# Patient Record
Sex: Female | Born: 1965 | Race: White | Hispanic: No | Marital: Single | Smoking: Never smoker
Health system: Southern US, Community
[De-identification: ages and names within clinical notes are randomized; demographics above are authoritative.]

## PROBLEM LIST (undated history)

## (undated) DIAGNOSIS — G4733 Obstructive sleep apnea (adult) (pediatric): Secondary | ICD-10-CM

## (undated) DIAGNOSIS — G8929 Other chronic pain: Secondary | ICD-10-CM

## (undated) DIAGNOSIS — E119 Type 2 diabetes mellitus without complications: Secondary | ICD-10-CM

## (undated) DIAGNOSIS — I10 Essential (primary) hypertension: Secondary | ICD-10-CM

## (undated) DIAGNOSIS — F319 Bipolar disorder, unspecified: Secondary | ICD-10-CM

## (undated) DIAGNOSIS — F32A Depression, unspecified: Secondary | ICD-10-CM

## (undated) DIAGNOSIS — N200 Calculus of kidney: Secondary | ICD-10-CM

## (undated) DIAGNOSIS — M793 Panniculitis, unspecified: Secondary | ICD-10-CM

## (undated) DIAGNOSIS — E662 Morbid (severe) obesity with alveolar hypoventilation: Secondary | ICD-10-CM

## (undated) DIAGNOSIS — F329 Major depressive disorder, single episode, unspecified: Secondary | ICD-10-CM

## (undated) HISTORY — PX: RIGHT OOPHORECTOMY: SHX2359

## (undated) HISTORY — PX: VENTRAL HERNIA REPAIR: SHX424

## (undated) HISTORY — PX: TRACHEOSTOMY: SUR1362

---

## 2014-01-22 ENCOUNTER — Emergency Department (HOSPITAL_COMMUNITY): Payer: Medicare Other

## 2014-01-22 ENCOUNTER — Other Ambulatory Visit: Payer: Self-pay

## 2014-01-22 ENCOUNTER — Emergency Department (HOSPITAL_COMMUNITY)
Admission: EM | Admit: 2014-01-22 | Discharge: 2014-01-23 | Disposition: A | Discharge status: Rehabilitation Facility | Payer: Medicare Other | Attending: Emergency Medicine | Admitting: Emergency Medicine

## 2014-01-22 ENCOUNTER — Encounter (HOSPITAL_COMMUNITY): Payer: Self-pay | Admitting: Emergency Medicine

## 2014-01-22 DIAGNOSIS — I10 Essential (primary) hypertension: Secondary | ICD-10-CM | POA: Insufficient documentation

## 2014-01-22 DIAGNOSIS — J9600 Acute respiratory failure, unspecified whether with hypoxia or hypercapnia: Secondary | ICD-10-CM | POA: Diagnosis not present

## 2014-01-22 DIAGNOSIS — G8929 Other chronic pain: Secondary | ICD-10-CM | POA: Insufficient documentation

## 2014-01-22 DIAGNOSIS — R059 Cough, unspecified: Secondary | ICD-10-CM | POA: Insufficient documentation

## 2014-01-22 DIAGNOSIS — J9611 Chronic respiratory failure with hypoxia: Secondary | ICD-10-CM

## 2014-01-22 DIAGNOSIS — F319 Bipolar disorder, unspecified: Secondary | ICD-10-CM | POA: Insufficient documentation

## 2014-01-22 DIAGNOSIS — Z79899 Other long term (current) drug therapy: Secondary | ICD-10-CM | POA: Insufficient documentation

## 2014-01-22 DIAGNOSIS — E119 Type 2 diabetes mellitus without complications: Secondary | ICD-10-CM | POA: Insufficient documentation

## 2014-01-22 DIAGNOSIS — Z794 Long term (current) use of insulin: Secondary | ICD-10-CM | POA: Insufficient documentation

## 2014-01-22 DIAGNOSIS — J9602 Acute respiratory failure with hypercapnia: Secondary | ICD-10-CM

## 2014-01-22 DIAGNOSIS — F3289 Other specified depressive episodes: Secondary | ICD-10-CM | POA: Insufficient documentation

## 2014-01-22 DIAGNOSIS — Z885 Allergy status to narcotic agent status: Secondary | ICD-10-CM | POA: Insufficient documentation

## 2014-01-22 DIAGNOSIS — F329 Major depressive disorder, single episode, unspecified: Secondary | ICD-10-CM | POA: Insufficient documentation

## 2014-01-22 DIAGNOSIS — E662 Morbid (severe) obesity with alveolar hypoventilation: Secondary | ICD-10-CM | POA: Insufficient documentation

## 2014-01-22 DIAGNOSIS — Z792 Long term (current) use of antibiotics: Secondary | ICD-10-CM | POA: Insufficient documentation

## 2014-01-22 DIAGNOSIS — Z87442 Personal history of urinary calculi: Secondary | ICD-10-CM | POA: Insufficient documentation

## 2014-01-22 DIAGNOSIS — J9612 Chronic respiratory failure with hypercapnia: Secondary | ICD-10-CM

## 2014-01-22 DIAGNOSIS — J9601 Acute respiratory failure with hypoxia: Secondary | ICD-10-CM

## 2014-01-22 DIAGNOSIS — R4182 Altered mental status, unspecified: Secondary | ICD-10-CM | POA: Insufficient documentation

## 2014-01-22 DIAGNOSIS — R0902 Hypoxemia: Secondary | ICD-10-CM | POA: Insufficient documentation

## 2014-01-22 DIAGNOSIS — G9340 Encephalopathy, unspecified: Secondary | ICD-10-CM | POA: Diagnosis not present

## 2014-01-22 DIAGNOSIS — Z881 Allergy status to other antibiotic agents status: Secondary | ICD-10-CM | POA: Insufficient documentation

## 2014-01-22 DIAGNOSIS — R000 Tachycardia, unspecified: Secondary | ICD-10-CM | POA: Insufficient documentation

## 2014-01-22 DIAGNOSIS — Z430 Encounter for attention to tracheostomy: Secondary | ICD-10-CM | POA: Insufficient documentation

## 2014-01-22 DIAGNOSIS — R062 Wheezing: Secondary | ICD-10-CM | POA: Insufficient documentation

## 2014-01-22 DIAGNOSIS — R05 Cough: Secondary | ICD-10-CM | POA: Insufficient documentation

## 2014-01-22 DIAGNOSIS — G4733 Obstructive sleep apnea (adult) (pediatric): Secondary | ICD-10-CM | POA: Insufficient documentation

## 2014-01-22 DIAGNOSIS — R0602 Shortness of breath: Secondary | ICD-10-CM | POA: Insufficient documentation

## 2014-01-22 DIAGNOSIS — Z7901 Long term (current) use of anticoagulants: Secondary | ICD-10-CM | POA: Insufficient documentation

## 2014-01-22 DIAGNOSIS — M793 Panniculitis, unspecified: Secondary | ICD-10-CM | POA: Insufficient documentation

## 2014-01-22 DIAGNOSIS — R0681 Apnea, not elsewhere classified: Secondary | ICD-10-CM | POA: Insufficient documentation

## 2014-01-22 HISTORY — DX: Other chronic pain: G89.29

## 2014-01-22 HISTORY — DX: Morbid (severe) obesity with alveolar hypoventilation: E66.2

## 2014-01-22 HISTORY — DX: Morbid (severe) obesity due to excess calories: E66.01

## 2014-01-22 HISTORY — DX: Bipolar disorder, unspecified: F31.9

## 2014-01-22 HISTORY — DX: Major depressive disorder, single episode, unspecified: F32.9

## 2014-01-22 HISTORY — DX: Essential (primary) hypertension: I10

## 2014-01-22 HISTORY — DX: Obstructive sleep apnea (adult) (pediatric): G47.33

## 2014-01-22 HISTORY — DX: Depression, unspecified: F32.A

## 2014-01-22 HISTORY — DX: Type 2 diabetes mellitus without complications: E11.9

## 2014-01-22 HISTORY — DX: Panniculitis, unspecified: M79.3

## 2014-01-22 HISTORY — DX: Calculus of kidney: N20.0

## 2014-01-22 LAB — COMPREHENSIVE METABOLIC PANEL
ALK PHOS: 52 U/L (ref 39–117)
ALT: 17 U/L (ref 0–35)
AST: 17 U/L (ref 0–37)
Albumin: 2.5 g/dL — ABNORMAL LOW (ref 3.5–5.2)
BUN: 8 mg/dL (ref 6–23)
CO2: 29 meq/L (ref 19–32)
Calcium: 8.7 mg/dL (ref 8.4–10.5)
Chloride: 96 mEq/L (ref 96–112)
Creatinine, Ser: 0.94 mg/dL (ref 0.50–1.10)
GFR, EST AFRICAN AMERICAN: 81 mL/min — AB (ref >90)
GFR, EST NON AFRICAN AMERICAN: 70 mL/min — AB (ref >90)
GLUCOSE: 223 mg/dL — AB (ref 70–99)
POTASSIUM: 4.2 meq/L (ref 3.7–5.3)
SODIUM: 138 meq/L (ref 137–147)
Total Bilirubin: 0.4 mg/dL (ref 0.3–1.2)
Total Protein: 6.6 g/dL (ref 6.0–8.3)

## 2014-01-22 LAB — CBC WITH DIFFERENTIAL/PLATELET
Basophils Absolute: 0 10*3/uL (ref 0.0–0.1)
Basophils Relative: 0 % (ref 0–1)
Eosinophils Absolute: 0.1 10*3/uL (ref 0.0–0.7)
Eosinophils Relative: 2 % (ref 0–5)
HCT: 37.6 % (ref 36.0–46.0)
Hemoglobin: 11.6 g/dL — ABNORMAL LOW (ref 12.0–15.0)
LYMPHS ABS: 1.6 10*3/uL (ref 0.7–4.0)
Lymphocytes Relative: 19 % (ref 12–46)
MCH: 30 pg (ref 26.0–34.0)
MCHC: 30.9 g/dL (ref 30.0–36.0)
MCV: 97.2 fL (ref 78.0–100.0)
Monocytes Absolute: 1 10*3/uL (ref 0.1–1.0)
Monocytes Relative: 12 % (ref 3–12)
NEUTROS PCT: 67 % (ref 43–77)
Neutro Abs: 5.8 10*3/uL (ref 1.7–7.7)
PLATELETS: 188 10*3/uL (ref 150–400)
RBC: 3.87 MIL/uL (ref 3.87–5.11)
RDW: 18 % — ABNORMAL HIGH (ref 11.5–15.5)
WBC: 8.5 10*3/uL (ref 4.0–10.5)

## 2014-01-22 LAB — I-STAT ARTERIAL BLOOD GAS, ED
ACID-BASE EXCESS: 6 mmol/L — AB (ref 0.0–2.0)
ACID-BASE EXCESS: 10 mmol/L — AB (ref 0.0–2.0)
BICARBONATE: 36.1 meq/L — AB (ref 20.0–24.0)
Bicarbonate: 38 mEq/L — ABNORMAL HIGH (ref 20.0–24.0)
O2 SAT: 100 %
O2 Saturation: 92 %
PO2 ART: 237 mmHg — AB (ref 80.0–100.0)
Patient temperature: 99
TCO2: 39 mmol/L (ref 0–100)
TCO2: 40 mmol/L (ref 0–100)
pCO2 arterial: 85.4 mmHg (ref 35.0–45.0)
pCO2 arterial: 73.8 mmHg (ref 35.0–45.0)
pH, Arterial: 7.236 — ABNORMAL LOW (ref 7.350–7.450)
pH, Arterial: 7.321 — ABNORMAL LOW (ref 7.350–7.450)
pO2, Arterial: 80 mmHg (ref 80.0–100.0)

## 2014-01-22 LAB — CBG MONITORING, ED: Glucose-Capillary: 179 mg/dL — ABNORMAL HIGH (ref 70–99)

## 2014-01-22 NOTE — ED Notes (Signed)
Pt now opening her eyes and is able to shake her head yes and no to questions asked.

## 2014-01-22 NOTE — ED Provider Notes (Signed)
CSN: [id number]     Arrival date & time 01/22/14  1913 History   First MD Initiated Contact with Patient 01/22/14 1923     Chief Complaint  Patient presents with  . Respiratory Distress     (Consider location/radiation/quality/duration/timing/severity/associated sxs/prior Treatment) Patient is a 48 y.o. female presenting with altered mental status. The history is provided by the EMS personnel.  Altered Mental Status Presenting symptoms: lethargy, partial responsiveness and unresponsiveness   Severity:  Severe Most recent episode:  Today Episode history:  Single Duration:  5 minutes Timing:  Constant Progression:  Worsening Chronicity:  New Context comment:  Trach collar, chronic respiratory failure Associated symptoms: difficulty breathing   Associated symptoms: no fever and no vomiting     No past medical history on file. No past surgical history on file. No family history on file. History  Substance Use Topics  . Smoking status: Not on file  . Smokeless tobacco: Not on file  . Alcohol Use: Not on file   OB History   No data available     Review of Systems  Unable to perform ROS: Severe respiratory distress  Constitutional: Negative for fever.  Respiratory: Positive for apnea, cough and shortness of breath.   Gastrointestinal: Negative for vomiting.      Allergies  Review of patient's allergies indicates not on file.  Home Medications   Prior to Admission medications   Not on File   BP 137/83  Pulse 113  Temp(Src) 99 F (37.2 C) (Axillary)  Resp 20  Ht 5\' 2"  (1.575 m)  Wt 550 lb (249.478 kg)  BMI 100.57 kg/m2  SpO2 94% Physical Exam  Constitutional: She appears toxic. She appears distressed.  Obese  HENT:  Head: Normocephalic and atraumatic.  Eyes: Conjunctivae are normal.  Cardiovascular: Regular rhythm.  Tachycardia present.   Pulmonary/Chest: Apnea and bradypnea noted. She is in respiratory distress. She has decreased breath sounds (difficult  to examine). She has wheezes. She has rhonchi.  Abdominal:    Chronic right lower abdominal wound  Neurological: She is unresponsive. GCS eye subscore is 1. GCS verbal subscore is 1. GCS motor subscore is 3.  Skin: There is erythema (overlying chronic wounds).    ED Course  Procedures (including critical care time) Labs Review Labs Reviewed  CBC WITH DIFFERENTIAL - Abnormal; Notable for the following:    Hemoglobin 11.6 (*)    RDW 18.0 (*)    All other components within normal limits  COMPREHENSIVE METABOLIC PANEL - Abnormal; Notable for the following:    Glucose, Bld 223 (*)    Albumin 2.5 (*)    GFR calc non Af Amer 70 (*)    GFR calc Af Amer 81 (*)    All other components within normal limits  CBG MONITORING, ED - Abnormal; Notable for the following:    Glucose-Capillary 179 (*)    All other components within normal limits  I-STAT ARTERIAL BLOOD GAS, ED - Abnormal; Notable for the following:    pH, Arterial 7.236 (*)    pCO2 arterial 85.4 (*)    Bicarbonate 36.1 (*)    Acid-Base Excess 6.0 (*)    All other components within normal limits  I-STAT ARTERIAL BLOOD GAS, ED - Abnormal; Notable for the following:    pH, Arterial 7.321 (*)    pCO2 arterial 73.8 (*)    pO2, Arterial 237.0 (*)    Bicarbonate 38.0 (*)    Acid-Base Excess 10.0 (*)    All other components within  normal limits  BLOOD GAS, ARTERIAL    Imaging Review Dg Chest Portable 1 View  01/22/2014   CLINICAL DATA:  Shortness of breath and hypoxia. Tracheostomy. Morbidly obese.  EXAM: PORTABLE CHEST - 1 VIEW  COMPARISON:  Chest radiograph 01/19/2014  FINDINGS: Image quality limited by portable technique and patient body habitus. Numerous cardiac leads project over chest. Tracheostomy appears stable, with the distal tip beyond the thoracic inlet and well above the carina. Left upper extremity opaque projects over the proximal superior vena cava, near its junction with the left brachycephalic vein.  Cardiomegaly  appears stable. There is diffuse pulmonary vascular congestion. There is interstitial prominence bilaterally, which may be accentuated by radiographic technique. No focal airspace disease or definite pleural effusion. No visible pneumothorax.  IMPRESSION: Image quality limited by portable technique and patient body habitus.  Cardiomegaly can't pulmonary vascular congestion. Cannot exclude mild interstitial pulmonary edema.  Left upper extremity PICC terminates in the proximal superior vena cava.   Electronically Signed   By: Curlene Dolphin M.D.   On: 01/22/2014 20:05     EKG Interpretation None      MDM   Final diagnoses:  Acute respiratory failure with hypoxia and hypercarbia  Altered mental status    48 y.o. female presents en route to long term care facility and while passing through the emergency department went into acute respiratory failure. According to EMS report the Pt was talking and gesturing en route, asked for a napkin and coughed, requested suctioning, then became altered and unresponsive. On initial assessment at bedside Pt effectively apneic, gray, unresponsive, still had palpable pulse. Bagged with undetectable pulse oximetry that first registered at 71% and slowly [place] to 90s, Pt's color improved, persistently altered. CXR without evidence of pneumothorax and possible edema, limited by habitus. Blood gas shows CO2 of 85, Pt placed on rate to increase ventilation. Per transfer documentation Pt was transitioned to trach collar 16 hours ago in preparation for transfer. Possible Pt went into decompensated hypercarbic respiratory failure, had prolonged obstruction of trach, or had other unrelated contributing event. Head CT ordered due to persistent alteration in mental status and for baseline imaging with possibility of anoxic brain injury in setting of unknown duration of apnea. Unable to obtain as Pt will not fit in scanner. Critical care has evaluated Pt in the ED and while ventilating  has had progressively improving mental status. Suspect delayed resolution of transient hypoxic episode. Will discharge to long term care facility where same critical care team will be following along.     Leo Grosser, MD 01/23/14 [id number]  Leo Grosser, MD 01/23/14 [phone]

## 2014-01-22 NOTE — Consult Note (Signed)
PULMONARY / CRITICAL CARE MEDICINE   Name: Kim Jenkins MRN: [id number] DOB: 09/22/1965    ADMISSION DATE:  01/22/2014 CONSULTATION DATE:  12/23/13  REFERRING MD :  EDP PRIMARY SERVICE: [facility]?  CHIEF COMPLAINT:  Hypoxia  BRIEF PATIENT DESCRIPTION: 48 y.o. F with extreme morbid obesity, is trach dependent, was being brought as a transfer to [facility].  Upon arrival to [facility] facility, while passing through the ED halls, pt had mucus plug causing temporary decrease in SpO2 (71%) with brief episode of apnea.  PCCM consulted for recs.  SIGNIFICANT EVENTS / STUDIES:  LINES / TUBES: Trach (? Date) >>> Foley (? Date) >>> Left arm PICC (? Date) >>>  CULTURES: None  ANTIBIOTICS: None  HISTORY OF PRESENT ILLNESS:  Pt is trach dependent, on full vent support currently and unable to communicate, therefore this HPI is obtained from limited ED notes in EPIC system. Kim Jenkins is a 48 y.o. F with extreme morbid obesity and PMH as indicated below.  She was being transferred to [facility] (from [place]?) on 5/7.  Per ED notes, while pt was in ambulance, she asked for a napkin and coughed up some mucus, then indicated to EMS that she needed to be suctioned.  While passing through the [facility] ED halls en route to [facility], she became apneic and unresponsive.  SpO2 at the time was initially 71%, pt was bagged by ED resident and sats then increased into 90's and pt became responsive again.  No loss of pulses throughout episode mentioned.  CXR was obtained and revealed cardiomegaly with possible pulmonary edema, though note that exam limited due to pt body habitus.  Initial ABG  7.23/85/80.  Pt put on full vent support and ABG 2 hours later 7.32/73/237.  Vent adjusted accordingly by RT. Head CT was ordered to r/o anoxic injury; however, pt would not fit in CT scanner. PCCM was asked to evaluate the pt for possible admission/monitoring before moving to [facility]. During my exam, pt responded to verbal stimuli.  Was able to nod her head and  indicated that she was not currently having any difficulty breathing and did not feel like she needed further suctioning.  She denied any pain or other symptoms.  PAST MEDICAL HISTORY :  Past Medical History  Diagnosis Date  . Depression   . Morbid obesity   . Bipolar 1 disorder   . Diabetes mellitus   . Hypertension   . Chronic pain   . Panniculitis   . Obstructive sleep apnea   . Obesity hypoventilation syndrome   . Kidney stones    Past Surgical History  Procedure Laterality Date  . Tracheostomy    . Right oophorectomy    . Ventral hernia repair     Prior to Admission medications   Medication Sig Start Date End Date Taking? Authorizing Provider  albuterol (PROVENTIL HFA;VENTOLIN HFA) 108 (90 BASE) MCG/ACT inhaler Inhale 2 puffs into the lungs every 6 (six) hours as needed for wheezing or shortness of breath.   Yes Historical Provider, MD  buPROPion (WELLBUTRIN) 100 MG tablet Take 100 mg by mouth every morning.   Yes Historical Provider, MD  butalbital-acetaminophen-caffeine (FIORICET, ESGIC) 50-325-40 MG per tablet Take 1 tablet by mouth every 4 (four) hours as needed for headache.   Yes Historical Provider, MD  Calcium Carbonate-Vit D-Min (CALCIUM 600+D PLUS MINERALS) 600-400 MG-UNIT TABS Take 1 tablet by mouth 2 (two) times daily.   Yes Historical Provider, MD  clonazePAM (KLONOPIN) 0.5 MG tablet Take 0.5 mg by  mouth every 12 (twelve) hours.   Yes Historical Provider, MD  cyclobenzaprine (FLEXERIL) 5 MG tablet Take 5 mg by mouth 3 (three) times daily.   Yes Historical Provider, MD  divalproex (DEPAKOTE ER) 250 MG 24 hr tablet Take 250 mg by mouth at bedtime.   Yes Historical Provider, MD  Divalproex Sodium (DEPAKOTE PO) Take 625 mg by mouth 2 (two) times daily.   Yes Historical Provider, MD  doxycycline 100 mg in dextrose 5 % 250 mL Inject 100 mg into the vein every 12 (twelve) hours.   Yes Historical Provider, MD  DULoxetine (CYMBALTA) 30 MG capsule Take 30 mg by mouth 2 (two)  times daily.   Yes Historical Provider, MD  enoxaparin (LOVENOX) 40 MG/0.4ML injection Inject 40 mg into the skin every 12 (twelve) hours.   Yes Historical Provider, MD  famotidine (PEPCID) 40 MG tablet Take 40 mg by mouth at bedtime.   Yes Historical Provider, MD  fluconazole (DIFLUCAN) 200 MG/100ML IVPB Inject 200 mg into the vein daily.   Yes Historical Provider, MD  Fluticasone-Salmeterol (ADVAIR) 250-50 MCG/DOSE AEPB Inhale 1 puff into the lungs 2 (two) times daily.   Yes Historical Provider, MD  gabapentin (NEURONTIN) 600 MG tablet Take 600 mg by mouth 3 (three) times daily.   Yes Historical Provider, MD  insulin detemir (LEVEMIR) 100 UNIT/ML injection Inject 70 Units into the skin 2 (two) times daily.   Yes Historical Provider, MD  loratadine (CLARITIN) 10 MG tablet Take 10 mg by mouth daily as needed for allergies.   Yes Historical Provider, MD  metoprolol tartrate (LOPRESSOR) 25 MG tablet Take 12.5 mg by mouth 2 (two) times daily.   Yes Historical Provider, MD  oxyCODONE (OXY IR/ROXICODONE) 5 MG immediate release tablet Take 5 mg by mouth every 4 (four) hours as needed for moderate pain (for pain level 1>5).   Yes Historical Provider, MD  Oxycodone HCl 10 MG TABS Take 10 mg by mouth every 6 (six) hours as needed (for pain level 5>10).   Yes Historical Provider, MD  polyethylene glycol (MIRALAX / GLYCOLAX) packet Take 17 g by mouth daily.   Yes Historical Provider, MD  promethazine 25 mg in sodium chloride 0.9 % 1,000 mL Inject 12.5 mg into the vein every 6 (six) hours as needed (for nausea).   Yes Historical Provider, MD  senna (SENOKOT) 8.6 MG tablet Take 1 tablet by mouth 2 (two) times daily.   Yes Historical Provider, MD  spironolactone (ALDACTONE) 25 MG tablet Take 25 mg by mouth every morning.   Yes Historical Provider, MD  traZODone (DESYREL) 50 MG tablet Take 75 mg by mouth at bedtime.   Yes Historical Provider, MD  Vancomycin HCl in Dextrose (VANCOCIN HCL IV) Inject 2,000 mg into the  vein every 12 (twelve) hours.   Yes Historical Provider, MD  zolpidem (AMBIEN) 5 MG tablet Take 5 mg by mouth at bedtime as needed for sleep.   Yes Historical Provider, MD   Allergies  Allergen Reactions  . Cefazolin   . Meperidine And Related     FAMILY HISTORY:  No family history on file. SOCIAL HISTORY:  has no tobacco, alcohol, and drug history on file.  REVIEW OF SYSTEMS:   All negative; except for those that are bolded, which indicate positives.  Constitutional: weight loss, gain, night sweats, Fevers, chills, fatigue .  HEENT: headaches, Sore throat, sneezing, nasal congestion, post nasal drip, Difficulty swallowing, Tooth/dental problems, visual complaints visual changes, ear ache CV:  chest  pain,Orthopnea, PND, swelling in lower extremities, dizziness, palpitations, syncope.  GI  heartburn, indigestion, abdominal pain, nausea, vomiting, diarrhea, change in bowel habits, loss of appetite, bloody stools.  Resp: cough, productive: , hemoptysis, dyspnea, chest pain, pleuritic. Skin: rash or itching or icterus GU: dysuria, change in color of urine, urgency or frequency. flank pain, hematuria  MS: joint pain or swelling. decreased range of motion  Psych: change in mood or affect. depression or anxiety.  Neuro: difficulty with speech, weakness, numbness, ataxia    SUBJECTIVE: Informs me that she is not having any SOB, chest pain.  VITAL SIGNS: Temp:  [99 F (37.2 C)] 99 F (37.2 C) (05/07 1924) Pulse Rate:  [86-132] 86 (05/07 2230) Resp:  [0-20] 0 (05/07 2230) BP: (108-158)/(60-89) 109/89 mmHg (05/07 2230) SpO2:  [77 %-100 %] 100 % (05/07 2230) FiO2 (%):  [50 %] 50 % (05/07 2030) Weight:  [240.406 kg (530 lb)] 240.406 kg (530 lb) (05/07 1924) HEMODYNAMICS:   VENTILATOR SETTINGS: Vent Mode:  [-] PRVC FiO2 (%):  [50 %] 50 % Set Rate:  [18 bmp] 18 bmp Vt Set:  [440 mL] 440 mL PEEP:  [5 cmH20] 5 cmH20 Pressure Support:  [10 cmH20] 10 cmH20 Plateau Pressure:  [27 cmH20]  27 cmH20 INTAKE / OUTPUT: Intake/Output   None     PHYSICAL EXAMINATION: General: morbidly obese female, resting in bed, trach on full vent support, in NAD. Neuro: MAE's.  Unable to talk as is trached and on vent.  Nods her head to answer questions. HEENT: [place]/AT. PERRL, sclerae anicteric. #6 Trach. Cardiovascular: RRR though distant, no M/R/G appreciated with respect to body habitus. Lungs: Respirations even on full vent support.  Scattered wheezes.  No obvious rhonchi/rales with respect to body habitus. Abdomen: morbid obesity, BS not heard with respect to body habitus, soft, NT/ND.  Musculoskeletal: No gross deformities, edema and chronic (per pt) erythema to both LE's. Skin: Intact, warm, no rashes.    LABS: -r eviewed by staff MD and updated at 0.25 hours 01/23/14  PULMONARY  Recent Labs Lab 01/22/14 2021 01/22/14 2238  PHART 7.236* 7.321*  PCO2ART 85.4* 73.8*  PO2ART 80.0 237.0*  HCO3 36.1* 38.0*  TCO2 39 40  O2SAT 92.0 100.0    CBC  Recent Labs Lab 01/22/14 2004  HGB 11.6*  HCT 37.6  WBC 8.5  PLT 188    COAGULATION No results found for this basename: INR,  in the last 168 hours  CARDIAC  No results found for this basename: TROPONINI,  in the last 168 hours No results found for this basename: PROBNP,  in the last 168 hours   CHEMISTRY  Recent Labs Lab 01/22/14 2004  NA 138  K 4.2  CL 96  CO2 29  GLUCOSE 223*  BUN 8  CREATININE 0.94  CALCIUM 8.7   Estimated Creatinine Clearance: 144.2 ml/min (by C-G formula based on Cr of 0.94).   LIVER  Recent Labs Lab 01/22/14 2004  AST 17  ALT 17  ALKPHOS 52  BILITOT 0.4  PROT 6.6  ALBUMIN 2.5*     INFECTIOUS No results found for this basename: LATICACIDVEN, PROCALCITON,  in the last 168 hours   ENDOCRINE CBG (last 3)   Recent Labs  01/22/14 2000  GLUCAP 179*         IMAGING x48h  Dg Chest Portable 1 View  01/22/2014   CLINICAL DATA:  Shortness of breath and hypoxia.  Tracheostomy. Morbidly obese.  EXAM: PORTABLE CHEST - 1 VIEW  COMPARISON:  Chest radiograph 01/19/2014  FINDINGS: Image quality limited by portable technique and patient body habitus. Numerous cardiac leads project over chest. Tracheostomy appears stable, with the distal tip beyond the thoracic inlet and well above the carina. Left upper extremity opaque projects over the proximal superior vena cava, near its junction with the left brachycephalic vein.  Cardiomegaly appears stable. There is diffuse pulmonary vascular congestion. There is interstitial prominence bilaterally, which may be accentuated by radiographic technique. No focal airspace disease or definite pleural effusion. No visible pneumothorax.  IMPRESSION: Image quality limited by portable technique and patient body habitus.  Cardiomegaly can't pulmonary vascular congestion. Cannot exclude mild interstitial pulmonary edema.  Left upper extremity PICC terminates in the proximal superior vena cava.   Electronically Signed   By: Curlene Dolphin M.D.   On: 01/22/2014 20:05      ASSESSMENT / PLAN:  Tracheostomy Status - Acute on Chronic Respiratory Failure VDRF s/p mucus plug - per ED notes, pt was initially on trach collar, then switched to full vent support during episode of apnea. Acute on Chronic Respiratory Acidosis with acute encephalopathy- improved on 2nd ABG  Hx of OSA/OHS - hypercarbia noted on ABG x 2, improving.  Recs: - Full vent support, wean as able - goal ATC. However, should never have trach removed in setting of morbid obesity - VAP Bundle. - Trend ABG. - CXR in AM. - Duonebs scheduled q6h. - Albuterol q3h PRN. - Caution with chronic pain meds given concern for depressed respiratory drive. - Pt seems stable for transfer to [facility] - Attending to follow.   Montey Hora, [place] [facility] Pgr: [phone]  or [phone]  01/22/2014, 11:14 PM   STAFF NOTE: I, Dr Ann Lions have  personally reviewed patient's available data, including medical history, events of note, physical examination and test results as part of my evaluation. I have discussed with resident/NP and other care providers such as pharmacist, RN and RRT.  In addition,  I personally evaluated patient and elicited key findings of acute on chronic respiratory failure s/p trach in setting of morbid obesity. Had acute hypercapnia due to mucus plugging and altered mental status in ER. Ultimately resolved with bagging. STable to go to [facility] from ER for chronic critical illness needs; needs do not appear acute.  Rest per NP/medical resident whose note is outlined above and that I agree with  The patient is critically ill with multiple organ systems failure and requires high complexity decision making for assessment and support, frequent evaluation and titration of therapies, application of advanced monitoring technologies and extensive interpretation of multiple databases.   Critical Care Time devoted to patient care services described in this note is  35  Minutes.  Dr. Brand Males, M.D., [facility].C.P Pulmonary and Critical Care Medicine Staff Physician [place] [facility] Pager: [phone], If no answer or between  15:00h - 7:00h: call [phone]  01/23/2014 12:26 AM

## 2014-01-22 NOTE — Progress Notes (Signed)
MD & RN made aware of critical value ABG results (CO2: 85) New orders received to changed pt. To full support.

## 2014-01-22 NOTE — ED Notes (Signed)
Unable to perform CT as pt is too large to fit scanner, Dr. Venora Maples EDP made aware.

## 2014-01-22 NOTE — ED Notes (Signed)
Pt transferred to bariatric hospital bed for comfort.

## 2014-01-22 NOTE — ED Notes (Signed)
Per [facility] - pt was to be a direct admit to this facility, on arrival pt requested suctioning of her tracheostomy - pt experienced a period of unresponsiveness shortly after, RT to bedside for trach suctioning, pt the bagged by MD resident at bedside d/t minimal respiratory effort of pt. Once on cardiac monitor pt noted to have pulse ox of 78% while being bagged - O2 increased to 94% on vent.

## 2014-01-23 ENCOUNTER — Other Ambulatory Visit (HOSPITAL_COMMUNITY): Payer: Medicare Other

## 2014-01-23 ENCOUNTER — Encounter (HOSPITAL_COMMUNITY): Payer: Self-pay | Admitting: Emergency Medicine

## 2014-01-23 ENCOUNTER — Inpatient Hospital Stay
Admission: AD | Admit: 2014-01-23 | Discharge: 2014-02-20 | Disposition: A | Discharge status: Skilled Nursing Facility | Payer: Medicare Other | Source: Ambulatory Visit | Attending: Internal Medicine | Admitting: Internal Medicine

## 2014-01-23 DIAGNOSIS — J9612 Chronic respiratory failure with hypercapnia: Secondary | ICD-10-CM

## 2014-01-23 DIAGNOSIS — J9611 Chronic respiratory failure with hypoxia: Secondary | ICD-10-CM

## 2014-01-23 DIAGNOSIS — G9340 Encephalopathy, unspecified: Secondary | ICD-10-CM

## 2014-01-23 DIAGNOSIS — J9620 Acute and chronic respiratory failure, unspecified whether with hypoxia or hypercapnia: Secondary | ICD-10-CM

## 2014-01-23 DIAGNOSIS — Z930 Tracheostomy status: Secondary | ICD-10-CM

## 2014-01-23 LAB — BLOOD GAS, ARTERIAL
Acid-Base Excess: 9.3 mmol/L — ABNORMAL HIGH (ref 0.0–2.0)
Bicarbonate: 35.6 mEq/L — ABNORMAL HIGH (ref 20.0–24.0)
FIO2: 0.4 %
O2 Saturation: 97 %
PATIENT TEMPERATURE: 98.6
PCO2 ART: 73.7 mmHg — AB (ref 35.0–45.0)
PH ART: 7.305 — AB (ref 7.350–7.450)
TCO2: 37.8 mmol/L (ref 0–100)
pO2, Arterial: 98.4 mmHg (ref 80.0–100.0)

## 2014-01-23 LAB — COMPREHENSIVE METABOLIC PANEL
ALT: 14 U/L (ref 0–35)
AST: 11 U/L (ref 0–37)
Albumin: 2.3 g/dL — ABNORMAL LOW (ref 3.5–5.2)
Alkaline Phosphatase: 47 U/L (ref 39–117)
BILIRUBIN TOTAL: 0.3 mg/dL (ref 0.3–1.2)
BUN: 9 mg/dL (ref 6–23)
CO2: 34 meq/L — AB (ref 19–32)
Calcium: 9 mg/dL (ref 8.4–10.5)
Chloride: 94 mEq/L — ABNORMAL LOW (ref 96–112)
Creatinine, Ser: 0.88 mg/dL (ref 0.50–1.10)
GFR calc Af Amer: 88 mL/min — ABNORMAL LOW (ref >90)
GFR, EST NON AFRICAN AMERICAN: 76 mL/min — AB (ref >90)
Glucose, Bld: 121 mg/dL — ABNORMAL HIGH (ref 70–99)
POTASSIUM: 4.1 meq/L (ref 3.7–5.3)
SODIUM: 140 meq/L (ref 137–147)
Total Protein: 6.3 g/dL (ref 6.0–8.3)

## 2014-01-23 LAB — PHOSPHORUS: PHOSPHORUS: 2.9 mg/dL (ref 2.3–4.6)

## 2014-01-23 LAB — SEDIMENTATION RATE: SED RATE: 75 mm/h — AB (ref 0–22)

## 2014-01-23 LAB — VITAMIN B12: VITAMIN B 12: 632 pg/mL (ref 211–911)

## 2014-01-23 LAB — TSH: TSH: 0.928 u[IU]/mL (ref 0.350–4.500)

## 2014-01-23 LAB — CBC WITH DIFFERENTIAL/PLATELET
BASOS ABS: 0 10*3/uL (ref 0.0–0.1)
Basophils Relative: 0 % (ref 0–1)
Eosinophils Absolute: 0.1 10*3/uL (ref 0.0–0.7)
Eosinophils Relative: 2 % (ref 0–5)
HCT: 34.5 % — ABNORMAL LOW (ref 36.0–46.0)
Hemoglobin: 10.6 g/dL — ABNORMAL LOW (ref 12.0–15.0)
Lymphocytes Relative: 25 % (ref 12–46)
Lymphs Abs: 1.5 10*3/uL (ref 0.7–4.0)
MCH: 29.6 pg (ref 26.0–34.0)
MCHC: 30.7 g/dL (ref 30.0–36.0)
MCV: 96.4 fL (ref 78.0–100.0)
Monocytes Absolute: 0.9 10*3/uL (ref 0.1–1.0)
Monocytes Relative: 15 % — ABNORMAL HIGH (ref 3–12)
Neutro Abs: 3.6 10*3/uL (ref 1.7–7.7)
Neutrophils Relative %: 58 % (ref 43–77)
Platelets: 181 10*3/uL (ref 150–400)
RBC: 3.58 MIL/uL — ABNORMAL LOW (ref 3.87–5.11)
RDW: 17.3 % — AB (ref 11.5–15.5)
WBC: 6.1 10*3/uL (ref 4.0–10.5)

## 2014-01-23 LAB — C-REACTIVE PROTEIN: CRP: 14.3 mg/dL — AB (ref <0.60)

## 2014-01-23 LAB — PREALBUMIN: Prealbumin: 11.1 mg/dL — ABNORMAL LOW (ref 17.0–34.0)

## 2014-01-23 LAB — MAGNESIUM: Magnesium: 1.8 mg/dL (ref 1.5–2.5)

## 2014-01-23 LAB — T4, FREE: Free T4: 0.98 ng/dL (ref 0.80–1.80)

## 2014-01-23 NOTE — ED Notes (Signed)
patient body habitus too large to fit inside scanner

## 2014-01-23 NOTE — ED Provider Notes (Signed)
I saw and evaluated the patient, reviewed the resident's note and I agree with the findings and plan.  CRITICAL CARE Performed by: Hoy Morn Total critical care time: 35 Critical care time was exclusive of separately billable procedures and treating other patients. Critical care was necessary to treat or prevent imminent or life-threatening deterioration. Critical care was time spent personally by me on the following activities: development of treatment plan with patient and/or surrogate as well as nursing, discussions with consultants, evaluation of patient's response to treatment, examination of patient, obtaining history from patient or surrogate, ordering and performing treatments and interventions, ordering and review of laboratory studies, ordering and review of radiographic studies, pulse oximetry and re-evaluation of patient's condition.  Patient presents with acute hypoxic and hypercarbic respiratory failure.  This is likely due to accommodation of obstruction of her trach during transport to the long-term care facility as well as possible mucous plugging.  Vision with profound altered mental status.  Initially did not respond on the ventilator.  When her rate was turned up it seems to have improved her mental status.  Pulmonary critical care was involved and this time they recommend discharge from the emergency department and transferred to [facility] on ventilator. PCCM rounds on these pts and will see her again tomorrow.   Dg Chest Portable 1 View  01/22/2014   CLINICAL DATA:  Shortness of breath and hypoxia. Tracheostomy. Morbidly obese.  EXAM: PORTABLE CHEST - 1 VIEW  COMPARISON:  Chest radiograph 01/19/2014  FINDINGS: Image quality limited by portable technique and patient body habitus. Numerous cardiac leads project over chest. Tracheostomy appears stable, with the distal tip beyond the thoracic inlet and well above the carina. Left upper extremity opaque projects over the  proximal superior vena cava, near its junction with the left brachycephalic vein.  Cardiomegaly appears stable. There is diffuse pulmonary vascular congestion. There is interstitial prominence bilaterally, which may be accentuated by radiographic technique. No focal airspace disease or definite pleural effusion. No visible pneumothorax.  IMPRESSION: Image quality limited by portable technique and patient body habitus.  Cardiomegaly can't pulmonary vascular congestion. Cannot exclude mild interstitial pulmonary edema.  Left upper extremity PICC terminates in the proximal superior vena cava.   Electronically Signed   By: Curlene Dolphin M.D.   On: 01/22/2014 20:05   I personally reviewed the imaging tests through PACS system I reviewed available ER/hospitalization records through the EMR  Results for orders placed during the hospital encounter of 01/22/14  CBC WITH DIFFERENTIAL      Result Value Ref Range   WBC 8.5  4.0 - 10.5 K/uL   RBC 3.87  3.87 - 5.11 MIL/uL   Hemoglobin 11.6 (*) 12.0 - 15.0 g/dL   HCT 37.6  36.0 - 46.0 %   MCV 97.2  78.0 - 100.0 fL   MCH 30.0  26.0 - 34.0 pg   MCHC 30.9  30.0 - 36.0 g/dL   RDW 18.0 (*) 11.5 - 15.5 %   Platelets 188  150 - 400 K/uL   Neutrophils Relative % 67  43 - 77 %   Neutro Abs 5.8  1.7 - 7.7 K/uL   Lymphocytes Relative 19  12 - 46 %   Lymphs Abs 1.6  0.7 - 4.0 K/uL   Monocytes Relative 12  3 - 12 %   Monocytes Absolute 1.0  0.1 - 1.0 K/uL   Eosinophils Relative 2  0 - 5 %   Eosinophils Absolute 0.1  0.0 - 0.7  K/uL   Basophils Relative 0  0 - 1 %   Basophils Absolute 0.0  0.0 - 0.1 K/uL  COMPREHENSIVE METABOLIC PANEL      Result Value Ref Range   Sodium 138  137 - 147 mEq/L   Potassium 4.2  3.7 - 5.3 mEq/L   Chloride 96  96 - 112 mEq/L   CO2 29  19 - 32 mEq/L   Glucose, Bld 223 (*) 70 - 99 mg/dL   BUN 8  6 - 23 mg/dL   Creatinine, Ser 0.94  0.50 - 1.10 mg/dL   Calcium 8.7  8.4 - 10.5 mg/dL   Total Protein 6.6  6.0 - 8.3 g/dL   Albumin 2.5  (*) 3.5 - 5.2 g/dL   AST 17  0 - 37 U/L   ALT 17  0 - 35 U/L   Alkaline Phosphatase 52  39 - 117 U/L   Total Bilirubin 0.4  0.3 - 1.2 mg/dL   GFR calc non Af Amer 70 (*) >90 mL/min   GFR calc Af Amer 81 (*) >90 mL/min  CBG MONITORING, ED      Result Value Ref Range   Glucose-Capillary 179 (*) 70 - 99 mg/dL   Comment 1 Documented in Chart     Comment 2 Notify RN    I-STAT ARTERIAL BLOOD GAS, ED      Result Value Ref Range   pH, Arterial 7.236 (*) 7.350 - 7.450   pCO2 arterial 85.4 (*) 35.0 - 45.0 mmHg   pO2, Arterial 80.0  80.0 - 100.0 mmHg   Bicarbonate 36.1 (*) 20.0 - 24.0 mEq/L   TCO2 39  0 - 100 mmol/L   O2 Saturation 92.0     Acid-Base Excess 6.0 (*) 0.0 - 2.0 mmol/L   Patient temperature 99.0 F     Collection site RADIAL, ALLEN'S TEST ACCEPTABLE     Drawn by RT     Sample type ARTERIAL     Comment NOTIFIED PHYSICIAN    I-STAT ARTERIAL BLOOD GAS, ED      Result Value Ref Range   pH, Arterial 7.321 (*) 7.350 - 7.450   pCO2 arterial 73.8 (*) 35.0 - 45.0 mmHg   pO2, Arterial 237.0 (*) 80.0 - 100.0 mmHg   Bicarbonate 38.0 (*) 20.0 - 24.0 mEq/L   TCO2 40  0 - 100 mmol/L   O2 Saturation 100.0     Acid-Base Excess 10.0 (*) 0.0 - 2.0 mmol/L   Patient temperature 99.0 F     Collection site RADIAL, ALLEN'S TEST ACCEPTABLE     Drawn by RT     Sample type ARTERIAL     Comment NOTIFIED Bowman, MD 01/23/14 [phone]

## 2014-01-24 ENCOUNTER — Other Ambulatory Visit (HOSPITAL_COMMUNITY): Payer: Medicare Other

## 2014-01-24 LAB — VANCOMYCIN, TROUGH
VANCOMYCIN TR: 15 ug/mL (ref 10.0–20.0)
VANCOMYCIN TR: 10.1 ug/mL (ref 10.0–20.0)

## 2014-01-24 LAB — FOLATE RBC: RBC Folate: 1402 ng/mL — ABNORMAL HIGH (ref >280)

## 2014-01-24 LAB — VALPROIC ACID LEVEL: Valproic Acid Lvl: 63.3 ug/mL (ref 50.0–100.0)

## 2014-01-26 LAB — BASIC METABOLIC PANEL
BUN: 11 mg/dL (ref 6–23)
CALCIUM: 8.9 mg/dL (ref 8.4–10.5)
CO2: 38 meq/L — AB (ref 19–32)
CREATININE: 0.8 mg/dL (ref 0.50–1.10)
Chloride: 95 mEq/L — ABNORMAL LOW (ref 96–112)
GFR calc Af Amer: >90 mL/min (ref >90)
GFR, EST NON AFRICAN AMERICAN: 85 mL/min — AB (ref >90)
Glucose, Bld: 136 mg/dL — ABNORMAL HIGH (ref 70–99)
Potassium: 3.7 mEq/L (ref 3.7–5.3)
SODIUM: 144 meq/L (ref 137–147)

## 2014-01-26 NOTE — Progress Notes (Addendum)
[facility]                                                                                              Progress note     Patient Demographics  Kim Jenkins, is a 48 y.o. female  FOY:[id number]  [id number]:[id number]  DOB - 19-Oct-1965  Admit date - 01/23/2014  Admitting Physician Suzanna Obey, MD  Outpatient Primary MD for the patient is No PCP Per Patient  LOS - 3   Chief complaint     Respiratory failure    Obesity    Cellulitis    Diabetes mellitus type 2        Subjective:   Kim Jenkins denies any chest pains, still has chronic shortness of breath, no abdominal pain or diarrhea  Objective:   Vital signs  Temperature 97.1 Heart rate 79 Respiratory rate 19 Blood pressure 117/81 Pulse ox 100%    Exam Awake Alert, Oriented X 3, No new F.N deficits, Normal affect [place].AT,PERRAL Supple Neck,No JVD, No cervical lymphadenopathy appriciated.  Symmetrical Chest wall movement, Good air movement bilaterally, CTAB RRR,No Gallops,Rubs or new Murmurs, No Parasternal Heave +ve B.Sounds, Abd Soft, Non tender, No organomegaly appriciated, No rebound - guarding or rigidity. No Cyanosis, Clubbing + edema, No new Rash or bruise    I&Os 1720/2450 Foley - yes Trach yes  Data Review   CBC  Recent Labs Lab 01/22/14 2004 01/23/14 0600  WBC 8.5 6.1  HGB 11.6* 10.6*  HCT 37.6 34.5*  PLT 188 181  MCV 97.2 96.4  MCH 30.0 29.6  MCHC 30.9 30.7  RDW 18.0* 17.3*  LYMPHSABS 1.6 1.5  MONOABS 1.0 0.9  EOSABS 0.1 0.1  BASOSABS 0.0 0.0    Chemistries   Recent Labs Lab 01/22/14 2004 01/23/14 0600 01/26/14 0500  NA 138 140 144  K 4.2 4.1 3.7  CL 96 94* 95*  CO2 29 34* 38*  GLUCOSE 223* 121* 136*  BUN 8 9 11   CREATININE 0.94 0.88 0.80  CALCIUM 8.7 9.0 8.9  MG  --  1.8  --   AST 17 11  --   ALT 17 14  --   ALKPHOS 52 47  --   BILITOT 0.4 0.3  --     Coagulation profile No results  found for this basename: INR, PROTIME,  in the last 168 hours  No results found for this basename: DDIMER,  in the last 72 hours  Cardiac Enzymes No results found for this basename: CK, CKMB, TROPONINI, MYOGLOBIN,  in the last 168 hours ------------------------------------------------------------------------------------------------------------------ No components found with this basename: POCBNP,   Micro Results No results found for this or any previous visit (from the past 240 hour(s)).     Assessment & Plan   Respiratory failure; ATC in a.m. and ventilator at night tolerating PMV Healthcare associated pneumonia continue with IV vancomycin/doxycycline Abdominal also let this with panniculitis and erosive candidiasis on vancomycin doxycycline and fluconazole Obstructive sleep apnea status post trach Chronic pain controlled Diabetes mellitus type 2 Congestive heart failure compensated Hypertension controlled Bipolar disorder continue with trazodone , Depakote, Cymbalta and bupropion .  Generalized weakness continue with PT OT as tolerated Wounds continue with wound care team  Code Status: Full    DVT Prophylaxis  Lovenox    Merton Border M.D on 01/26/2014 at 9:58 AM

## 2014-01-27 ENCOUNTER — Other Ambulatory Visit (HOSPITAL_COMMUNITY): Payer: Medicare Other

## 2014-01-27 DIAGNOSIS — G9340 Encephalopathy, unspecified: Secondary | ICD-10-CM

## 2014-01-27 DIAGNOSIS — J9620 Acute and chronic respiratory failure, unspecified whether with hypoxia or hypercapnia: Secondary | ICD-10-CM

## 2014-01-27 LAB — CBC
HCT: 38.6 % (ref 36.0–46.0)
Hemoglobin: 11.5 g/dL — ABNORMAL LOW (ref 12.0–15.0)
MCH: 29.6 pg (ref 26.0–34.0)
MCHC: 29.8 g/dL — ABNORMAL LOW (ref 30.0–36.0)
MCV: 99.5 fL (ref 78.0–100.0)
PLATELETS: 237 10*3/uL (ref 150–400)
RBC: 3.88 MIL/uL (ref 3.87–5.11)
RDW: 17.1 % — AB (ref 11.5–15.5)
WBC: 8.3 10*3/uL (ref 4.0–10.5)

## 2014-01-27 LAB — BLOOD GAS, ARTERIAL
Acid-Base Excess: 18.1 mmol/L — ABNORMAL HIGH (ref 0.0–2.0)
Bicarbonate: 44.5 mEq/L — ABNORMAL HIGH (ref 20.0–24.0)
FIO2: 0.4 %
O2 Saturation: 92.4 %
PCO2 ART: 81.2 mmHg — AB (ref 35.0–45.0)
PH ART: 7.358 (ref 7.350–7.450)
PO2 ART: 65.5 mmHg — AB (ref 80.0–100.0)
Patient temperature: 98.6
TCO2: 47 mmol/L (ref 0–100)

## 2014-01-27 NOTE — Consult Note (Signed)
Name: Kim Jenkins MRN: [id number] DOB: [birth year]    ADMISSION DATE:  01/23/2014 CONSULTATION DATE:  5/12  REFERRING MD :  Laren Everts  PRIMARY SERVICE:  [name]   CHIEF COMPLAINT:  Trach status/ vent weaning   BRIEF PATIENT DESCRIPTION:  This is a 48 year old female w/ sig h/o Super Morbid Obesity, OSA, OHS, and DM. Transferred to [facility] hospital from SNF initially on 5/5 for evaluation of leg pain after fall. Films neg for fracture but while in ER noted to be hypoxic and hypercarbic after treatment for pain, requiring mechanical ventilation. Admitted to ICU. Further eval demonstrated severe panniculitis involving RLQ of abd, cutaneous candiasis and MRSA tracheobronchitis. Was admitted to the intensive care and treated for above. Attempts were made to liberate her from Vent to 24hr trach collar but pt was found to be hypercarbic and hypersomnolent. Her narcotic and anxiolytic regimen was reduced, her tracheobronchitis was treated and she was transferred to [facility] in hopes that with time she could again be weaned off vent. PCCM has been consulted to assist with this.   SIGNIFICANT EVENTS / STUDIES:  5/12- remains onvent  LINES / TUBES:   CULTURES: sputum 5/6: MRSA   ANTIBIOTICS: Doxy started at [facility]   HISTORY OF PRESENT ILLNESS:  See above   PAST MEDICAL HISTORY :  Past Medical History  Diagnosis Date  . Depression   . Morbid obesity   . Bipolar 1 disorder   . Diabetes mellitus   . Hypertension   . Chronic pain   . Panniculitis   . Obstructive sleep apnea   . Obesity hypoventilation syndrome   . Kidney stones    Past Surgical History  Procedure Laterality Date  . Tracheostomy    . Right oophorectomy    . Ventral hernia repair     Prior to Admission medications   Medication Sig Start Date End Date Taking? Authorizing Provider  albuterol (PROVENTIL HFA;VENTOLIN HFA) 108 (90 BASE) MCG/ACT inhaler Inhale 2 puffs into the lungs every 6 (six) hours as needed for wheezing or shortness of  breath.    Historical Provider, MD  buPROPion (WELLBUTRIN) 100 MG tablet Take 100 mg by mouth every morning.    Historical Provider, MD  butalbital-acetaminophen-caffeine (FIORICET, ESGIC) 50-325-40 MG per tablet Take 1 tablet by mouth every 4 (four) hours as needed for headache.    Historical Provider, MD  Calcium Carbonate-Vit D-Min (CALCIUM 600+D PLUS MINERALS) 600-400 MG-UNIT TABS Take 1 tablet by mouth 2 (two) times daily.    Historical Provider, MD  clonazePAM (KLONOPIN) 0.5 MG tablet Take 0.5 mg by mouth every 12 (twelve) hours.    Historical Provider, MD  cyclobenzaprine (FLEXERIL) 5 MG tablet Take 5 mg by mouth 3 (three) times daily.    Historical Provider, MD  divalproex (DEPAKOTE ER) 250 MG 24 hr tablet Take 250 mg by mouth at bedtime.    Historical Provider, MD  Divalproex Sodium (DEPAKOTE PO) Take 625 mg by mouth 2 (two) times daily.    Historical Provider, MD  doxycycline 100 mg in dextrose 5 % 250 mL Inject 100 mg into the vein every 12 (twelve) hours.    Historical Provider, MD  DULoxetine (CYMBALTA) 30 MG capsule Take 30 mg by mouth 2 (two) times daily.    Historical Provider, MD  enoxaparin (LOVENOX) 40 MG/0.4ML injection Inject 40 mg into the skin every 12 (twelve) hours.    Historical Provider, MD  famotidine (PEPCID) 40 MG tablet Take 40 mg by mouth at bedtime.  Historical Provider, MD  fluconazole (DIFLUCAN) 200 MG/100ML IVPB Inject 200 mg into the vein daily.    Historical Provider, MD  Fluticasone-Salmeterol (ADVAIR) 250-50 MCG/DOSE AEPB Inhale 1 puff into the lungs 2 (two) times daily.    Historical Provider, MD  gabapentin (NEURONTIN) 600 MG tablet Take 600 mg by mouth 3 (three) times daily.    Historical Provider, MD  insulin detemir (LEVEMIR) 100 UNIT/ML injection Inject 70 Units into the skin 2 (two) times daily.    Historical Provider, MD  loratadine (CLARITIN) 10 MG tablet Take 10 mg by mouth daily as needed for allergies.    Historical Provider, MD  metoprolol  tartrate (LOPRESSOR) 25 MG tablet Take 12.5 mg by mouth 2 (two) times daily.    Historical Provider, MD  oxyCODONE (OXY IR/ROXICODONE) 5 MG immediate release tablet Take 5 mg by mouth every 4 (four) hours as needed for moderate pain (for pain level 1>5).    Historical Provider, MD  Oxycodone HCl 10 MG TABS Take 10 mg by mouth every 6 (six) hours as needed (for pain level 5>10).    Historical Provider, MD  polyethylene glycol (MIRALAX / GLYCOLAX) packet Take 17 g by mouth daily.    Historical Provider, MD  promethazine 25 mg in sodium chloride 0.9 % 1,000 mL Inject 12.5 mg into the vein every 6 (six) hours as needed (for nausea).    Historical Provider, MD  senna (SENOKOT) 8.6 MG tablet Take 1 tablet by mouth 2 (two) times daily.    Historical Provider, MD  spironolactone (ALDACTONE) 25 MG tablet Take 25 mg by mouth every morning.    Historical Provider, MD  traZODone (DESYREL) 50 MG tablet Take 75 mg by mouth at bedtime.    Historical Provider, MD  Vancomycin HCl in Dextrose (VANCOCIN HCL IV) Inject 2,000 mg into the vein every 12 (twelve) hours.    Historical Provider, MD  zolpidem (AMBIEN) 5 MG tablet Take 5 mg by mouth at bedtime as needed for sleep.    Historical Provider, MD   Allergies  Allergen Reactions  . Cefazolin   . Meperidine And Related     FAMILY HISTORY:  No family history on file. SOCIAL HISTORY:  has no tobacco, alcohol, and drug history on file.  REVIEW OF SYSTEMS:   Constitutional: Negative for fever, chills, weight loss, malaise/fatigue and diaphoresis.  HENT: Negative for hearing loss, ear pain, nosebleeds, congestion, sore throat, neck pain, tinnitus and ear discharge.   Eyes: Negative for blurred vision, double vision, photophobia, pain, discharge and redness.  Respiratory: Negative for cough, hemoptysis, sputum production, shortness of breath, wheezing and stridor.   Cardiovascular: Negative for chest pain, palpitations, orthopnea, claudication, leg swelling and  PND.  Gastrointestinal: Negative for heartburn, nausea, vomiting, abdominal pain, diarrhea, constipation, blood in stool and melena.  Genitourinary: Negative for dysuria, urgency, frequency, hematuria and flank pain.  Musculoskeletal: Negative for myalgias, back pain, joint pain and falls.  Skin: Negative for itching and rash.  Neurological: Negative for dizziness, tingling, tremors, sensory change, speech change, focal weakness, seizures, loss of consciousness, weakness and headaches.  Endo/Heme/Allergies: Negative for environmental allergies and polydipsia. Does not bruise/bleed easily.  SUBJECTIVE:  No distress.  VITAL SIGNS:  reviewed   PHYSICAL EXAMINATION: General:  MO white female, fully awake and in no distress on PMV  Neuro:  Awake, alert, no focal def  HEENT:  Trach w/ PMV Cardiovascular:  rrr distant  Lungs:  Decreased w/out accessory muscle use Abdomen:  Massive pannus erythremic  and discolored  Musculoskeletal:  Generalized weakness    Recent Labs Lab 01/22/14 2004 01/23/14 0600 01/26/14 0500  NA 138 140 144  K 4.2 4.1 3.7  CL 96 94* 95*  CO2 29 34* 38*  BUN 8 9 11   CREATININE 0.94 0.88 0.80  GLUCOSE 223* 121* 136*    Recent Labs Lab 01/22/14 2004 01/23/14 0600 01/27/14 0600  HGB 11.6* 10.6* 11.5*  HCT 37.6 34.5* 38.6  WBC 8.5 6.1 8.3  PLT 188 181 237   ABG    Component Value Date/Time   PHART 7.305* 01/23/2014 0850   PCO2ART 73.7* 01/23/2014 0850   PO2ART 98.4 01/23/2014 0850   HCO3 35.6* 01/23/2014 0850   TCO2 37.8 01/23/2014 0850   O2SAT 97.0 01/23/2014 0850    Dg Chest Port 1 View  01/27/2014   CLINICAL DATA:  Respiratory failure  EXAM: PORTABLE CHEST - 1 VIEW  COMPARISON:  Jan 24, 2014  FINDINGS: Tracheostomy present with tip 7.1 cm above the carina. Central catheter tip is at the cavoatrial junction. No pneumothorax. There is underlying emphysematous change. Subsegmental atelectasis in the bases remains. There is no appreciable edema or consolidation.  There appears to be underlying emphysematous change. Heart is enlarged with normal pulmonary vascularity, stable.  IMPRESSION: Tube and catheter positions as described without pneumothorax. Underlying emphysema. Stable cardiomegaly. Mild bibasilar atelectatic change without edema or consolidation apparent.   Electronically Signed   By: Lowella Grip M.D.   On: 01/27/2014 08:04    ASSESSMENT / PLAN: Chronic respiratory failure in setting of Super-obesity and OHS OSA MRSA tracheobronchitis Chronic tracheostomy status Panniculitis cutaneous candiasis  DM type II HTN Bipolar disease Chronic pain  AOCD  Discussion She looks very much at baseline from symptom burden. Very comfortable even w/ PCO2 in 70s. Given her body habitus would be hard to imagine that she did not qualify for nocturnal ventilation prior to this acute illness. The variables of acute tracheobronchitis and excess sedating medications could certainly contribute to acute on chronic deterioration, so reasonable to treat these. However we are concerned that this is likely a more chronic issue.   Rec -cont weaning protocol -limit sedating meds -mobilize as able -will need to again trial 24 hr ATC w/ am ABG checks. Would get ABG on initial day off vent and then again at 72 hrs if initial ABG does not demonstrate hypercarbia. -Suspect that she will qualify for nocturnal ventilation.  -would have low threshold to begin looking for vent/trach facility.  -lasix required to neg balance, follow up LLL on pcxr as well, haziness, likely related to body habitus  Pulmonary and [place] Pager: [phone]  01/27/2014, 9:27 AM  I have fully examined this patient and agree with above findings.    And edited infull  Lavon Paganini. Titus Mould, MD, [place] Pgr: [place] [facility]

## 2014-01-27 NOTE — Progress Notes (Signed)
[facility]                                                                                              Progress note     Patient Demographics  Kim Jenkins, is a 48 y.o. female  WJX:[id number]  [id number]:[id number]  DOB - [birth year]  Admit date - 01/23/2014  Admitting Physician Suzanna Obey, MD  Outpatient Primary MD for the patient is No PCP Per Patient  LOS - 4   Chief complaint     Respiratory failure    Obesity    Cellulitis    Diabetes mellitus type 2        Subjective:   Kim Jenkins denies any chest pains, or shortness of breath, her main complaint is constipation.  Objective:   Vital signs  Temperature 97.6 Heart rate 83 Respiratory rate 15 Blood pressure 112/56 Pulse ox 100%    Exam Awake Alert, Oriented X 3, No new F.N deficits, Normal affect [place].AT,PERRAL Supple Neck,No JVD, No cervical lymphadenopathy appriciated.  Tracheostomy midline Symmetrical Chest wall movement, decreased breath sounds bilaterally RRR,No Gallops,Rubs or new Murmurs, No Parasternal Heave +ve B.Sounds, Abd Soft, Non tender, No organomegaly appriciated, No rebound - guarding or rigidity. No Cyanosis, Clubbing + edema, No new Rash or bruise    I&Os unknown Foley - yes Trach yes  Data Review   CBC  Recent Labs Lab 01/22/14 2004 01/23/14 0600 01/27/14 0600  WBC 8.5 6.1 8.3  HGB 11.6* 10.6* 11.5*  HCT 37.6 34.5* 38.6  PLT 188 181 237  MCV 97.2 96.4 99.5  MCH 30.0 29.6 29.6  MCHC 30.9 30.7 29.8*  RDW 18.0* 17.3* 17.1*  LYMPHSABS 1.6 1.5  --   MONOABS 1.0 0.9  --   EOSABS 0.1 0.1  --   BASOSABS 0.0 0.0  --     Chemistries   Recent Labs Lab 01/22/14 2004 01/23/14 0600 01/26/14 0500  NA 138 140 144  K 4.2 4.1 3.7  CL 96 94* 95*  CO2 29 34* 38*  GLUCOSE 223* 121* 136*  BUN 8 9 11   CREATININE 0.94 0.88 0.80  CALCIUM 8.7 9.0 8.9  MG  --  1.8  --   AST 17 11  --   ALT 17 14   --   ALKPHOS 52 47  --   BILITOT 0.4 0.3  --     Coagulation profile No results found for this basename: INR, PROTIME,  in the last 168 hours  No results found for this basename: DDIMER,  in the last 72 hours  Cardiac Enzymes No results found for this basename: CK, CKMB, TROPONINI, MYOGLOBIN,  in the last 168 hours ------------------------------------------------------------------------------------------------------------------ No components found with this basename: POCBNP,   Micro Results No results found for this or any previous visit (from the past 240 hour(s)).     Assessment & Plan   Respiratory failure; ATC in a.m. and ventilator at night, tolerating PMV Healthcare associated pneumonia continue with IV vancomycin/doxycycline Abdominal also let this with panniculitis and erosive candidiasis on vancomycin doxycycline and fluconazole Obstructive sleep apnea status post trach Chronic pain  controlled Diabetes mellitus type 2 Congestive heart failure compensated Hypertension controlled Bipolar disorder continue with trazodone , Depakote, Cymbalta and bupropion . Generalized weakness continue with PT OT as tolerated Wounds continue with wound care team  Code Status: Full    DVT Prophylaxis  Lovenox    Merton Border M.D on 01/27/2014 at 9:31 AM

## 2014-01-28 ENCOUNTER — Other Ambulatory Visit (HOSPITAL_COMMUNITY): Payer: Medicare Other

## 2014-01-28 LAB — BLOOD GAS, ARTERIAL
ACID-BASE EXCESS: 15.3 mmol/L — AB (ref 0.0–2.0)
Bicarbonate: 41.2 mEq/L — ABNORMAL HIGH (ref 20.0–24.0)
FIO2: 0.4 %
O2 Saturation: 97.5 %
PCO2 ART: 71.9 mmHg — AB (ref 35.0–45.0)
PEEP: 5 cmH2O
PO2 ART: 92.8 mmHg (ref 80.0–100.0)
Patient temperature: 98.6
RATE: 15 resp/min
TCO2: 43.5 mmol/L (ref 0–100)
VT: 500 mL
pH, Arterial: 7.377 (ref 7.350–7.450)

## 2014-01-28 NOTE — Progress Notes (Signed)
[facility]                                                                                              Progress note     Patient Demographics  Kim Jenkins, is a 48 y.o. female  YNW:[id number]  [id number]  DOB - 08-15-1966  Admit date - 01/23/2014  Admitting Physician Suzanna Obey, MD  Outpatient Primary MD for the patient is No PCP Per Patient  LOS - 5   Chief complaint     Respiratory failure    Obesity    Cellulitis    Diabetes mellitus type 2        Subjective:   Kim Jenkins episode of yesterday noted. Patient was placed on the ventilator again for the whole night. Still confused and still complains of constipation. No reports of chest pains or   Objective:   Vital signs  Temperature 98.1 Heart rate 90 Respiratory rate 20 Blood pressure 119/74 Pulse ox 90 %    Exam Awake Alert, confused, No new F.N deficits, Normal affect [place].AT,PERRAL, bone facies Supple Neck,No JVD, No cervical lymphadenopathy appriciated.  Tracheostomy midline Symmetrical Chest wall movement, decreased breath sounds bilaterally no rhonchi or wheezing RRR,No Gallops,Rubs or new Murmurs, No Parasternal Heave +ve B.Sounds, Abd Soft, Non tender, No organomegaly appriciated, No rebound - guarding or rigidity. No Cyanosis, Clubbing + edema, No new Rash or bruise    I&Os 1660/2150 Foley - yes Trach yes  Data Review   CBC  Recent Labs Lab 01/22/14 2004 01/23/14 0600 01/27/14 0600  WBC 8.5 6.1 8.3  HGB 11.6* 10.6* 11.5*  HCT 37.6 34.5* 38.6  PLT 188 181 237  MCV 97.2 96.4 99.5  MCH 30.0 29.6 29.6  MCHC 30.9 30.7 29.8*  RDW 18.0* 17.3* 17.1*  LYMPHSABS 1.6 1.5  --   MONOABS 1.0 0.9  --   EOSABS 0.1 0.1  --   BASOSABS 0.0 0.0  --     Chemistries   Recent Labs Lab 01/22/14 2004 01/23/14 0600 01/26/14 0500  NA 138 140 144  K 4.2 4.1 3.7  CL 96 94* 95*  CO2 29 34* 38*  GLUCOSE  223* 121* 136*  BUN 8 9 11   CREATININE 0.94 0.88 0.80  CALCIUM 8.7 9.0 8.9  MG  --  1.8  --   AST 17 11  --   ALT 17 14  --   ALKPHOS 52 47  --   BILITOT 0.4 0.3  --     Coagulation profile No results found for this basename: INR, PROTIME,  in the last 168 hours  No results found for this basename: DDIMER,  in the last 72 hours  Cardiac Enzymes No results found for this basename: CK, CKMB, TROPONINI, MYOGLOBIN,  in the last 168 hours ------------------------------------------------------------------------------------------------------------------ No components found with this basename: POCBNP,   Micro Results No results found for this or any previous visit (from the past 240 hour(s)).     Assessment & Plan   Respiratory failure; ATC in a.m. and ventilator at night, tolerating PMV. Necessitated mode of ventilation yesterday Healthcare associated pneumonia  continue with IV vancomycin/doxycycline Abdominal also let this with panniculitis and erosive candidiasis on vancomycin doxycycline and fluconazole Obstructive sleep apnea status post trach Chronic pain controlled Diabetes mellitus type 2 Congestive heart failure compensated Hypertension controlled Bipolar disorder continue with trazodone , Depakote, Cymbalta and bupropion . Generalized weakness continue with PT OT as tolerated Wounds continue with wound care team  Plan  Check labs in a.m. Check ABGs today  Code Status: Full    DVT Prophylaxis  Lovenox    Merton Border M.D on 01/28/2014 at 10:18 AM

## 2014-01-29 ENCOUNTER — Institutional Professional Consult (permissible substitution) (HOSPITAL_COMMUNITY): Payer: Medicare Other

## 2014-01-29 ENCOUNTER — Other Ambulatory Visit (HOSPITAL_COMMUNITY): Payer: Medicare Other

## 2014-01-29 LAB — CBC
HEMATOCRIT: 36.3 % (ref 36.0–46.0)
Hemoglobin: 11.5 g/dL — ABNORMAL LOW (ref 12.0–15.0)
MCH: 30.4 pg (ref 26.0–34.0)
MCHC: 31.7 g/dL (ref 30.0–36.0)
MCV: 96 fL (ref 78.0–100.0)
Platelets: 230 10*3/uL (ref 150–400)
RBC: 3.78 MIL/uL — AB (ref 3.87–5.11)
RDW: 16.2 % — ABNORMAL HIGH (ref 11.5–15.5)
WBC: 7.5 10*3/uL (ref 4.0–10.5)

## 2014-01-29 LAB — BASIC METABOLIC PANEL
BUN: 19 mg/dL (ref 6–23)
CHLORIDE: 89 meq/L — AB (ref 96–112)
CO2: 38 meq/L — AB (ref 19–32)
CREATININE: 0.78 mg/dL (ref 0.50–1.10)
Calcium: 9.8 mg/dL (ref 8.4–10.5)
GFR calc Af Amer: >90 mL/min (ref >90)
GFR calc non Af Amer: >90 mL/min (ref >90)
Glucose, Bld: 263 mg/dL — ABNORMAL HIGH (ref 70–99)
Potassium: 3.8 mEq/L (ref 3.7–5.3)
Sodium: 141 mEq/L (ref 137–147)

## 2014-01-29 NOTE — Progress Notes (Addendum)
[facility]                                                                                              Progress note     Patient Demographics  Kim Jenkins, is a 48 y.o. female  [id number]  [id number]  DOB - 08/08/1966  Admit date - 01/23/2014  Admitting Physician Suzanna Obey, MD  Outpatient Primary MD for the patient is No PCP Per Patient  LOS - 6   Chief complaint     Respiratory failure    Obesity    Cellulitis    Diabetes mellitus type 2        Subjective:   [name] episode of yesterday noted. Patient was placed on the ventilator again for the whole night. Still confused and still complains of constipation. No reports of chest pains or   Objective:   Vital signs  Temperature 98.1 Heart rate 93 Respiratory rate 15 Blood pressure 119/74 Pulse ox 96 %    Exam Awake Alert, confused, No new F.N deficits, Normal affect [place].AT,PERRAL, bone facies Supple Neck,No JVD, No cervical lymphadenopathy appriciated.  Tracheostomy midline Symmetrical Chest wall movement, decreased breath sounds bilaterally scattered rhonchi RRR,No Gallops,Rubs or new Murmurs, No Parasternal Heave +ve B.Sounds, Abd Soft, Non tender, No organomegaly appriciated, No rebound - guarding or rigidity. No Cyanosis, Clubbing + edema, No new Rash or bruise    I&Os 1550/1650 Foley - yes Trach yes  Data Review   CBC  Recent Labs Lab 01/22/14 2004 01/23/14 0600 01/27/14 0600 01/29/14 0630  WBC 8.5 6.1 8.3 7.5  HGB 11.6* 10.6* 11.5* 11.5*  HCT 37.6 34.5* 38.6 36.3  PLT 188 181 237 230  MCV 97.2 96.4 99.5 96.0  MCH 30.0 29.6 29.6 30.4  MCHC 30.9 30.7 29.8* 31.7  RDW 18.0* 17.3* 17.1* 16.2*  LYMPHSABS 1.6 1.5  --   --   MONOABS 1.0 0.9  --   --   EOSABS 0.1 0.1  --   --   BASOSABS 0.0 0.0  --   --     Chemistries   Recent Labs Lab 01/22/14 2004 01/23/14 0600 01/26/14 0500  01/29/14 0630  NA 138 140 144 141  K 4.2 4.1 3.7 3.8  CL 96 94* 95* 89*  CO2 29 34* 38* 38*  GLUCOSE 223* 121* 136* 263*  BUN 8 9 11 19   CREATININE 0.94 0.88 0.80 0.78  CALCIUM 8.7 9.0 8.9 9.8  MG  --  1.8  --   --   AST 17 11  --   --   ALT 17 14  --   --   ALKPHOS 52 47  --   --   BILITOT 0.4 0.3  --   --     Coagulation profile No results found for this basename: INR, PROTIME,  in the last 168 hours  No results found for this basename: DDIMER,  in the last 72 hours  Cardiac Enzymes No results found for this basename: CK, CKMB, TROPONINI, MYOGLOBIN,  in the last 168 hours ------------------------------------------------------------------------------------------------------------------ No components found with this basename: POCBNP,  Micro Results No results found for this or any previous visit (from the past 240 hour(s)).     Assessment & Plan   Respiratory failure; ATC in a.m. and ventilator at night, tolerating PMV.  Healthcare associated pneumonia continue with IV vancomycin/doxycycline Abdominal also let this with panniculitis and erosive candidiasis on vancomycin doxycycline and fluconazole Obstructive sleep apnea status post trach Chronic pain controlled Diabetes mellitus type 2 Congestive heart failure continue with Lasix and increase dose Hypertension controlled Bipolar disorder continue with trazodone , Depakote, Cymbalta and bupropion . Generalized weakness continue with PT OT as tolerated Wounds continue with wound care team Protein calorie malnutrition; patient is not meeting her caloric needs, NG tube to be inserted  Plan  Inserts NG tube Tube feeding per dietitian Increase Lasix to 40 twice a day Fluid restriction Critical-care time 33 minutes  Code Status: Full    DVT Prophylaxis  Lovenox    Merton Border M.D on 01/29/2014 at 10:18 AM

## 2014-01-30 NOTE — Progress Notes (Signed)
[facility]                                                                                              Progress note     Patient Demographics  Kim Jenkins, is a 48 y.o. female  YPP:[id number]  [id number]  DOB - [birth year]  Admit date - 01/23/2014  Admitting Physician Suzanna Obey, MD  Outpatient Primary MD for the patient is No PCP Per Patient  LOS - 7   Chief complaint     Respiratory failure    Obesity    Cellulitis    Diabetes mellitus type 2        Subjective:   Kim Jenkins episode of yesterday noted. Patient was placed on the ventilator again for the whole night. Still confused and still complains of constipation. No reports of chest pains or   Objective:   Vital signs  Temperature 98.0 Heart rate 99 Respiratory rate 19 Blood pressure 113/64 Pulse ox 100 %    Exam Awake Alert, confused, No new F.N deficits, Normal affect [place].AT,PERRAL, bone facies Supple Neck,No JVD, No cervical lymphadenopathy appriciated.  Tracheostomy midline Symmetrical Chest wall movement, decreased breath sounds bilaterally scattered rhonchi RRR,No Gallops,Rubs or new Murmurs, No Parasternal Heave +ve B.Sounds, Abd Soft, Non tender, No organomegaly appriciated, No rebound - guarding or rigidity. No Cyanosis, Clubbing + edema, No new Rash or bruise    I&Os 1826/1200 Foley - yes Trach #6 XLT  Data Review   CBC  Recent Labs Lab 01/27/14 0600 01/29/14 0630  WBC 8.3 7.5  HGB 11.5* 11.5*  HCT 38.6 36.3  PLT 237 230  MCV 99.5 96.0  MCH 29.6 30.4  MCHC 29.8* 31.7  RDW 17.1* 16.2*    Chemistries   Recent Labs Lab 01/26/14 0500 01/29/14 0630  NA 144 141  K 3.7 3.8  CL 95* 89*  CO2 38* 38*  GLUCOSE 136* 263*  BUN 11 19  CREATININE 0.80 0.78  CALCIUM 8.9 9.8    Coagulation profile No results found for this basename: INR, PROTIME,  in the last 168 hours  No results found  for this basename: DDIMER,  in the last 72 hours  Cardiac Enzymes No results found for this basename: CK, CKMB, TROPONINI, MYOGLOBIN,  in the last 168 hours ------------------------------------------------------------------------------------------------------------------ No components found with this basename: POCBNP,   Micro Results No results found for this or any previous visit (from the past 240 hour(s)).     Assessment & Plan   Respiratory failure; ATC in a.m. and ventilator at night, tolerating PMV.  Healthcare associated pneumonia continue with PO Doxycycline Abdominal cellulitis with panniculitis and erosive candidiasis on doxycycline and fluconazole Obstructive sleep apnea status post trach Chronic pain controlled Diabetes mellitus type 2 continue Levemir and insulin sliding scale Congestive heart failure continue with Lasix  Hypertension controlled Bipolar disorder continue with trazodone , Depakote, Cymbalta and bupropion . Generalized weakness continue with PT OT as tolerated Wounds continue with wound care team Protein calorie malnutrition; continue with NG tube feeding Agitation start morphine/Versed when necessary/fentanyl when necessary  Plan  Start morphine Versed when  necessary Fentanyl when necessary Change doxycycline to by mouth    Code Status: Full    DVT Prophylaxis  Lovenox    Merton Border M.D on 01/30/2014 at 10:10 AM

## 2014-01-31 NOTE — Progress Notes (Addendum)
[facility]                                                                                              Progress note     Patient Demographics  Kim Jenkins, is a 48 y.o. female  [id number]:[id number]  [id number]  DOB - 1966-01-29  Admit date - 01/23/2014  Admitting Physician Suzanna Obey, MD  Outpatient Primary MD for the patient is No PCP Per Patient  LOS - 8   Chief complaint     Respiratory failure    Obesity    Cellulitis    Diabetes mellitus type 2        Subjective:   Kim Jenkins episode of yesterday noted. Patient was placed on the ventilator again for the whole night. Still confused and still complains of constipation. No reports of chest pains or   Objective:   Vital signs  Temperature 7.9 Heart rate 80 Respiratory rate 19 Blood pressure 127/70 Pulse ox 100 %  Exam Awake Alert, confused, No new F.N deficits, Normal affect [place].AT,PERRAL, bone facies NG tube L Nostril Supple Neck,No JVD, No cervical lymphadenopathy appriciated.  Tracheostomy midline Symmetrical Chest wall movement, decreased breath sounds bilaterally scattered rhonchi RRR,No Gallops,Rubs or new Murmurs, No Parasternal Heave +ve B.Sounds, Abd Soft, Non tender, No organomegaly appriciated, No rebound - guarding or rigidity. No Cyanosis, Clubbing + edema, No new Rash or bruise    I&Os 1812/2800 Foley - yes Trach #6 XLT  Data Review   CBC  Recent Labs Lab 01/27/14 0600 01/29/14 0630  WBC 8.3 7.5  HGB 11.5* 11.5*  HCT 38.6 36.3  PLT 237 230  MCV 99.5 96.0  MCH 29.6 30.4  MCHC 29.8* 31.7  RDW 17.1* 16.2*    Chemistries   Recent Labs Lab 01/26/14 0500 01/29/14 0630  NA 144 141  K 3.7 3.8  CL 95* 89*  CO2 38* 38*  GLUCOSE 136* 263*  BUN 11 19  CREATININE 0.80 0.78  CALCIUM 8.9 9.8    Coagulation profile No results found for this basename: INR, PROTIME,  in the last 168 hours  No  results found for this basename: DDIMER,  in the last 72 hours  Cardiac Enzymes No results found for this basename: CK, CKMB, TROPONINI, MYOGLOBIN,  in the last 168 hours ------------------------------------------------------------------------------------------------------------------ No components found with this basename: POCBNP,   Micro Results No results found for this or any previous visit (from the past 240 hour(s)).     Assessment & Plan   Respiratory failure; ATC in a.m. and ventilator at night, tolerating PMV.  Healthcare associated pneumonia continue with PO Doxycycline Abdominal cellulitis with panniculitis and erosive candidiasis on doxycycline and fluconazole Obstructive sleep apnea status post trach Chronic pain controlled Diabetes mellitus type 2 continue Levemir and insulin sliding scale Congestive heart failure continue with Lasix  Hypertension controlled Bipolar disorder continue with trazodone , Depakote, Cymbalta and bupropion . Generalized weakness continue with PT OT as tolerated Wounds continue with wound care team Protein calorie malnutrition; continue with NG tube feeding Agitation start morphine/Versed when necessary/fentanyl when necessary  Plan  Decrease Solu-Medrol  to 60 mg IV every 8 hours  Code Status: Full    DVT Prophylaxis  Lovenox    Merton Border M.D on 01/31/2014 at 11:12 AM

## 2014-02-01 NOTE — Progress Notes (Signed)
[facility]                                                                                              Progress note     Patient Demographics  Kim Jenkins, is a 48 y.o. female  VOZ:[id number]  [id number]  DOB - [birth year]  Admit date - 01/23/2014  Admitting Physician Suzanna Obey, MD  Outpatient Primary MD for the patient is No PCP Per Patient  LOS - 9   Chief complaint     Respiratory failure    Obesity    Cellulitis    Diabetes mellitus type 2        Subjective:   [name] episode of yesterday noted. Patient was placed on the ventilator again for the whole night. Still confused and still complains of constipation. No reports of chest pains or   Objective:   Vital signs  Temperature 98.9 Heart rate 83 Respiratory rate 19 Blood pressure 140/70 Pulse ox 100 %  Exam Awake Alert, confused, No new F.N deficits, Normal affect [place].AT,PERRAL, bone facies NG tube L Nostril Supple Neck,No JVD, No cervical lymphadenopathy appriciated.  Tracheostomy midline Symmetrical Chest wall movement, decreased breath sounds bilaterally scattered rhonchi RRR,No Gallops,Rubs or new Murmurs, No Parasternal Heave +ve B.Sounds, Abd Soft, Non tender, No organomegaly appriciated, No rebound - guarding or rigidity. No Cyanosis, Clubbing + edema, No new Rash or bruise    I&Os 1367/3300 Foley - yes Trach #6 XLT  Data Review   CBC  Recent Labs Lab 01/27/14 0600 01/29/14 0630  WBC 8.3 7.5  HGB 11.5* 11.5*  HCT 38.6 36.3  PLT 237 230  MCV 99.5 96.0  MCH 29.6 30.4  MCHC 29.8* 31.7  RDW 17.1* 16.2*    Chemistries   Recent Labs Lab 01/26/14 0500 01/29/14 0630  NA 144 141  K 3.7 3.8  CL 95* 89*  CO2 38* 38*  GLUCOSE 136* 263*  BUN 11 19  CREATININE 0.80 0.78  CALCIUM 8.9 9.8    Coagulation profile No results found for this basename: INR, PROTIME,  in the last 168 hours  No  results found for this basename: DDIMER,  in the last 72 hours  Cardiac Enzymes No results found for this basename: CK, CKMB, TROPONINI, MYOGLOBIN,  in the last 168 hours ------------------------------------------------------------------------------------------------------------------ No components found with this basename: POCBNP,   Micro Results No results found for this or any previous visit (from the past 240 hour(s)).     Assessment & Plan   Respiratory failure; ATC in a.m. and ventilator at night, tolerating PMV.  Healthcare associated pneumonia continue with PO Doxycycline Abdominal cellulitis with panniculitis and erosive candidiasis on doxycycline and fluconazole Obstructive sleep apnea status post trach Chronic pain controlled Diabetes mellitus type 2 continue Levemir and insulin sliding scale Congestive heart failure continue with Lasix  Hypertension controlled Bipolar disorder continue with trazodone , Depakote, Cymbalta and bupropion . Generalized weakness continue with PT OT as tolerated Wounds continue with wound care team Protein calorie malnutrition; continue with NG tube feeding Agitation start morphine/Versed when necessary/fentanyl when necessary  Plan  Increase Levemir  Check labs in a.m.  Code Status: Full    DVT Prophylaxis  Lovenox    Merton Border M.D on 02/01/2014 at 11:28 AM

## 2014-02-02 ENCOUNTER — Other Ambulatory Visit (HOSPITAL_COMMUNITY): Payer: Medicare Other

## 2014-02-02 LAB — BASIC METABOLIC PANEL
BUN: 50 mg/dL — AB (ref 6–23)
CO2: 43 mEq/L (ref 19–32)
Calcium: 9.8 mg/dL (ref 8.4–10.5)
Chloride: 88 mEq/L — ABNORMAL LOW (ref 96–112)
Creatinine, Ser: 0.87 mg/dL (ref 0.50–1.10)
GFR, EST AFRICAN AMERICAN: 89 mL/min — AB (ref >90)
GFR, EST NON AFRICAN AMERICAN: 77 mL/min — AB (ref >90)
Glucose, Bld: 382 mg/dL — ABNORMAL HIGH (ref 70–99)
Potassium: 4.6 mEq/L (ref 3.7–5.3)
Sodium: 141 mEq/L (ref 137–147)

## 2014-02-02 LAB — CBC
HEMATOCRIT: 39.7 % (ref 36.0–46.0)
Hemoglobin: 12.3 g/dL (ref 12.0–15.0)
MCH: 30.3 pg (ref 26.0–34.0)
MCHC: 31 g/dL (ref 30.0–36.0)
MCV: 97.8 fL (ref 78.0–100.0)
Platelets: 205 10*3/uL (ref 150–400)
RBC: 4.06 MIL/uL (ref 3.87–5.11)
RDW: 15.4 % (ref 11.5–15.5)
WBC: 6.5 10*3/uL (ref 4.0–10.5)

## 2014-02-02 LAB — BLOOD GAS, ARTERIAL
Acid-Base Excess: 16.2 mmol/L — ABNORMAL HIGH (ref 0.0–2.0)
Bicarbonate: 41.5 mEq/L — ABNORMAL HIGH (ref 20.0–24.0)
FIO2: 45 %
MECHVT: 500 mL
O2 Saturation: 95.5 %
PCO2 ART: 61.6 mmHg — AB (ref 35.0–45.0)
PEEP/CPAP: 5 cmH2O
Patient temperature: 97.5
RATE: 18 resp/min
TCO2: 43.5 mmol/L (ref 0–100)
pH, Arterial: 7.44 (ref 7.350–7.450)
pO2, Arterial: 76.5 mmHg — ABNORMAL LOW (ref 80.0–100.0)

## 2014-02-02 NOTE — Progress Notes (Signed)
[facility]                                                                                              Progress note     Patient Demographics  Kim Jenkins, is a 48 y.o. female  [id number]:[id number]  [id number]  DOB - 19-Nov-1965  Admit date - 01/23/2014  Admitting Physician Suzanna Obey, MD  Outpatient Primary MD for the patient is No PCP Per Patient  LOS - 10   Chief complaint     Respiratory failure    Obesity    Cellulitis    Diabetes mellitus type 2        Subjective:   Kim Jenkins  confused ,   Objective:   Vital signs  Temperature 97.5 Heart rate 72 Respiratory rate 16 Blood pressure 116/72 Pulse ox 100 %  Exam Awake Alert, confused, No new F.N deficits, Normal affect [place].AT,PERRAL, bone facies NG tube L Nostril Supple Neck,No JVD, No cervical lymphadenopathy appriciated.  Tracheostomy midline Symmetrical Chest wall movement, decreased breath sounds bilaterally scattered rhonchi RRR,No Gallops,Rubs or new Murmurs, No Parasternal Heave +ve B.Sounds, Abd Soft, Non tender, No organomegaly appriciated, No rebound - guarding or rigidity. No Cyanosis, Clubbing + edema, No new Rash or bruise    I&Os 1601/4850 Foley - yes Trach #6 XLT  Data Review   CBC  Recent Labs Lab 01/27/14 0600 01/29/14 0630 02/02/14 0500  WBC 8.3 7.5 6.5  HGB 11.5* 11.5* 12.3  HCT 38.6 36.3 39.7  PLT 237 230 205  MCV 99.5 96.0 97.8  MCH 29.6 30.4 30.3  MCHC 29.8* 31.7 31.0  RDW 17.1* 16.2* 15.4    Chemistries   Recent Labs Lab 01/29/14 0630 02/02/14 0500  NA 141 141  K 3.8 4.6  CL 89* 88*  CO2 38* 43*  GLUCOSE 263* 382*  BUN 19 50*  CREATININE 0.78 0.87  CALCIUM 9.8 9.8    Coagulation profile No results found for this basename: INR, PROTIME,  in the last 168 hours  No results found for this basename: DDIMER,  in the last 72 hours  Cardiac Enzymes No results found for  this basename: CK, CKMB, TROPONINI, MYOGLOBIN,  in the last 168 hours ------------------------------------------------------------------------------------------------------------------ No components found with this basename: POCBNP,   Micro Results No results found for this or any previous visit (from the past 240 hour(s)).     Assessment & Plan   Respiratory failure; ATC in a.m. and ventilator at night, tolerating PMV.  Healthcare associated pneumonia  Abdominal cellulitis with panniculitis and erosive candidiasis on doxycycline and fluconazole Obstructive sleep apnea status post trach Chronic pain controlled Diabetes mellitus type 2 continue Levemir and insulin sliding scale Congestive heart failure continue with Lasix  Hypertension controlled Bipolar disorder continue with trazodone , Depakote, Cymbalta and bupropion . Generalized weakness continue with PT OT as tolerated Wounds continue with wound care team Protein calorie malnutrition; continue with NG tube feeding Agitation start morphine/Versed when necessary/fentanyl when necessary  Plan  Decrease Lasix to 40 mg once daily Seroquel each bedtime.  Code Status: Full    DVT Prophylaxis  Lovenox    Merton Border M.D on 02/02/2014 at 10:10 AM

## 2014-02-02 NOTE — Consult Note (Signed)
PULMONARY / CRITICAL CARE MEDICINE  Name: Kim Jenkins MRN: [id number] DOB: 07/12/1966    ADMISSION DATE:  01/23/2014 CONSULTATION DATE:  01/27/2014  REFERRING MD :  Laren Everts   CHIEF COMPLAINT:  Trach status / vent weaning   BRIEF PATIENT DESCRIPTION: 48 yo morbidly obese with OSA / OHS initially [facility] 5[facility] for evaluation of leg pain after fall. Noted to be hypoxic and hypercarbic, requiring mechanical ventilation. Course was complicated by panniculitis involving, cutaneous candiasis and MRSA tracheobronchitis. Unable to be liberated from mechanical ventilation.  Transferred [facility] on 5/8.  INTERVAL HISTORY:   Vented overnight.  Intermittently confused.  VITAL SIGNS:  Reviewed  PHYSICAL EXAMINATION: General:  No ditress Neuro:  Awake, confused HEENT:  Tracheostomy site intact Cardiovascular:  Regular, no added sounds Lungs:  Diminished bilateral air entry Abdomen:  Erythema, soft, bowel sounds diminished Musculoskeletal:  Moves all extremities  LABS: CBC  Recent Labs Lab 01/27/14 0600 01/29/14 0630 02/02/14 0500  WBC 8.3 7.5 6.5  HGB 11.5* 11.5* 12.3  HCT 38.6 36.3 39.7  PLT 237 230 205   Coag's No results found for this basename: APTT, INR,  in the last 168 hours  BMET  Recent Labs Lab 01/29/14 0630 02/02/14 0500  NA 141 141  K 3.8 4.6  CL 89* 88*  CO2 38* 43*  BUN 19 50*  CREATININE 0.78 0.87  GLUCOSE 263* 382*   Electrolytes  Recent Labs Lab 01/29/14 0630 02/02/14 0500  CALCIUM 9.8 9.8   Sepsis Markers No results found for this basename: LATICACIDVEN, PROCALCITON, O2SATVEN,  in the last 168 hours ABG  Recent Labs Lab 01/27/14 1630 01/28/14 1120 02/02/14 0519  PHART 7.358 7.377 7.440  PCO2ART 81.2* 71.9* 61.6*  PO2ART 65.5* 92.8 76.5*   Liver Enzymes No results found for this basename: AST, ALT, ALKPHOS, BILITOT, ALBUMIN,  in the last 168 hours Cardiac Enzymes No results found for this basename: TROPONINI,  PROBNP,  in the last 168 hours Glucose No results found for this basename: GLUCAP,  in the last 168 hours  IMAGING: Dg Chest Port 1 View  02/02/2014   CLINICAL DATA:  Respiratory failure  EXAM: PORTABLE CHEST - 1 VIEW  COMPARISON:  01/30/2012  FINDINGS: Tracheostomy tube tip is above the carina. There is a left arm PICC line with tip in the cavoatrial junction. Nasogastric tube is in the stomach.  Stable cardiac enlargement and pulmonary venous congestion.  IMPRESSION: 1. Stable support apparatus. 2. No change in pulmonary venous congestion   Electronically Signed   By: Kerby Moors M.D.   On: 02/02/2014 08:06    ASSESSMENT / PLAN:  Acute on chronic respiratory failure OSA / OHS MRSA bronchitis Pulmonary edema Tracheostomy status Morbid obesity Acute delirium   Supplemental oxygen via trach collar for goal [id number]  Continue weaning per protocol during day time  Nocturnal ventilatory support, suspect will need it chronically, so appears appropriate for vent SNF  Antibiotics per primary team  Aim for negative volume status  Would avoid opioids ( respiratory depression / delirium ) and benzodiazepines ( delirium ); use atypical antipsychotics instead  I have personally obtained history, examined patient, evaluated and interpreted laboratory and imaging results, reviewed medical records, formulated assessment / plan and placed orders.  Doree Fudge, MD Pulmonary and [place] Pager: [phone]  02/02/2014, 12:11 PM

## 2014-02-05 LAB — BLOOD GAS, ARTERIAL
ACID-BASE EXCESS: 15.8 mmol/L — AB (ref 0.0–2.0)
Bicarbonate: 41 mEq/L — ABNORMAL HIGH (ref 20.0–24.0)
FIO2: 0.35 %
O2 SAT: 96.4 %
PATIENT TEMPERATURE: 98.6
PCO2 ART: 61.3 mmHg — AB (ref 35.0–45.0)
TCO2: 42.9 mmol/L (ref 0–100)
pH, Arterial: 7.44 (ref 7.350–7.450)
pO2, Arterial: 76.5 mmHg — ABNORMAL LOW (ref 80.0–100.0)

## 2014-02-06 LAB — BLOOD GAS, ARTERIAL
Acid-Base Excess: 17.3 mmol/L — ABNORMAL HIGH (ref 0.0–2.0)
Bicarbonate: 42.7 mEq/L — ABNORMAL HIGH (ref 20.0–24.0)
FIO2: 0.45 %
Mode: POSITIVE
O2 Saturation: 93.2 %
PEEP/CPAP: 5 cmH2O
PH ART: 7.437 (ref 7.350–7.450)
PO2 ART: 64.7 mmHg — AB (ref 80.0–100.0)
PRESSURE SUPPORT: 5 cmH2O
Patient temperature: 98.6
TCO2: 44.7 mmol/L (ref 0–100)
pCO2 arterial: 64.4 mmHg (ref 35.0–45.0)

## 2014-02-06 NOTE — Progress Notes (Signed)
PULMONARY / CRITICAL CARE MEDICINE  Name: Kim Jenkins MRN: [id number] DOB: 1966-05-17    ADMISSION DATE:  01/23/2014 CONSULTATION DATE:  01/27/2014  REFERRING MD :  Laren Everts   CHIEF COMPLAINT:  Trach status / vent weaning   BRIEF PATIENT DESCRIPTION: 48 yo morbidly obese with OSA / OHS initially [facility]red [facility] on 5/5 for evaluation of leg pain after fall. Noted to be hypoxic and hypercarbic, requiring mechanical ventilation. Course was complicated by panniculitis involving, cutaneous candiasis and MRSA tracheobronchitis. Unable to be liberated from mechanical ventilation.  Transferred to [facility] on 5/8.  INTERVAL HISTORY:   Weaning well.  Back on vent at night.  Reportedly was on BiPAP ( via trach? ) in the facility she was taken from  [place]:  Reviewed  PHYSICAL EXAMINATION: General:  Resting comfortable, morbidly obese Neuro:  Awake, alert, communicating well HEENT:  Tracheostomy site intact Cardiovascular:  Distant heart sounds, no murmurs Lungs:  Diminished bilateral air entry Abdomen:  Erythema, soft, bowel sounds diminished Musculoskeletal:  Moves all extremities  LABS: CBC  Recent Labs Lab 02/02/14 0500  WBC 6.5  HGB 12.3  HCT 39.7  PLT 205   Coag's No results found for this basename: APTT, INR,  in the last 168 hours  BMET  Recent Labs Lab 02/02/14 0500  NA 141  K 4.6  CL 88*  CO2 43*  BUN 50*  CREATININE 0.87  GLUCOSE 382*   Electrolytes  Recent Labs Lab 02/02/14 0500  CALCIUM 9.8   Sepsis Markers No results found for this basename: LATICACIDVEN, PROCALCITON, O2SATVEN,  in the last 168 hours ABG  Recent Labs Lab 02/02/14 0519 02/05/14 2014 02/06/14 0900  PHART 7.440 7.440 7.437  PCO2ART 61.6* 61.3* 64.4*  PO2ART 76.5* 76.5* 64.7*   Liver Enzymes No results found for this basename: AST, ALT, ALKPHOS, BILITOT, ALBUMIN,  in the last 168 hours Cardiac Enzymes No results found for this basename: TROPONINI, PROBNP,  in the  last 168 hours Glucose No results found for this basename: GLUCAP,  in the last 168 hours  IMAGING: No results found.  ASSESSMENT / PLAN:  Acute on chronic respiratory failure OSA / OHS MRSA bronchitis Pulmonary edema Tracheostomy status Morbid obesity   Supplemental oxygen via trach collar for goal SpO2>92  Continue weaning per protocol during day time  Nocturnal ventilatory support, suspect will need it chronically, so appears appropriate for vent SNF  Need to clarify with previous facility about her BiPAP / trach use  Antibiotics per primary team  Aim for negative volume status  Would avoid opioids ( respiratory depression / delirium ) and benzodiazepines ( delirium ); use atypical antipsychotics instead  I have personally obtained history, examined patient, evaluated and interpreted laboratory and imaging results, reviewed medical records, formulated assessment / plan and placed orders.  Doree Fudge, MD Pulmonary and [place] Pager: [phone]  02/06/2014, 10:25 AM

## 2014-02-07 ENCOUNTER — Institutional Professional Consult (permissible substitution) (HOSPITAL_COMMUNITY): Payer: Medicare Other

## 2014-02-07 LAB — BASIC METABOLIC PANEL
BUN: 35 mg/dL — ABNORMAL HIGH (ref 6–23)
CO2: 37 mEq/L — ABNORMAL HIGH (ref 19–32)
Calcium: 9.6 mg/dL (ref 8.4–10.5)
Chloride: 89 mEq/L — ABNORMAL LOW (ref 96–112)
Creatinine, Ser: 0.95 mg/dL (ref 0.50–1.10)
GFR, EST AFRICAN AMERICAN: 80 mL/min — AB (ref >90)
GFR, EST NON AFRICAN AMERICAN: 69 mL/min — AB (ref >90)
Glucose, Bld: 200 mg/dL — ABNORMAL HIGH (ref 70–99)
Potassium: 3.7 mEq/L (ref 3.7–5.3)
SODIUM: 137 meq/L (ref 137–147)

## 2014-02-09 NOTE — Progress Notes (Signed)
[facility]                                                                                              Progress note     Patient Demographics  Kim Jenkins, is a 48 y.o. female  [id number]  [id number]  DOB - 07-Feb-1966  Admit date - 01/23/2014  Admitting Physician Suzanna Obey, MD  Outpatient Primary MD for the patient is No PCP Per Patient  LOS - 31   Chief complaint     Respiratory failure    Obesity    Cellulitis    Diabetes mellitus type 2        Subjective:   Kim Jenkins  has no new complaints feeling okay,   Objective:   Vital signs  Temperature 97.1 Heart rate 61 Respiratory rate 16 Blood pressure 109/66 Pulse ox 97%   Exam Awake Alert, confused, No new F.N deficits, Normal affect [place].AT,PERRAL, bone facies NG tube L Nostril Supple Neck,No JVD, No cervical lymphadenopathy appriciated.  Tracheostomy midline Symmetrical Chest wall movement, decreased breath sounds bilaterally scattered rhonchi RRR,No Gallops,Rubs or new Murmurs, No Parasternal Heave +ve B.Sounds, Abd Soft, Non tender, No organomegaly appriciated, No rebound - guarding or rigidity. No Cyanosis, Clubbing + edema, No new Rash or bruise    I&Os 1190/1300 Trach #6 XLT  Data Review   CBC No results found for this basename: WBC, HGB, HCT, PLT, MCV, MCH, MCHC, RDW, NEUTRABS, LYMPHSABS, MONOABS, EOSABS, BASOSABS, BANDABS, BANDSABD,  in the last 168 hours  Chemistries   Recent Labs Lab 02/07/14 0615  NA 137  K 3.7  CL 89*  CO2 37*  GLUCOSE 200*  BUN 35*  CREATININE 0.95  CALCIUM 9.6    Coagulation profile No results found for this basename: INR, PROTIME,  in the last 168 hours  No results found for this basename: DDIMER,  in the last 72 hours  Cardiac Enzymes No results found for this basename: CK, CKMB, TROPONINI, MYOGLOBIN,  in the last 168  hours ------------------------------------------------------------------------------------------------------------------ No components found with this basename: POCBNP,   Micro Results No results found for this or any previous visit (from the past 240 hour(s)).     Assessment & Plan   Respiratory failure; ATC in a.m. and ventilator at night, tolerating PMV.  Healthcare associated pneumonia  Abdominal cellulitis with panniculitis and erosive candidiasis on doxycycline and fluconazole Obstructive sleep apnea status post trach Chronic pain controlled Diabetes mellitus type 2 continue Levemir and insulin sliding scale Congestive heart failure continue with Lasix  Hypertension controlled Bipolar disorder continue with trazodone , Depakote, Cymbalta and bupropion . Generalized weakness continue with PT OT as tolerated Wounds continue with wound care team Protein calorie malnutrition; tolerating by mouth but questionable aspiration for MBSS in a.m. Agitation start morphine/Versed when necessary/fentanyl when necessary  Plan  MBSS in AM Continue same treatment.  Code Status: Full    DVT Prophylaxis  Lovenox    Merton Border M.D on 02/09/2014 at 12:07 PM

## 2014-02-10 NOTE — Progress Notes (Signed)
[facility]                                                                                              Progress note     Patient Demographics  Kim Jenkins, is a 48 y.o. female  [id number]  [id number]  DOB - 19-Dec-1965  Admit date - 01/23/2014  Admitting Physician Suzanna Obey, MD  Outpatient Primary MD for the patient is No PCP Per Patient  LOS - 61   Chief complaint     Respiratory failure    Obesity    Cellulitis    Diabetes mellitus type 2        Subjective:   Kim Jenkins  has no new complaints feeling okay,   Objective:   Vital signs  Temperature 97 Heart rate 64 Respiratory rate 16 Blood pressure 114/67 Pulse ox 100%   Exam Awake Alert, confused, No new F.N deficits, Normal affect [place].AT,PERRAL, Moon facies Supple Neck,No JVD, No cervical lymphadenopathy appriciated.  Tracheostomy midline Symmetrical Chest wall movement, decreased breath sounds bilaterally  RRR,No Gallops,Rubs or new Murmurs, No Parasternal Heave +ve B.Sounds, Abd Soft, Non tender, obese, No organomegaly appriciated, No rebound - guarding or rigidity. No Cyanosis, Clubbing + edema lower extremities, No new Rash or bruise    I&Os 1130/1650 Trach #6 XLT  Data Review   CBC No results found for this basename: WBC, HGB, HCT, PLT, MCV, MCH, MCHC, RDW, NEUTRABS, LYMPHSABS, MONOABS, EOSABS, BASOSABS, BANDABS, BANDSABD,  in the last 168 hours  Chemistries   Recent Labs Lab 02/07/14 0615  NA 137  K 3.7  CL 89*  CO2 37*  GLUCOSE 200*  BUN 35*  CREATININE 0.95  CALCIUM 9.6    Coagulation profile No results found for this basename: INR, PROTIME,  in the last 168 hours  No results found for this basename: DDIMER,  in the last 72 hours  Cardiac Enzymes No results found for this basename: CK, CKMB, TROPONINI, MYOGLOBIN,  in the last 168  hours ------------------------------------------------------------------------------------------------------------------ No components found with this basename: POCBNP,   Micro Results No results found for this or any previous visit (from the past 240 hour(s)).     Assessment & Plan   Respiratory failure; ATC in a.m. and ventilator at night, not tolerating PMV very well.  Healthcare associated pneumonia treated Abdominal cellulitis with panniculitis and erosive candidiasis treated Obstructive sleep apnea status post trach Chronic pain controlled being followed by Dr. Humphrey Rolls Diabetes mellitus type 2 continue Levemir and insulin sliding scale Congestive heart failure continue with Lasix  Hypertension controlled Bipolar disorder continue with trazodone , Depakote, Cymbalta and bupropion . Generalized weakness continue with PT OT as tolerated Wounds continue with wound care team Protein calorie malnutrition; tolerating by mouth but questionable aspiration , we will monitor since the patient could not    have MBSS due to unavailability of a table that can tolerate her weight Agitation start morphine/Versed when necessary/fentanyl when necessary  Plan  Continue same treatment.  Code Status: Full    DVT Prophylaxis  Lovenox    Merton Border M.D on 02/10/2014 at 11:25 AM

## 2014-02-10 NOTE — Progress Notes (Addendum)
PULMONARY / CRITICAL CARE MEDICINE  Name: Kim Jenkins MRN: [id number] DOB: [birth year]    ADMISSION DATE:  01/23/2014 CONSULTATION DATE:  01/27/2014  REFERRING MD :  Laren Everts   CHIEF COMPLAINT:  Trach status / vent weaning   BRIEF PATIENT DESCRIPTION: 48 yo morbidly obese with OSA / OHS initially [facility] on 5/5 for evaluation of leg pain after fall. Noted to be hypoxic and hypercarbic, requiring mechanical ventilation. Course was complicated by panniculitis involving, cutaneous candiasis and MRSA tracheobronchitis. Unable to be liberated from mechanical ventilation.  Transferred to [facility] on 5/8.  INTERVAL HISTORY:   Weaning well.  Back on vent at night.  Reportedly was on BiPAP ( via trach? ) in the facility she was taken from  VITAL SIGNS:  Reviewed  PHYSICAL EXAMINATION: General:  Resting comfortable, morbidly obese Neuro:  Awake, alert, communicating well HEENT:  Tracheostomy site intact Cardiovascular:  Distant heart sounds, no murmurs Lungs:  Diminished bilateral air entry Abdomen:  Erythema, soft, bowel sounds diminished Musculoskeletal:  Moves all extremities  LABS: ABG    Component Value Date/Time   PHART 7.437 02/06/2014 0900   PCO2ART 64.4* 02/06/2014 0900   PO2ART 64.7* 02/06/2014 0900   HCO3 42.7* 02/06/2014 0900   TCO2 44.7 02/06/2014 0900   O2SAT 93.2 02/06/2014 0900     Recent Labs Lab 02/07/14 0615  NA 137  K 3.7  CL 89*  CO2 37*  BUN 35*  CREATININE 0.95  GLUCOSE 200*   No results found for this basename: HGB, HCT, WBC, PLT,  in the last 168 hours   IMAGING: No results found.  ASSESSMENT / PLAN:  Acute on chronic respiratory failure OSA / OHS MRSA bronchitis Pulmonary edema Tracheostomy status Morbid obesity  Discussion Think she is at baseline pulm status which should be vent @ night ATC day time. She will require vent/SNF  Plan  Supplemental oxygen via trach collar for goal SpO2>92, during day  Nocturnal ventilatory  support, suspect will need it chronically, so appears appropriate for vent SNF  Aim for negative volume status  Would avoid opioids ( respiratory depression / delirium ) and benzodiazepines ( delirium ); use atypical antipsychotics instead  We will see As needed.   Continued full support at night in future could revisit this  WT loss  Erick Colace, 02/10/2014, 1:27 PM   I have fully examined this patient and agree with above findings.    And edited in full  Lavon Paganini. Titus Mould, MD, [place] Pgr: [employer] Pulmonary & Critical Care

## 2014-02-11 NOTE — Progress Notes (Signed)
[facility]                                                                                              Progress note     Patient Demographics  Kim Jenkins, is a 48 y.o. female  UVO:[id number]  [id number]  DOB - 1966-07-01  Admit date - 01/23/2014  Admitting Physician Suzanna Obey, MD  Outpatient Primary MD for the patient is No PCP Per Patient  LOS - 37   Chief complaint     Respiratory failure    Obesity    Cellulitis    Diabetes mellitus type 2        Subjective:   Kim Jenkins  has no new complaints feeling okay, no chest pains or shortness of breath  Objective:   Vital signs  Temperature 97 Heart rate 56 Respiratory rate 15 Blood pressure 88/66 Pulse ox 100%   Exam Awake Alert, confused, No new F.N deficits, Normal affect [place].AT,PERRAL, Moon facies Supple Neck,No JVD, No cervical lymphadenopathy appriciated.  Tracheostomy midline Symmetrical Chest wall movement, decreased breath sounds bilaterally  RRR,No Gallops,Rubs or new Murmurs, No Parasternal Heave +ve B.Sounds, Abd Soft, Non tender, obese, No organomegaly appriciated, No rebound - guarding or rigidity. No Cyanosis, Clubbing + edema lower extremities, No new Rash or bruise    I&Os 2040/1700 Trach #6 XLT  Data Review   CBC No results found for this basename: WBC, HGB, HCT, PLT, MCV, MCH, MCHC, RDW, NEUTRABS, LYMPHSABS, MONOABS, EOSABS, BASOSABS, BANDABS, BANDSABD,  in the last 168 hours  Chemistries   Recent Labs Lab 02/07/14 0615  NA 137  K 3.7  CL 89*  CO2 37*  GLUCOSE 200*  BUN 35*  CREATININE 0.95  CALCIUM 9.6    Coagulation profile No results found for this basename: INR, PROTIME,  in the last 168 hours  No results found for this basename: DDIMER,  in the last 72 hours  Cardiac Enzymes No results found for this basename: CK, CKMB, TROPONINI, MYOGLOBIN,  in the last 168  hours ------------------------------------------------------------------------------------------------------------------ No components found with this basename: POCBNP,   Micro Results No results found for this or any previous visit (from the past 240 hour(s)).     Assessment & Plan   Respiratory failure; ATC in a.m. and ventilator at night, not tolerating PMV very well.  Healthcare associated pneumonia treated Abdominal cellulitis with panniculitis and erosive candidiasis treated Obstructive sleep apnea status post trach Chronic pain controlled being followed by Dr. Humphrey Rolls Diabetes mellitus type 2 continue Levemir and insulin sliding scale Congestive heart failure continue with Lasix  Hypertension controlled Bipolar disorder continue with trazodone , Depakote, Cymbalta and bupropion . Generalized weakness continue with PT OT as tolerated Wounds continue with wound care team Protein calorie malnutrition; tolerating by mouth but questionable aspiration , we will monitor since the patient could not    have MBSS due to unavailability of a table that can tolerate her weight Agitation start morphine/Versed when necessary/fentanyl when necessary  Plan  Awaiting placement in a ventilator facility Continue weaning efforts  Code Status: Full    DVT Prophylaxis  Lovenox    Merton Border M.D on 02/11/2014 at 11:11 AM

## 2014-02-12 LAB — BASIC METABOLIC PANEL
BUN: 27 mg/dL — AB (ref 6–23)
CHLORIDE: 92 meq/L — AB (ref 96–112)
CO2: 39 mEq/L — ABNORMAL HIGH (ref 19–32)
Calcium: 9.6 mg/dL (ref 8.4–10.5)
Creatinine, Ser: 0.72 mg/dL (ref 0.50–1.10)
GFR calc Af Amer: >90 mL/min (ref >90)
Glucose, Bld: 238 mg/dL — ABNORMAL HIGH (ref 70–99)
POTASSIUM: 4.2 meq/L (ref 3.7–5.3)
SODIUM: 138 meq/L (ref 137–147)

## 2014-02-12 LAB — CBC
HEMATOCRIT: 34.6 % — AB (ref 36.0–46.0)
Hemoglobin: 10.8 g/dL — ABNORMAL LOW (ref 12.0–15.0)
MCH: 30.4 pg (ref 26.0–34.0)
MCHC: 31.2 g/dL (ref 30.0–36.0)
MCV: 97.5 fL (ref 78.0–100.0)
Platelets: 242 10*3/uL (ref 150–400)
RBC: 3.55 MIL/uL — ABNORMAL LOW (ref 3.87–5.11)
RDW: 15.7 % — AB (ref 11.5–15.5)
WBC: 9.9 10*3/uL (ref 4.0–10.5)

## 2014-02-12 NOTE — Progress Notes (Signed)
[facility]                                                                                              Progress note     Patient Demographics  Kim Jenkins, is a 48 y.o. female  [id number]  [id number]  DOB - 21-Dec-1965  Admit date - 01/23/2014  Admitting Physician Suzanna Obey, MD  Outpatient Primary MD for the patient is No PCP Per Patient  LOS - 25   Chief complaint     Respiratory failure    Obesity    Cellulitis    Diabetes mellitus type 2        Subjective:   Kim Jenkins  has no new complaints   Objective:   Vital signs  Temperature 98.4 Heart rate 63 Respiratory rate 16 Blood pressure 101/64 Pulse ox 98%   Exam Awake Alert, confused, No new F.N deficits, Normal affect [place].AT,PERRAL, Moon facies Supple Neck,No JVD, No cervical lymphadenopathy appriciated.  Tracheostomy midline Symmetrical Chest wall movement, decreased breath sounds bilaterally  RRR,No Gallops,Rubs or new Murmurs, No Parasternal Heave +ve B.Sounds, Abd Soft, Non tender, obese, No organomegaly appriciated, No rebound - guarding or rigidity. No Cyanosis, Clubbing + edema lower extremities, No new Rash or bruise    I&Os 2020/1400 Trach #6 XLT  Data Review   CBC  Recent Labs Lab 02/12/14 0707  WBC 9.9  HGB 10.8*  HCT 34.6*  PLT 242  MCV 97.5  MCH 30.4  MCHC 31.2  RDW 15.7*    Chemistries   Recent Labs Lab 02/07/14 0615 02/12/14 0707  NA 137 138  K 3.7 4.2  CL 89* 92*  CO2 37* 39*  GLUCOSE 200* 238*  BUN 35* 27*  CREATININE 0.95 0.72  CALCIUM 9.6 9.6    Coagulation profile No results found for this basename: INR, PROTIME,  in the last 168 hours  No results found for this basename: DDIMER,  in the last 72 hours  Cardiac Enzymes No results found for this basename: CK, CKMB, TROPONINI, MYOGLOBIN,  in the last 168  hours ------------------------------------------------------------------------------------------------------------------ No components found with this basename: POCBNP,   Micro Results No results found for this or any previous visit (from the past 240 hour(s)).     Assessment & Plan   Respiratory failure; ATC in a.m. and ventilator at night, not tolerating PMV very well.  Healthcare associated pneumonia treated Abdominal cellulitis with panniculitis and erosive candidiasis treated Obstructive sleep apnea status post trach Chronic pain controlled being followed by Dr. Humphrey Rolls Diabetes mellitus type 2 continue Levemir and insulin sliding scale Congestive heart failure continue with Lasix  Hypertension controlled Bipolar disorder continue with trazodone , Depakote, Cymbalta and bupropion . Generalized weakness continue with PT OT as tolerated Wounds continue with wound care team Protein calorie malnutrition; tolerating by mouth but questionable aspiration , we will monitor since the patient could not    have MBSS due to unavailability of a table that can tolerate her weight Agitation start morphine/Versed when necessary/fentanyl when necessary  Plan Include Levemir to 80 twice a day Awaiting placement in a ventilator facility  Continue weaning efforts  Code Status: Full    DVT Prophylaxis  Lovenox    Merton Border M.D on 02/12/2014 at 1:41 PM

## 2014-02-13 NOTE — Progress Notes (Signed)
[facility]                                                                                              Progress note     Patient Demographics  Kim Jenkins, is a 48 y.o. female  AOZ:[id number]  [id number]  DOB - 1965-10-04  Admit date - 01/23/2014  Admitting Physician Suzanna Obey, MD  Outpatient Primary MD for the patient is No PCP Per Patient  LOS - 21   Chief complaint     Respiratory failure    Obesity    Cellulitis    Diabetes mellitus type 2        Subjective:   Kim Jenkins  has no new complaints   Objective:   Vital signs  Temperature 98.1 Heart rate 60 Respiratory rate 15 Blood pressure 96/50 Pulse ox 96%   Exam Awake Alert, confused, No new F.N deficits, Normal affect [place].AT,PERRAL, Moon facies Supple Neck,No JVD, No cervical lymphadenopathy appriciated.  Tracheostomy midline Symmetrical Chest wall movement, decreased breath sounds bilaterally  RRR,No Gallops,Rubs or new Murmurs, No Parasternal Heave +ve B.Sounds, Abd Soft, Non tender, obese, No organomegaly appriciated, No rebound - guarding or rigidity. No Cyanosis, Clubbing + edema lower extremities, No new Rash or bruise    I&Os 1410/1650 Trach #6 XLT  Data Review   CBC  Recent Labs Lab 02/12/14 0707  WBC 9.9  HGB 10.8*  HCT 34.6*  PLT 242  MCV 97.5  MCH 30.4  MCHC 31.2  RDW 15.7*    Chemistries   Recent Labs Lab 02/07/14 0615 02/12/14 0707  NA 137 138  K 3.7 4.2  CL 89* 92*  CO2 37* 39*  GLUCOSE 200* 238*  BUN 35* 27*  CREATININE 0.95 0.72  CALCIUM 9.6 9.6    Coagulation profile No results found for this basename: INR, PROTIME,  in the last 168 hours  No results found for this basename: DDIMER,  in the last 72 hours  Cardiac Enzymes No results found for this basename: CK, CKMB, TROPONINI, MYOGLOBIN,  in the last 168  hours ------------------------------------------------------------------------------------------------------------------ No components found with this basename: POCBNP,   Micro Results No results found for this or any previous visit (from the past 240 hour(s)).     Assessment & Plan   Respiratory failure; ATC in a.m. and ventilator at night, not tolerating PMV very well.  Healthcare associated pneumonia treated Abdominal cellulitis with panniculitis and erosive candidiasis treated Obstructive sleep apnea status post trach Chronic pain controlled being followed by Dr. Humphrey Rolls Diabetes mellitus type 2 continue Levemir and insulin sliding scale Congestive heart failure continue with Lasix  Hypertension controlled Bipolar disorder continue with trazodone , Depakote, Cymbalta and bupropion . Generalized weakness continue with PT OT as tolerated Wounds continue with wound care team Protein calorie malnutrition; tolerating by mouth but questionable aspiration , we will monitor since the patient could not    have MBSS due to unavailability of a table that can tolerate her weight Agitation start morphine/Versed when necessary/fentanyl when necessary  Plan  Awaiting placement in a ventilator facility Continue weaning efforts  Code Status:  Full    DVT Prophylaxis  Lovenox    Merton Border M.D on 02/13/2014 at 11:39 AM

## 2014-02-14 NOTE — Progress Notes (Signed)
[facility]                                                                                              Progress note     Patient Demographics  Kim Jenkins, is a 48 y.o. female  CWC:[id number]  [id number]  DOB - 08/10/1966  Admit date - 01/23/2014  Admitting Physician Suzanna Obey, MD  Outpatient Primary MD for the patient is No PCP Per Patient  LOS - 30   Chief complaint     Respiratory failure    Obesity    Cellulitis    Diabetes mellitus type 2        Subjective:   Kim Jenkins  complains of neck pain around the strap area   Objective:   Vital signs  Temperature 97.3 Heart rate 66 Respiratory rate 15 Blood pressure 117/32 Pulse ox 98%  Exam Awake Alert, confused, No new F.N deficits, Normal affect [place].AT,PERRAL, Moon facies Supple Neck,No JVD, No cervical lymphadenopathy appriciated. A row area and the back of the neck around the step area  Tracheostomy midline Symmetrical Chest wall movement, decreased breath sounds bilaterally  RRR,No Gallops,Rubs or new Murmurs, No Parasternal Heave +ve B.Sounds, Abd Soft, Non tender, obese, No organomegaly appriciated, No rebound - guarding or rigidity. No Cyanosis, Clubbing + edema lower extremities, No new Rash or bruise    I&Os unknown Trach #6 XLT  Data Review   CBC  Recent Labs Lab 02/12/14 0707  WBC 9.9  HGB 10.8*  HCT 34.6*  PLT 242  MCV 97.5  MCH 30.4  MCHC 31.2  RDW 15.7*    Chemistries   Recent Labs Lab 02/12/14 0707  NA 138  K 4.2  CL 92*  CO2 39*  GLUCOSE 238*  BUN 27*  CREATININE 0.72  CALCIUM 9.6    Coagulation profile No results found for this basename: INR, PROTIME,  in the last 168 hours  No results found for this basename: DDIMER,  in the last 72 hours  Cardiac Enzymes No results found for this basename: CK, CKMB, TROPONINI, MYOGLOBIN,  in the last 168  hours ------------------------------------------------------------------------------------------------------------------ No components found with this basename: POCBNP,   Micro Results No results found for this or any previous visit (from the past 240 hour(s)).     Assessment & Plan   Respiratory failure; ATC in a.m. and ventilator at night, not tolerating PMV very well.  Healthcare associated pneumonia treated Abdominal cellulitis with panniculitis and erosive candidiasis treated Obstructive sleep apnea status post trach Chronic pain controlled being followed by Dr. Humphrey Rolls Diabetes mellitus type 2 continue Levemir and insulin sliding scale Congestive heart failure continue with Lasix  Hypertension controlled Bipolar disorder continue with trazodone , Depakote, Cymbalta and bupropion . Generalized weakness continue with PT OT as tolerated Wounds continue with wound care team Protein calorie malnutrition; tolerating by mouth but questionable aspiration , we will monitor since the patient could not    have MBSS due to unavailability of a table that can tolerate her weight Agitation start morphine/Versed when necessary/fentanyl when necessary Contact dermatitis around the neck. Plan Apply bacitracin cream  around the neck area with local wound therapy Awaiting placement in a ventilator facility Continue weaning efforts  Code Status: Full    DVT Prophylaxis  Lovenox    Merton Border M.D on 02/14/2014 at 11:12 AM

## 2014-02-15 NOTE — Progress Notes (Signed)
[facility]                                                                                              Progress note     Patient Demographics  Kim Jenkins, is a 48 y.o. female  QQV:[id number]  [id number]:[id number]  DOB - [birth year]  Admit date - 01/23/2014  Admitting Physician Suzanna Obey, MD  Outpatient Primary MD for the patient is No PCP Per Patient  LOS - 48   Chief complaint     Respiratory failure    Obesity    Cellulitis    Diabetes mellitus type 2        Subjective:   Kim Jenkins  complains of neck pain around the strap area   Objective:   Vital signs  Temperature 99 Heart rate 63 Respiratory rate 16 Blood pressure 122/76 Pulse ox 95%  Exam Awake Alert, confused, No new F.N deficits, Normal affect [place].AT,PERRAL, Moon facies Supple Neck,No JVD, No cervical lymphadenopathy appriciated. A row area and the back of the neck around the step area  Tracheostomy midline Symmetrical Chest wall movement, decreased breath sounds bilaterally  RRR,No Gallops,Rubs or new Murmurs, No Parasternal Heave +ve B.Sounds, Abd Soft, Non tender, obese, No organomegaly appriciated, No rebound - guarding or rigidity. No Cyanosis, Clubbing + edema lower extremities, No new Rash or bruise    I&Os 1800/1675 Trach #6 XLT  Data Review   CBC  Recent Labs Lab 02/12/14 0707  WBC 9.9  HGB 10.8*  HCT 34.6*  PLT 242  MCV 97.5  MCH 30.4  MCHC 31.2  RDW 15.7*    Chemistries   Recent Labs Lab 02/12/14 0707  NA 138  K 4.2  CL 92*  CO2 39*  GLUCOSE 238*  BUN 27*  CREATININE 0.72  CALCIUM 9.6    Coagulation profile No results found for this basename: INR, PROTIME,  in the last 168 hours  No results found for this basename: DDIMER,  in the last 72 hours  Cardiac Enzymes No results found for this basename: CK, CKMB, TROPONINI, MYOGLOBIN,  in the last 168  hours ------------------------------------------------------------------------------------------------------------------ No components found with this basename: POCBNP,   Micro Results No results found for this or any previous visit (from the past 240 hour(s)).     Assessment & Plan   Respiratory failure; ATC in a.m. and ventilator at night, continue with PMV.  Healthcare associated pneumonia treated Abdominal cellulitis with panniculitis and erosive candidiasis treated Obstructive sleep apnea status post trach Chronic pain controlled being followed by Dr. Humphrey Rolls Diabetes mellitus type 2 continue Levemir and insulin sliding scale Congestive heart failure continue with Lasix  Hypertension controlled Bipolar disorder continue with trazodone , Depakote, Cymbalta and bupropion . Generalized weakness continue with PT OT as tolerated Wounds continue with wound care team Protein calorie malnutrition; tolerating by mouth but questionable aspiration , we will monitor since the patient could not    have MBSS due to unavailability of a table that can tolerate her weight Agitation start morphine/Versed when necessary/fentanyl when necessary Contact dermatitis around the neck. Continue with bacitracin treatment Morbid obesity;  patient has lost significant weight since admission  Plan  Continue same treatment Awaiting placement in a ventilator facility Continue weaning efforts  Code Status: Full    DVT Prophylaxis  Lovenox    Merton Border M.D on 02/15/2014 at 1:36 PM

## 2014-02-16 ENCOUNTER — Other Ambulatory Visit (HOSPITAL_COMMUNITY): Payer: Medicare Other

## 2014-02-17 ENCOUNTER — Institutional Professional Consult (permissible substitution) (HOSPITAL_COMMUNITY): Payer: Medicare Other

## 2014-02-17 DIAGNOSIS — Z930 Tracheostomy status: Secondary | ICD-10-CM

## 2014-02-17 LAB — BASIC METABOLIC PANEL
BUN: 20 mg/dL (ref 6–23)
CO2: 33 mEq/L — ABNORMAL HIGH (ref 19–32)
CREATININE: 0.73 mg/dL (ref 0.50–1.10)
Calcium: 9.4 mg/dL (ref 8.4–10.5)
Chloride: 91 mEq/L — ABNORMAL LOW (ref 96–112)
GFR calc non Af Amer: >90 mL/min (ref >90)
Glucose, Bld: 213 mg/dL — ABNORMAL HIGH (ref 70–99)
POTASSIUM: 4.1 meq/L (ref 3.7–5.3)
Sodium: 135 mEq/L — ABNORMAL LOW (ref 137–147)

## 2014-02-17 LAB — CBC
HEMATOCRIT: 33.5 % — AB (ref 36.0–46.0)
Hemoglobin: 11 g/dL — ABNORMAL LOW (ref 12.0–15.0)
MCH: 31.1 pg (ref 26.0–34.0)
MCHC: 32.8 g/dL (ref 30.0–36.0)
MCV: 94.6 fL (ref 78.0–100.0)
Platelets: 131 10*3/uL — ABNORMAL LOW (ref 150–400)
RBC: 3.54 MIL/uL — ABNORMAL LOW (ref 3.87–5.11)
RDW: 16.3 % — AB (ref 11.5–15.5)
WBC: 9.5 10*3/uL (ref 4.0–10.5)

## 2014-02-17 LAB — HEPATIC FUNCTION PANEL
ALT: 21 U/L (ref 0–35)
AST: 15 U/L (ref 0–37)
Albumin: 2.6 g/dL — ABNORMAL LOW (ref 3.5–5.2)
Alkaline Phosphatase: 49 U/L (ref 39–117)
Total Bilirubin: 0.7 mg/dL (ref 0.3–1.2)
Total Protein: 6.6 g/dL (ref 6.0–8.3)

## 2014-02-17 LAB — LIPASE, BLOOD: Lipase: 11 U/L (ref 11–59)

## 2014-02-17 NOTE — Progress Notes (Signed)
PULMONARY / CRITICAL CARE MEDICINE  Name: Kim Jenkins MRN: [id number] DOB: July 21, 1966    ADMISSION DATE:  01/23/2014 CONSULTATION DATE:  01/27/2014  REFERRING MD :  Laren Everts   CHIEF COMPLAINT:  Trach status / vent weaning   BRIEF PATIENT DESCRIPTION: 48 yo morbidly obese with OSA / OHS [facility] 5/5 for evaluation of leg pain after fall. Noted to be hypoxic and hypercarbic, requiring mechanical ventilation. Course was complicated by panniculitis involving, cutaneous candiasis and MRSA tracheobronchitis. Unable to be liberated from mechanical ventilation.  Transferred to [facility] on 5/8.  INTERVAL HISTORY:   Weaning well.  Back on vent at night.  Reportedly was on BiPAP ( via trach? ) in the facility she was taken from  VITAL SIGNS:  Reviewed  PHYSICAL EXAMINATION: General:  Resting comfortable, morbidly obese Neuro:  Awake, alert, communicating well HEENT:  Tracheostomy site intact Cardiovascular:  Distant heart sounds, no murmurs Lungs:  Diminished bilateral air entry Abdomen:  Erythema, soft, bowel sounds diminished Musculoskeletal:  Moves all extremities  LABS: ABG    Component Value Date/Time   PHART 7.437 02/06/2014 0900   PCO2ART 64.4* 02/06/2014 0900   PO2ART 64.7* 02/06/2014 0900   HCO3 42.7* 02/06/2014 0900   TCO2 44.7 02/06/2014 0900   O2SAT 93.2 02/06/2014 0900     Recent Labs Lab 02/12/14 0707 02/17/14 0655  NA 138 135*  K 4.2 4.1  CL 92* 91*  CO2 39* 33*  BUN 27* 20  CREATININE 0.72 0.73  GLUCOSE 238* 213*    Recent Labs Lab 02/12/14 0707 02/17/14 0655  HGB 10.8* 11.0*  HCT 34.6* 33.5*  WBC 9.9 9.5  PLT 242 131*     IMAGING: Dg Abd Portable 1v  02/16/2014   CLINICAL DATA:  Ileus.  Morbid obesity.  EXAM: PORTABLE ABDOMEN - 1 VIEW  COMPARISON:  01/29/2014  FINDINGS: Two supine views. Markedly degraded exam secondary to patient body habitus. Only a small portion of the upper abdomen is included  The previously described  nasogastric tube is not readily identified. Bowel dilatation is identified. 8.9 cm bowel loop within the left side of the abdomen is favored to be small bowel. There is also right-sided colonic dilatation on the second image. No gross free intraperitoneal air.  IMPRESSION: Markedly limited exam, as detailed above.  Bowel dilatation, favored to be a combination of large and small bowel. This suggests either ileus or distal obstruction.   Electronically Signed   By: Abigail Miyamoto M.D.   On: 02/16/2014 14:00    ASSESSMENT / PLAN:  Acute on chronic respiratory failure OSA / OHS MRSA bronchitis Pulmonary edema Tracheostomy status Morbid obesity  Discussion Think she is at baseline pulm status which should be vent @ night ATC day time. She will require vent/SNF  Plan  Supplemental oxygen via trach collar for goal SpO2>92, during day  Nocturnal ventilatory support, suspect will need it chronically, so appears appropriate for vent SNF  Aim for negative volume status  Would avoid opioids ( respiratory depression / delirium ) and benzodiazepines ( delirium ); use atypical antipsychotics instead  Continued full support at night in future could revisit this if patient lost significant amount of weight.  WT loss and PT/OT.  Rush Farmer, M.D. [facility] Pulmonary/Critical Care Medicine. Pager: [phone]. After hours pager: [phone].

## 2014-02-18 ENCOUNTER — Other Ambulatory Visit (HOSPITAL_COMMUNITY): Payer: Medicare Other

## 2014-02-18 LAB — URINE MICROSCOPIC-ADD ON

## 2014-02-18 LAB — CBC
HCT: 33 % — ABNORMAL LOW (ref 36.0–46.0)
HCT: 31.1 % — ABNORMAL LOW (ref 36.0–46.0)
HEMOGLOBIN: 10.3 g/dL — AB (ref 12.0–15.0)
Hemoglobin: 10.2 g/dL — ABNORMAL LOW (ref 12.0–15.0)
MCH: 30.1 pg (ref 26.0–34.0)
MCH: 31.2 pg (ref 26.0–34.0)
MCHC: 31.2 g/dL (ref 30.0–36.0)
MCHC: 32.8 g/dL (ref 30.0–36.0)
MCV: 96.5 fL (ref 78.0–100.0)
MCV: 95.1 fL (ref 78.0–100.0)
Platelets: 122 10*3/uL — ABNORMAL LOW (ref 150–400)
Platelets: 120 10*3/uL — ABNORMAL LOW (ref 150–400)
RBC: 3.42 MIL/uL — AB (ref 3.87–5.11)
RBC: 3.27 MIL/uL — AB (ref 3.87–5.11)
RDW: 16.5 % — ABNORMAL HIGH (ref 11.5–15.5)
RDW: 16.6 % — ABNORMAL HIGH (ref 11.5–15.5)
WBC: 10.1 10*3/uL (ref 4.0–10.5)
WBC: 10.7 10*3/uL — ABNORMAL HIGH (ref 4.0–10.5)

## 2014-02-18 LAB — URINALYSIS, ROUTINE W REFLEX MICROSCOPIC
Glucose, UA: NEGATIVE mg/dL
Ketones, ur: NEGATIVE mg/dL
Nitrite: POSITIVE — AB
PH: 6 (ref 5.0–8.0)
Protein, ur: NEGATIVE mg/dL
SPECIFIC GRAVITY, URINE: 1.02 (ref 1.005–1.030)
Urobilinogen, UA: 1 mg/dL (ref 0.0–1.0)

## 2014-02-18 LAB — BASIC METABOLIC PANEL
BUN: 18 mg/dL (ref 6–23)
BUN: 16 mg/dL (ref 6–23)
CO2: 39 mEq/L — ABNORMAL HIGH (ref 19–32)
CO2: 40 meq/L — AB (ref 19–32)
Calcium: 9.3 mg/dL (ref 8.4–10.5)
Calcium: 9.1 mg/dL (ref 8.4–10.5)
Chloride: 90 mEq/L — ABNORMAL LOW (ref 96–112)
Chloride: 91 mEq/L — ABNORMAL LOW (ref 96–112)
Creatinine, Ser: 0.76 mg/dL (ref 0.50–1.10)
Creatinine, Ser: 0.73 mg/dL (ref 0.50–1.10)
GFR calc Af Amer: >90 mL/min (ref >90)
GFR calc Af Amer: >90 mL/min (ref >90)
GFR calc non Af Amer: >90 mL/min (ref >90)
GLUCOSE: 298 mg/dL — AB (ref 70–99)
Glucose, Bld: 155 mg/dL — ABNORMAL HIGH (ref 70–99)
Potassium: 3.9 mEq/L (ref 3.7–5.3)
Potassium: 4 mEq/L (ref 3.7–5.3)
SODIUM: 138 meq/L (ref 137–147)
Sodium: 136 mEq/L — ABNORMAL LOW (ref 137–147)

## 2014-02-19 LAB — URINE CULTURE

## 2014-02-20 NOTE — Progress Notes (Signed)
PULMONARY / CRITICAL CARE MEDICINE  Name: Kim Jenkins MRN: [id number] DOB: 08-17-1966    ADMISSION DATE:  01/23/2014 CONSULTATION DATE:  01/27/2014  REFERRING MD :  Laren Everts   CHIEF COMPLAINT:  Trach status / vent weaning   BRIEF PATIENT DESCRIPTION: 48 yo morbidly obese with OSA / OHS initially [facility] on 5/5 for evaluation of leg pain after fall. Noted to be hypoxic and hypercarbic, requiring mechanical ventilation. Course was complicated by panniculitis involving, cutaneous candiasis and MRSA tracheobronchitis. Unable to be liberated from mechanical ventilation.  Transferred to [facility] on 5/8.  INTERVAL HISTORY:  Tolerating TC during the day and vent at night.  VITAL SIGNS:  Reviewed  PHYSICAL EXAMINATION: General:  Resting comfortable, morbidly obese Neuro:  Awake, alert, communicating well HEENT:  Tracheostomy site intact Cardiovascular:  Distant heart sounds, no murmurs Lungs:  Diminished bilateral air entry Abdomen:  Erythema, soft, bowel sounds diminished Musculoskeletal:  Moves all extremities  LABS: ABG    Component Value Date/Time   PHART 7.437 02/06/2014 0900   PCO2ART 64.4* 02/06/2014 0900   PO2ART 64.7* 02/06/2014 0900   HCO3 42.7* 02/06/2014 0900   TCO2 44.7 02/06/2014 0900   O2SAT 93.2 02/06/2014 0900     Recent Labs Lab 02/17/14 0655 02/18/14 0648 02/18/14 1042  NA 135* 138 136*  K 4.1 3.9 4.0  CL 91* 90* 91*  CO2 33* 39* 40*  BUN 20 18 16   CREATININE 0.73 0.76 0.73  GLUCOSE 213* 155* 298*    Recent Labs Lab 02/17/14 0655 02/18/14 0648 02/18/14 1042  HGB 11.0* 10.3* 10.2*  HCT 33.5* 33.0* 31.1*  WBC 9.5 10.1 10.7*  PLT 131* 122* 120*     IMAGING: Dg Abd Portable 1v  02/18/2014   CLINICAL DATA:  Small bowel obstruction.  Morbid obesity.  EXAM: PORTABLE ABDOMEN - 1 VIEW  COMPARISON:  02/17/2014 and 02/16/2014  FINDINGS: Nasogastric tube is seen with tip in the proximal stomach. Exam is limited by morbid obesity. Decreased in  dilated bowel loops noted compared to previous studies.  IMPRESSION: Decreased in dilated bowel loops compared to recent exams. Nasogastric tube tip in proximal stomach. Technically suboptimal due to morbid obesity.   Electronically Signed   By: Earle Gell M.D.   On: 02/18/2014 12:11    ASSESSMENT / PLAN:  Acute on chronic respiratory failure OSA / OHS MRSA bronchitis Pulmonary edema Tracheostomy status Morbid obesity  Discussion Think she is at baseline pulm status which should be vent @ night ATC day time. She will require vent/SNF  Plan  Supplemental oxygen via trach collar for goal SpO2>92, during day  Nocturnal ventilatory support, suspect will need it chronically, so appears appropriate for vent SNF  Aim for negative volume status  Would avoid opioids ( respiratory depression / delirium ) and benzodiazepines ( delirium ); use atypical antipsychotics instead  Continued full support at night in future could revisit this if patient lost significant amount of weight.  WT loss and PT/OT.  D/C today to vent SNF.  Rush Farmer, M.D. [facility] Pulmonary/Critical Care Medicine. Pager: [phone]. After hours pager: [phone].

## 2014-02-24 LAB — CULTURE, BLOOD (ROUTINE X 2)
CULTURE: NO GROWTH
Culture: NO GROWTH

## 2017-09-29 ENCOUNTER — Encounter (HOSPITAL_COMMUNITY): Payer: Self-pay | Admitting: Emergency Medicine

## 2017-09-29 ENCOUNTER — Other Ambulatory Visit: Payer: Self-pay

## 2017-09-29 ENCOUNTER — Emergency Department (HOSPITAL_COMMUNITY): Payer: Medicare Other

## 2017-09-29 ENCOUNTER — Emergency Department (HOSPITAL_COMMUNITY)
Admission: EM | Admit: 2017-09-29 | Discharge: 2017-09-29 | Disposition: A | Discharge status: Home / Self Care | Payer: Medicare Other | Attending: Emergency Medicine | Admitting: Emergency Medicine

## 2017-09-29 DIAGNOSIS — I10 Essential (primary) hypertension: Secondary | ICD-10-CM | POA: Diagnosis not present

## 2017-09-29 DIAGNOSIS — Z794 Long term (current) use of insulin: Secondary | ICD-10-CM | POA: Diagnosis not present

## 2017-09-29 DIAGNOSIS — R079 Chest pain, unspecified: Secondary | ICD-10-CM | POA: Diagnosis present

## 2017-09-29 DIAGNOSIS — Z79899 Other long term (current) drug therapy: Secondary | ICD-10-CM | POA: Diagnosis not present

## 2017-09-29 DIAGNOSIS — E119 Type 2 diabetes mellitus without complications: Secondary | ICD-10-CM | POA: Insufficient documentation

## 2017-09-29 LAB — BASIC METABOLIC PANEL
Anion gap: 13 (ref 5–15)
BUN: 16 mg/dL (ref 6–20)
CO2: 30 mmol/L (ref 22–32)
Calcium: 9.4 mg/dL (ref 8.9–10.3)
Chloride: 94 mmol/L — ABNORMAL LOW (ref 101–111)
Creatinine, Ser: 0.92 mg/dL (ref 0.44–1.00)
GFR calc Af Amer: >60 mL/min (ref >60)
GFR calc non Af Amer: >60 mL/min (ref >60)
Glucose, Bld: 278 mg/dL — ABNORMAL HIGH (ref 65–99)
Potassium: 4.5 mmol/L (ref 3.5–5.1)
Sodium: 137 mmol/L (ref 135–145)

## 2017-09-29 LAB — CBC WITH DIFFERENTIAL/PLATELET
Basophils Absolute: 0 10*3/uL (ref 0.0–0.1)
Basophils Relative: 0 %
Eosinophils Absolute: 0.1 10*3/uL (ref 0.0–0.7)
Eosinophils Relative: 2 %
HCT: 37.3 % (ref 36.0–46.0)
Hemoglobin: 11.5 g/dL — ABNORMAL LOW (ref 12.0–15.0)
Lymphocytes Relative: 33 %
Lymphs Abs: 2.4 10*3/uL (ref 0.7–4.0)
MCH: 28.3 pg (ref 26.0–34.0)
MCHC: 30.8 g/dL (ref 30.0–36.0)
MCV: 91.9 fL (ref 78.0–100.0)
Monocytes Absolute: 1 10*3/uL (ref 0.1–1.0)
Monocytes Relative: 13 %
Neutro Abs: 3.8 10*3/uL (ref 1.7–7.7)
Neutrophils Relative %: 52 %
Platelets: 146 10*3/uL — ABNORMAL LOW (ref 150–400)
RBC: 4.06 MIL/uL (ref 3.87–5.11)
RDW: 16.5 % — ABNORMAL HIGH (ref 11.5–15.5)
WBC: 7.3 10*3/uL (ref 4.0–10.5)

## 2017-09-29 LAB — TROPONIN I: Troponin I: <0.03 ng/mL (ref <0.03)

## 2017-09-29 MED ORDER — ONDANSETRON HCL 4 MG/2ML IJ SOLN
4.0000 mg | Freq: Once | INTRAMUSCULAR | Status: AC
  Administered 2017-09-29: 4 mg via INTRAVENOUS
  Filled 2017-09-29: qty 2

## 2017-09-29 MED ORDER — PROMETHAZINE HCL 25 MG/ML IJ SOLN
12.5000 mg | Freq: Once | INTRAMUSCULAR | Status: AC
  Administered 2017-09-29: 12.5 mg via INTRAVENOUS
  Filled 2017-09-29: qty 1

## 2017-09-29 NOTE — ED Notes (Signed)
Pt given zofran, states that phenergan is what she normally takes and zofran doesn't work.

## 2017-09-29 NOTE — ED Notes (Signed)
Pt requesting Phenergan, Dr. Wilson Singer notified.

## 2017-09-29 NOTE — ED Triage Notes (Signed)
Pt to ED [facility]IA [facility] from [facility]-- with pt c/o chest pain, started today-- received NTG x 2 from [facility]-- pain went from 8/10 to 7/10.  Pt is trached/vented

## 2017-09-29 NOTE — ED Provider Notes (Signed)
[place] EMERGENCY DEPARTMENT Provider Note   CSN: [id number] Arrival date & time: 09/29/17  1303     History   Chief Complaint Chief Complaint  Patient presents with  . Chest Pain    HPI Kim Jenkins is a 52 y.o. female.  HPI   52 year old female with chest pain.  She is coming from [facility] skilled care unit where she began having chest pain at rest shortly before arrival.  Substernal.  No radiation.  As a past history of obesity hypoventilation syndrome, obstructive sleep apnea and she is morbidly obese.  She is trached and vented at baseline.  She reports no increased dyspnea but has been coughing more recently.  She denies any fevers.  Past Medical History:  Diagnosis Date  . Bipolar 1 disorder ([place])   . Chronic pain   . Depression   . Diabetes mellitus ([place])   . Hypertension   . Kidney stones   . Morbid obesity ([place])   . Obesity hypoventilation syndrome ([place])   . Obstructive sleep apnea   . Panniculitis     Patient Active Problem List   Diagnosis Date Noted  . Tracheostomy status ([place]) 02/17/2014  . Morbid obesity ([place]) 02/17/2014  . Acute on chronic respiratory failure ([place]) 01/23/2014  . Acute encephalopathy 01/23/2014    Past Surgical History:  Procedure Laterality Date  . RIGHT OOPHORECTOMY    . TRACHEOSTOMY    . VENTRAL HERNIA REPAIR      OB History    No data available       Home Medications    Prior to Admission medications   Medication Sig Start Date End Date Taking? Authorizing Provider  albuterol (PROVENTIL HFA;VENTOLIN HFA) 108 (90 BASE) MCG/ACT inhaler Inhale 2 puffs into the lungs every 6 (six) hours as needed for wheezing or shortness of breath.    [provider]  buPROPion (WELLBUTRIN) 100 MG tablet Take 100 mg by mouth every morning.    [provider]  butalbital-acetaminophen-caffeine (FIORICET, ESGIC) 50-325-40 MG per tablet Take 1 tablet by mouth every 4 (four) hours as needed for headache.     [provider]  Calcium Carbonate-Vit D-Min (CALCIUM 600+D PLUS MINERALS) 600-400 MG-UNIT TABS Take 1 tablet by mouth 2 (two) times daily.    [provider]  clonazePAM (KLONOPIN) 0.5 MG tablet Take 0.5 mg by mouth every 12 (twelve) hours.    [provider]  cyclobenzaprine (FLEXERIL) 5 MG tablet Take 5 mg by mouth 3 (three) times daily.    [provider]  divalproex (DEPAKOTE ER) 250 MG 24 hr tablet Take 250 mg by mouth at bedtime.    [provider]  Divalproex Sodium (DEPAKOTE PO) Take 625 mg by mouth 2 (two) times daily.    [provider]  doxycycline 100 mg in dextrose 5 % 250 mL Inject 100 mg into the vein every 12 (twelve) hours.    [provider]  DULoxetine (CYMBALTA) 30 MG capsule Take 30 mg by mouth 2 (two) times daily.    [provider]  enoxaparin (LOVENOX) 40 MG/0.4ML injection Inject 40 mg into the skin every 12 (twelve) hours.    [provider]  famotidine (PEPCID) 40 MG tablet Take 40 mg by mouth at bedtime.    [provider]  fluconazole (DIFLUCAN) 200 MG/100ML IVPB Inject 200 mg into the vein daily.    [provider]  Fluticasone-Salmeterol (ADVAIR) 250-50 MCG/DOSE AEPB Inhale 1 puff into the lungs 2 (  two) times daily.    [provider]  gabapentin (NEURONTIN) 600 MG tablet Take 600 mg by mouth 3 (three) times daily.    [provider]  insulin detemir (LEVEMIR) 100 UNIT/ML injection Inject 70 Units into the skin 2 (two) times daily.    [provider]  loratadine (CLARITIN) 10 MG tablet Take 10 mg by mouth daily as needed for allergies.    [provider]  metoprolol tartrate (LOPRESSOR) 25 MG tablet Take 12.5 mg by mouth 2 (two) times daily.    [provider]  oxyCODONE (OXY IR/ROXICODONE) 5 MG immediate release tablet Take 5 mg by mouth every 4 (four) hours as needed for moderate pain (for pain level 1>5).    [provider]  Oxycodone HCl 10 MG TABS Take 10 mg by mouth every 6 (six) hours as needed (for pain level 5>10).    [provider]  polyethylene glycol (MIRALAX / GLYCOLAX) packet Take 17 g by mouth daily.    [provider]  promethazine 25 mg in sodium chloride 0.9 % 1,000 mL Inject 12.5 mg into the vein every 6 (six) hours as needed (for nausea).    [provider]  senna (SENOKOT) 8.6 MG tablet Take 1 tablet by mouth 2 (two) times daily.    [provider]  spironolactone (ALDACTONE) 25 MG tablet Take 25 mg by mouth every morning.    [provider]  traZODone (DESYREL) 50 MG tablet Take 75 mg by mouth at bedtime.    [provider]  Vancomycin HCl in Dextrose (VANCOCIN HCL IV) Inject 2,000 mg into the vein every 12 (twelve) hours.    [provider]  zolpidem (AMBIEN) 5 MG tablet Take 5 mg by mouth at bedtime as needed for sleep.    [provider]    Family History No family history on file.  Social History Social History   Tobacco Use  . Smoking status: Never Smoker  . Smokeless tobacco: Never Used  Substance Use Topics  . Alcohol use: No    Frequency: Never  . Drug use: No     Allergies   Cefazolin and Meperidine and related   Review of Systems Review of Systems  All systems reviewed and negative, other than as noted in HPI.  Physical Exam Updated Vital Signs BP 102/62   Pulse 85   Temp 98.4 F (36.9 C) (Oral)   Resp 20   Ht 5\' 7"  (1.702 m)   Wt (!) 181.9 kg (401 lb)   SpO2 98%   BMI 62.81 kg/m   Physical Exam  Constitutional: She appears well-developed and well-nourished. No distress.  Laying in bed. Mouthing words. NAD.   HENT:  Head: Normocephalic and atraumatic.  Eyes: Conjunctivae are normal. Right eye exhibits no discharge. Left eye exhibits no discharge.  Neck: Neck supple.  Cardiovascular: Normal rate, regular rhythm and normal heart sounds. Exam reveals no gallop and no  friction rub.  No murmur heard. Pulmonary/Chest: Effort normal. No respiratory distress.  Trach/vent. Thick white/tan secretions suctioned.  Decreased breath sounds bilaterally with questionable rhonchi.  Abdominal: Soft. She exhibits no distension. There is no tenderness.  Musculoskeletal: She exhibits no edema or tenderness.  Neurological: She is alert.  Skin: Skin is warm and dry.  Psychiatric: She has a normal mood and affect. Her behavior is normal. Thought content normal.  Nursing note and vitals reviewed.    ED Treatments / Results  Labs (all labs ordered are  listed, but only abnormal results are displayed) Labs Reviewed  CBC WITH DIFFERENTIAL/PLATELET - Abnormal; Notable for the following components:      Result Value   Hemoglobin 11.5 (*)    RDW 16.5 (*)    Platelets 146 (*)    All other components within normal limits  BASIC METABOLIC PANEL - Abnormal; Notable for the following components:   Chloride 94 (*)    Glucose, Bld 278 (*)    All other components within normal limits  TROPONIN I    EKG  EKG Interpretation  Date/Time:  Saturday September 29 2017 13:06:00 EST Ventricular Rate:  100 PR Interval:    QRS Duration: 66 QT Interval:  418 QTC Calculation: 540 R Axis:   76 Text Interpretation:  Sinus tachycardia Nonspecific T abnormalities, lateral leads Confirmed by Virgel Manifold [phone]) on 09/29/2017 2:52:34 PM       Radiology Dg Chest Portable 1 View  Result Date: 09/29/2017 CLINICAL DATA:  Left-sided chest pain. EXAM: PORTABLE CHEST 1 VIEW COMPARISON:  02/07/2014 FINDINGS: Tracheostomy present. Stable mild cardiac enlargement. Bibasilar atelectasis present. No overt pulmonary edema or focal airspace consolidation. No pleural fluid or pneumothorax identified. IMPRESSION: Mild cardiomegaly and bibasilar atelectasis. Electronically Signed   By: Aletta Edouard M.D.   On: 09/29/2017 14:10    Procedures Procedures (including critical care time)  Medications  Ordered in ED Medications  ondansetron (ZOFRAN) injection 4 mg (4 mg Intravenous Given 09/29/17 1330)  promethazine (PHENERGAN) injection 12.5 mg (12.5 mg Intravenous Given 09/29/17 1430)     Initial Impression / Assessment and Plan / ED Course  I have reviewed the triage vital signs and the nursing notes.  Pertinent labs & imaging results that were available during my care of the patient were reviewed by me and considered in my medical decision making (see chart for details).     52 year old female with chest pain.  Now improved, not resolved.  She reports increased coughing recently.  Suspect that this may be musculoskeletal pain.  EKG without overt ischemic changes.  troponin is normal.  Chest x-ray is without acute abnormality.  Final Clinical Impressions(s) / ED Diagnoses   Final diagnoses:  None    ED Discharge Orders    None       Virgel Manifold, MD 09/29/17 1523

## 2017-09-29 NOTE — Progress Notes (Signed)
Placed pt on vent settings from [facility] gave to RT by [facility] RN in report.

## 2017-09-29 NOTE — ED Notes (Signed)
Spoke with Kim Jenkins from [facility] to update facility and notify pt will be returning shortly.

## 2017-09-29 NOTE — ED Notes (Signed)
Pt stable, states understanding of discharge instructions, Maudry Mayhew at [facility] updated and notified of return

## 2018-05-05 ENCOUNTER — Inpatient Hospital Stay (HOSPITAL_COMMUNITY)
Admission: EM | Admit: 2018-05-05 | Discharge: 2018-05-09 | DRG: 377 | Disposition: A | Discharge status: Other Acute Inpatient Hospital | Payer: Medicare Other | Attending: Internal Medicine | Admitting: Internal Medicine

## 2018-05-05 ENCOUNTER — Other Ambulatory Visit: Payer: Self-pay

## 2018-05-05 ENCOUNTER — Encounter (HOSPITAL_COMMUNITY): Payer: Self-pay | Admitting: Internal Medicine

## 2018-05-05 ENCOUNTER — Inpatient Hospital Stay (HOSPITAL_COMMUNITY): Payer: Medicare Other

## 2018-05-05 DIAGNOSIS — L89313 Pressure ulcer of right buttock, stage 3: Secondary | ICD-10-CM | POA: Diagnosis present

## 2018-05-05 DIAGNOSIS — Z6841 Body Mass Index (BMI) 40.0 and over, adult: Secondary | ICD-10-CM | POA: Diagnosis not present

## 2018-05-05 DIAGNOSIS — D696 Thrombocytopenia, unspecified: Secondary | ICD-10-CM | POA: Diagnosis present

## 2018-05-05 DIAGNOSIS — Z7951 Long term (current) use of inhaled steroids: Secondary | ICD-10-CM | POA: Diagnosis not present

## 2018-05-05 DIAGNOSIS — D62 Acute posthemorrhagic anemia: Secondary | ICD-10-CM | POA: Diagnosis not present

## 2018-05-05 DIAGNOSIS — E662 Morbid (severe) obesity with alveolar hypoventilation: Secondary | ICD-10-CM | POA: Diagnosis present

## 2018-05-05 DIAGNOSIS — R1310 Dysphagia, unspecified: Secondary | ICD-10-CM | POA: Diagnosis present

## 2018-05-05 DIAGNOSIS — K920 Hematemesis: Secondary | ICD-10-CM | POA: Diagnosis not present

## 2018-05-05 DIAGNOSIS — K264 Chronic or unspecified duodenal ulcer with hemorrhage: Secondary | ICD-10-CM | POA: Diagnosis present

## 2018-05-05 DIAGNOSIS — J9622 Acute and chronic respiratory failure with hypercapnia: Secondary | ICD-10-CM | POA: Diagnosis present

## 2018-05-05 DIAGNOSIS — K922 Gastrointestinal hemorrhage, unspecified: Secondary | ICD-10-CM | POA: Diagnosis present

## 2018-05-05 DIAGNOSIS — K269 Duodenal ulcer, unspecified as acute or chronic, without hemorrhage or perforation: Secondary | ICD-10-CM | POA: Diagnosis not present

## 2018-05-05 DIAGNOSIS — G894 Chronic pain syndrome: Secondary | ICD-10-CM | POA: Diagnosis present

## 2018-05-05 DIAGNOSIS — Z430 Encounter for attention to tracheostomy: Secondary | ICD-10-CM

## 2018-05-05 DIAGNOSIS — I10 Essential (primary) hypertension: Secondary | ICD-10-CM | POA: Diagnosis present

## 2018-05-05 DIAGNOSIS — Z930 Tracheostomy status: Secondary | ICD-10-CM | POA: Diagnosis not present

## 2018-05-05 DIAGNOSIS — J9612 Chronic respiratory failure with hypercapnia: Secondary | ICD-10-CM

## 2018-05-05 DIAGNOSIS — G4733 Obstructive sleep apnea (adult) (pediatric): Secondary | ICD-10-CM | POA: Diagnosis present

## 2018-05-05 DIAGNOSIS — F329 Major depressive disorder, single episode, unspecified: Secondary | ICD-10-CM | POA: Diagnosis present

## 2018-05-05 DIAGNOSIS — N179 Acute kidney failure, unspecified: Secondary | ICD-10-CM | POA: Diagnosis present

## 2018-05-05 DIAGNOSIS — D72829 Elevated white blood cell count, unspecified: Secondary | ICD-10-CM | POA: Diagnosis present

## 2018-05-05 DIAGNOSIS — E119 Type 2 diabetes mellitus without complications: Secondary | ICD-10-CM

## 2018-05-05 DIAGNOSIS — R0689 Other abnormalities of breathing: Secondary | ICD-10-CM | POA: Diagnosis not present

## 2018-05-05 DIAGNOSIS — J9621 Acute and chronic respiratory failure with hypoxia: Secondary | ICD-10-CM | POA: Diagnosis present

## 2018-05-05 DIAGNOSIS — Z452 Encounter for adjustment and management of vascular access device: Secondary | ICD-10-CM

## 2018-05-05 DIAGNOSIS — F32A Depression, unspecified: Secondary | ICD-10-CM | POA: Diagnosis present

## 2018-05-05 DIAGNOSIS — Z794 Long term (current) use of insulin: Secondary | ICD-10-CM | POA: Diagnosis not present

## 2018-05-05 DIAGNOSIS — R0902 Hypoxemia: Secondary | ICD-10-CM | POA: Diagnosis not present

## 2018-05-05 DIAGNOSIS — K439 Ventral hernia without obstruction or gangrene: Secondary | ICD-10-CM | POA: Diagnosis present

## 2018-05-05 DIAGNOSIS — Z9911 Dependence on respirator [ventilator] status: Secondary | ICD-10-CM

## 2018-05-05 DIAGNOSIS — G9340 Encephalopathy, unspecified: Secondary | ICD-10-CM | POA: Diagnosis not present

## 2018-05-05 DIAGNOSIS — R578 Other shock: Secondary | ICD-10-CM | POA: Diagnosis present

## 2018-05-05 DIAGNOSIS — E872 Acidosis, unspecified: Secondary | ICD-10-CM

## 2018-05-05 DIAGNOSIS — Z79899 Other long term (current) drug therapy: Secondary | ICD-10-CM

## 2018-05-05 DIAGNOSIS — J9611 Chronic respiratory failure with hypoxia: Secondary | ICD-10-CM

## 2018-05-05 DIAGNOSIS — K219 Gastro-esophageal reflux disease without esophagitis: Secondary | ICD-10-CM | POA: Diagnosis present

## 2018-05-05 DIAGNOSIS — F319 Bipolar disorder, unspecified: Secondary | ICD-10-CM | POA: Diagnosis present

## 2018-05-05 DIAGNOSIS — L8990 Pressure ulcer of unspecified site, unspecified stage: Secondary | ICD-10-CM

## 2018-05-05 DIAGNOSIS — G8929 Other chronic pain: Secondary | ICD-10-CM | POA: Diagnosis present

## 2018-05-05 LAB — CBC WITH DIFFERENTIAL/PLATELET
ABS IMMATURE GRANULOCYTES: 0.1 10*3/uL (ref 0.0–0.1)
BASOS ABS: 0 10*3/uL (ref 0.0–0.1)
BASOS PCT: 0 %
Eosinophils Absolute: 0 10*3/uL (ref 0.0–0.7)
Eosinophils Relative: 0 %
HCT: 21.4 % — ABNORMAL LOW (ref 36.0–46.0)
Hemoglobin: 6.2 g/dL — CL (ref 12.0–15.0)
IMMATURE GRANULOCYTES: 1 %
LYMPHS PCT: 20 %
Lymphs Abs: 2.5 10*3/uL (ref 0.7–4.0)
MCH: 27.4 pg (ref 26.0–34.0)
MCHC: 29 g/dL — ABNORMAL LOW (ref 30.0–36.0)
MCV: 94.7 fL (ref 78.0–100.0)
MONOS PCT: 11 %
Monocytes Absolute: 1.4 10*3/uL — ABNORMAL HIGH (ref 0.1–1.0)
NEUTROS ABS: 8.4 10*3/uL — AB (ref 1.7–7.7)
NEUTROS PCT: 68 %
PLATELETS: 237 10*3/uL (ref 150–400)
RBC: 2.26 MIL/uL — ABNORMAL LOW (ref 3.87–5.11)
RDW: 17.4 % — ABNORMAL HIGH (ref 11.5–15.5)
WBC: 12.4 10*3/uL — ABNORMAL HIGH (ref 4.0–10.5)

## 2018-05-05 LAB — PROTIME-INR
INR: 1.01
INR: 1.04
Prothrombin Time: 13.2 seconds (ref 11.4–15.2)
Prothrombin Time: 13.5 s (ref 11.4–15.2)

## 2018-05-05 LAB — CBC
HEMATOCRIT: 35 % — AB (ref 36.0–46.0)
Hemoglobin: 11.1 g/dL — ABNORMAL LOW (ref 12.0–15.0)
MCH: 28.4 pg (ref 26.0–34.0)
MCHC: 31.7 g/dL (ref 30.0–36.0)
MCV: 89.5 fL (ref 78.0–100.0)
Platelets: 135 10*3/uL — ABNORMAL LOW (ref 150–400)
RBC: 3.91 MIL/uL (ref 3.87–5.11)
RDW: 16.1 % — AB (ref 11.5–15.5)
WBC: 9.5 10*3/uL (ref 4.0–10.5)

## 2018-05-05 LAB — GLUCOSE, CAPILLARY: Glucose-Capillary: 245 mg/dL — ABNORMAL HIGH (ref 70–99)

## 2018-05-05 LAB — COMPREHENSIVE METABOLIC PANEL
ALBUMIN: 2.2 g/dL — AB (ref 3.5–5.0)
ALT: 13 U/L (ref 0–44)
AST: 17 U/L (ref 15–41)
Alkaline Phosphatase: 42 U/L (ref 38–126)
Anion gap: 10 (ref 5–15)
BILIRUBIN TOTAL: 0.5 mg/dL (ref 0.3–1.2)
BUN: 58 mg/dL — ABNORMAL HIGH (ref 6–20)
CO2: 33 mmol/L — ABNORMAL HIGH (ref 22–32)
Calcium: 7.3 mg/dL — ABNORMAL LOW (ref 8.9–10.3)
Chloride: 91 mmol/L — ABNORMAL LOW (ref 98–111)
Creatinine, Ser: 1.1 mg/dL — ABNORMAL HIGH (ref 0.44–1.00)
GFR calc Af Amer: >60 mL/min (ref >60)
GFR calc non Af Amer: 56 mL/min — ABNORMAL LOW (ref >60)
GLUCOSE: 309 mg/dL — AB (ref 70–99)
Potassium: 4.2 mmol/L (ref 3.5–5.1)
SODIUM: 134 mmol/L — AB (ref 135–145)
TOTAL PROTEIN: 5.4 g/dL — AB (ref 6.5–8.1)

## 2018-05-05 LAB — I-STAT CHEM 8, ED
BUN: 60 mg/dL — AB (ref 6–20)
CALCIUM ION: 0.94 mmol/L — AB (ref 1.15–1.40)
CHLORIDE: 89 mmol/L — AB (ref 98–111)
CREATININE: 1 mg/dL (ref 0.44–1.00)
GLUCOSE: 311 mg/dL — AB (ref 70–99)
HCT: 34 % — ABNORMAL LOW (ref 36.0–46.0)
Hemoglobin: 11.6 g/dL — ABNORMAL LOW (ref 12.0–15.0)
POTASSIUM: 4.3 mmol/L (ref 3.5–5.1)
Sodium: 137 mmol/L (ref 135–145)
TCO2: 33 mmol/L — ABNORMAL HIGH (ref 22–32)

## 2018-05-05 LAB — BASIC METABOLIC PANEL
ANION GAP: 10 (ref 5–15)
BUN: 63 mg/dL — ABNORMAL HIGH (ref 6–20)
CO2: 38 mmol/L — ABNORMAL HIGH (ref 22–32)
Calcium: 8.5 mg/dL — ABNORMAL LOW (ref 8.9–10.3)
Chloride: 87 mmol/L — ABNORMAL LOW (ref 98–111)
Creatinine, Ser: 1.17 mg/dL — ABNORMAL HIGH (ref 0.44–1.00)
GFR calc Af Amer: >60 mL/min (ref >60)
GFR, EST NON AFRICAN AMERICAN: 52 mL/min — AB (ref >60)
Glucose, Bld: 369 mg/dL — ABNORMAL HIGH (ref 70–99)
POTASSIUM: 4.4 mmol/L (ref 3.5–5.1)
SODIUM: 135 mmol/L (ref 135–145)

## 2018-05-05 LAB — PROCALCITONIN: Procalcitonin: 0.36 ng/mL

## 2018-05-05 LAB — MAGNESIUM: Magnesium: 1.9 mg/dL (ref 1.7–2.4)

## 2018-05-05 LAB — LACTIC ACID, PLASMA: Lactic Acid, Venous: 2.3 mmol/L (ref 0.5–1.9)

## 2018-05-05 LAB — URINALYSIS, ROUTINE W REFLEX MICROSCOPIC
Bilirubin Urine: NEGATIVE
Glucose, UA: 50 mg/dL — AB
Hgb urine dipstick: NEGATIVE
Ketones, ur: NEGATIVE mg/dL
Leukocytes, UA: NEGATIVE
Nitrite: NEGATIVE
Protein, ur: NEGATIVE mg/dL
Specific Gravity, Urine: 1.017 (ref 1.005–1.030)
pH: 5 (ref 5.0–8.0)

## 2018-05-05 LAB — HEPATIC FUNCTION PANEL
ALBUMIN: 2.7 g/dL — AB (ref 3.5–5.0)
ALT: 17 U/L (ref 0–44)
AST: 20 U/L (ref 15–41)
Alkaline Phosphatase: 54 U/L (ref 38–126)
Bilirubin, Direct: <0.1 mg/dL (ref 0.0–0.2)
TOTAL PROTEIN: 7.2 g/dL (ref 6.5–8.1)
Total Bilirubin: 0.6 mg/dL (ref 0.3–1.2)

## 2018-05-05 LAB — PHOSPHORUS: PHOSPHORUS: 3.1 mg/dL (ref 2.5–4.6)

## 2018-05-05 LAB — LACTATE DEHYDROGENASE: LDH: 119 U/L (ref 98–192)

## 2018-05-05 LAB — I-STAT ARTERIAL BLOOD GAS, ED
Acid-Base Excess: 9 mmol/L — ABNORMAL HIGH (ref 0.0–2.0)
Bicarbonate: 36.1 mmol/L — ABNORMAL HIGH (ref 20.0–28.0)
O2 SAT: 97 %
PH ART: 7.345 — AB (ref 7.350–7.450)
TCO2: 38 mmol/L — AB (ref 22–32)
pCO2 arterial: 65.4 mmHg (ref 32.0–48.0)
pO2, Arterial: 91 mmHg (ref 83.0–108.0)

## 2018-05-05 LAB — I-STAT CG4 LACTIC ACID, ED: Lactic Acid, Venous: 3.26 mmol/L (ref 0.5–1.9)

## 2018-05-05 LAB — APTT: aPTT: 26 s (ref 24–36)

## 2018-05-05 LAB — BRAIN NATRIURETIC PEPTIDE: B Natriuretic Peptide: 22.1 pg/mL (ref 0.0–100.0)

## 2018-05-05 LAB — AMMONIA: AMMONIA: 20 umol/L (ref 9–35)

## 2018-05-05 LAB — FIBRINOGEN: Fibrinogen: 375 mg/dL (ref 210–475)

## 2018-05-05 LAB — ABO/RH: ABO/RH(D): O POS

## 2018-05-05 LAB — LIPASE, BLOOD: Lipase: 62 U/L — ABNORMAL HIGH (ref 11–51)

## 2018-05-05 MED ORDER — CALCIUM GLUCONATE 10 % IV SOLN
INTRAVENOUS | Status: AC
  Filled 2018-05-05: qty 10

## 2018-05-05 MED ORDER — ONDANSETRON HCL 4 MG/2ML IJ SOLN
4.0000 mg | Freq: Once | INTRAMUSCULAR | Status: AC
  Administered 2018-05-05: 4 mg via INTRAVENOUS
  Filled 2018-05-05: qty 2

## 2018-05-05 MED ORDER — OCTREOTIDE LOAD VIA INFUSION
50.0000 ug | Freq: Once | INTRAVENOUS | Status: AC
  Administered 2018-05-05: 50 ug via INTRAVENOUS
  Filled 2018-05-05: qty 25

## 2018-05-05 MED ORDER — PANTOPRAZOLE SODIUM 40 MG IV SOLR
40.0000 mg | Freq: Once | INTRAVENOUS | Status: AC
  Administered 2018-05-05: 40 mg via INTRAVENOUS
  Filled 2018-05-05: qty 40

## 2018-05-05 MED ORDER — SODIUM CHLORIDE 0.9 % IV SOLN
1.0000 g | Freq: Once | INTRAVENOUS | Status: AC
  Administered 2018-05-05: 1 g via INTRAVENOUS
  Filled 2018-05-05: qty 10

## 2018-05-05 MED ORDER — PANTOPRAZOLE SODIUM 40 MG IV SOLR
8.0000 mg/h | INTRAVENOUS | Status: DC
  Administered 2018-05-05 – 2018-05-06 (×3): 8 mg/h via INTRAVENOUS
  Filled 2018-05-05 (×7): qty 80

## 2018-05-05 MED ORDER — LEVOFLOXACIN IN D5W 750 MG/150ML IV SOLN
750.0000 mg | INTRAVENOUS | Status: DC
  Administered 2018-05-05: 750 mg via INTRAVENOUS
  Filled 2018-05-05: qty 150

## 2018-05-05 MED ORDER — SODIUM CHLORIDE 0.9 % IV SOLN: 250.0000 mL | INTRAVENOUS | Status: DC | PRN

## 2018-05-05 MED ORDER — MAGNESIUM SULFATE 2 GM/50ML IV SOLN
2.0000 g | Freq: Once | INTRAVENOUS | Status: AC
  Administered 2018-05-06: 2 g via INTRAVENOUS
  Filled 2018-05-05: qty 50

## 2018-05-05 MED ORDER — VANCOMYCIN HCL 10 G IV SOLR
2500.0000 mg | Freq: Once | INTRAVENOUS | Status: AC
  Administered 2018-05-05: 2500 mg via INTRAVENOUS
  Filled 2018-05-05: qty 2500

## 2018-05-05 MED ORDER — IOPAMIDOL (ISOVUE-300) INJECTION 61%
INTRAVENOUS | Status: AC
  Administered 2018-05-05: 100 mL
  Filled 2018-05-05: qty 100

## 2018-05-05 MED ORDER — SODIUM CHLORIDE 0.9 % IV SOLN
50.0000 ug/h | INTRAVENOUS | Status: DC
  Administered 2018-05-05: 50 ug/h via INTRAVENOUS
  Filled 2018-05-05 (×2): qty 1

## 2018-05-05 MED ORDER — LACTATED RINGERS IV BOLUS
1000.0000 mL | Freq: Once | INTRAVENOUS | Status: AC
  Administered 2018-05-05: 1000 mL via INTRAVENOUS

## 2018-05-05 MED ORDER — PANTOPRAZOLE SODIUM 40 MG IV SOLR: 40.0000 mg | Freq: Two times a day (BID) | INTRAVENOUS | Status: DC

## 2018-05-05 MED ORDER — INSULIN ASPART 100 UNIT/ML ~~LOC~~ SOLN
0.0000 [IU] | SUBCUTANEOUS | Status: DC
  Administered 2018-05-05: 5 [IU] via SUBCUTANEOUS
  Administered 2018-05-06 (×3): 2 [IU] via SUBCUTANEOUS
  Administered 2018-05-06 (×2): 3 [IU] via SUBCUTANEOUS
  Administered 2018-05-06: 5 [IU] via SUBCUTANEOUS
  Administered 2018-05-06 – 2018-05-07 (×3): 2 [IU] via SUBCUTANEOUS

## 2018-05-05 MED ORDER — SODIUM CHLORIDE 0.9 % IV SOLN: 10.0000 mL/h | Freq: Once | INTRAVENOUS | Status: DC

## 2018-05-05 MED ORDER — VANCOMYCIN HCL 10 G IV SOLR
1250.0000 mg | Freq: Two times a day (BID) | INTRAVENOUS | Status: DC
  Filled 2018-05-05: qty 1250

## 2018-05-05 NOTE — ED Notes (Signed)
Checked on patient at 17:35 as alarms were beeping; indicated hypotension. Patient still alert and oriented. Recheck of BP a couple of times (repositioning of cuff/size) patient remained hypotensive we well as decline. MD notified, NS bolus administered as well as the original LR bolus.  Mental status began to decline; responsive to sternal rub only. MTP initiated  And administered with [place] with first unit going in @ 18:10. Subsequent units: 18:13 18:19 18:28 18:31 Providers @ bedside.

## 2018-05-05 NOTE — Progress Notes (Signed)
Pharmacy Antibiotic Note  Kim Jenkins is a 52 y.o. female admitted on 05/05/2018 from [facility] with melena and coffee-ground emesis. Noted low concern for infection but wanted to empirically cover for sepsis initially. Pharmacy has been consulted for Vancomycin + Levaquin dosing.  Plan: - Vancomycin 2500 mg IV x 1 followed by 1250 mg IV every 12 hours - Levaquin 750 mg IV every 24 hours - Will continue to follow renal function, culture results, LOT, and antibiotic de-escalation plans   Weight: (!) 473 lb (214.6 kg)(Patient weighed on bed)  Temp (24hrs), Avg:97.3 F (36.3 C), Min:97.3 F (36.3 C), Max:97.3 F (36.3 C)  Recent Labs  Lab 05/05/18 1710 05/05/18 1726 05/05/18 1900 05/05/18 1915  WBC 12.4*  --  9.5  --   CREATININE 1.17*  --  1.10* 1.00  LATICACIDVEN  --  3.26*  --   --     CrCl cannot be calculated (Unknown ideal weight.).    Allergies  Allergen Reactions  . Cefazolin   . Meperidine And Related     Antimicrobials this admission: Vanc 8/18 >> LVQ 8/18 >>  Dose adjustments this admission: n/a  Microbiology results: 8/18 MRSA PCR >> 8/18 BCx >> 8/18 RCx >>  Thank you for allowing pharmacy to be a part of this patient's care.  Alycia Rossetti, PharmD, BCPS Clinical Pharmacist Pager: [phone] Clinical phone for 05/05/2018 from 7a-3:30p: [phone] If after 3:30p, please call main pharmacy at: [phone] Please check AMION for all [place] numbers 05/05/2018 9:44 PM

## 2018-05-05 NOTE — Progress Notes (Signed)
CRITICAL VALUE ALERT  Critical Value:  Lactic 2.3  Date & Time Notied:  2322 05/05/18  Provider Notified: Warren Lacy   Orders Received/Actions taken: see orders

## 2018-05-05 NOTE — H&P (View-Only) (Signed)
Consultation  Referring Provider:      critical care Primary Care Physician:  Patient, No Pcp Per Primary Gastroenterologist:        Unassigned Reason for Consultation:     GI bleed     Impression / Plan:   Upper GI bleed Hemorrhagic shock Long-term Lovenox last dose yesterday Morbid obesity with tracheostomy for obesity-hypoventilation syndrome Acute blood loss anemia  She is resusciatated with blood Hgb 6 to 11 Think this is acute/subacute CT negative for retroperitoneal bleed Stay on PPi infusion Can dc empiric octreotide Endoscopy tomorrow - sooner if needed but I do not think it needs to be done now          HPI:   Kim Jenkins is a 52 y.o. female with morbid obesity on chronic Lovenox for DVT prophylaxis for recent DVT, who had begun having melena in the last couple of days and per the ICU nurse practitioner the nursing home staff discontinued her Lovenox yesterday but she went on to have hypotension and coffee-ground emesis and more melena.  As best I can tell this has not occurred since she is been in the hospital when she presented she had soft and low blood pressures down into the 70s.  She was put on the rapid transfusion protocol and was given 9 units of blood in her hemoglobin went from 6-11.  She has normal blood pressure now in a normal pulse.  She can nod to questions and is complaining of epigastric pain that she says started after her vomiting.  Because of the tracheostomy and inability to speak it is really difficult to get a complete history otherwise.  No known history of GI bleeding.  Enhanced abdominal pelvic CT scan is done and demonstrates no evidence of a retroperitoneal bleed or other major abnormality that would be related to her current problem.  Full report is pending.  Full report does return showing absent renal contrast excretion on the delayed images no retroperitoneal hemorrhage lung base atelectasis and mild calcified aortic atherosclerosis.   Abdominal wall mesh is present.  Past Medical History:  Diagnosis Date  . Bipolar 1 disorder ([place])   . Chronic pain   . Depression   . Diabetes mellitus ([place])   . Hypertension   . Kidney stones   . Morbid obesity ([place])   . Obesity hypoventilation syndrome ([place])   . Obstructive sleep apnea   . Panniculitis     Past Surgical History:  Procedure Laterality Date  . RIGHT OOPHORECTOMY    . TRACHEOSTOMY    . VENTRAL HERNIA REPAIR     Family history is noncontributory to the current problem  Social History   Tobacco Use  . Smoking status: Never Smoker  . Smokeless tobacco: Never Used  Substance Use Topics  . Alcohol use: No    Frequency: Never  . Drug use: No    Prior to Admission medications   Medication Sig Start Date End Date Taking? Authorizing Provider  acetaminophen (TYLENOL) 325 MG tablet Take 650 mg by mouth every 6 (six) hours as needed for mild pain, moderate pain, fever or headache.   Yes [provider]  ARIPiprazole (ABILIFY) 5 MG tablet Take 5 mg by mouth daily.   Yes [provider]  bismuth subsalicylate (PEPTO BISMOL) 262 MG/15ML suspension Take 30 mLs by mouth every 8 (eight) hours as needed for indigestion or diarrhea or loose stools.   Yes [provider]  chlorhexidine (HIBICLENS) 4 % external  liquid Apply 1 application topically daily. For bath   Yes [provider]  Cholecalciferol 1000 units tablet Take 1,000 Units by mouth daily.   Yes [provider]  clonazePAM (KLONOPIN) 0.5 MG tablet Take 0.5 mg by mouth every 8 (eight) hours. In addition pt. May take 0.5mg  every 12 hours as needed for anxiety.   Yes [provider]  clotrimazole-betamethasone (LOTRISONE) cream Apply 1 application topically daily.   Yes [provider]  diclofenac sodium (VOLTAREN) 1 % GEL Apply 2 g topically every 6 (six) hours as needed (pain).   Yes [provider]  diphenhydrAMINE (BENADRYL) 25 mg capsule  Take 25 mg by mouth every 6 (six) hours as needed for itching.   Yes [provider]  divalproex (DEPAKOTE ER) 500 MG 24 hr tablet Take 1,000 mg by mouth every 12 (twelve) hours.   Yes [provider]  ergocalciferol (VITAMIN D2) 50000 units capsule Take 50,000 Units by mouth once a week.   Yes [provider]  famotidine (PEPCID) 20 MG tablet Take 20 mg by mouth at bedtime.   Yes [provider]  fluticasone (FLONASE) 50 MCG/ACT nasal spray Place 2 sprays into both nostrils daily.   Yes [provider]  guaiFENesin (MUCINEX) 600 MG 12 hr tablet Take 600 mg by mouth every 12 (twelve) hours.   Yes [provider]  insulin glargine (LANTUS) 100 UNIT/ML injection Inject 10-40 Units into the skin See admin instructions. Inject 10 units every morning and 40 units every night.   Yes [provider]  ipratropium-albuterol (DUONEB) 0.5-2.5 (3) MG/3ML SOLN Take 3 mLs by nebulization every 4 (four) hours as needed (SOB/wheezing).   Yes [provider]  lidocaine (XYLOCAINE) 2 % jelly 1 application by Tracheal Tube route every 4 (four) hours as needed (cough).   Yes [provider]  liraglutide (VICTOZA) 18 MG/3ML SOPN Inject 1.8 mg into the skin daily.   Yes [provider]  loperamide (IMODIUM A-D) 2 MG tablet Take 4 mg by mouth every 8 (eight) hours as needed for diarrhea or loose stools.   Yes [provider]  loratadine (CLARITIN) 10 MG tablet Take 10 mg by mouth daily.    Yes [provider]  metoprolol tartrate (LOPRESSOR) 25 MG tablet Take 12.5 mg by mouth 2 (two) times daily.   Yes [provider]  Multiple Vitamin (MULTIVITAMIN WITH MINERALS) TABS tablet Take 1 tablet by mouth daily.   Yes [provider]  naproxen (NAPROSYN) 250 MG tablet Take 500 mg by mouth every 12 (twelve) hours as needed for mild pain, moderate pain or headache.   Yes [provider]  nystatin  cream (MYCOSTATIN) Apply 1 application topically 3 (three) times daily. Apply under breast and abdominal fold for yeast   Yes [provider]  Olopatadine HCl (PAZEO) 0.7 % SOLN Apply 1 drop to eye at bedtime. 04/15/18 04/15/19 Yes [provider]  ondansetron (ZOFRAN-ODT) 4 MG disintegrating tablet Take 4 mg by mouth every 6 (six) hours as needed for nausea or vomiting.   Yes [provider]  pantoprazole (PROTONIX) 40 MG tablet Take 40 mg by mouth 2 (two) times daily.   Yes [provider]  Probiotic Product (PROBIOTIC PO) Take 2 capsules by mouth 2 (two) times daily.   Yes [provider]  promethazine (PHENERGAN) 25 MG tablet Take 25 mg by mouth every 6 (six) hours as needed for nausea or vomiting.   Yes [provider]  Propylene Glycol (SYSTANE BALANCE) 0.6 % SOLN Place 1 drop into both eyes every 6 (six) hours as needed (dry eyes).   Yes [provider]  Skin Protectants, Misc. (EUCERIN) cream Apply 1 application topically daily. Apply to bilateral lower extremity areas   Yes [provider]  sodium chloride (OCEAN) 0.65 % SOLN nasal spray Place 2 sprays into both nostrils every 2 (two) hours as needed for congestion.   Yes [provider]  talc (ZEASORB) powder Apply 1 application topically every 12 (twelve) hours. cleanse bilateral buttocks and ischials with normal saline, dry, and apply powder   Yes [provider]  torsemide (DEMADEX) 20 MG tablet Take 20 mg by mouth daily.   Yes [provider]  traMADol (ULTRAM) 50 MG tablet Take 50 mg by mouth every 8 (eight) hours as needed for moderate pain or severe pain.   Yes [provider]  traZODone (DESYREL) 150 MG tablet Take 150 mg by mouth at bedtime.   Yes [provider]  venlafaxine (EFFEXOR) 75 MG tablet Take 225 mg by mouth daily.   Yes [provider]    Current Facility-Administered Medications  Medication Dose  Route Frequency Provider Last Rate Last Dose  . 0.9 %  sodium chloride infusion  250 mL Intravenous PRN Omar Person, NP      . insulin aspart (novoLOG) injection 0-15 Units  0-15 Units Subcutaneous Q4H Omar Person, NP      . pantoprazole (PROTONIX) 80 mg in sodium chloride 0.9 % 250 mL (0.32 mg/mL) infusion  8 mg/hr Intravenous Continuous Omar Person, NP      . Derrill Memo ON 05/09/2018] pantoprazole (PROTONIX) injection 40 mg  40 mg Intravenous Q12H Omar Person, NP        Allergies as of 05/05/2018 - Review Complete 05/05/2018  Allergen Reaction Noted  . Cefazolin  01/22/2014  . Meperidine and related  01/22/2014     Review of Systems:    She has decubitus ulcers and some skin breakdown and edema.  She has a tracheostomy.  She wears eyeglasses.  Remainder the review of systems unable to be obtained due to her inability to speak.      Physical Exam:  Vital signs in last 24 hours: Temp:  [97.3 F (36.3 C)] 97.3 F (36.3 C) (08/18 1749) Pulse Rate:  [74-114] 75 (08/18 1950) Resp:  [15-23] 15 (08/18 2103) BP: (55-137)/(23-93) 108/65 (08/18 2103) SpO2:  [97 %-100 %] 100 % (08/18 1950) FiO2 (%):  [45 %-60 %] 60 % (08/18 1920) Weight:  [214.6 kg] 214.6 kg (08/18 1749)    General:  Acutely ill morbidly obese white woman Eyes:  anicteric. ENT:   Mouth and posterior pharynx free of lesions.  She is edentulous Neck:   Tracheostomy Lungs: Clear to auscultation bilaterally. Heart:   S1S2, no rubs, murmurs, gallops. Abdomen: Massively obese, multiple small red marks presumably injection sites from Lovenox soft, mildly-tender epigastrium, no hepatosplenomegaly, hernia, or mass and BS+.  There is a large panniculus with some induration and mild erythema Lymph:  no cervical or supraclavicular adenopathy. Extremities:   Trace bilateral lower extremity edema and mild venous stasis changes no cyanosis or clubbing Skin  see changes in abdominal wall,  Neuro:  Responds  to commands unable to really assess for orientation appears nonfocal    Data Reviewed:   LAB RESULTS: Recent Labs    05/05/18 1710 05/05/18 1900 05/05/18 1915  WBC 12.4* 9.5  --  HGB 6.2* 11.1* 11.6*  HCT 21.4* 35.0* 34.0*  PLT 237 135*  --    BMET Recent Labs    05/05/18 1710 05/05/18 1900 05/05/18 1915  NA 135 134* 137  K 4.4 4.2 4.3  CL 87* 91* 89*  CO2 38* 33*  --   GLUCOSE 369* 309* 311*  BUN 63* 58* 60*  CREATININE 1.17* 1.10* 1.00  CALCIUM 8.5* 7.3*  --    LFT Recent Labs    05/05/18 1710 05/05/18 1900  PROT 7.2 5.4*  ALBUMIN 2.7* 2.2*  AST 20 17  ALT 17 13  ALKPHOS 54 42  BILITOT 0.6 0.5  BILIDIR <0.1  --   IBILI NOT CALCULATED  --    PT/INR Recent Labs    05/05/18 1710  LABPROT 13.2  INR 1.01    STUDIES: Dg Chest Port 1 View  Result Date: 05/05/2018 CLINICAL DATA:  Central catheter placement EXAM: PORTABLE CHEST 1 VIEW COMPARISON:  September 29, 2017 FINDINGS: Endotracheal tube tip is 5.5 cm above the carina. Central catheter extends into the superior vena cava where it bends upon itself. The tip is at the junction of the left innominate vein and superior vena cava. No pneumothorax. There is mild patchy infiltrate in the medial right base. Lungs elsewhere are clear. Heart is borderline enlarged with pulmonary vascularity normal. No adenopathy. No bone lesions. IMPRESSION: Tube and catheter positions as described. Note that the central catheter bends upon itself with the tip directed back toward the left innominate vein. No pneumothorax. Patchy infiltrate medial right base. Lungs elsewhere clear. Stable cardiac prominence. Electronically Signed   By: Lowella Grip III M.D.   On: 05/05/2018 20:34     PREVIOUS ENDOSCOPIES:            None that I am aware of   Thanks   LOS: 0 days   @Omarian Jaquith  Simonne Maffucci, MD, [facility] @  05/05/2018, 9:18 PM

## 2018-05-05 NOTE — Consult Note (Addendum)
Consultation  Referring Provider:      critical care Primary Care Physician:  Patient, No Pcp Per Primary Gastroenterologist:        Unassigned Reason for Consultation:     GI bleed     Impression / Plan:   Upper GI bleed Hemorrhagic shock Long-term Lovenox last dose yesterday Morbid obesity with tracheostomy for obesity-hypoventilation syndrome Acute blood loss anemia  She is resusciatated with blood Hgb 6 to 11 Think this is acute/subacute CT negative for retroperitoneal bleed Stay on PPi infusion Can dc empiric octreotide Endoscopy tomorrow - sooner if needed but I do not think it needs to be done now          HPI:   Kim Jenkins is a 52 y.o. female with morbid obesity on chronic Lovenox for DVT prophylaxis for recent DVT, who had begun having melena in the last couple of days and per the ICU nurse practitioner the nursing home staff discontinued her Lovenox yesterday but she went on to have hypotension and coffee-ground emesis and more melena.  As best I can tell this has not occurred since she is been in the hospital when she presented she had soft and low blood pressures down into the 70s.  She was put on the rapid transfusion protocol and was given 9 units of blood in her hemoglobin went from 6-11.  She has normal blood pressure now in a normal pulse.  She can nod to questions and is complaining of epigastric pain that she says started after her vomiting.  Because of the tracheostomy and inability to speak it is really difficult to get a complete history otherwise.  No known history of GI bleeding.  Enhanced abdominal pelvic CT scan is done and demonstrates no evidence of a retroperitoneal bleed or other major abnormality that would be related to her current problem.  Full report is pending.  Full report does return showing absent renal contrast excretion on the delayed images no retroperitoneal hemorrhage lung base atelectasis and mild calcified aortic atherosclerosis.   Abdominal wall mesh is present.  Past Medical History:  Diagnosis Date  . Bipolar 1 disorder ([place])   . Chronic pain   . Depression   . Diabetes mellitus ([place])   . Hypertension   . Kidney stones   . Morbid obesity ([place])   . Obesity hypoventilation syndrome ([place])   . Obstructive sleep apnea   . Panniculitis     Past Surgical History:  Procedure Laterality Date  . RIGHT OOPHORECTOMY    . TRACHEOSTOMY    . VENTRAL HERNIA REPAIR     Family history is noncontributory to the current problem  Social History   Tobacco Use  . Smoking status: Never Smoker  . Smokeless tobacco: Never Used  Substance Use Topics  . Alcohol use: No    Frequency: Never  . Drug use: No    Prior to Admission medications   Medication Sig Start Date End Date Taking? Authorizing Provider  acetaminophen (TYLENOL) 325 MG tablet Take 650 mg by mouth every 6 (six) hours as needed for mild pain, moderate pain, fever or headache.   Yes [provider]  ARIPiprazole (ABILIFY) 5 MG tablet Take 5 mg by mouth daily.   Yes [provider]  bismuth subsalicylate (PEPTO BISMOL) 262 MG/15ML suspension Take 30 mLs by mouth every 8 (eight) hours as needed for indigestion or diarrhea or loose stools.   Yes [provider]  chlorhexidine (HIBICLENS) 4 % external  liquid Apply 1 application topically daily. For bath   Yes [provider]  Cholecalciferol 1000 units tablet Take 1,000 Units by mouth daily.   Yes [provider]  clonazePAM (KLONOPIN) 0.5 MG tablet Take 0.5 mg by mouth every 8 (eight) hours. In addition pt. May take 0.5mg  every 12 hours as needed for anxiety.   Yes [provider]  clotrimazole-betamethasone (LOTRISONE) cream Apply 1 application topically daily.   Yes [provider]  diclofenac sodium (VOLTAREN) 1 % GEL Apply 2 g topically every 6 (six) hours as needed (pain).   Yes [provider]  diphenhydrAMINE (BENADRYL) 25 mg capsule  Take 25 mg by mouth every 6 (six) hours as needed for itching.   Yes [provider]  divalproex (DEPAKOTE ER) 500 MG 24 hr tablet Take 1,000 mg by mouth every 12 (twelve) hours.   Yes [provider]  ergocalciferol (VITAMIN D2) 50000 units capsule Take 50,000 Units by mouth once a week.   Yes [provider]  famotidine (PEPCID) 20 MG tablet Take 20 mg by mouth at bedtime.   Yes [provider]  fluticasone (FLONASE) 50 MCG/ACT nasal spray Place 2 sprays into both nostrils daily.   Yes [provider]  guaiFENesin (MUCINEX) 600 MG 12 hr tablet Take 600 mg by mouth every 12 (twelve) hours.   Yes [provider]  insulin glargine (LANTUS) 100 UNIT/ML injection Inject 10-40 Units into the skin See admin instructions. Inject 10 units every morning and 40 units every night.   Yes [provider]  ipratropium-albuterol (DUONEB) 0.5-2.5 (3) MG/3ML SOLN Take 3 mLs by nebulization every 4 (four) hours as needed (SOB/wheezing).   Yes [provider]  lidocaine (XYLOCAINE) 2 % jelly 1 application by Tracheal Tube route every 4 (four) hours as needed (cough).   Yes [provider]  liraglutide (VICTOZA) 18 MG/3ML SOPN Inject 1.8 mg into the skin daily.   Yes [provider]  loperamide (IMODIUM A-D) 2 MG tablet Take 4 mg by mouth every 8 (eight) hours as needed for diarrhea or loose stools.   Yes [provider]  loratadine (CLARITIN) 10 MG tablet Take 10 mg by mouth daily.    Yes [provider]  metoprolol tartrate (LOPRESSOR) 25 MG tablet Take 12.5 mg by mouth 2 (two) times daily.   Yes [provider]  Multiple Vitamin (MULTIVITAMIN WITH MINERALS) TABS tablet Take 1 tablet by mouth daily.   Yes [provider]  naproxen (NAPROSYN) 250 MG tablet Take 500 mg by mouth every 12 (twelve) hours as needed for mild pain, moderate pain or headache.   Yes [provider]  nystatin  cream (MYCOSTATIN) Apply 1 application topically 3 (three) times daily. Apply under breast and abdominal fold for yeast   Yes [provider]  Olopatadine HCl (PAZEO) 0.7 % SOLN Apply 1 drop to eye at bedtime. 04/15/18 04/15/19 Yes [provider]  ondansetron (ZOFRAN-ODT) 4 MG disintegrating tablet Take 4 mg by mouth every 6 (six) hours as needed for nausea or vomiting.   Yes [provider]  pantoprazole (PROTONIX) 40 MG tablet Take 40 mg by mouth 2 (two) times daily.   Yes [provider]  Probiotic Product (PROBIOTIC PO) Take 2 capsules by mouth 2 (two) times daily.   Yes [provider]  promethazine (PHENERGAN) 25 MG tablet Take 25 mg by mouth every 6 (six) hours as needed for nausea or vomiting.   Yes [provider]  Propylene Glycol (SYSTANE BALANCE) 0.6 % SOLN Place 1 drop into both eyes every 6 (six) hours as needed (dry eyes).   Yes [provider]  Skin Protectants, Misc. (EUCERIN) cream Apply 1 application topically daily. Apply to bilateral lower extremity areas   Yes [provider]  sodium chloride (OCEAN) 0.65 % SOLN nasal spray Place 2 sprays into both nostrils every 2 (two) hours as needed for congestion.   Yes [provider]  talc (ZEASORB) powder Apply 1 application topically every 12 (twelve) hours. cleanse bilateral buttocks and ischials with normal saline, dry, and apply powder   Yes [provider]  torsemide (DEMADEX) 20 MG tablet Take 20 mg by mouth daily.   Yes [provider]  traMADol (ULTRAM) 50 MG tablet Take 50 mg by mouth every 8 (eight) hours as needed for moderate pain or severe pain.   Yes [provider]  traZODone (DESYREL) 150 MG tablet Take 150 mg by mouth at bedtime.   Yes [provider]  venlafaxine (EFFEXOR) 75 MG tablet Take 225 mg by mouth daily.   Yes [provider]    Current Facility-Administered Medications  Medication Dose  Route Frequency Provider Last Rate Last Dose  . 0.9 %  sodium chloride infusion  250 mL Intravenous PRN Omar Person, NP      . insulin aspart (novoLOG) injection 0-15 Units  0-15 Units Subcutaneous Q4H Omar Person, NP      . pantoprazole (PROTONIX) 80 mg in sodium chloride 0.9 % 250 mL (0.32 mg/mL) infusion  8 mg/hr Intravenous Continuous Omar Person, NP      . Derrill Memo ON 05/09/2018] pantoprazole (PROTONIX) injection 40 mg  40 mg Intravenous Q12H Omar Person, NP        Allergies as of 05/05/2018 - Review Complete 05/05/2018  Allergen Reaction Noted  . Cefazolin  01/22/2014  . Meperidine and related  01/22/2014     Review of Systems:    She has decubitus ulcers and some skin breakdown and edema.  She has a tracheostomy.  She wears eyeglasses.  Remainder the review of systems unable to be obtained due to her inability to speak.      Physical Exam:  Vital signs in last 24 hours: Temp:  [97.3 F (36.3 C)] 97.3 F (36.3 C) (08/18 1749) Pulse Rate:  [74-114] 75 (08/18 1950) Resp:  [15-23] 15 (08/18 2103) BP: (55-137)/(23-93) 108/65 (08/18 2103) SpO2:  [97 %-100 %] 100 % (08/18 1950) FiO2 (%):  [45 %-60 %] 60 % (08/18 1920) Weight:  [214.6 kg] 214.6 kg (08/18 1749)    General:  Acutely ill morbidly obese white woman Eyes:  anicteric. ENT:   Mouth and posterior pharynx free of lesions.  She is edentulous Neck:   Tracheostomy Lungs: Clear to auscultation bilaterally. Heart:   S1S2, no rubs, murmurs, gallops. Abdomen: Massively obese, multiple small red marks presumably injection sites from Lovenox soft, mildly-tender epigastrium, no hepatosplenomegaly, hernia, or mass and BS+.  There is a large panniculus with some induration and mild erythema Lymph:  no cervical or supraclavicular adenopathy. Extremities:   Trace bilateral lower extremity edema and mild venous stasis changes no cyanosis or clubbing Skin  see changes in abdominal wall,  Neuro:  Responds  to commands unable to really assess for orientation appears nonfocal    Data Reviewed:   LAB RESULTS: Recent Labs    05/05/18 1710 05/05/18 1900 05/05/18 1915  WBC 12.4* 9.5  --  HGB 6.2* 11.1* 11.6*  HCT 21.4* 35.0* 34.0*  PLT 237 135*  --    BMET Recent Labs    05/05/18 1710 05/05/18 1900 05/05/18 1915  NA 135 134* 137  K 4.4 4.2 4.3  CL 87* 91* 89*  CO2 38* 33*  --   GLUCOSE 369* 309* 311*  BUN 63* 58* 60*  CREATININE 1.17* 1.10* 1.00  CALCIUM 8.5* 7.3*  --    LFT Recent Labs    05/05/18 1710 05/05/18 1900  PROT 7.2 5.4*  ALBUMIN 2.7* 2.2*  AST 20 17  ALT 17 13  ALKPHOS 54 42  BILITOT 0.6 0.5  BILIDIR <0.1  --   IBILI NOT CALCULATED  --    PT/INR Recent Labs    05/05/18 1710  LABPROT 13.2  INR 1.01    STUDIES: Dg Chest Port 1 View  Result Date: 05/05/2018 CLINICAL DATA:  Central catheter placement EXAM: PORTABLE CHEST 1 VIEW COMPARISON:  September 29, 2017 FINDINGS: Endotracheal tube tip is 5.5 cm above the carina. Central catheter extends into the superior vena cava where it bends upon itself. The tip is at the junction of the left innominate vein and superior vena cava. No pneumothorax. There is mild patchy infiltrate in the medial right base. Lungs elsewhere are clear. Heart is borderline enlarged with pulmonary vascularity normal. No adenopathy. No bone lesions. IMPRESSION: Tube and catheter positions as described. Note that the central catheter bends upon itself with the tip directed back toward the left innominate vein. No pneumothorax. Patchy infiltrate medial right base. Lungs elsewhere clear. Stable cardiac prominence. Electronically Signed   By: Lowella Grip III M.D.   On: 05/05/2018 20:34     PREVIOUS ENDOSCOPIES:            None that I am aware of   Thanks   LOS: 0 days   @Carl  Simonne Maffucci, MD, [facility] @  05/05/2018, 9:18 PM

## 2018-05-05 NOTE — Procedures (Signed)
Central Venous Catheter Insertion Procedure Note Kim Jenkins [id number] Apr 03, 1966  Procedure: Insertion of Central Venous Catheter Indications: Assessment of intravascular volume, Drug and/or fluid administration and Frequent blood sampling  Procedure Details Consent: Unable to obtain consent because of emergent medical necessity. Time Out: Verified patient identification, verified procedure, site/side was marked, verified correct patient position, special equipment/implants available, medications/allergies/relevent history reviewed, required imaging and test results available.  Performed  Maximum sterile technique was used including antiseptics, cap, gloves, gown, hand hygiene, mask and sheet. Skin prep: Chlorhexidine; local anesthetic administered A antimicrobial bonded/coated triple lumen catheter was placed in the left internal jugular vein using the Seldinger technique.  Evaluation Blood flow good Complications: No apparent complications Patient did tolerate procedure well. Chest X-ray ordered to verify placement.  CXR: normal.  Renee Pain, MD Board Certified by the ABIM, Pulmonary Diseases & Critical Care Medicine  05/05/2018, 8:44 PM

## 2018-05-05 NOTE — ED Notes (Addendum)
Critical care at bedside - attempting additional peripheral access Subsequent bags of PRBCs hung at: Bag # 7 @ 18:31 #18:40 #18:55 #19:00

## 2018-05-05 NOTE — ED Triage Notes (Signed)
Pt arrives from [facility] via [facility] reporting dark, tarry emesis and stools x 2 days LUQ and RUQ tenderness.  Pt is trached, uses vent at night but not during day, vented for transport.  Pt alert on arrival, c/o SOB. RT at bedside.

## 2018-05-05 NOTE — ED Provider Notes (Signed)
[name] EMERGENCY DEPARTMENT Provider Note   CSN: [id number] Arrival date & time: 05/05/18  1655     History   Chief Complaint Chief Complaint  Patient presents with  . GI Bleeding    HPI Kim Jenkins is a 52 y.o. female with diabetes, depression, morbid obesity who presents via EMS from her living facility due to 2 days of black stools and coffee-ground emesis.  She is trach dependent.  She is nonverbal and is unable to provide any further information.  I attempted to contact the facility as well as family but was unable to obtain any further information.  She is unable to write or communicate in any other way.  EMS reports that she recently had abdominal surgery.  No additional details are known about this.  HPI  Past Medical History:  Diagnosis Date  . Bipolar 1 disorder ([place])   . Chronic pain   . Depression   . Diabetes mellitus ([place])   . Hypertension   . Kidney stones   . Morbid obesity ([place])   . Obesity hypoventilation syndrome ([place])   . Obstructive sleep apnea   . Panniculitis     Patient Active Problem List   Diagnosis Date Noted  . GI bleed 05/05/2018  . Tracheostomy status ([place]) 02/17/2014  . Morbid obesity ([place]) 02/17/2014  . Acute on chronic respiratory failure ([place]) 01/23/2014  . Acute encephalopathy 01/23/2014    Past Surgical History:  Procedure Laterality Date  . RIGHT OOPHORECTOMY    . TRACHEOSTOMY    . VENTRAL HERNIA REPAIR       OB History   None      Home Medications    Prior to Admission medications   Medication Sig Start Date End Date Taking? Authorizing Provider  acetaminophen (TYLENOL) 325 MG tablet Take 650 mg by mouth every 6 (six) hours as needed for mild pain, moderate pain, fever or headache.   Yes [provider]  ARIPiprazole (ABILIFY) 5 MG tablet Take 5 mg by mouth daily.   Yes [provider]  bismuth subsalicylate (PEPTO BISMOL) 262 MG/15ML suspension Take 30 mLs by mouth every 8  (eight) hours as needed for indigestion or diarrhea or loose stools.   Yes [provider]  chlorhexidine (HIBICLENS) 4 % external liquid Apply 1 application topically daily. For bath   Yes [provider]  Cholecalciferol 1000 units tablet Take 1,000 Units by mouth daily.   Yes [provider]  clonazePAM (KLONOPIN) 0.5 MG tablet Take 0.5 mg by mouth every 8 (eight) hours. In addition pt. May take 0.5mg  every 12 hours as needed for anxiety.   Yes [provider]  clotrimazole-betamethasone (LOTRISONE) cream Apply 1 application topically daily.   Yes [provider]  diclofenac sodium (VOLTAREN) 1 % GEL Apply 2 g topically every 6 (six) hours as needed (pain).   Yes [provider]  diphenhydrAMINE (BENADRYL) 25 mg capsule Take 25 mg by mouth every 6 (six) hours as needed for itching.   Yes [provider]  divalproex (DEPAKOTE ER) 500 MG 24 hr tablet Take 1,000 mg by mouth every 12 (twelve) hours.   Yes [provider]  ergocalciferol (VITAMIN D2) 50000 units capsule Take 50,000 Units by mouth once a week.   Yes [provider]  famotidine (PEPCID) 20 MG tablet Take 20 mg by mouth at bedtime.   Yes [provider]  fluticasone (FLONASE) 50 MCG/ACT nasal spray Place 2 sprays into both nostrils daily.  Yes [provider]  guaiFENesin (MUCINEX) 600 MG 12 hr tablet Take 600 mg by mouth every 12 (twelve) hours.   Yes [provider]  insulin glargine (LANTUS) 100 UNIT/ML injection Inject 10-40 Units into the skin See admin instructions. Inject 10 units every morning and 40 units every night.   Yes [provider]  ipratropium-albuterol (DUONEB) 0.5-2.5 (3) MG/3ML SOLN Take 3 mLs by nebulization every 4 (four) hours as needed (SOB/wheezing).   Yes [provider]  lidocaine (XYLOCAINE) 2 % jelly 1 application by Tracheal Tube route every 4 (four) hours as needed (cough).   Yes  [provider]  liraglutide (VICTOZA) 18 MG/3ML SOPN Inject 1.8 mg into the skin daily.   Yes [provider]  loperamide (IMODIUM A-D) 2 MG tablet Take 4 mg by mouth every 8 (eight) hours as needed for diarrhea or loose stools.   Yes [provider]  loratadine (CLARITIN) 10 MG tablet Take 10 mg by mouth daily.    Yes [provider]  metoprolol tartrate (LOPRESSOR) 25 MG tablet Take 12.5 mg by mouth 2 (two) times daily.   Yes [provider]  Multiple Vitamin (MULTIVITAMIN WITH MINERALS) TABS tablet Take 1 tablet by mouth daily.   Yes [provider]  naproxen (NAPROSYN) 250 MG tablet Take 500 mg by mouth every 12 (twelve) hours as needed for mild pain, moderate pain or headache.   Yes [provider]  nystatin cream (MYCOSTATIN) Apply 1 application topically 3 (three) times daily. Apply under breast and abdominal fold for yeast   Yes [provider]  Olopatadine HCl (PAZEO) 0.7 % SOLN Apply 1 drop to eye at bedtime. 04/15/18 04/15/19 Yes [provider]  ondansetron (ZOFRAN-ODT) 4 MG disintegrating tablet Take 4 mg by mouth every 6 (six) hours as needed for nausea or vomiting.   Yes [provider]  pantoprazole (PROTONIX) 40 MG tablet Take 40 mg by mouth 2 (two) times daily.   Yes [provider]  Probiotic Product (PROBIOTIC PO) Take 2 capsules by mouth 2 (two) times daily.   Yes [provider]  promethazine (PHENERGAN) 25 MG tablet Take 25 mg by mouth every 6 (six) hours as needed for nausea or vomiting.   Yes [provider]  Propylene Glycol (SYSTANE BALANCE) 0.6 % SOLN Place 1 drop into both eyes every 6 (six) hours as needed (dry eyes).   Yes [provider]  Skin Protectants, Misc. (EUCERIN) cream Apply 1 application topically daily. Apply to bilateral lower extremity areas   Yes [provider]  sodium chloride (OCEAN) 0.65 % SOLN nasal spray Place 2 sprays  into both nostrils every 2 (two) hours as needed for congestion.   Yes [provider]  talc (ZEASORB) powder Apply 1 application topically every 12 (twelve) hours. cleanse bilateral buttocks and ischials with normal saline, dry, and apply powder   Yes [provider]  torsemide (DEMADEX) 20 MG tablet Take 20 mg by mouth daily.   Yes [provider]  traMADol (ULTRAM) 50 MG tablet Take 50 mg by mouth every 8 (eight) hours as needed for moderate pain or severe pain.   Yes [provider]  traZODone (DESYREL) 150 MG tablet Take 150 mg by mouth at bedtime.   Yes [provider]  venlafaxine (EFFEXOR) 75 MG tablet Take 225 mg by mouth daily.   Yes [provider]    Family History No family history on file.  Social History Social  History   Tobacco Use  . Smoking status: Never Smoker  . Smokeless tobacco: Never Used  Substance Use Topics  . Alcohol use: No    Frequency: Never  . Drug use: No     Allergies   Cefazolin and Meperidine and related   Review of Systems Review of Systems Unable to obtain due to the patient's nonverbal status and limited communicative ability.  Physical Exam Updated Vital Signs BP (!) 95/54   Pulse 75   Temp (!) 97.3 F (36.3 C) (Temporal)   Resp 20   Wt (!) 214.6 kg Comment: Patient weighed on bed  SpO2 100%   BMI 74.08 kg/m   Physical Exam Physical Exam Constitutional  Nursing notes reviewed  Vital signs reviewed  Morbidly obese  HEENT  No obvious trauma  Supple without meningismus, mass, or overt JVD  No scleral icterus or injection  Trach site clean and well appearing  Respiratory  Effort normal  Breath sounds present - unable to describe due to body habitus  No respiratory distress  CV  Normal rate  No obvious murmurs  Significant non-pitting edema  Abdomen  Soft  Tender in the RUQ and LLQ  Pannus explored and no gross abnormalities  MSK  Atraumatic  No  obvious deformity  ROM appropriate  Skin  Warm  Dry  Neuro  Awake and alert  EOMI  Moving all extremities  Psychiatric  Mood and affect normal        ED Treatments / Results  Labs (all labs ordered are listed, but only abnormal results are displayed) Labs Reviewed  CBC WITH DIFFERENTIAL/PLATELET - Abnormal; Notable for the following components:      Result Value   WBC 12.4 (*)    RBC 2.26 (*)    Hemoglobin 6.2 (*)    HCT 21.4 (*)    MCHC 29.0 (*)    RDW 17.4 (*)    Neutro Abs 8.4 (*)    Monocytes Absolute 1.4 (*)    All other components within normal limits  BASIC METABOLIC PANEL - Abnormal; Notable for the following components:   Chloride 87 (*)    CO2 38 (*)    Glucose, Bld 369 (*)    BUN 63 (*)    Creatinine, Ser 1.17 (*)    Calcium 8.5 (*)    GFR calc non Af Amer 52 (*)    All other components within normal limits  HEPATIC FUNCTION PANEL - Abnormal; Notable for the following components:   Albumin 2.7 (*)    All other components within normal limits  LIPASE, BLOOD - Abnormal; Notable for the following components:   Lipase 62 (*)    All other components within normal limits  CBC - Abnormal; Notable for the following components:   Hemoglobin 11.1 (*)    HCT 35.0 (*)    RDW 16.1 (*)    Platelets 135 (*)    All other components within normal limits  COMPREHENSIVE METABOLIC PANEL - Abnormal; Notable for the following components:   Sodium 134 (*)    Chloride 91 (*)    CO2 33 (*)    Glucose, Bld 309 (*)    BUN 58 (*)    Creatinine, Ser 1.10 (*)    Calcium 7.3 (*)    Total Protein 5.4 (*)    Albumin 2.2 (*)    GFR calc non Af Amer 56 (*)    All other components within normal limits  I-STAT CG4 LACTIC ACID, ED -  Abnormal; Notable for the following components:   Lactic Acid, Venous 3.26 (*)    All other components within normal limits  I-STAT CHEM 8, ED - Abnormal; Notable for the following components:   Chloride 89 (*)    BUN 60 (*)    Glucose, Bld  311 (*)    Calcium, Ion 0.94 (*)    TCO2 33 (*)    Hemoglobin 11.6 (*)    HCT 34.0 (*)    All other components within normal limits  I-STAT ARTERIAL BLOOD GAS, ED - Abnormal; Notable for the following components:   pH, Arterial 7.345 (*)    pCO2 arterial 65.4 (*)    Bicarbonate 36.1 (*)    TCO2 38 (*)    Acid-Base Excess 9.0 (*)    All other components within normal limits  PROTIME-INR  AMMONIA  LACTATE DEHYDROGENASE  BLOOD GAS, VENOUS  HIV ANTIBODY (ROUTINE TESTING)  CBC  CBC  CBC  CBC  BASIC METABOLIC PANEL  MAGNESIUM  PHOSPHORUS  LACTIC ACID, PLASMA  LACTIC ACID, PLASMA  FIBRINOGEN  PROTIME-INR  APTT  TYPE AND SCREEN  ABO/RH  PREPARE FRESH FROZEN PLASMA  PREPARE PLATELET PHERESIS  PREPARE RBC (CROSSMATCH)    EKG EKG Interpretation  Date/Time:  Sunday May 05 2018 17:11:34 EDT Ventricular Rate:  107 PR Interval:    QRS Duration: 75 QT Interval:  319 QTC Calculation: 426 R Axis:   68 Text Interpretation:  Sinus tachycardia Nonspecific T abnormalities, diffuse leads Confirmed by Elnora Morrison [phone]) on 05/05/2018 5:49:06 PM   Radiology Dg Chest Port 1 View  Result Date: 05/05/2018 CLINICAL DATA:  Central catheter placement EXAM: PORTABLE CHEST 1 VIEW COMPARISON:  September 29, 2017 FINDINGS: Endotracheal tube tip is 5.5 cm above the carina. Central catheter extends into the superior vena cava where it bends upon itself. The tip is at the junction of the left innominate vein and superior vena cava. No pneumothorax. There is mild patchy infiltrate in the medial right base. Lungs elsewhere are clear. Heart is borderline enlarged with pulmonary vascularity normal. No adenopathy. No bone lesions. IMPRESSION: Tube and catheter positions as described. Note that the central catheter bends upon itself with the tip directed back toward the left innominate vein. No pneumothorax. Patchy infiltrate medial right base. Lungs elsewhere clear. Stable cardiac prominence.  Electronically Signed   By: Lowella Grip III M.D.   On: 05/05/2018 20:34    Procedures Procedures (including critical care time)  Medications Ordered in ED Medications  octreotide (SANDOSTATIN) 2 mcg/mL load via infusion 50 mcg (50 mcg Intravenous Bolus from Bag 05/05/18 1907)    And  octreotide (SANDOSTATIN) 500 mcg in sodium chloride 0.9 % 250 mL (2 mcg/mL) infusion (50 mcg/hr Intravenous New Bag/Given 05/05/18 1931)  0.9 %  sodium chloride infusion (has no administration in time range)  pantoprazole (PROTONIX) 80 mg in sodium chloride 0.9 % 250 mL (0.32 mg/mL) infusion (has no administration in time range)  pantoprazole (PROTONIX) injection 40 mg (has no administration in time range)  insulin aspart (novoLOG) injection 0-15 Units (has no administration in time range)  lactated ringers bolus 1,000 mL (0 mLs Intravenous Stopped 05/05/18 1908)  pantoprazole (PROTONIX) injection 40 mg (40 mg Intravenous Given 05/05/18 1736)  ondansetron (ZOFRAN) injection 4 mg (4 mg Intravenous Given 05/05/18 1740)  calcium gluconate 1 g in sodium chloride 0.9 % 100 mL IVPB (1 g Intravenous New Bag/Given 05/05/18 2006)  iopamidol (ISOVUE-300) 61 % injection (100 mLs  Contrast Given 05/05/18 2033)  Initial Impression / Assessment and Plan / ED Course  I have reviewed the triage vital signs and the nursing notes.  Pertinent labs & imaging results that were available during my care of the patient were reviewed by me and considered in my medical decision making (see chart for details).     Katheline Brendlinger presents with concern for a GI bleed.  She is unable to communicate.  I have attempted multiple times to reach family and her care facility.  I was unable to obtain any additional information.  Details regarding her supposed recent surgery are also unavailable.  Her hemoglobin is 6.2.  Plt 237.  INR 1. Lipase 62.  ECG showed no acute ischemia or dysrhythmia.  On the initial assessment, she was not  tachycardic and was hemodynamically stable.  She was given IV Protonix and IV fluids.   On reassessment, she was hypotensive with a blood pressure in the 70s.  She was initially given whole blood and PRBCs.  After 4 units, she remained hypotensive and MTP was activated.  She was given octreotide, IV fluids, and NG/OG tube was inserted.  She was also given IV calcium during blood product resuscitation.  TXA was also given.  She remained unstable.  Critical care and GI were consulted.  Critical care place a central line in the ED. GI is coming to see her.  She was admitted to the critical care service for further care.  At the time of critical care admission, CT abdomen pelvis is pending.  Final Clinical Impressions(s) / ED Diagnoses   Final diagnoses:  Acute GI bleeding  Hemorrhagic shock (HCC)  Lactic acidemia    ED Discharge Orders    None       Alford Highland, MD 05/05/18 2039    Elnora Morrison, MD 05/06/18 [phone]

## 2018-05-05 NOTE — ED Notes (Addendum)
Large bore central line placed. Xray verified placement. Platelets completed infusing; bags completed as follow: #19:10 #19:40 #19:50 Patient responds appropriately.

## 2018-05-05 NOTE — Progress Notes (Signed)
Patient transported from ED trauma to CT and to 0E23 with no complications. RT gave report to ICU RT.

## 2018-05-05 NOTE — H&P (Addendum)
HISTORY & PHYSICAL  Patient Name: Kim Jenkins MRN: [id number] DOB: July 22, 1966    ADMISSION DATE:  05/05/2018 DATE OF SERVICE:  05/05/2018  CHIEF COMPLAINT:  Melena   HISTORY OF PRESENT ILLNESS  This 52 y.o. morbidly obese Caucasian female presented to the [facility] Emergency Department from [place] (where she has been getting Lovenox for DVT prophylaxis) with complaints of melena and coffee-ground emesis, which reportedly started last night (about 24 hours ago). On presentation, the patient was initially normotensive but subsequently experienced a precipitous drop in blood pressure. She was started on massive transfusion protocol and has received 9 units PRBC and 3 units platelets. At this time, she has SBP in the 110s and is NOT requiring vasopressors. She has a tracheostomy and is night-time ventilator dependent. Nonetheless, she is arousable and interactive at this time. Answers yes/no appropriately and follows commands.  She is a resident at [facility], where she has been receiving care since 2015. She is night-time ventilator dependent due to morbid obesity and severe obstructive sleep apnea. She is reported to have a history of GERD and heart failure, in addition to those conditions indicated below in her past medical history.  REVIEW OF SYSTEMS Constitutional: No weight loss. No night sweats. No fever. No chills. No fatigue. HEENT: No headaches, dysphagia, sore throat, otalgia, nasal congestion, PND CV:  No chest pain, orthopnea, PND, swelling in lower extremities, palpitations GI:  Nausea. Coffee-ground emesis. Melena. No abdominal pain. Resp: Night-time ventilator dependent. No hemoptysis.  GU: no dysuria, change in color of urine, no urgency or frequency.  No flank pain. MS:  No joint pain or swelling. No myalgias,  No decreased range of motion.  Psych:  No change in mood or affect. No memory loss. Skin: no rash or lesions.   PAST  MEDICAL/SURGICAL/SOCIAL/FAMILY HISTORIES   Past Medical History:  Diagnosis Date  . Bipolar 1 disorder ([place])   . Chronic pain   . Depression   . Diabetes mellitus ([place])   . Hypertension   . Kidney stones   . Morbid obesity ([place])   . Obesity hypoventilation syndrome ([place])   . Obstructive sleep apnea   . Panniculitis     Past Surgical History:  Procedure Laterality Date  . RIGHT OOPHORECTOMY    . TRACHEOSTOMY    . VENTRAL HERNIA REPAIR      Social History   Tobacco Use  . Smoking status: Never Smoker  . Smokeless tobacco: Never Used  Substance Use Topics  . Alcohol use: No    Frequency: Never    No family history on file.   Allergies  Allergen Reactions  . Cefazolin   . Meperidine And Related     Prior to Admission medications   Medication Sig Start Date End Date Taking? Authorizing Provider  albuterol (PROVENTIL HFA;VENTOLIN HFA) 108 (90 BASE) MCG/ACT inhaler Inhale 2 puffs into the lungs every 6 (six) hours as needed for wheezing or shortness of breath.    [provider]  buPROPion (WELLBUTRIN) 100 MG tablet Take 100 mg by mouth every morning.    [provider]  butalbital-acetaminophen-caffeine (FIORICET, ESGIC) 50-325-40 MG per tablet Take 1 tablet by mouth every 4 (four) hours as needed for headache.    [provider]  Calcium Carbonate-Vit D-Min (CALCIUM 600+D PLUS MINERALS) 600-400 MG-UNIT TABS Take 1 tablet by mouth 2 (two) times daily.    [provider]  clonazePAM (KLONOPIN) 0.5 MG tablet Take 0.5 mg by mouth every 12 (twelve)  hours.    [provider]  cyclobenzaprine (FLEXERIL) 5 MG tablet Take 5 mg by mouth 3 (three) times daily.    [provider]  divalproex (DEPAKOTE ER) 250 MG 24 hr tablet Take 250 mg by mouth at bedtime.    [provider]  Divalproex Sodium (DEPAKOTE PO) Take 625 mg by mouth 2 (two) times daily.    [provider]  doxycycline 100 mg in dextrose 5 % 250 mL  Inject 100 mg into the vein every 12 (twelve) hours.    [provider]  DULoxetine (CYMBALTA) 30 MG capsule Take 30 mg by mouth 2 (two) times daily.    [provider]  enoxaparin (LOVENOX) 40 MG/0.4ML injection Inject 40 mg into the skin every 12 (twelve) hours.    [provider]  famotidine (PEPCID) 40 MG tablet Take 40 mg by mouth at bedtime.    [provider]  fluconazole (DIFLUCAN) 200 MG/100ML IVPB Inject 200 mg into the vein daily.    [provider]  Fluticasone-Salmeterol (ADVAIR) 250-50 MCG/DOSE AEPB Inhale 1 puff into the lungs 2 (two) times daily.    [provider]  gabapentin (NEURONTIN) 600 MG tablet Take 600 mg by mouth 3 (three) times daily.    [provider]  insulin detemir (LEVEMIR) 100 UNIT/ML injection Inject 70 Units into the skin 2 (two) times daily.    [provider]  loratadine (CLARITIN) 10 MG tablet Take 10 mg by mouth daily as needed for allergies.    [provider]  metoprolol tartrate (LOPRESSOR) 25 MG tablet Take 12.5 mg by mouth 2 (two) times daily.    [provider]  oxyCODONE (OXY IR/ROXICODONE) 5 MG immediate release tablet Take 5 mg by mouth every 4 (four) hours as needed for moderate pain (for pain level 1>5).    [provider]  Oxycodone HCl 10 MG TABS Take 10 mg by mouth every 6 (six) hours as needed (for pain level 5>10).    [provider]  polyethylene glycol (MIRALAX / GLYCOLAX) packet Take 17 g by mouth daily.    [provider]  promethazine 25 mg in sodium chloride 0.9 % 1,000 mL Inject 12.5 mg into the vein every 6 (six) hours as needed (for nausea).    [provider]  senna (SENOKOT) 8.6 MG tablet Take 1 tablet by mouth 2 (two) times daily.    [provider]  spironolactone (ALDACTONE) 25 MG tablet Take 25 mg by mouth every morning.    [provider]  traZODone (DESYREL) 50 MG tablet Take 75 mg by  mouth at bedtime.    [provider]  Vancomycin HCl in Dextrose (VANCOCIN HCL IV) Inject 2,000 mg into the vein every 12 (twelve) hours.    [provider]  zolpidem (AMBIEN) 5 MG tablet Take 5 mg by mouth at bedtime as needed for sleep.    [provider]    Current Facility-Administered Medications  Medication Dose Route Frequency Provider Last Rate Last Dose  . 0.9 %  sodium chloride infusion  250 mL Intravenous PRN Omar Person, NP      . insulin aspart (novoLOG) injection 0-15 Units  0-15 Units Subcutaneous Q4H Omar Person, NP      . pantoprazole (PROTONIX) 80 mg in sodium chloride 0.9 % 250 mL (0.32 mg/mL) infusion  8 mg/hr Intravenous Continuous Omar Person, NP      . Derrill Memo ON 05/09/2018] pantoprazole (PROTONIX) injection  40 mg  40 mg Intravenous Q12H Omar Person, NP        VITAL SIGNS: BP (!) 95/54   Pulse 75   Temp (!) 97.3 F (36.3 C) (Temporal)   Resp 20   Wt (!) 214.6 kg Comment: Patient weighed on bed  SpO2 100%   BMI 74.08 kg/m   VENTILATOR SETTINGS: Vent Mode: PRVC FiO2 (%):  [45 %-60 %] 60 % Set Rate:  [20 bmp] 20 bmp Vt Set:  [500 mL] 500 mL PEEP:  [5 cmH20] 5 cmH20 Plateau Pressure:  [21 cmH20-24 cmH20] 21 cmH20  INTAKE / OUTPUT: No intake/output data recorded.  PHYSICAL EXAMINATION: GENERAL: affect appropriate to mood, cooperative, easily arousable. Morbidly obese.  HEAD: normocephalic, atraumatic EYE: PERRLA, EOM intact, no scleral icterus, no pallor. NOSE: nares are patent.  No exudate. THROAT/ORAL CAVITY: Normal dentition. No oral thrush. No exudate. Mucous membranes are moist. No tonsillar enlargement. Mallampati class IV (only hard palate visible) airway. NECK: supple, no thyromegaly, no JVD, no lymphadenopathy. Trachea midline. 6.0 [place]iley XLT Proximal. CHEST/LUNG: symmetric in development and expansion. Good air entry. no crackles. No wheezes. HEART: Distant heart sounds, likely owing to body  habitus. Regular S1 and S2 without murmur, rub or gallop. ABDOMEN:  Pendulous with huge pannus.  Soft, nontender, nondistended. Normoactive bowel sounds. No rebound. No guarding. No hepatosplenomegaly. EXTREMITIES: Edema: 2+ and pitting. No cyanosis. No clubbing. 2+ DP pulses LYMPHATIC: no cervical/axillary/inguinal lymph nodes appreciated MUSCULOSKELETAL: No point tenderness. No bulk atrophy. Joints: no joint tenderness, deformity or swelling. SKIN:  Stage III L inner thigh NEUROLOGIC: Doll's eyes intact. Corneal reflex intact. Spontaneous respirations intact. Cranial nerves II-XII are grossly symmetric and physiologic. Babinski absent. No sensory deficit. Motor: 5/5 @ RUE, 5/5 @ LUE, 4/5 @ RLL,  4/5 @ LLL.  DTR: 2+ @ R biceps, 2+ @ L biceps, 2+ @ R patellar,  2+ @ L patellar. No cerebellar signs. Gait was not assessed.   LABS:  BASIC METABOLIC PROFILE Recent Labs  Lab 05/05/18 1710 05/05/18 1900 05/05/18 1915  NA 135 134* 137  K 4.4 4.2 4.3  CL 87* 91* 89*  CO2 38* 33*  --   BUN 63* 58* 60*  CREATININE 1.17* 1.10* 1.00  GLUCOSE 369* 309* 311*  CALCIUM 8.5* 7.3*  --     Liver Enzymes Recent Labs  Lab 05/05/18 1710 05/05/18 1900  AST 20 17  ALT 17 13  ALKPHOS 54 42  BILITOT 0.6 0.5  ALBUMIN 2.7* 2.2*    CBC Recent Labs  Lab 05/05/18 1710 05/05/18 1900 05/05/18 1915  WBC 12.4* 9.5  --   HGB 6.2* 11.1* 11.6*  HCT 21.4* 35.0* 34.0*  PLT 237 135*  --     COAGULATION STUDIES Recent Labs  Lab 05/05/18 1710  INR 1.01    SEPSIS MARKERS Recent Labs  Lab 05/05/18 1726  LATICACIDVEN 3.26*    ABG Recent Labs  Lab 05/05/18 1839  PHART 7.345*  PCO2ART 65.4*  PO2ART 91.0    Cardiac Enzymes No results for input(s): TROPONINI, PROBNP in the last 168 hours.  Imaging No results found.    SIGNIFICANT EVENTS: 8/18: admitted with coffee-ground emesis and melena. Massive transfusion in the ER (9 units PRBC, 3 units platelets).  LINES/TUBES: 8/18: L IJ  rapid infuser (large bore CVC) inserted   ASSESSMENT / PLAN: Principal Problem:   Hemorrhagic shock ([place]) Active Problems:   GI bleed   Acute blood loss anemia   Morbid obesity ([place])  Acute kidney injury ([place])   Chronic respiratory failure with hypoxia and hypercapnia (H[facility])   Tracheostomy care ([place])   OSA (obstructive sleep apnea)   DM2 (diabetes mellitus, type 2) (HCC)   Bipolar 1 disorder (H[facility])   Chronic pain   Depression   By systems:  GASTROINTESTINAL  Hemorrhagic shock secondary to GI bleed, probably UGI GI consultation PENDING Protonix gtt.   HEMATOLOGIC  Acute blood loss anemia Successfully transfused to Hgb 11. Monitor Hgb q 6 hr. CBC, PT, PTT. DVT prophylaxis: SCDs  PULMONARY  Chronic respiratory failure/ventilator dependent  Obesity-hypoventilation syndrome s/p tracheostomy  Obstructive sleep apnea Brovana/Pulmicort [facility] night time vent settings: SIMV 20/500/45/5 + PSV 12 No sedation should be needed at this time. However, home meds are noted to include CNS medications (see below in NEUROLOGIC/PSYCHIATRIC). Precedex gtt PRN until GI evaluation completed.   CARDIOVASCULAR  Hypotension secondary to acute blood loss (with baseline essential hypertension) Hold antihypertensive medications at this time   ENDOCRINE  Type 2 diabetes mellitus  Morbid obesity There is some discrepancy about her actual home regimen (Lantus 10 units q am and 40 units q pm vs Levemir 70 units subcutaneously BID). Will start Lantus 10 units subcutaneously q pm with additional sliding scale insulin coverage for now.   NEUROLOGIC/PSYCHIATRIC  Bipolar 1 disorder  Chronic pain syndrome  Depression Home meds include oxycodone, Cymbalta, Fiorcet, Flexeril, Klonopin, Depakote ER, Desyrel and Wellbutrin.  RENAL  Acute kidney injury Monitor Cr.   INFECTIOUS  LEUKOCYTOSIS, likely reactive Culture (blood, urine, trach aspirate). Will start empiric  (levofloxacin/vancomycin). Check procalcitonin. Low threshold to discontinue antibiotic therapy.    Admit to ICU under my service (Attending: Renee Pain, MD) with the diagnoses highlighted above in the active Hospital Problem List (ASSESSMENT). NUTRITION: NPO PENDING GI evaluation. Awaiting MAR and med list from [place].  FAMILY  - Updates: no family at bedside  - Inter-disciplinary family meet or Palliative Care meeting due by:  05/12/2018   Critical care time: 90 minutes.  The treatment and management of the patient's condition was required based on the threat of imminent deterioration. This time reflects time spent by the physician evaluating, providing care and managing the critically ill patient's care. The time was spent at the immediate bedside (or on the same floor/unit and dedicated to this patient's care). Time involved in separately billable procedures is NOT included int he critical care time indicated above. Family meeting and update time may be included above if and only if the patient is unable/incompetent to participate in clinical interview and/or decision making, and the discussion was necessary to determining treatment decisions.   Renee Pain, MD Board Certified by the ABIM, [place] Pager: [phone]  05/05/2018, 8:08 PM

## 2018-05-05 NOTE — Progress Notes (Signed)
Sputum collected and sent to the lab

## 2018-05-06 ENCOUNTER — Encounter (HOSPITAL_COMMUNITY)
Admission: EM | Disposition: A | Discharge status: Nursing Home (Includes ICF) | Payer: Self-pay | Source: Home / Self Care | Attending: Pulmonary Disease

## 2018-05-06 ENCOUNTER — Encounter (HOSPITAL_COMMUNITY): Payer: Self-pay | Admitting: Gastroenterology

## 2018-05-06 DIAGNOSIS — K269 Duodenal ulcer, unspecified as acute or chronic, without hemorrhage or perforation: Secondary | ICD-10-CM

## 2018-05-06 DIAGNOSIS — L8990 Pressure ulcer of unspecified site, unspecified stage: Secondary | ICD-10-CM

## 2018-05-06 DIAGNOSIS — G9340 Encephalopathy, unspecified: Secondary | ICD-10-CM

## 2018-05-06 DIAGNOSIS — R0689 Other abnormalities of breathing: Secondary | ICD-10-CM

## 2018-05-06 DIAGNOSIS — J9621 Acute and chronic respiratory failure with hypoxia: Secondary | ICD-10-CM

## 2018-05-06 HISTORY — PX: ESOPHAGOGASTRODUODENOSCOPY: SHX5428

## 2018-05-06 LAB — BPAM PLATELET PHERESIS
BLOOD PRODUCT EXPIRATION DATE: 201908192359
BLOOD PRODUCT EXPIRATION DATE: 201908192359
ISSUE DATE / TIME: 201908181842
Unit Type and Rh: 5100
Unit Type and Rh: 5100

## 2018-05-06 LAB — BPAM FFP
BLOOD PRODUCT EXPIRATION DATE: 201908202359
BLOOD PRODUCT EXPIRATION DATE: 201908232359
BLOOD PRODUCT EXPIRATION DATE: 201908232359
Blood Product Expiration Date: 201908202359
Blood Product Expiration Date: 201908212359
Blood Product Expiration Date: 201908212359
Blood Product Expiration Date: 201908232359
Blood Product Expiration Date: 201908232359
Blood Product Expiration Date: 201908232359
Blood Product Expiration Date: 201908232359
Blood Product Expiration Date: 201908232359
Blood Product Expiration Date: 201908232359
ISSUE DATE / TIME: 201908181832
ISSUE DATE / TIME: 201908181832
ISSUE DATE / TIME: 201908181908
ISSUE DATE / TIME: 201908181908
ISSUE DATE / TIME: 201908181832
ISSUE DATE / TIME: 201908181832
ISSUE DATE / TIME: 201908181908
ISSUE DATE / TIME: 201908181908
UNIT TYPE AND RH: 6200
Unit Type and Rh: 5100
Unit Type and Rh: 5100
Unit Type and Rh: 5100
Unit Type and Rh: 5100
Unit Type and Rh: 5100
Unit Type and Rh: 5100
Unit Type and Rh: 5100
Unit Type and Rh: 5100
Unit Type and Rh: 600
Unit Type and Rh: 6200
Unit Type and Rh: 6200

## 2018-05-06 LAB — PREPARE FRESH FROZEN PLASMA
UNIT DIVISION: 0
UNIT DIVISION: 0
UNIT DIVISION: 0
UNIT DIVISION: 0
UNIT DIVISION: 0
UNIT DIVISION: 0
UNIT DIVISION: 0
Unit division: 0
Unit division: 0
Unit division: 0
Unit division: 0

## 2018-05-06 LAB — BASIC METABOLIC PANEL
ANION GAP: 6 (ref 5–15)
BUN: 36 mg/dL — ABNORMAL HIGH (ref 6–20)
CALCIUM: 8.7 mg/dL — AB (ref 8.9–10.3)
CO2: 37 mmol/L — ABNORMAL HIGH (ref 22–32)
Chloride: 98 mmol/L (ref 98–111)
Creatinine, Ser: 0.93 mg/dL (ref 0.44–1.00)
Glucose, Bld: 146 mg/dL — ABNORMAL HIGH (ref 70–99)
Potassium: 4 mmol/L (ref 3.5–5.1)
SODIUM: 141 mmol/L (ref 135–145)

## 2018-05-06 LAB — CBC
HCT: 36.8 % (ref 36.0–46.0)
HCT: 34.4 % — ABNORMAL LOW (ref 36.0–46.0)
HEMATOCRIT: 35 % — AB (ref 36.0–46.0)
HEMATOCRIT: 38.4 % (ref 36.0–46.0)
HEMOGLOBIN: 11.9 g/dL — AB (ref 12.0–15.0)
Hemoglobin: 10.9 g/dL — ABNORMAL LOW (ref 12.0–15.0)
Hemoglobin: 11.3 g/dL — ABNORMAL LOW (ref 12.0–15.0)
Hemoglobin: 12 g/dL (ref 12.0–15.0)
MCH: 28.2 pg (ref 26.0–34.0)
MCH: 28.2 pg (ref 26.0–34.0)
MCH: 28.2 pg (ref 26.0–34.0)
MCH: 28.5 pg (ref 26.0–34.0)
MCHC: 31.7 g/dL (ref 30.0–36.0)
MCHC: 31.3 g/dL (ref 30.0–36.0)
MCHC: 32.3 g/dL (ref 30.0–36.0)
MCHC: 32.3 g/dL (ref 30.0–36.0)
MCV: 87.3 fL (ref 78.0–100.0)
MCV: 88 fL (ref 78.0–100.0)
MCV: 89.1 fL (ref 78.0–100.0)
MCV: 90.4 fL (ref 78.0–100.0)
PLATELETS: 115 10*3/uL — AB (ref 150–400)
PLATELETS: 137 10*3/uL — AB (ref 150–400)
PLATELETS: 138 10*3/uL — AB (ref 150–400)
Platelets: 137 10*3/uL — ABNORMAL LOW (ref 150–400)
RBC: 3.86 MIL/uL — AB (ref 3.87–5.11)
RBC: 4.01 MIL/uL (ref 3.87–5.11)
RBC: 4.18 MIL/uL (ref 3.87–5.11)
RBC: 4.25 MIL/uL (ref 3.87–5.11)
RDW: 16.1 % — AB (ref 11.5–15.5)
RDW: 16.8 % — AB (ref 11.5–15.5)
RDW: 16.8 % — AB (ref 11.5–15.5)
RDW: 17 % — AB (ref 11.5–15.5)
WBC: 6.8 10*3/uL (ref 4.0–10.5)
WBC: 7.4 10*3/uL (ref 4.0–10.5)
WBC: 7.7 10*3/uL (ref 4.0–10.5)
WBC: 8.4 10*3/uL (ref 4.0–10.5)

## 2018-05-06 LAB — PREPARE PLATELET PHERESIS
UNIT DIVISION: 0
Unit division: 0

## 2018-05-06 LAB — MRSA PCR SCREENING: MRSA by PCR: NEGATIVE

## 2018-05-06 LAB — GLUCOSE, CAPILLARY
GLUCOSE-CAPILLARY: 162 mg/dL — AB (ref 70–99)
GLUCOSE-CAPILLARY: 153 mg/dL — AB (ref 70–99)
GLUCOSE-CAPILLARY: 143 mg/dL — AB (ref 70–99)
Glucose-Capillary: 146 mg/dL — ABNORMAL HIGH (ref 70–99)
Glucose-Capillary: 152 mg/dL — ABNORMAL HIGH (ref 70–99)
Glucose-Capillary: 153 mg/dL — ABNORMAL HIGH (ref 70–99)
Glucose-Capillary: 203 mg/dL — ABNORMAL HIGH (ref 70–99)

## 2018-05-06 LAB — HIV ANTIBODY (ROUTINE TESTING W REFLEX): HIV SCREEN 4TH GENERATION: NONREACTIVE

## 2018-05-06 LAB — BLOOD PRODUCT ORDER (VERBAL) VERIFICATION

## 2018-05-06 LAB — PROCALCITONIN: Procalcitonin: 0.36 ng/mL

## 2018-05-06 LAB — LACTIC ACID, PLASMA: LACTIC ACID, VENOUS: 2.4 mmol/L — AB (ref 0.5–1.9)

## 2018-05-06 LAB — PREPARE RBC (CROSSMATCH)

## 2018-05-06 SURGERY — EGD (ESOPHAGOGASTRODUODENOSCOPY)
Anesthesia: Moderate Sedation

## 2018-05-06 MED ORDER — ONDANSETRON HCL 4 MG/2ML IJ SOLN
4.0000 mg | Freq: Four times a day (QID) | INTRAMUSCULAR | Status: DC | PRN
  Administered 2018-05-06 – 2018-05-07 (×3): 4 mg via INTRAVENOUS
  Filled 2018-05-06 (×3): qty 2

## 2018-05-06 MED ORDER — MIDAZOLAM HCL 5 MG/ML IJ SOLN
INTRAMUSCULAR | Status: AC
  Filled 2018-05-06: qty 3

## 2018-05-06 MED ORDER — SODIUM CHLORIDE 0.9 % IV SOLN
INTRAVENOUS | Status: DC
  Administered 2018-05-06: 07:00:00 via INTRAVENOUS

## 2018-05-06 MED ORDER — MIDAZOLAM HCL 10 MG/2ML IJ SOLN
INTRAMUSCULAR | Status: DC | PRN
  Administered 2018-05-06: 1 mg via INTRAVENOUS
  Administered 2018-05-06 (×2): 2 mg via INTRAVENOUS
  Administered 2018-05-06 (×2): 1 mg via INTRAVENOUS

## 2018-05-06 MED ORDER — BUTAMBEN-TETRACAINE-BENZOCAINE 2-2-14 % EX AERO
INHALATION_SPRAY | CUTANEOUS | Status: DC | PRN
  Administered 2018-05-06: 2 via TOPICAL

## 2018-05-06 MED ORDER — DIPHENHYDRAMINE HCL 50 MG/ML IJ SOLN
INTRAMUSCULAR | Status: AC
  Filled 2018-05-06: qty 1

## 2018-05-06 MED ORDER — DIPHENHYDRAMINE HCL 50 MG/ML IJ SOLN
INTRAMUSCULAR | Status: DC | PRN
  Administered 2018-05-06 (×2): 25 mg via INTRAVENOUS

## 2018-05-06 MED ORDER — FENTANYL CITRATE (PF) 100 MCG/2ML IJ SOLN
INTRAMUSCULAR | Status: AC
  Filled 2018-05-06: qty 4

## 2018-05-06 MED ORDER — FENTANYL CITRATE (PF) 100 MCG/2ML IJ SOLN
INTRAMUSCULAR | Status: DC | PRN
  Administered 2018-05-06 (×6): 25 ug via INTRAVENOUS

## 2018-05-06 MED ORDER — MIDAZOLAM HCL 2 MG/2ML IJ SOLN
0.5000 mg | Freq: Three times a day (TID) | INTRAMUSCULAR | Status: DC
Start: 2018-05-06 — End: 2018-05-07
  Administered 2018-05-06 – 2018-05-07 (×2): 0.5 mg via INTRAVENOUS
  Filled 2018-05-06 (×3): qty 2

## 2018-05-06 NOTE — Progress Notes (Signed)
Trach care done no complications noted. Pt tolerated it well

## 2018-05-06 NOTE — Progress Notes (Addendum)
HISTORY & PHYSICAL  Patient Name: Kim Jenkins MRN: [id number] DOB: [birth year]    ADMISSION DATE:  05/05/2018 DATE OF SERVICE:  05/05/2018  CHIEF COMPLAINT:  Melena   HISTORY OF PRESENT ILLNESS  This 52 y.o. morbidly obese Caucasian female presented to the [facility] Emergency Department from [place] (where she has been getting Lovenox for DVT prophylaxis) with complaints of melena and coffee-ground emesis, which reportedly started last night (about 24 hours ago). On presentation, the patient was initially normotensive but subsequently experienced a precipitous drop in blood pressure. She was started on massive transfusion protocol and has received 9 units PRBC and 3 units platelets. At this time, she has SBP in the 110s and is NOT requiring vasopressors. She has a tracheostomy and is night-time ventilator dependent. Nonetheless, she is arousable and interactive at this time. Answers yes/no appropriately and follows commands.  She is a resident at [facility], where she has been receiving care since 2015. She is night-time ventilator dependent due to morbid obesity and severe obstructive sleep apnea. She is reported to have a history of GERD and heart failure, in addition to those conditions indicated below in her past medical history.  SUBJECTIVE/INTERVAL: No acute events overnight. No complaints this morning.   VITAL SIGNS: BP 107/65   Pulse 62   Temp 99.1 F (37.3 C) (Oral)   Resp 20   Wt (!) 212.3 kg   SpO2 98%   BMI 73.30 kg/m   VENTILATOR SETTINGS: Vent Mode: SIMV;PRVC FiO2 (%):  [45 %-60 %] 60 % Set Rate:  [20 bmp] 20 bmp Vt Set:  [500 mL] 500 mL PEEP:  [5 cmH20] 5 cmH20 Pressure Support:  [12 cmH20] 12 cmH20 Plateau Pressure:  [18 cmH20-24 cmH20] 18 cmH20  INTAKE / OUTPUT: I/O last 3 completed shifts: In: 2559.5 [I.V.:201.7; IV Piggyback:2357.8] Out: 676 [Urine:675]  Physical Exam General:  morbidly obese female in NAD Neuro:   Alert, oriented, non-focal HEENT:  /AT, No JVD noted, PERRL Cardiovascular:  Distant, RRR, no MRG Lungs:  Clear Abdomen:  Soft, non-distended, non-tender.  Musculoskeletal:  No acute deformity Skin:  Intact, MMM  LABS:  BASIC METABOLIC PROFILE Recent Labs  Lab 05/05/18 1710 05/05/18 1900 05/05/18 1915 05/05/18 2132  NA 135 134* 137  --   K 4.4 4.2 4.3  --   CL 87* 91* 89*  --   CO2 38* 33*  --   --   BUN 63* 58* 60*  --   CREATININE 1.17* 1.10* 1.00  --   GLUCOSE 369* 309* 311*  --   CALCIUM 8.5* 7.3*  --   --   MG  --   --   --  1.9  PHOS  --   --   --  3.1    Liver Enzymes Recent Labs  Lab 05/05/18 1710 05/05/18 1900  AST 20 17  ALT 17 13  ALKPHOS 54 42  BILITOT 0.6 0.5  ALBUMIN 2.7* 2.2*    CBC Recent Labs  Lab 05/05/18 1900 05/05/18 1915 05/05/18 2330 05/06/18 0428  WBC 9.5  --  8.4 7.4  HGB 11.1* 11.6* 11.3* 11.9*  HCT 35.0* 34.0* 35.0* 36.8  PLT 135*  --  138* 137*    COAGULATION STUDIES Recent Labs  Lab 05/05/18 1710 05/05/18 2132  APTT  --  26  INR 1.01 1.04    SEPSIS MARKERS Recent Labs  Lab 05/05/18 1726 05/05/18 2132 05/05/18 2143 05/06/18 0044 05/06/18 0428  LATICACIDVEN 3.26* 2.3*  --  2.4*  --   PROCALCITON  --   --  0.36  --  0.36    ABG Recent Labs  Lab 05/05/18 1839  PHART 7.345*  PCO2ART 65.4*  PO2ART 91.0    Cardiac Enzymes No results for input(s): TROPONINI, PROBNP in the last 168 hours.  Imaging Ct Abdomen Pelvis W Contrast  Result Date: 05/05/2018 CLINICAL DATA:  52 year old female with suspected GI bleed. Became hypotensive in the ED. EXAM: CT ABDOMEN AND PELVIS WITH CONTRAST TECHNIQUE: Multidetector CT imaging of the abdomen and pelvis was performed using the standard protocol following bolus administration of intravenous contrast. CONTRAST:  146mL ISOVUE-300 IOPAMIDOL (ISOVUE-300) INJECTION 61% COMPARISON:  Portable radiographs 02/18/2014 and earlier. FINDINGS: Lower chest: Cardiac size at the upper  limits of normal. No pericardial effusion. Confluent opacity in the right costophrenic angle most resembles atelectasis. Lesser left costophrenic angle atelectasis. No pleural effusion. The visible lingula and middle lobe are clear. Hepatobiliary: Negative liver and gallbladder. No biliary ductal enlargement. Pancreas: Negative. Spleen: Negative. Adrenals/Urinary Tract: Normal adrenal glands. Bilateral renal enhancement is symmetric and within normal limits. No perinephric stranding, hydronephrosis, or hydroureter. There is no renal contrast excretion on the delayed images. Stomach/Bowel: Decompressed rectum and distal sigmoid colon. Redundant sigmoid, mildly gas distended in the left lower quadrant gas-filled but nondilated left colon and transverse colon. Redundant right colon. Negative cecum and terminal ileum. Rectus muscle diastasis and ventral abdominal hernia repair with mesh. No dilated small bowel. The stomach is moderately distended with mostly gas and to a lesser extent fluid. Negative duodenum aside from a small 2-3 millimeter duodenal diverticulum in the 2nd portion. No abdominal free air, free fluid. Vascular/Lymphatic: Calcified aortic atherosclerosis. Suboptimal intravascular contrast bolus, but the major arterial structures in the abdomen and pelvis appear to be patent. The portal venous system appears patent. No lymphadenopathy. Reproductive: Diminutive, negative. Other: No retroperitoneal hemorrhage.  No pelvic free fluid. Musculoskeletal: Chronic right lateral 8th rib fracture. Spinal disc and endplate degeneration. No acute osseous abnormality identified. IMPRESSION: 1. Absent renal contrast excretion on the delayed images might indicate acute intrinsic renal disease such as ATN. 2. No other acute or inflammatory process in the abdomen or pelvis. Negative for retroperitoneal hemorrhage. 3. Lung base atelectasis. 4. Mild calcified aortic atherosclerosis. Electronically Signed   By: Genevie Ann M.D.    On: 05/05/2018 21:17   Dg Chest Port 1 View  Result Date: 05/05/2018 CLINICAL DATA:  Central catheter placement EXAM: PORTABLE CHEST 1 VIEW COMPARISON:  September 29, 2017 FINDINGS: Endotracheal tube tip is 5.5 cm above the carina. Central catheter extends into the superior vena cava where it bends upon itself. The tip is at the junction of the left innominate vein and superior vena cava. No pneumothorax. There is mild patchy infiltrate in the medial right base. Lungs elsewhere are clear. Heart is borderline enlarged with pulmonary vascularity normal. No adenopathy. No bone lesions. IMPRESSION: Tube and catheter positions as described. Note that the central catheter bends upon itself with the tip directed back toward the left innominate vein. No pneumothorax. Patchy infiltrate medial right base. Lungs elsewhere clear. Stable cardiac prominence. Electronically Signed   By: Lowella Grip III M.D.   On: 05/05/2018 20:34    SIGNIFICANT EVENTS: 8/18: admitted with coffee-ground emesis and melena. Massive transfusion in the ER (9 units PRBC, 3 units platelets).  LINES/TUBES: 8/18: L IJ rapid infuser (large bore CVC) inserted  ASSESSMENT / PLAN:  Hemorrhagic shock secondary to ABLA, UGI bleed: Transfused 9 units PRBC  and 3 units FFP in ED. Hemoglobin improved from 6 to 11.  On Lovenox at SNF. CT negative for RP bleed.  - GI following, planning for endoscopy today.  - Continue PPI infusion, transition to BID 8/20 - H. Pylori serology pending - Hold Lovenox until at least 8/23 - Follow CBC.   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 Tracheostomy status with mandatory nocturnal ventilation.  - [facility] night time vent settings: SIMV 20/500/45/5 + PSV 12 - have asked RT to take off vent.  - Continue Brovana/Pulmicort - VAP bundle - SLP for swallow/PMSV   Type 2 diabetes mellitus -Lantus 10 units subcutaneously -SSI coverage -Awaiting BMP   Bipolar 1  disorder Chronic pain syndrome - Home meds include oxycodone, Cymbalta, Fiorcet, Flexeril, Klonopin, Depakote ER, Desyrel and Wellbutrin. Will hold these until situation stabilizes further. -DC precedex infusion  -Will schedule low dose benzodiazepine to avoid withdrawal.  Can change to PO once taking.    AKI - Follow BMP   LEUKOCYTOSIS, likely reactive. Now normalized. No fevers.  -Culture (blood, urine, trach aspirate) pending -Discontinue antibiotics  FAMILY  - Updates: no family at bedside  - Inter-disciplinary family meet or Palliative Care meeting due by:  05/12/2018   Georgann Housekeeper, AGACNP-BC [place] [facility] Pager [phone] or [phone]  05/06/2018 10:57 AM  Attending Note:  52 year old female vent SNF resident that presents to [facility] with melena.  Patient had an EGD done this morning and required high level of sedation so remains on full vent support post procedure.  On exam, she is slowly starting to wake up.  I reviewed CXR myself, low lung volumes and trach is in good position.  Discussed with GI.  Will continue with protonix drip.  Hold further transfusion.  CBCs as ordered.  Attempt to get patient off the vent today for during the day.  PMV consult ordered.  PCCM will continue to follow.  The patient is critically ill with multiple organ systems failure and requires high complexity decision making for assessment and support, frequent evaluation and titration of therapies, application of advanced monitoring technologies and extensive interpretation of multiple databases.   Critical Care Time devoted to patient care services described in this note is  32  Minutes. This time reflects time of care of this signee Dr Jennet Maduro. This critical care time does not reflect procedure time, or teaching time or supervisory time of PA/NP/Med student/Med Resident etc but could involve care discussion time.  Rush Farmer, M.D. [facility]monary/Critical Care  Medicine. Pager: [phone]. After hours pager: [phone].

## 2018-05-06 NOTE — Progress Notes (Addendum)
Daily Rounding Note  05/06/2018, 8:09 AM  LOS: 1 day   SUBJECTIVE:   Chief complaint:  Melena, CGE.  None reported since [facility].   Unable to vocalize due to trach.    OBJECTIVE:         Vital signs in last 24 hours:    Temp:  [97.3 F (36.3 C)-98.5 F (36.9 C)] 98.5 F (36.9 C) (08/19 0400) Pulse Rate:  [54-114] 62 (08/19 0600) Resp:  [15-23] 21 (08/19 0421) BP: (55-137)/(23-93) 100/61 (08/19 0600) SpO2:  [90 %-100 %] 98 % (08/19 0600) FiO2 (%):  [45 %-60 %] 60 % (08/19 0421) Weight:  [212.3 kg-214.6 kg] 212.3 kg (08/19 0431) Last BM Date: 05/05/18(per pt at 1500) Filed Weights   05/05/18 1749 05/06/18 0431  Weight: (!) 214.6 kg (!) 212.3 kg   General: morbidly obese.  Alert.     Heart: RRR Chest: no resp distress.  Trach, vent in place.   Abdomen: obese, soft, scar of umbilical, ventral hernia present.  + long ventral hernia.  NT.  BS active  Extremities: non-pitting edema vs fat in feet.   Neuro/Psych:  Oriented to situation.  Appropriate.  Communicating via pen/paper as trach prevents speech.  Moves all 4 limbs.    Intake/Output from previous day: 08/18 0701 - 08/19 0700 In: 2559.5 [I.V.:201.7; IV Piggyback:2357.8] Out: 675 [Urine:675]  Intake/Output this shift: No intake/output data recorded.  Lab Results: Recent Labs    05/05/18 1900 05/05/18 1915 05/05/18 2330 05/06/18 0428  WBC 9.5  --  8.4 7.4  HGB 11.1* 11.6* 11.3* 11.9*  HCT 35.0* 34.0* 35.0* 36.8  PLT 135*  --  138* 137*   BMET Recent Labs    05/05/18 1710 05/05/18 1900 05/05/18 1915  NA 135 134* 137  K 4.4 4.2 4.3  CL 87* 91* 89*  CO2 38* 33*  --   GLUCOSE 369* 309* 311*  BUN 63* 58* 60*  CREATININE 1.17* 1.10* 1.00  CALCIUM 8.5* 7.3*  --    LFT Recent Labs    05/05/18 1710 05/05/18 1900  PROT 7.2 5.4*  ALBUMIN 2.7* 2.2*  AST 20 17  ALT 17 13  ALKPHOS 54 42  BILITOT 0.6 0.5  BILIDIR  <0.1  --   IBILI NOT CALCULATED  --    PT/INR Recent Labs    05/05/18 1710 05/05/18 2132  LABPROT 13.2 13.5  INR 1.01 1.04   Hepatitis Panel No results for input(s): HEPBSAG, HCVAB, HEPAIGM, HEPBIGM in the last 72 hours.  Studies/Results: Ct Abdomen Pelvis W Contrast  Result Date: 05/05/2018 CLINICAL DATA:  52 year old female with suspected GI bleed. Became hypotensive in the ED. EXAM: CT ABDOMEN AND PELVIS WITH CONTRAST TECHNIQUE: Multidetector CT imaging of the abdomen and pelvis was performed using the standard protocol following bolus administration of intravenous contrast. CONTRAST:  169mL ISOVUE-300 IOPAMIDOL (ISOVUE-300) INJECTION 61% COMPARISON:  Portable radiographs 02/18/2014 and earlier. FINDINGS: Lower chest: Cardiac size at the upper limits of normal. No pericardial effusion. Confluent opacity in the right costophrenic angle most resembles atelectasis. Lesser left costophrenic angle atelectasis. No pleural effusion. The visible lingula and middle lobe are clear. Hepatobiliary: Negative liver and gallbladder. No biliary ductal enlargement. Pancreas: Negative. Spleen: Negative. Adrenals/Urinary Tract: Normal adrenal glands. Bilateral renal enhancement is symmetric and within normal limits. No perinephric stranding, hydronephrosis, or hydroureter. There is no renal contrast excretion on the delayed images. Stomach/Bowel: Decompressed rectum and distal sigmoid colon.  Redundant sigmoid, mildly gas distended in the left lower quadrant gas-filled but nondilated left colon and transverse colon. Redundant right colon. Negative cecum and terminal ileum. Rectus muscle diastasis and ventral abdominal hernia repair with mesh. No dilated small bowel. The stomach is moderately distended with mostly gas and to a lesser extent fluid. Negative duodenum aside from a small 2-3 millimeter duodenal diverticulum in the 2nd portion. No abdominal free air, free fluid. Vascular/Lymphatic: Calcified aortic  atherosclerosis. Suboptimal intravascular contrast bolus, but the major arterial structures in the abdomen and pelvis appear to be patent. The portal venous system appears patent. No lymphadenopathy. Reproductive: Diminutive, negative. Other: No retroperitoneal hemorrhage.  No pelvic free fluid. Musculoskeletal: Chronic right lateral 8th rib fracture. Spinal disc and endplate degeneration. No acute osseous abnormality identified. IMPRESSION: 1. Absent renal contrast excretion on the delayed images might indicate acute intrinsic renal disease such as ATN. 2. No other acute or inflammatory process in the abdomen or pelvis. Negative for retroperitoneal hemorrhage. 3. Lung base atelectasis. 4. Mild calcified aortic atherosclerosis. Electronically Signed   By: Genevie Ann M.D.   On: 05/05/2018 21:17   Dg Chest Port 1 View  Result Date: 05/05/2018 CLINICAL DATA:  Central catheter placement EXAM: PORTABLE CHEST 1 VIEW COMPARISON:  September 29, 2017 FINDINGS: Endotracheal tube tip is 5.5 cm above the carina. Central catheter extends into the superior vena cava where it bends upon itself. The tip is at the junction of the left innominate vein and superior vena cava. No pneumothorax. There is mild patchy infiltrate in the medial right base. Lungs elsewhere are clear. Heart is borderline enlarged with pulmonary vascularity normal. No adenopathy. No bone lesions. IMPRESSION: Tube and catheter positions as described. Note that the central catheter bends upon itself with the tip directed back toward the left innominate vein. No pneumothorax. Patchy infiltrate medial right base. Lungs elsewhere clear. Stable cardiac prominence. Electronically Signed   By: Lowella Grip III M.D.   On: 05/05/2018 20:34   Scheduled Meds: . insulin aspart  0-15 Units Subcutaneous Q4H  . [START ON 05/09/2018] pantoprazole  40 mg Intravenous Q12H   Continuous Infusions: . sodium chloride    . sodium chloride 20 mL/hr at 05/06/18 0644  .  levofloxacin (LEVAQUIN) IV Stopped (05/05/18 2340)  . pantoprozole (PROTONIX) infusion 8 mg/hr (05/06/18 0600)  . vancomycin     PRN Meds:.sodium chloride, ondansetron (ZOFRAN) IV   ASSESMENT:   *  Acute on subacute upper GIB in setting of long term Lovenox (on hold, last dose 8/17).CGE, melena.  Pt says she had EGD in 2008 and had GIB from"hernia".  Previous colonoscopy at unclear date  No RP bleed on CT scan.   S/p 9 U PRBC, 3 U platelets at [facility], 2 units at [place].     *  Morbid obesity.  BMI 73 kg.  OSA.  OHS.  S/p trach, chronic vent pt.  .    *   Pt at [facility] since 2015.  Marland Kitchen     PLAN   *   EGD at [facility] today.      Kim Jenkins  05/06/2018, 8:09 AM Phone [phone]

## 2018-05-06 NOTE — Op Note (Signed)
[facility] Patient Name: Kim Jenkins Procedure Date : 05/06/2018 MRN: [id number] Attending MD: Justice Britain , MD Date of Birth: Jan 05, 1966 CSN: [id number] Age: 52 Admit Type: Inpatient Procedure:                Upper GI endoscopy Indications:              Acute post hemorrhagic anemia, Hematemesis Providers:                Justice Britain, MD, Kingsley Plan, RN,                            Alan Mulder, Technician Referring MD:             Gatha Mayer, MD, Azucena Freed PA, PA,                            Renee Pain, MD Medicines:                Diphenhydramine 50 mg IV, Midazolam 7 mg IV,                            Fentanyl 150 micrograms IV Complications:            No immediate complications. Estimated Blood Loss:      Procedure:                Pre-Anesthesia Assessment:                           - Prior to the procedure, a History and Physical                            was performed, and patient medications and                            allergies were reviewed. The patient's tolerance of                            previous anesthesia was also reviewed. The risks                            and benefits of the procedure and the sedation                            options and risks were discussed with the patient.                            All questions were answered, and informed consent                            was obtained. Prior Anticoagulants: The patient has                            taken Lovenox (enoxaparin), last dose was 1 day  prior to procedure. ASA Grade Assessment: III - A                            patient with severe systemic disease. After                            reviewing the risks and benefits, the patient was                            deemed in satisfactory condition to undergo the                            procedure.                           After obtaining informed consent, the endoscope was                             passed under direct vision. Throughout the                            procedure, the patient's blood pressure, pulse, and                            oxygen saturations were monitored continuously. The                            GIF-H190 ([id number]) Olympus Adult EGD was introduced                            through the mouth, and advanced to the second part                            of duodenum. The upper GI endoscopy was                            accomplished without difficulty. The patient                            tolerated the procedure. Scope In: Scope Out: Findings:      No gross lesions were noted in the entire esophagus.      The Z-line was irregular and was found 44 cm from the incisors.      A J-shaped deformity was found in the stomach.      Clotted blood and foodstuffs were found in the cardia, in the gastric       fundus and in the proximal gastric body - this could not be suctioned       completely.      Patchy mildly erythematous mucosa without bleeding was found in the       gastric antrum.      No gross lesions were noted in the visualized stomach.      One non-bleeding cratered duodenal ulcer with a flat pigmented spot       (Forrest Class IIc) was found in the duodenal bulb. The lesion  was 15 mm       in largest dimension.      The second portion of the duodenum was normal. Impression:               - No gross lesions in esophagus.                           - Z-line irregular, 44 cm from the incisors.                           - J-shaped gastric deformity.                           - Clotted blood & foodstuffs were found in the                            cardia, in the gastric fundus and in the proximal                            gastric body.                           - Erythematous mucosa in the antrum.                           - No other gross lesions in the portion of the                            stomach that was visualized.                            - One non-bleeding duodenal ulcer with a flat                            pigmented spot (Forrest Class IIc).                           - Normal second portion of the duodenum. Moderate Sedation:      Moderate (conscious) sedation was administered by the endoscopy nurse       and supervised by the endoscopist. The following parameters were       monitored: oxygen saturation, heart rate, blood pressure, and response       to care. Total physician intraservice time was 20 minutes. Recommendation:           - The patient will be observed post-procedure,                            until all discharge criteria are met.                           - Observe patient's clinical course.                           - Trend Hgb/Hct as per ICU service.                           -  Send H. pylori serology and if positive treat                            with triple vs quadruple therapy.                           - Minimize NSAID as able.                           - Would hold Lovenox if possible for at least 72-96                            hours if possible. If needs to be placed back on                            anticoagulation, I would wait for at least 24 hours                            and use a heparin gtt without bolus.                           - Continue PPI Infusion for 24 hours and if she                            does well then transition to BID for 48 more hours.                           - If there is evidence of significant recurrent GI                            bleeding, in setting of hemodynamic instability                            with hematemesis or signficant melena with                            downtrending hemoglobin, would consider a repeat                            EGD or possibly proceed with an IR intervention of                            the GDA due to positioning, but GI should be called                            to discuss.                            - The findings and recommendations were discussed                            with the patient.                           -  The findings and recommendations were discussed                            with the referring physician. Procedure Code(s):        --- Professional ---                           [phone], Esophagogastroduodenoscopy, flexible,                            transoral; diagnostic, including collection of                            specimen(s) by brushing or washing, when performed                            (separate procedure)                           G0500, Moderate sedation services provided by the                            same physician or other qualified health care                            professional performing a gastrointestinal                            endoscopic service that sedation supports,                            requiring the presence of an independent trained                            observer to assist in the monitoring of the                            patient's level of consciousness and physiological                            status; initial 15 minutes of intra-service time;                            patient age 54 years or older (additional time may                            be reported with [phone], as appropriate) Diagnosis Code(s):        --- Professional ---                           K22.8, Other specified diseases of esophagus                           K31.89, Other diseases of stomach and duodenum  K92.2, Gastrointestinal hemorrhage, unspecified                           K26.9, Duodenal ulcer, unspecified as acute or                            chronic, without hemorrhage or perforation                           D62, Acute posthemorrhagic anemia                           K92.0, Hematemesis CPT copyright 2017 American Medical Association. All rights reserved. The codes documented in this report are preliminary and  upon coder review may  be revised to meet current compliance requirements. Justice Britain, MD 05/06/2018 9:46:52 AM Number of Addenda: 0

## 2018-05-06 NOTE — Consult Note (Addendum)
[facility] Nurse wound consult note Reason for Consult: Consult requested for buttocks.  Pt has trach and high BMI and is unable to discuss wound etiology. Left buttock with purple-red raised deep tissue injury; appearance is consistent with pressure and shear.  5X1cm intact skin, no open wound, drainage, or fluctuance. 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 Pressure Injury POA: Yes Dressing procedure/placement/frequency: No family present to discuss plan of care.  Foam dressing to protect and promote healing to right buttock wound. Barrier cream to protect left buttock. Pt is on a low airloss bed to reduce pressure. Please re-consult if further assistance is needed.  Thank-you,  Julien Girt MSN, [place], [place], [place], [place]

## 2018-05-06 NOTE — Plan of Care (Signed)

## 2018-05-06 NOTE — Interval H&P Note (Signed)
History and Physical Interval Note:  05/06/2018 9:03 AM  Kim Jenkins  has presented today for surgery, with the diagnosis of Upper GI bleed  The various methods of treatment have been discussed with the patient and family. After consideration of risks, benefits and other options for treatment, the patient has consented to  Procedure(s): ESOPHAGOGASTRODUODENOSCOPY (EGD) (N/A) as a surgical intervention .  The patient's history has been reviewed, patient examined, no change in status, stable for surgery.  I have reviewed the patient's chart and labs.  Questions were answered to the patient's satisfaction.    The risks and benefits of endoscopic evaluation were discussed with the patient; these include but are not limited to the risk of perforation, infection, bleeding, missed lesions, lack of diagnosis, severe illness requiring hospitalization, as well as anesthesia and sedation related illnesses.  The patient is agreeable to proceed.  We will attempt with Moderate sedation as she has a tracheostomy in place.  If she is uncomfortable then we will ask ICU to help with Propafol monitored anesthesia care due to her history and need for chronic ventilation.   [employer]

## 2018-05-06 NOTE — Progress Notes (Signed)
CRITICAL VALUE ALERT  Critical Value:  Lactic 2.4  Date & Time Notied:  05/06/18 0130  Provider Notified: Warren Lacy  Orders Received/Actions taken: see orders

## 2018-05-06 NOTE — Evaluation (Signed)
[facility] Speaking Valve - Evaluation Patient Details  Name: Kim Jenkins MRN: [id number] Date of Birth: [birth year]  Today's Date: 05/06/2018 Time: 1457-1510 SLP Time Calculation (min) (ACUTE ONLY): 13 min  Past Medical History:  Past Medical History:  Diagnosis Date  . Bipolar 1 disorder ([place])   . Chronic pain   . Depression   . Diabetes mellitus ([place])   . Hypertension   . Kidney stones   . Morbid obesity ([place])   . Obesity hypoventilation syndrome ([place])   . Obstructive sleep apnea   . Panniculitis    Past Surgical History:  Past Surgical History:  Procedure Laterality Date  . RIGHT OOPHORECTOMY    . TRACHEOSTOMY    . VENTRAL HERNIA REPAIR     HPI:  Pt is a 52 y.o. female admitted from [facility] with hemorrhagic shock secondary to ABLA, UGI bleed, now s/p transfusion and EGD.  She has a tracheostomy and is night-time ventilator dependent. PMH: GERD, heart failure, OSA, obesity hypoventilation syndrome, HTN, DM, depression, chronic pain, bipolar 1 disorder   Assessment / Plan / Recommendation Clinical Impression  Pt wore her PMV for 20 minutes with VS stable and no evidence of back pressure. She donned her valve with Min A from SLP; doffed it with Mod I. She verbalized when she needs to take it off but also shares that she typically wears it throughout waking hours well. Her voice is mildly rough but strong. Recommend that she wear her PMV while off the vent as tolerated with intermittent supervision. No further acute SLP needs identified for PMV use. SLP Visit Diagnosis: Aphonia (R49.1)    SLP Assessment  Patient does not need any further Speech Lanaguage Pathology Services    Follow Up Recommendations  None    Frequency and Duration         PMSV Trial PMSV was placed for: 20 min Able to redirect subglottic air through upper airway: Yes Able to Attain Phonation: Yes Voice Quality: Normal Able to Expectorate Secretions: No attempts Level of Secretion Expectoration with  PMSV: Not observed Breath Support for Phonation: Adequate Intelligibility: Intelligible Respirations During Trial: (WFL) SpO2 During Trial: 97 % Pulse During Trial: 86 Behavior: Alert;Controlled;Cooperative   Tracheostomy Tube       Vent Dependency  FiO2 (%): 60 %    Cuff Deflation Trial  GO Tolerated Cuff Deflation: Yes Length of Time for Cuff Deflation Trial: 20 min Behavior: Alert;Cooperative        [name] 05/06/2018, 3:40 PM   [name], M.A. CCC-SLP [phone]

## 2018-05-06 NOTE — Progress Notes (Signed)
Pt is on the Vent QHS at this time. Tolerating it well

## 2018-05-06 NOTE — Evaluation (Signed)
Clinical/Bedside Swallow Evaluation Patient Details  Name: Kim Jenkins MRN: [id number] Date of Birth: [birth year]  Today's Date: 05/06/2018 Time: SLP Start Time (ACUTE ONLY): 24 SLP Stop Time (ACUTE ONLY): 1523 SLP Time Calculation (min) (ACUTE ONLY): 13 min  Past Medical History:  Past Medical History:  Diagnosis Date  . Bipolar 1 disorder ([place])   . Chronic pain   . Depression   . Diabetes mellitus ([place])   . Hypertension   . Kidney stones   . Morbid obesity ([place])   . Obesity hypoventilation syndrome ([place])   . Obstructive sleep apnea   . Panniculitis    Past Surgical History:  Past Surgical History:  Procedure Laterality Date  . RIGHT OOPHORECTOMY    . TRACHEOSTOMY    . VENTRAL HERNIA REPAIR     HPI:  Pt is a 52 y.o. female admitted from [facility] with hemorrhagic shock secondary to ABLA, UGI bleed, now s/p transfusion and EGD.  She has a tracheostomy and is night-time ventilator dependent. PMH: GERD, heart failure, OSA, obesity hypoventilation syndrome, HTN, DM, depression, chronic pain, bipolar 1 disorder   Assessment / Plan / Recommendation Clinical Impression  Pt had one immediate cough, otherwise not showing any overt signs of aspiration or dysphagia. She reports that she consumes regular diet textures and thin liquids at [facility], and she is not followed by SLP for her swallowing. Recommend starting PO diet (cleared by MD for clear liquids at this time) but with SLP f/u to assess for tolerance. MD may advance diet as medically able to do so; SLP will follow briefly as progression occurs.  SLP Visit Diagnosis: Dysphagia, unspecified (R13.10)    Aspiration Risk  Mild aspiration risk    Diet Recommendation Thin liquid   Liquid Administration via: Cup;Straw Medication Administration: Whole meds with liquid Supervision: Patient able to self feed;Intermittent supervision to cue for compensatory strategies Compensations: Slow rate;Small sips/bites Postural Changes: Seated  upright at 90 degrees    Other  Recommendations Oral Care Recommendations: Oral care BID Other Recommendations: Place PMSV during PO intake   Follow up Recommendations Skilled Nursing facility(f/u upon return to [facility])      Frequency and Duration min 2x/week  2 weeks       Prognosis Prognosis for Safe Diet Advancement: Good      Swallow Study   General HPI: Pt is a 52 y.o. female admitted from [facility] with hemorrhagic shock secondary to ABLA, UGI bleed, now s/p transfusion and EGD.  She has a tracheostomy and is night-time ventilator dependent. PMH: GERD, heart failure, OSA, obesity hypoventilation syndrome, HTN, DM, depression, chronic pain, bipolar 1 disorder Type of Study: Bedside Swallow Evaluation Previous Swallow Assessment: none in chart Diet Prior to this Study: NPO Temperature Spikes Noted: No Respiratory Status: Trach;Trach Collar Trach Size and Type: Cuff;#6;Deflated;With PMSV in place History of Recent Intubation: No Behavior/Cognition: Alert;Cooperative;Pleasant mood Oral Cavity Assessment: Within Functional Limits Oral Cavity - Dentition: Missing dentition Vision: Functional for self-feeding Self-Feeding Abilities: Able to feed self;Needs assist Patient Positioning: Partially reclined(as upright as can be in bed - used reverse trendelenburg) Baseline Vocal Quality: Normal Volitional Cough: Strong    Oral/Motor/Sensory Function Overall Oral Motor/Sensory Function: Within functional limits   Ice Chips Ice chips: Within functional limits Presentation: Spoon   Thin Liquid Thin Liquid: Impaired Presentation: Self Fed;Straw Pharyngeal  Phase Impairments: Cough - Immediate(x1)    Nectar Thick Nectar Thick Liquid: Not tested   Honey Thick Honey Thick Liquid: Not tested   Puree Puree: Not tested  Solid     Solid: Not tested      [name] 05/06/2018,3:48 PM  [name], M.A. CCC-SLP [phone]

## 2018-05-07 ENCOUNTER — Encounter (HOSPITAL_COMMUNITY): Payer: Self-pay | Admitting: Gastroenterology

## 2018-05-07 DIAGNOSIS — Z930 Tracheostomy status: Secondary | ICD-10-CM

## 2018-05-07 DIAGNOSIS — K264 Chronic or unspecified duodenal ulcer with hemorrhage: Principal | ICD-10-CM

## 2018-05-07 DIAGNOSIS — R0902 Hypoxemia: Secondary | ICD-10-CM

## 2018-05-07 LAB — GLUCOSE, CAPILLARY
GLUCOSE-CAPILLARY: 149 mg/dL — AB (ref 70–99)
GLUCOSE-CAPILLARY: 240 mg/dL — AB (ref 70–99)
Glucose-Capillary: 132 mg/dL — ABNORMAL HIGH (ref 70–99)
Glucose-Capillary: 190 mg/dL — ABNORMAL HIGH (ref 70–99)
Glucose-Capillary: 213 mg/dL — ABNORMAL HIGH (ref 70–99)

## 2018-05-07 LAB — BASIC METABOLIC PANEL
ANION GAP: 4 — AB (ref 5–15)
BUN: 24 mg/dL — AB (ref 6–20)
CALCIUM: 8.7 mg/dL — AB (ref 8.9–10.3)
CO2: 37 mmol/L — ABNORMAL HIGH (ref 22–32)
Chloride: 99 mmol/L (ref 98–111)
Creatinine, Ser: 0.91 mg/dL (ref 0.44–1.00)
GFR calc Af Amer: >60 mL/min (ref >60)
GLUCOSE: 148 mg/dL — AB (ref 70–99)
Potassium: 3.9 mmol/L (ref 3.5–5.1)
SODIUM: 140 mmol/L (ref 135–145)

## 2018-05-07 LAB — CBC
HCT: 37.7 % (ref 36.0–46.0)
HEMATOCRIT: 43.2 % (ref 36.0–46.0)
HEMOGLOBIN: 13.1 g/dL (ref 12.0–15.0)
Hemoglobin: 11.6 g/dL — ABNORMAL LOW (ref 12.0–15.0)
MCH: 28.6 pg (ref 26.0–34.0)
MCH: 28.7 pg (ref 26.0–34.0)
MCHC: 30.8 g/dL (ref 30.0–36.0)
MCHC: 30.3 g/dL (ref 30.0–36.0)
MCV: 92.9 fL (ref 78.0–100.0)
MCV: 94.5 fL (ref 78.0–100.0)
PLATELETS: 128 10*3/uL — AB (ref 150–400)
Platelets: 128 10*3/uL — ABNORMAL LOW (ref 150–400)
RBC: 4.06 MIL/uL (ref 3.87–5.11)
RBC: 4.57 MIL/uL (ref 3.87–5.11)
RDW: 17.2 % — AB (ref 11.5–15.5)
RDW: 17.2 % — ABNORMAL HIGH (ref 11.5–15.5)
WBC: 5.8 10*3/uL (ref 4.0–10.5)
WBC: 6.5 10*3/uL (ref 4.0–10.5)

## 2018-05-07 LAB — MAGNESIUM: MAGNESIUM: 2.3 mg/dL (ref 1.7–2.4)

## 2018-05-07 LAB — H. PYLORI ANTIBODY, IGG

## 2018-05-07 LAB — PROCALCITONIN: PROCALCITONIN: 0.16 ng/mL

## 2018-05-07 LAB — PHOSPHORUS: PHOSPHORUS: 2.6 mg/dL (ref 2.5–4.6)

## 2018-05-07 MED ORDER — SODIUM CHLORIDE 0.9% FLUSH
10.0000 mL | Freq: Two times a day (BID) | INTRAVENOUS | Status: DC
  Administered 2018-05-07 – 2018-05-09 (×3): 10 mL

## 2018-05-07 MED ORDER — TRAZODONE HCL 150 MG PO TABS
150.0000 mg | ORAL_TABLET | Freq: Every day | ORAL | Status: DC
  Administered 2018-05-07 – 2018-05-08 (×2): 150 mg via ORAL
  Filled 2018-05-07 (×3): qty 1

## 2018-05-07 MED ORDER — INSULIN GLARGINE 100 UNIT/ML ~~LOC~~ SOLN
20.0000 [IU] | Freq: Every day | SUBCUTANEOUS | Status: DC
  Administered 2018-05-07 – 2018-05-08 (×2): 20 [IU] via SUBCUTANEOUS
  Filled 2018-05-07 (×3): qty 0.2

## 2018-05-07 MED ORDER — SODIUM CHLORIDE 0.9% FLUSH: 10.0000 mL | INTRAVENOUS | Status: DC | PRN

## 2018-05-07 MED ORDER — OLOPATADINE HCL 0.1 % OP SOLN
1.0000 [drp] | Freq: Every day | OPHTHALMIC | Status: DC
  Administered 2018-05-07 – 2018-05-08 (×2): 1 [drp] via OPHTHALMIC
  Filled 2018-05-07: qty 5

## 2018-05-07 MED ORDER — ARIPIPRAZOLE 5 MG PO TABS
5.0000 mg | ORAL_TABLET | Freq: Every day | ORAL | Status: DC
  Administered 2018-05-07 – 2018-05-09 (×3): 5 mg via ORAL
  Filled 2018-05-07 (×3): qty 1

## 2018-05-07 MED ORDER — TORSEMIDE 20 MG PO TABS
20.0000 mg | ORAL_TABLET | Freq: Every day | ORAL | Status: DC
  Administered 2018-05-07 – 2018-05-09 (×3): 20 mg via ORAL
  Filled 2018-05-07 (×3): qty 1

## 2018-05-07 MED ORDER — VITAMIN D (ERGOCALCIFEROL) 1.25 MG (50000 UNIT) PO CAPS
50000.0000 [IU] | ORAL_CAPSULE | ORAL | Status: DC
  Administered 2018-05-09: 50000 [IU] via ORAL
  Filled 2018-05-07: qty 1

## 2018-05-07 MED ORDER — INSULIN ASPART 100 UNIT/ML ~~LOC~~ SOLN
0.0000 [IU] | Freq: Every day | SUBCUTANEOUS | Status: DC
  Administered 2018-05-07: 2 [IU] via SUBCUTANEOUS

## 2018-05-07 MED ORDER — VENLAFAXINE HCL 75 MG PO TABS: 225.0000 mg | ORAL_TABLET | Freq: Every day | ORAL | Status: DC

## 2018-05-07 MED ORDER — VITAMIN D 1000 UNITS PO TABS
1000.0000 [IU] | ORAL_TABLET | Freq: Every day | ORAL | Status: DC
  Administered 2018-05-07 – 2018-05-09 (×3): 1000 [IU] via ORAL
  Filled 2018-05-07 (×3): qty 1

## 2018-05-07 MED ORDER — DIVALPROEX SODIUM ER 500 MG PO TB24
1000.0000 mg | ORAL_TABLET | Freq: Two times a day (BID) | ORAL | Status: DC
  Administered 2018-05-07 – 2018-05-09 (×5): 1000 mg via ORAL
  Filled 2018-05-07 (×6): qty 2

## 2018-05-07 MED ORDER — ACETAMINOPHEN 325 MG PO TABS
650.0000 mg | ORAL_TABLET | Freq: Four times a day (QID) | ORAL | Status: DC | PRN
  Administered 2018-05-07: 650 mg via ORAL
  Filled 2018-05-07: qty 2

## 2018-05-07 MED ORDER — PANTOPRAZOLE SODIUM 40 MG PO TBEC
40.0000 mg | DELAYED_RELEASE_TABLET | Freq: Two times a day (BID) | ORAL | Status: DC
  Administered 2018-05-07 – 2018-05-09 (×4): 40 mg via ORAL
  Filled 2018-05-07 (×4): qty 1

## 2018-05-07 MED ORDER — CHLORHEXIDINE GLUCONATE CLOTH 2 % EX PADS
6.0000 | MEDICATED_PAD | Freq: Every day | CUTANEOUS | Status: DC
  Administered 2018-05-07 – 2018-05-09 (×4): 6 via TOPICAL

## 2018-05-07 MED ORDER — TRAMADOL HCL 50 MG PO TABS: 50.0000 mg | ORAL_TABLET | Freq: Three times a day (TID) | ORAL | Status: DC | PRN

## 2018-05-07 MED ORDER — INSULIN ASPART 100 UNIT/ML ~~LOC~~ SOLN
0.0000 [IU] | Freq: Three times a day (TID) | SUBCUTANEOUS | Status: DC
  Administered 2018-05-07: 4 [IU] via SUBCUTANEOUS
  Administered 2018-05-07: 7 [IU] via SUBCUTANEOUS
  Administered 2018-05-08: 4 [IU] via SUBCUTANEOUS
  Administered 2018-05-08: 2 [IU] via SUBCUTANEOUS
  Administered 2018-05-08 – 2018-05-09 (×2): 4 [IU] via SUBCUTANEOUS
  Administered 2018-05-09: 7 [IU] via SUBCUTANEOUS

## 2018-05-07 MED ORDER — ORAL CARE MOUTH RINSE
15.0000 mL | Freq: Two times a day (BID) | OROMUCOSAL | Status: DC
  Administered 2018-05-07 – 2018-05-08 (×3): 15 mL via OROMUCOSAL

## 2018-05-07 MED ORDER — ADULT MULTIVITAMIN W/MINERALS CH
1.0000 | ORAL_TABLET | Freq: Every day | ORAL | Status: DC
  Administered 2018-05-07 – 2018-05-09 (×3): 1 via ORAL
  Filled 2018-05-07 (×3): qty 1

## 2018-05-07 MED ORDER — INSULIN GLARGINE 100 UNIT/ML ~~LOC~~ SOLN
10.0000 [IU] | Freq: Every day | SUBCUTANEOUS | Status: DC
  Administered 2018-05-07 – 2018-05-09 (×3): 10 [IU] via SUBCUTANEOUS
  Filled 2018-05-07 (×3): qty 0.1

## 2018-05-07 MED ORDER — CHLORHEXIDINE GLUCONATE 0.12 % MT SOLN
15.0000 mL | Freq: Two times a day (BID) | OROMUCOSAL | Status: DC
  Administered 2018-05-07 – 2018-05-08 (×3): 15 mL via OROMUCOSAL
  Filled 2018-05-07: qty 15

## 2018-05-07 MED ORDER — CHLORHEXIDINE GLUCONATE 0.12 % MT SOLN: 15.0000 mL | Freq: Two times a day (BID) | OROMUCOSAL | Status: DC

## 2018-05-07 MED ORDER — CLONAZEPAM 0.5 MG PO TABS
0.5000 mg | ORAL_TABLET | Freq: Three times a day (TID) | ORAL | Status: DC
  Administered 2018-05-07 – 2018-05-09 (×8): 0.5 mg via ORAL
  Filled 2018-05-07 (×8): qty 1

## 2018-05-07 MED ORDER — VENLAFAXINE HCL ER 75 MG PO CP24
225.0000 mg | ORAL_CAPSULE | Freq: Every day | ORAL | Status: DC
  Administered 2018-05-08 – 2018-05-09 (×2): 225 mg via ORAL
  Filled 2018-05-07 (×2): qty 1

## 2018-05-07 MED FILL — Medication: Qty: 1 | Status: AC

## 2018-05-07 NOTE — Progress Notes (Addendum)
Daily Rounding Note  05/07/2018, 8:37 AM  LOS: 2 days   SUBJECTIVE:   Chief complaint:  She is hungry.  No abd pain.  Slight nausea earlier today, she thinks it is because she is hungry.  Often has nausea but never vomited before last few days.   No BM's overnight.       OBJECTIVE:         Vital signs in last 24 hours:    Temp:  [98.3 F (36.8 C)-99.1 F (37.3 C)] 98.7 F (37.1 C) (08/20 0750) Pulse Rate:  [57-101] 65 (08/20 0725) Resp:  [20-24] 20 (08/20 0725) BP: (87-137)/(55-94) 120/68 (08/20 0725) SpO2:  [85 %-100 %] 100 % (08/20 0725) FiO2 (%):  [60 %] 60 % (08/20 0725) Weight:  [213.2 kg] 213.2 kg (08/20 0500) Last BM Date: (PTA) Filed Weights   05/05/18 1749 05/06/18 0431 05/07/18 0500  Weight: (!) 214.6 kg (!) 212.3 kg (!) 213.2 kg   General: obese, alert, comfortable.  Not acutely ill looking   Heart: RRR Chest: clear bil in front.  Able to speak today, on trach collar, off vent Abdomen: obese, active BS, NT, soft  Extremities: no CCE Neuro/Psych:  Oriented x 3.   Moves all 4s.  No obvious deficits.    Intake/Output from previous day: 08/19 0701 - 08/20 0700 In: 746.9 [P.O.:240; I.V.:506.9] Out: 2610 [Urine:2610]  Intake/Output this shift: Total I/O In: 125 [Other:125] Out: -   Lab Results: Recent Labs    05/06/18 0839 05/06/18 1525 05/07/18 0502  WBC 6.8 7.7 5.8  HGB 10.9* 12.0 11.6*  HCT 34.4* 38.4 37.7  PLT 115* 137* 128*   BMET Recent Labs    05/05/18 1900 05/05/18 1915 05/06/18 1525 05/07/18 0502  NA 134* 137 141 140  K 4.2 4.3 4.0 3.9  CL 91* 89* 98 99  CO2 33*  --  37* 37*  GLUCOSE 309* 311* 146* 148*  BUN 58* 60* 36* 24*  CREATININE 1.10* 1.00 0.93 0.91  CALCIUM 7.3*  --  8.7* 8.7*   LFT Recent Labs    05/05/18 1710 05/05/18 1900  PROT 7.2 5.4*  ALBUMIN 2.7* 2.2*  AST 20 17  ALT 17 13  ALKPHOS 54 42  BILITOT 0.6 0.5    BILIDIR <0.1  --   IBILI NOT CALCULATED  --    PT/INR Recent Labs    05/05/18 1710 05/05/18 2132  LABPROT 13.2 13.5  INR 1.01 1.04   Hepatitis Panel No results for input(s): HEPBSAG, HCVAB, HEPAIGM, HEPBIGM in the last 72 hours.  Studies/Results: Ct Abdomen Pelvis W Contrast  Result Date: 05/05/2018 CLINICAL DATA:  52 year old female with suspected GI bleed. Became hypotensive in the ED. EXAM: CT ABDOMEN AND PELVIS WITH CONTRAST TECHNIQUE: Multidetector CT imaging of the abdomen and pelvis was performed using the standard protocol following bolus administration of intravenous contrast. CONTRAST:  155mL ISOVUE-300 IOPAMIDOL (ISOVUE-300) INJECTION 61% COMPARISON:  Portable radiographs 02/18/2014 and earlier. FINDINGS: Lower chest: Cardiac size at the upper limits of normal. No pericardial effusion. Confluent opacity in the right costophrenic angle most resembles atelectasis. Lesser left costophrenic angle atelectasis. No pleural effusion. The  visible lingula and middle lobe are clear. Hepatobiliary: Negative liver and gallbladder. No biliary ductal enlargement. Pancreas: Negative. Spleen: Negative. Adrenals/Urinary Tract: Normal adrenal glands. Bilateral renal enhancement is symmetric and within normal limits. No perinephric stranding, hydronephrosis, or hydroureter. There is no renal contrast excretion on the delayed images. Stomach/Bowel: Decompressed rectum and distal sigmoid colon. Redundant sigmoid, mildly gas distended in the left lower quadrant gas-filled but nondilated left colon and transverse colon. Redundant right colon. Negative cecum and terminal ileum. Rectus muscle diastasis and ventral abdominal hernia repair with mesh. No dilated small bowel. The stomach is moderately distended with mostly gas and to a lesser extent fluid. Negative duodenum aside from a small 2-3 millimeter duodenal diverticulum in the 2nd portion. No abdominal free air, free fluid. Vascular/Lymphatic: Calcified  aortic atherosclerosis. Suboptimal intravascular contrast bolus, but the major arterial structures in the abdomen and pelvis appear to be patent. The portal venous system appears patent. No lymphadenopathy. Reproductive: Diminutive, negative. Other: No retroperitoneal hemorrhage.  No pelvic free fluid. Musculoskeletal: Chronic right lateral 8th rib fracture. Spinal disc and endplate degeneration. No acute osseous abnormality identified. IMPRESSION: 1. Absent renal contrast excretion on the delayed images might indicate acute intrinsic renal disease such as ATN. 2. No other acute or inflammatory process in the abdomen or pelvis. Negative for retroperitoneal hemorrhage. 3. Lung base atelectasis. 4. Mild calcified aortic atherosclerosis. Electronically Signed   By: Genevie Ann M.D.   On: 05/05/2018 21:17   Dg Chest Port 1 View  Result Date: 05/05/2018 CLINICAL DATA:  Central catheter placement EXAM: PORTABLE CHEST 1 VIEW COMPARISON:  September 29, 2017 FINDINGS: Endotracheal tube tip is 5.5 cm above the carina. Central catheter extends into the superior vena cava where it bends upon itself. The tip is at the junction of the left innominate vein and superior vena cava. No pneumothorax. There is mild patchy infiltrate in the medial right base. Lungs elsewhere are clear. Heart is borderline enlarged with pulmonary vascularity normal. No adenopathy. No bone lesions. IMPRESSION: Tube and catheter positions as described. Note that the central catheter bends upon itself with the tip directed back toward the left innominate vein. No pneumothorax. Patchy infiltrate medial right base. Lungs elsewhere clear. Stable cardiac prominence. Electronically Signed   By: Lowella Grip III M.D.   On: 05/05/2018 20:34   Scheduled Meds: . chlorhexidine  15 mL Mouth Rinse BID  . Chlorhexidine Gluconate Cloth  6 each Topical Daily  . insulin aspart  0-15 Units Subcutaneous Q4H  . mouth rinse  15 mL Mouth Rinse q12n4p  . midazolam  0.5  mg Intravenous Q8H  . [START ON 05/09/2018] pantoprazole  40 mg Intravenous Q12H  . sodium chloride flush  10-40 mL Intracatheter Q12H   Continuous Infusions: . sodium chloride    . pantoprozole (PROTONIX) infusion 8 mg/hr (05/07/18 0600)   PRN Meds:.sodium chloride, ondansetron (ZOFRAN) IV, sodium chloride flush   ASSESMENT:   *  GIB: melena, CGE.   EGD 8/19: irreg Z line (bxd, path pending).  J shaped gastric deformity.  Blood clots and food in stomach.  Antral erythema.  Non-bleeding DU with pigmented spot.  Naprosyn prn, Pepcid at HS, Pantoparzole BID listed on outpt med list.    *  Likely diabetic gastroparesis.    *  Blood loss anemia.  S/p multiple transfusions, PTA from [facility]  hgb stable this AM  *  Azotemia, AKI.  Improved.    *  VDRF.  Chronic nocturnal vent, trach pt.      *  Non-critical thrombocytopenia.  Chronic issue.     PLAN   *   Finishes PPI drip 8/21 at 2100  *  Await path report from esophagus, serum H Pylori.    *   Advance to diabetic diet.    *  Stop NSAIDs long term.    *  Switch to BID Protonix when current bag Protonix finishes.    *   CBC in AM.        Kim Jenkins  05/07/2018, 8:37 AM Phone [phone]

## 2018-05-07 NOTE — Progress Notes (Signed)
Trach care done per RRT at this time. Patent tolerated it well. New inner cannula inserted and locked back into place w/o complications. Patient has her PMV in line at this time tolerating it well. SATs are stable and acceptable at this time on 60% ATC. Patient was suctioned got back moderate amount of thick white frothy secretions. Pt is hemodynamically stable. RRT will monitor patient throughout the night.

## 2018-05-07 NOTE — Progress Notes (Signed)
  Speech Language Pathology Treatment: Dysphagia  Patient Details Name: Kim Jenkins MRN: [id number] DOB: [birth year] Today's Date: 05/07/2018 Time: 6184-8592 SLP Time Calculation (min) (ACUTE ONLY): 13 min  Assessment / Plan / Recommendation Clinical Impression  Pt consumed regular textures and thin liquids with no overt signs of dysphagia or aspiration. Vocal quality remained clear throughout trials with PMV in place. She says she has been wearing it all day since taken off the vent this morning. She feels like she is at her baseline. No further acute SLP needs identified; will sign off.   HPI HPI: Pt is a 52 y.o. female admitted from [facility] with hemorrhagic shock secondary to ABLA, UGI bleed, now s/p transfusion and EGD.  She has a tracheostomy and is night-time ventilator dependent. PMH: GERD, heart failure, OSA, obesity hypoventilation syndrome, HTN, DM, depression, chronic pain, bipolar 1 disorder      SLP Plan  All goals met       Recommendations  Diet recommendations: Regular;Thin liquid Liquids provided via: Cup;Straw Medication Administration: Whole meds with liquid Supervision: Patient able to self feed;Intermittent supervision to cue for compensatory strategies Compensations: Slow rate;Small sips/bites Postural Changes and/or Swallow Maneuvers: Seated upright 90 degrees      Patient may use Passy-Muir Speech Valve: During all waking hours (remove during sleep);During PO intake/meals PMSV Supervision: Intermittent         Oral Care Recommendations: Oral care BID Follow up Recommendations: Skilled Nursing facility SLP Visit Diagnosis: Dysphagia, unspecified (R13.10) Plan: All goals met       GO                [name] 05/07/2018, 12:15 PM  [name], M.A. CCC-SLP [phone]

## 2018-05-07 NOTE — Progress Notes (Addendum)
HISTORY & PHYSICAL  Patient Name: Kim Jenkins MRN: [id number] DOB: 1966/03/29    ADMISSION DATE:  05/05/2018 DATE OF SERVICE:  05/05/2018  CHIEF COMPLAINT:  Melena   HISTORY OF PRESENT ILLNESS  This 52 y.o. morbidly obese Caucasian female presented to the [facility] Emergency Department from [place] (where she has been getting Lovenox for DVT prophylaxis) with complaints of melena and coffee-ground emesis, which reportedly started last night (about 24 hours ago). On presentation, the patient was initially normotensive but subsequently experienced a precipitous drop in blood pressure. She was started on massive transfusion protocol and has received 9 units PRBC and 3 units platelets. At this time, she has SBP in the 110s and is NOT requiring vasopressors. She has a tracheostomy and is night-time ventilator dependent. Nonetheless, she is arousable and interactive at this time. Answers yes/no appropriately and follows commands.  She is a [facility] at [facility], where she has been receiving care since 2015. She is night-time ventilator dependent due to morbid obesity and severe obstructive sleep apnea. She is reported to have a history of GERD and heart failure, in addition to those conditions indicated below in her past medical history.  SUBJECTIVE/INTERVAL:  No events overnight, tolerating PMV this AM  VITAL SIGNS: BP 139/76   Pulse 70   Temp 98.7 F (37.1 C) (Oral)   Resp 20   Wt (!) 213.2 kg   SpO2 97%   BMI 73.61 kg/m   VENTILATOR SETTINGS: Vent Mode: Stand-by FiO2 (%):  [60 %] 60 % Set Rate:  [20 bmp] 20 bmp Vt Set:  [500 mL] 500 mL PEEP:  [5 cmH20] 5 cmH20 Pressure Support:  [12 cmH20] 12 cmH20 Plateau Pressure:  [22 JEH63-14 cmH20] 24 cmH20  INTAKE / OUTPUT: I/O last 3 completed shifts: In: 3306.5 [P.O.:240; I.V.:708.7; IV Piggyback:2357.8] Out: 3285 [Urine:3285]  Physical Exam General:  Morbidly obese female, in exam bed,  NAD Neuro:  Alert, oriented, non-focal HEENT:  [place]/AT, No JVD noted, PERRL Cardiovascular:  Distant, RRR, Nl S1/S2 and -M/R/G Lungs:  Very difficult to hear but appears clear Abdomen:  Obese, soft, NT, ND and +BS Musculoskeletal:  No acute deformity Skin:  Intact, MMM  LABS:  BASIC METABOLIC PROFILE Recent Labs  Lab 05/05/18 1900 05/05/18 1915 05/05/18 2132 05/06/18 1525 05/07/18 0502  NA 134* 137  --  141 140  K 4.2 4.3  --  4.0 3.9  CL 91* 89*  --  98 99  CO2 33*  --   --  37* 37*  BUN 58* 60*  --  36* 24*  CREATININE 1.10* 1.00  --  0.93 0.91  GLUCOSE 309* 311*  --  146* 148*  CALCIUM 7.3*  --   --  8.7* 8.7*  MG  --   --  1.9  --  2.3  PHOS  --   --  3.1  --  2.6   Liver Enzymes Recent Labs  Lab 05/05/18 1710 05/05/18 1900  AST 20 17  ALT 17 13  ALKPHOS 54 42  BILITOT 0.6 0.5  ALBUMIN 2.7* 2.2*   CBC Recent Labs  Lab 05/06/18 0839 05/06/18 1525 05/07/18 0502  WBC 6.8 7.7 5.8  HGB 10.9* 12.0 11.6*  HCT 34.4* 38.4 37.7  PLT 115* 137* 128*   COAGULATION STUDIES Recent Labs  Lab 05/05/18 1710 05/05/18 2132  APTT  --  26  INR 1.01 1.04    SEPSIS MARKERS Recent Labs  Lab 05/05/18 1726 05/05/18 2132 05/05/18 2143  05/06/18 0044 05/06/18 0428 05/07/18 0502  LATICACIDVEN 3.26* 2.3*  --  2.4*  --   --   PROCALCITON  --   --  0.36  --  0.36 0.16    ABG Recent Labs  Lab 05/05/18 1839  PHART 7.345*  PCO2ART 65.4*  PO2ART 91.0    Cardiac Enzymes No results for input(s): TROPONINI, PROBNP in the last 168 hours.  Imaging No results found.  SIGNIFICANT EVENTS: 8/18: admitted with coffee-ground emesis and melena. Massive transfusion in the ER (9 units PRBC, 3 units platelets).  LINES/TUBES: 8/18: L IJ rapid infuser (large bore CVC) inserted  I reviewed CXR myself, trach is in a good position.  ASSESSMENT / PLAN:  52 year old that is super morbidly obese presenting with stomach ulcer with GI following.  Patient is chronically trached  with PMV during the day and SIMV at night.  Discussed with PCCM-NP.    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 Tracheostomy status with mandatory nocturnal ventilation.  - Continue PMV during the day and SIMV at night - SLP for PMV - Continue borvana and pulmicort - VAP bundle   Dysphagia:  - SLP, usually eats during the day  Hypoxemia:  - Titrate O2 for sat of 88-92%  Tracheostomy Status:  - Maintain current trach size and type  PCCM will continue to follow  Rush Farmer, M.D. [facility] Pulmonary/Critical Care Medicine. Pager: [phone]. After hours pager: [phone].

## 2018-05-07 NOTE — Progress Notes (Signed)
Suctioned trach removed PMV  for the night  Placed back on the ventilator for night rest.

## 2018-05-07 NOTE — Clinical Social Work Note (Signed)
Clinical Social Work Assessment  Patient Details  Name: Kim Jenkins MRN: [id number] Date of Birth: [birth year]  Date of referral:  05/07/18               Reason for consult:  Discharge Planning                Permission sought to share information with:  Other Permission granted to share information::  Yes, Verbal Permission Granted  Name::     [facility]  Agency::  facility   Relationship::  [facility] [phone]  Contact Information:  Erline Levine   Housing/Transportation Living arrangements for the past 2 months:  [place] of Information:  Patient Patient Interpreter Needed:  None Criminal Activity/Legal Involvement Pertinent to Current Situation/Hospitalization:  No - Comment as needed Significant Relationships:  Other Family Members, Other(Comment)(facility staff. ) Lives with:  Facility Resident Do you feel safe going back to the place where you live?  Yes Need for family participation in patient care:  Yes (Comment)  Care giving concerns:  CSW consulted due to pt being from facility. CSW aware that pt will need to return to facility once medically stable.    Social Worker assessment / plan:  CSW spoke with pt at bedside. CSW was informed that pt is from [facility] where pt expressed that pt has been for three and a half years. CSW was informed that pt plans to return [facility] at the the time of discharge.   CSW was informed that pt recently met biological family which has been a great experience thus far for pt.   Employment status:  Other (Comment) Insurance information:  Medicare PT Recommendations:  Not assessed at this time Information / Referral to community resources:  [place]  Patient/Family's Response to care:  Pt appeared to be understanding and agreeable to care at this time.   Patient/Family's Understanding of and Emotional Response to Diagnosis, Current Treatment, and Prognosis:  NO further questions or concerns have been  presented to CSW at this time. Emotional support was positive and happy that pt is now being able to engage with biological family.   Emotional Assessment Appearance:  Appears older than stated age Attitude/Demeanor/Rapport:  Engaged Affect (typically observed):  Happy, Pleasant Orientation:  Oriented to Situation, Oriented to Self, Oriented to Place, Oriented to  Time Alcohol / Substance use:  Not Applicable Psych involvement (Current and /or in the community):  No (Comment)  Discharge Needs  Concerns to be addressed:  Denies Needs/Concerns at this time Readmission within the last 30 days:  No Current discharge risk:  Dependent with Mobility Barriers to Discharge:  Continued Medical Work up   [employer], [place] 05/07/2018, 9:16 AM

## 2018-05-07 NOTE — Progress Notes (Signed)
[place] TEAM 1 - Stepdown/ICU TEAM  Kim Jenkins  ZOX:[id number] DOB: 06-05-1966 DOA: 05/05/2018 PCP: Patient, No Pcp Per    Brief Narrative:  52 y.o. morbidly obese F w/ a chronic trach and night-time ventilator dependence who presented to the [facility] ED from [place] (where she has been getting Lovenox for DVT prophylaxis) with complaints of melena and coffee-ground emesis for ~24hrs. On presentation, the patient was initially normotensive but subsequently experienced a precipitous drop in blood pressure. She was started on massive transfusion protocol and has received 9 units PRBC and 3 units platelets.   Significant Events: 8/18 admit from [facility] - massive transfusion in ED 8/19 EGD - normal esoph, blood in stomach, non-bleeding duod ulcer   Subjective: The patient is resting comfortably in bed.  She denies shortness of breath chest pain abdominal pain nausea or vomiting.  She tells me that she is hungry and is ready to be allowed to eat.  Assessment & Plan:  Hemorrhagic shock secondary to ABLA Reportedly transfused 9 units PRBC and 3 units FFP in ED (can't confirm as not recorded in epic) - was on Lovenox at SNF - CT negative for RP bleed - off anticoag at this time   UGI bleed - duod ulcer Off anticoag at this time - lesion was not actively bleeding at time of EGD  Chronic hypoxemic/hypercarbic respiratory failure secondary to severe OSA/OHS S/p trach with mandatory nocturnal ventilation (SIMV 20/500/45/5 + PSV 12) - cont usual vent protocol/TC during day   DM2 Follow CBG as diet advanced   Bipolar disorder Appears well compensated at this time   Chronic pain syndrome Well compensated presently w/ pt denying uncontrolled pain  AKI - resolved   Morbid obesity - Body mass index is 73.61 kg/m.   DVT prophylaxis: SCDs Code Status: FULL CODE Family Communication: no family present at time of exam  Disposition Plan: ICU due to vent dependence   Consultants:   GI  Antimicrobials:  none   Objective: Blood pressure 120/68, pulse 65, temperature 98.7 F (37.1 C), temperature source Oral, resp. rate 20, weight (!) 213.2 kg, SpO2 100 %.  Intake/Output Summary (Last 24 hours) at 05/07/2018 0823 Last data filed at 05/07/2018 0742 Gross per 24 hour  Intake 822.01 ml  Output 2385 ml  Net -1562.99 ml   Filed Weights   05/05/18 1749 05/06/18 0431 05/07/18 0500  Weight: (!) 214.6 kg (!) 212.3 kg (!) 213.2 kg    Examination: General: No acute respiratory distress Lungs: Clear to auscultation bilaterally without wheezes or crackles - very distant BS Cardiovascular: Regular rate and rhythm without murmur gallop or rub normal S1 and S2 Abdomen: Nontender, morbidly obese, soft, bowel sounds positive, no rebound Extremities: diffuse 1+ chronic appearing edema   CBC: Recent Labs  Lab 05/05/18 1710  05/06/18 0839 05/06/18 1525 05/07/18 0502  WBC 12.4*   < > 6.8 7.7 5.8  NEUTROABS 8.4*  --   --   --   --   HGB 6.2*   < > 10.9* 12.0 11.6*  HCT 21.4*   < > 34.4* 38.4 37.7  MCV 94.7   < > 89.1 90.4 92.9  PLT 237   < > 115* 137* 128*   < > = values in this interval not displayed.   Basic Metabolic Panel: Recent Labs  Lab 05/05/18 1900 05/05/18 1915 05/05/18 2132 05/06/18 1525 05/07/18 0502  NA 134* 137  --  141 140  K 4.2 4.3  --  4.0 3.9  CL 91* 89*  --  98 99  CO2 33*  --   --  37* 37*  GLUCOSE 309* 311*  --  146* 148*  BUN 58* 60*  --  36* 24*  CREATININE 1.10* 1.00  --  0.93 0.91  CALCIUM 7.3*  --   --  8.7* 8.7*  MG  --   --  1.9  --  2.3  PHOS  --   --  3.1  --  2.6   GFR: CrCl cannot be calculated (Unknown ideal weight.).  Liver Function Tests: Recent Labs  Lab 05/05/18 1710 05/05/18 1900  AST 20 17  ALT 17 13  ALKPHOS 54 42  BILITOT 0.6 0.5  PROT 7.2 5.4*  ALBUMIN 2.7* 2.2*   Recent Labs  Lab 05/05/18 1710  LIPASE 62*   Recent Labs  Lab 05/05/18 1710  AMMONIA 20    Coagulation Profile: Recent Labs   Lab 05/05/18 1710 05/05/18 2132  INR 1.01 1.04    CBG: Recent Labs  Lab 05/06/18 1652 05/06/18 2011 05/06/18 2344 05/07/18 0455 05/07/18 0816  GLUCAP 143* 146* 152* 132* 149*    Recent Results (from the past 240 hour(s))  Culture, blood (routine x 2)     Status: None (Preliminary result)   Collection Time: 05/05/18  9:32 PM  Result Value Ref Range Status   Specimen Description BLOOD PICC LINE  Final   Special Requests   Final    BOTTLES DRAWN AEROBIC AND ANAEROBIC Blood Culture adequate volume   Culture   Final    NO GROWTH < 24 HOURS Performed at [facility], [place] [address] [place], [place] [address]    Report Status PENDING  Incomplete  MRSA PCR Screening     Status: None   Collection Time: 05/05/18  9:40 PM  Result Value Ref Range Status   MRSA by PCR NEGATIVE NEGATIVE Final    Comment:        The GeneXpert MRSA Assay (FDA approved for NASAL specimens only), is one component of a comprehensive MRSA colonization surveillance program. It is not intended to diagnose MRSA infection nor to guide or monitor treatment for MRSA infections. Performed at [facility], [place] [address], [place], [place] [address]   Culture, blood (routine x 2)     Status: None (Preliminary result)   Collection Time: 05/05/18  9:43 PM  Result Value Ref Range Status   Specimen Description BLOOD RIGHT HAND  Final   Special Requests   Final    BOTTLES DRAWN AEROBIC AND ANAEROBIC Blood Culture adequate volume   Culture   Final    NO GROWTH < 24 HOURS Performed at [facility], [place] [address], [place], [place] [address]    Report Status PENDING  Incomplete  Culture, respiratory (non-expectorated)     Status: None (Preliminary result)   Collection Time: 05/05/18 11:41 PM  Result Value Ref Range Status   Specimen Description TRACHEAL ASPIRATE  Final   Special Requests NONE  Final   Gram Stain   Final    MODERATE WBC PRESENT, PREDOMINANTLY PMN RARE SQUAMOUS EPITHELIAL  CELLS PRESENT MODERATE GRAM POSITIVE COCCI FEW GRAM POSITIVE RODS RARE GRAM NEGATIVE RODS Performed at [facility], [place] [address], [place], [place] [address]    Culture PENDING  Incomplete   Report Status PENDING  Incomplete     Scheduled Meds: . chlorhexidine  15 mL Mouth Rinse BID  . Chlorhexidine Gluconate Cloth  6 each Topical Daily  . insulin aspart  0-15 Units Subcutaneous Q4H  . mouth rinse  15 mL Mouth Rinse q12n4p  . midazolam  0.5 mg Intravenous Q8H  . [START ON 05/09/2018] pantoprazole  40 mg Intravenous Q12H  . sodium chloride flush  10-40 mL Intracatheter Q12H   Continuous Infusions: . sodium chloride    . pantoprozole (PROTONIX) infusion 8 mg/hr (05/07/18 0600)     LOS: 2 days   Cherene Altes, MD [facility] Office  [phone] Pager - Text Page per Amion  If 7PM-7AM, please contact night-coverage per Amion 05/07/2018, 8:23 AM

## 2018-05-08 ENCOUNTER — Telehealth: Payer: Self-pay

## 2018-05-08 LAB — COMPREHENSIVE METABOLIC PANEL
ALBUMIN: 2.5 g/dL — AB (ref 3.5–5.0)
ALK PHOS: 52 U/L (ref 38–126)
ALT: 19 U/L (ref 0–44)
ANION GAP: 6 (ref 5–15)
AST: 25 U/L (ref 15–41)
BUN: 16 mg/dL (ref 6–20)
CALCIUM: 8.7 mg/dL — AB (ref 8.9–10.3)
CO2: 39 mmol/L — AB (ref 22–32)
CREATININE: 0.89 mg/dL (ref 0.44–1.00)
Chloride: 97 mmol/L — ABNORMAL LOW (ref 98–111)
GFR calc Af Amer: >60 mL/min (ref >60)
GFR calc non Af Amer: >60 mL/min (ref >60)
GLUCOSE: 183 mg/dL — AB (ref 70–99)
Potassium: 4.3 mmol/L (ref 3.5–5.1)
SODIUM: 142 mmol/L (ref 135–145)
Total Bilirubin: 0.6 mg/dL (ref 0.3–1.2)
Total Protein: 6 g/dL — ABNORMAL LOW (ref 6.5–8.1)

## 2018-05-08 LAB — CULTURE, RESPIRATORY: CULTURE: NORMAL

## 2018-05-08 LAB — CULTURE, RESPIRATORY W GRAM STAIN

## 2018-05-08 LAB — CBC
HCT: 37.5 % (ref 36.0–46.0)
HEMOGLOBIN: 11.5 g/dL — AB (ref 12.0–15.0)
MCH: 28.8 pg (ref 26.0–34.0)
MCHC: 30.7 g/dL (ref 30.0–36.0)
MCV: 93.8 fL (ref 78.0–100.0)
Platelets: 131 10*3/uL — ABNORMAL LOW (ref 150–400)
RBC: 4 MIL/uL (ref 3.87–5.11)
RDW: 16.4 % — ABNORMAL HIGH (ref 11.5–15.5)
WBC: 6.2 10*3/uL (ref 4.0–10.5)

## 2018-05-08 LAB — GLUCOSE, CAPILLARY
GLUCOSE-CAPILLARY: 185 mg/dL — AB (ref 70–99)
Glucose-Capillary: 189 mg/dL — ABNORMAL HIGH (ref 70–99)
Glucose-Capillary: 212 mg/dL — ABNORMAL HIGH (ref 70–99)
Glucose-Capillary: 190 mg/dL — ABNORMAL HIGH (ref 70–99)

## 2018-05-08 LAB — MAGNESIUM: MAGNESIUM: 2.1 mg/dL (ref 1.7–2.4)

## 2018-05-08 NOTE — Progress Notes (Signed)
[place] TEAM 1 - Stepdown/ICU TEAM  Kim Jenkins  MWU:[id number] DOB: [birth year] DOA: 05/05/2018 PCP: Patient, No Pcp Per    Brief Narrative:  52 y.o. morbidly obese F w/ a chronic trach and night-time ventilator dependence who presented to the [facility] ED from [place] (where she has been getting Lovenox for DVT prophylaxis) with complaints of melena and coffee-ground emesis for ~24hrs. On presentation, the patient was initially normotensive but subsequently experienced a precipitous drop in blood pressure. She was started on massive transfusion protocol and has received 9 units PRBC and 3 units platelets.  EGD done on 05/06/2018 revealed normal esophagus, blood in the stomach with nonbleeding duodenal ulcer.  Subjective: No new complaints. Patient continues to improve slowly. No further GI bleed reported.  Assessment & Plan: Hemorrhagic shock secondary to ABLA: Reportedly transfused 9 units PRBC and 3 units FFP in ED (can't confirm as not recorded in epic) - was on Lovenox at SNF - CT negative for RP bleed - off anticoag at this time  05/08/2018: No further bleeding reported.  We will continue to monitor H/H.  UGI bleed: -Likely due to duodenal ulcer.   Off anticoag at this time - lesion was not actively bleeding at time of EGD  Chronic hypoxemic/hypercarbic respiratory failure secondary to severe OSA/OHS: S/p trach with mandatory nocturnal ventilation (SIMV 20/500/45/5 + PSV 12) - cont usual vent protocol/TC during day 05/08/2018: Remains stable.  Diabetes mellitus type 2:  Continue to optimize.    Morbid obesity: Diet and exercise.  Bipolar disorder: Appears well compensated at this time   Chronic pain syndrome: Well compensated presently w/ pt denying uncontrolled pain  AKI: - resolved   DVT prophylaxis: SCDs Code Status: FULL CODE Family Communication:  Disposition Plan: This will depend on hospital course.   Consultants:  GI  Antimicrobials:  none    Objective: Blood pressure (!) 169/103, pulse 100, temperature 98.4 F (36.9 C), temperature source Oral, resp. rate 20, weight (!) 213.2 kg, SpO2 93 %.  Intake/Output Summary (Last 24 hours) at 05/08/2018 1858 Last data filed at 05/08/2018 1818 Gross per 24 hour  Intake 480 ml  Output 5300 ml  Net -4820 ml   Filed Weights   05/06/18 0431 05/07/18 0500 05/08/18 0800  Weight: (!) 212.3 kg (!) 213.2 kg (!) 213.2 kg    Examination: General: Morbidly obese.  No acute respiratory distress Lungs: Clear to auscultation.   Cardiovascular: S1-S2 heard.   Abdomen: Morbidly obese, nontender, soft.  Organs are difficult to assess.    Extremities: diffuse 1+ chronic appearing edema   CBC: Recent Labs  Lab 05/05/18 1710  05/07/18 0502 05/07/18 1921 05/08/18 0321  WBC 12.4*   < > 5.8 6.5 6.2  NEUTROABS 8.4*  --   --   --   --   HGB 6.2*   < > 11.6* 13.1 11.5*  HCT 21.4*   < > 37.7 43.2 37.5  MCV 94.7   < > 92.9 94.5 93.8  PLT 237   < > 128* 128* 131*   < > = values in this interval not displayed.   Basic Metabolic Panel: Recent Labs  Lab 05/05/18 2132 05/06/18 1525 05/07/18 0502 05/08/18 0321  NA  --  141 140 142  K  --  4.0 3.9 4.3  CL  --  98 99 97*  CO2  --  37* 37* 39*  GLUCOSE  --  146* 148* 183*  BUN  --  36* 24* 16  CREATININE  --  0.93 0.91 0.89  CALCIUM  --  8.7* 8.7* 8.7*  MG 1.9  --  2.3 2.1  PHOS 3.1  --  2.6  --    GFR: CrCl cannot be calculated (Unknown ideal weight.).  Liver Function Tests: Recent Labs  Lab 05/05/18 1710 05/05/18 1900 05/08/18 0321  AST 20 17 25   ALT 17 13 19   ALKPHOS 54 42 52  BILITOT 0.6 0.5 0.6  PROT 7.2 5.4* 6.0*  ALBUMIN 2.7* 2.2* 2.5*   Recent Labs  Lab 05/05/18 1710  LIPASE 62*   Recent Labs  Lab 05/05/18 1710  AMMONIA 20    Coagulation Profile: Recent Labs  Lab 05/05/18 1710 05/05/18 2132  INR 1.01 1.04    CBG: Recent Labs  Lab 05/07/18 1512 05/07/18 2123 05/08/18 0806 05/08/18 1142  05/08/18 1530  GLUCAP 190* 213* 189* 212* 190*    Recent Results (from the past 240 hour(s))  Culture, blood (routine x 2)     Status: None (Preliminary result)   Collection Time: 05/05/18  9:32 PM  Result Value Ref Range Status   Specimen Description BLOOD PICC LINE  Final   Special Requests   Final    BOTTLES DRAWN AEROBIC AND ANAEROBIC Blood Culture adequate volume   Culture   Final    NO GROWTH 3 DAYS Performed at [facility], [place] [address], [place], [place] [address]    Report Status PENDING  Incomplete  MRSA PCR Screening     Status: None   Collection Time: 05/05/18  9:40 PM  Result Value Ref Range Status   MRSA by PCR NEGATIVE NEGATIVE Final    Comment:        The GeneXpert MRSA Assay (FDA approved for NASAL specimens only), is one component of a comprehensive MRSA colonization surveillance program. It is not intended to diagnose MRSA infection nor to guide or monitor treatment for MRSA infections. Performed at [facility], [place] [address], [place], [place] [address]   Culture, blood (routine x 2)     Status: None (Preliminary result)   Collection Time: 05/05/18  9:43 PM  Result Value Ref Range Status   Specimen Description BLOOD RIGHT HAND  Final   Special Requests   Final    BOTTLES DRAWN AEROBIC AND ANAEROBIC Blood Culture adequate volume   Culture   Final    NO GROWTH 3 DAYS Performed at [facility], [place] [address] [place], [place] [address]    Report Status PENDING  Incomplete  Culture, respiratory (non-expectorated)     Status: None   Collection Time: 05/05/18 11:41 PM  Result Value Ref Range Status   Specimen Description TRACHEAL ASPIRATE  Final   Special Requests NONE  Final   Gram Stain   Final    MODERATE WBC PRESENT, PREDOMINANTLY PMN RARE SQUAMOUS EPITHELIAL CELLS PRESENT MODERATE GRAM POSITIVE COCCI FEW GRAM POSITIVE RODS RARE GRAM NEGATIVE RODS    Culture   Final    MODERATE Consistent with normal respiratory  flora. Performed at [facility], [place] [address] [place], [place] [address]    Report Status 05/08/2018 FINAL  Final     Scheduled Meds: . ARIPiprazole  5 mg Oral Daily  . chlorhexidine  15 mL Mouth Rinse BID  . Chlorhexidine Gluconate Cloth  6 each Topical Daily  . cholecalciferol  1,000 Units Oral Daily  . clonazePAM  0.5 mg Oral Q8H  . divalproex  1,000 mg Oral Q12H  . insulin  aspart  0-20 Units Subcutaneous TID WC  . insulin aspart  0-5 Units Subcutaneous QHS  . insulin glargine  10 Units Subcutaneous Daily  . insulin glargine  20 Units Subcutaneous QHS  . mouth rinse  15 mL Mouth Rinse q12n4p  . multivitamin with minerals  1 tablet Oral Daily  . olopatadine  1 drop Both Eyes QHS  . pantoprazole  40 mg Oral BID  . sodium chloride flush  10-40 mL Intracatheter Q12H  . torsemide  20 mg Oral Daily  . traZODone  150 mg Oral QHS  . venlafaxine XR  225 mg Oral Q breakfast  . [START ON 05/09/2018] Vitamin D (Ergocalciferol)  50,000 Units Oral Weekly   Continuous Infusions: . sodium chloride       LOS: 3 days   Bonnell Public, MD [facility] Office  [phone] Pager - Text Page per Shea Evans  If 7PM-7AM, please contact night-coverage per Amion 05/08/2018, 6:58 PM

## 2018-05-08 NOTE — Telephone Encounter (Signed)
-----   Message from Irving Copas., MD sent at 05/07/2018 11:26 PM EDT ----- Regarding: Patient needs follow up EGD Dear Chong Sicilian,  I hope that you are well. This patient needs a follow EGD In 2-2.5 months with me. Will need to be done in hospital due to her comorbidities. Thanks.  Chester Holstein

## 2018-05-08 NOTE — Progress Notes (Signed)
HISTORY & PHYSICAL  Patient Name: Kim Jenkins MRN: [id number] DOB: [birth year]    ADMISSION DATE:  05/05/2018 DATE OF SERVICE:  05/05/2018  CHIEF COMPLAINT:  Melena   HISTORY OF PRESENT ILLNESS  This 52 y.o. morbidly obese Caucasian female presented to the [facility] Emergency Department from [place] (where she has been getting Lovenox for DVT prophylaxis) with complaints of melena and coffee-ground emesis, which reportedly started last night (about 24 hours ago). On presentation, the patient was initially normotensive but subsequently experienced a precipitous drop in blood pressure. She was started on massive transfusion protocol and has received 9 units PRBC and 3 units platelets. At this time, she has SBP in the 110s and is NOT requiring vasopressors. She has a tracheostomy and is night-time ventilator dependent. Nonetheless, she is arousable and interactive at this time. Answers yes/no appropriately and follows commands.  She is a resident at [facility], where she has been receiving care since 2015. She is night-time ventilator dependent due to morbid obesity and severe obstructive sleep apnea. She is reported to have a history of GERD and heart failure, in addition to those conditions indicated below in her past medical history.  SUBJECTIVE/INTERVAL:  No events overnight, eating this AM  VITAL SIGNS: BP 127/69 (BP Location: Left Wrist)   Pulse 76   Temp 98.4 F (36.9 C) (Oral)   Resp 20   Wt (!) 213.2 kg   SpO2 97%   BMI 73.61 kg/m   VENTILATOR SETTINGS: Vent Mode: Stand-by FiO2 (%):  [60 %] 60 % Set Rate:  [20 bmp] 20 bmp Vt Set:  [500 mL] 500 mL PEEP:  [5 cmH20] 5 cmH20 Plateau Pressure:  [19 cmH20] 19 cmH20  INTAKE / OUTPUT: I/O last 3 completed shifts: In: 2867.6 [P.O.:2270; I.V.:397.6; Other:200] Out: 5435 [Urine:5435]  Physical Exam General:  Morbidly obese female, NAD, PMV Neuro:  Alert and oriented, moving all ext to  command HEENT:  [place]/AT, PERRL, EOM-I and MMM Cardiovascular:  Decreased BS diffusely Lungs:  Distant Abdomen:  Obese, soft, NT, ND and +BS Musculoskeletal:  No acute deformity Skin:  Intact, MMM  LABS:  BASIC METABOLIC PROFILE Recent Labs  Lab 05/05/18 2132 05/06/18 1525 05/07/18 0502 05/08/18 0321  NA  --  141 140 142  K  --  4.0 3.9 4.3  CL  --  98 99 97*  CO2  --  37* 37* 39*  BUN  --  36* 24* 16  CREATININE  --  0.93 0.91 0.89  GLUCOSE  --  146* 148* 183*  CALCIUM  --  8.7* 8.7* 8.7*  MG 1.9  --  2.3 2.1  PHOS 3.1  --  2.6  --    Liver Enzymes Recent Labs  Lab 05/05/18 1710 05/05/18 1900 05/08/18 0321  AST 20 17 25   ALT 17 13 19   ALKPHOS 54 42 52  BILITOT 0.6 0.5 0.6  ALBUMIN 2.7* 2.2* 2.5*   CBC Recent Labs  Lab 05/07/18 0502 05/07/18 1921 05/08/18 0321  WBC 5.8 6.5 6.2  HGB 11.6* 13.1 11.5*  HCT 37.7 43.2 37.5  PLT 128* 128* 131*   COAGULATION STUDIES Recent Labs  Lab 05/05/18 1710 05/05/18 2132  APTT  --  26  INR 1.01 1.04    SEPSIS MARKERS Recent Labs  Lab 05/05/18 1726 05/05/18 2132 05/05/18 2143 05/06/18 0044 05/06/18 0428 05/07/18 0502  LATICACIDVEN 3.26* 2.3*  --  2.4*  --   --   PROCALCITON  --   --  0.36  --  0.36 0.16    ABG Recent Labs  Lab 05/05/18 1839  PHART 7.345*  PCO2ART 65.4*  PO2ART 91.0    Cardiac Enzymes No results for input(s): TROPONINI, PROBNP in the last 168 hours.  Imaging  SIGNIFICANT EVENTS: 8/18: admitted with coffee-ground emesis and melena. Massive transfusion in the ER (9 units PRBC, 3 units platelets).  LINES/TUBES: 8/18: L IJ rapid infuser (large bore CVC) inserted  I reviewed CXR myself, trach is in a good position  ASSESSMENT / PLAN:  52 year old that is super morbidly obese presenting with stomach ulcer with GI following.  Patient is chronically trached with PMV during the day and SIMV at night.  Discussed with PCCM-NP.    Chronic hypoxemic/hypercarbic respiratory failure  secondary to severe OSA/OHS. Tracheostomy status with mandatory nocturnal ventilation.  - Continue PMV during the day and SIMV at night - Appreciate input from SLP, start diet - Continue brovana and pulmicort - VAP bundle   Dysphagia:  - Diet per SLP  Hypoxemia:  - Titrate O2 for sat of 88-92%  Tracheostomy Status:  - Maintain current trach type and size  PCCM will continue to follow  Rush Farmer, M.D. [facility] Pulmonary/Critical Care Medicine. Pager: [phone]. After hours pager: [phone].

## 2018-05-08 NOTE — Telephone Encounter (Signed)
Reminder to call pt and set up hospt EGD

## 2018-05-09 LAB — TYPE AND SCREEN
ABO/RH(D): O POS
Antibody Screen: NEGATIVE
UNIT DIVISION: 0
UNIT DIVISION: 0
UNIT DIVISION: 0
UNIT DIVISION: 0
UNIT DIVISION: 0
UNIT DIVISION: 0
Unit division: 0
Unit division: 0
Unit division: 0
Unit division: 0
Unit division: 0
Unit division: 0
Unit division: 0
Unit division: 0
Unit division: 0
Unit division: 0
Unit division: 0

## 2018-05-09 LAB — BPAM RBC
BLOOD PRODUCT EXPIRATION DATE: 201909152359
BLOOD PRODUCT EXPIRATION DATE: 201909152359
BLOOD PRODUCT EXPIRATION DATE: 201909152359
BLOOD PRODUCT EXPIRATION DATE: 201909152359
BLOOD PRODUCT EXPIRATION DATE: 201909152359
BLOOD PRODUCT EXPIRATION DATE: 201909152359
BLOOD PRODUCT EXPIRATION DATE: 201909152359
BLOOD PRODUCT EXPIRATION DATE: 201909172359
BLOOD PRODUCT EXPIRATION DATE: 201909172359
BLOOD PRODUCT EXPIRATION DATE: 201909172359
BLOOD PRODUCT EXPIRATION DATE: 201909202359
Blood Product Expiration Date: 201909142359
Blood Product Expiration Date: 201909142359
Blood Product Expiration Date: 201909152359
Blood Product Expiration Date: 201909152359
Blood Product Expiration Date: 201909172359
Blood Product Expiration Date: 201909202359
ISSUE DATE / TIME: 201908181803
ISSUE DATE / TIME: 201908181803
ISSUE DATE / TIME: 201908181803
ISSUE DATE / TIME: 201908181820
ISSUE DATE / TIME: 201908181820
ISSUE DATE / TIME: 201908181820
ISSUE DATE / TIME: 201908181820
ISSUE DATE / TIME: 201908181820
ISSUE DATE / TIME: 201908181820
ISSUE DATE / TIME: 201908181829
ISSUE DATE / TIME: 201908181910
ISSUE DATE / TIME: 201908181910
ISSUE DATE / TIME: 201908181910
ISSUE DATE / TIME: 201908181910
ISSUE DATE / TIME: 201908192130
ISSUE DATE / TIME: 201908200839
ISSUE DATE / TIME: 201908200914
UNIT TYPE AND RH: 5100
UNIT TYPE AND RH: 5100
UNIT TYPE AND RH: 5100
UNIT TYPE AND RH: 5100
UNIT TYPE AND RH: 5100
UNIT TYPE AND RH: 5100
UNIT TYPE AND RH: 5100
Unit Type and Rh: 5100
Unit Type and Rh: 5100
Unit Type and Rh: 5100
Unit Type and Rh: 5100
Unit Type and Rh: 5100
Unit Type and Rh: 5100
Unit Type and Rh: 5100
Unit Type and Rh: 5100
Unit Type and Rh: 5100
Unit Type and Rh: 5100

## 2018-05-09 LAB — CBC WITH DIFFERENTIAL/PLATELET
Abs Immature Granulocytes: 0 10*3/uL (ref 0.0–0.1)
Basophils Absolute: 0 10*3/uL (ref 0.0–0.1)
Basophils Relative: 1 %
Eosinophils Absolute: 0.2 10*3/uL (ref 0.0–0.7)
Eosinophils Relative: 2 %
HCT: 41.3 % (ref 36.0–46.0)
Hemoglobin: 12.5 g/dL (ref 12.0–15.0)
Immature Granulocytes: 0 %
Lymphocytes Relative: 21 %
Lymphs Abs: 1.6 10*3/uL (ref 0.7–4.0)
MCH: 28.4 pg (ref 26.0–34.0)
MCHC: 30.3 g/dL (ref 30.0–36.0)
MCV: 93.9 fL (ref 78.0–100.0)
Monocytes Absolute: 0.7 10*3/uL (ref 0.1–1.0)
Monocytes Relative: 10 %
Neutro Abs: 4.9 10*3/uL (ref 1.7–7.7)
Neutrophils Relative %: 66 %
Platelets: 124 10*3/uL — ABNORMAL LOW (ref 150–400)
RBC: 4.4 MIL/uL (ref 3.87–5.11)
RDW: 16.2 % — ABNORMAL HIGH (ref 11.5–15.5)
WBC: 7.5 10*3/uL (ref 4.0–10.5)

## 2018-05-09 LAB — RENAL FUNCTION PANEL
Albumin: 2.9 g/dL — ABNORMAL LOW (ref 3.5–5.0)
Anion gap: 5 (ref 5–15)
BUN: 10 mg/dL (ref 6–20)
CO2: 41 mmol/L — ABNORMAL HIGH (ref 22–32)
Calcium: 9.2 mg/dL (ref 8.9–10.3)
Chloride: 92 mmol/L — ABNORMAL LOW (ref 98–111)
Creatinine, Ser: 0.83 mg/dL (ref 0.44–1.00)
GFR calc Af Amer: >60 mL/min (ref >60)
GFR calc non Af Amer: >60 mL/min (ref >60)
Glucose, Bld: 168 mg/dL — ABNORMAL HIGH (ref 70–99)
Phosphorus: 2.9 mg/dL (ref 2.5–4.6)
Potassium: 4 mmol/L (ref 3.5–5.1)
Sodium: 138 mmol/L (ref 135–145)

## 2018-05-09 LAB — GLUCOSE, CAPILLARY
GLUCOSE-CAPILLARY: 158 mg/dL — AB (ref 70–99)
GLUCOSE-CAPILLARY: 201 mg/dL — AB (ref 70–99)

## 2018-05-09 LAB — MAGNESIUM: Magnesium: 2 mg/dL (ref 1.7–2.4)

## 2018-05-09 NOTE — Progress Notes (Signed)
Patient taken off vent at this time and placed on ATC with PMV. Tolerating well

## 2018-05-09 NOTE — Progress Notes (Signed)
CSW consulted for pt retuning back to [facility] today. CSW spoke with Erline Levine from [facility] and was informed that they can take pt back today if discharged. RN updated and informed CSW that RN would need to check with [facility] to see if they are ready to discharge pt today. CSW will continue to follow for further discharge needs.    Virgie Dad Dai Mcadams, MSW, [facility] Emergency Department Clinical Social Worker [phone]

## 2018-05-09 NOTE — Discharge Summary (Signed)
Physician Discharge Summary  Patient ID: Kim Jenkins MRN: [id number] DOB/AGE: 1966-04-21 52 y.o.  Admit date: 05/05/2018 Discharge date: 05/09/2018  Admission Diagnoses:  Discharge Diagnoses:  Principal Problem:   Hemorrhagic shock ([place]) Active Problems:   Chronic respiratory failure with hypoxia and hypercapnia (HCC)   Morbid obesity (HCC)   GI bleed   Acute blood loss anemia   Tracheostomy care (HCC)   OSA (obstructive sleep apnea)   DM2 (diabetes mellitus, type 2) (HCC)   Bipolar 1 disorder (HCC)   Chronic pain   Depression   Acute kidney injury ([place])   Acute upper GI bleed   Pressure injury of skin   Acute on chronic respiratory failure with hypoxemia (HCC)   Hypercarbia   Hypoxemia   Discharged Condition: stable  Hospital Course: Patient is a 52 year old female, morbidly obese, with past medical history significant for chronic trach and night-time ventilator dependence, OSA, obesity hypoventilation syndrome, panniculitis, nephrolithiasis, hypertension, diabetes mellitus, chronic pain, depression and bipolar disorder.  Patient was admitted from [place] (where she has been getting Lovenox for DVT prophylaxis) with complaints of melena and coffee-ground emesis.  On presentation, the patient was initially normotensive but subsequently experienced a precipitous drop in blood pressure.  Patient was started on massive transfusion protocol and has received 9 units PRBC and 3 units platelets.  EGD done on 05/06/2018 revealed normal esophagus, blood in the stomach with nonbleeding duodenal ulcer.  Patient has remained stable.  Patient will need repeat EGD in about 2 months.  Patient will need to follow with primary care provider and GI team on discharge.  Hemoglobin this morning was 12.5 g/dL.  Hemorrhagic shock secondary toABLA: Reportedly transfused 9 units PRBC and 3 units FFP in ED ( Patient was on Lovenox at SNF  Patient is currently off anticoagulation. Further  bleeding. Hemoglobin has remained stable. Hemoglobin prior to discharge was 12.5 g/dL.  UGI bleed: -Likely due to duodenal ulcer.   -Off anticoag at this time - lesion was not actively bleeding at time of EGD -Patient will need repeat EGD in 2 months.  Chronichypoxemic/hypercarbic respiratory failure secondary to severe OSA/OHS: S/p trach with mandatory nocturnal ventilation (SIMV 20/500/45/5 + PSV 12) -  Usual vent protocol was continued, and trach collar during the day.   Critical care team assisting with vent management.  Diabetes mellitus type 2:  Continue to optimize.    Morbid obesity: Diet and exercise.  Bipolar disorder: Appears well compensated at this time   Chronic pain syndrome: Well controlled.    Acute kidney injury: Resolved.   Consults: GI.  Critical care team for trach management while on the vent.  Significant Diagnostic Studies: Patient underwent EGD.  Kindly see above.  Discharge Exam: Blood pressure (!) 146/75, pulse 72, temperature 98.7 F (37.1 C), temperature source Oral, resp. rate 19, weight (!) 206 kg, SpO2 96 %.   Disposition: Discharge disposition: 03-Skilled Nursing Facility   Discharge Instructions    Diet - low sodium heart healthy   Complete by:  As directed    Increase activity slowly   Complete by:  As directed      Allergies as of 05/09/2018      Reactions   Cefazolin    Meperidine And Related       Medication List    STOP taking these medications   bismuth subsalicylate 409 WJ/19JY suspension Commonly known as:  PEPTO BISMOL   diclofenac sodium 1 % Gel Commonly known as:  VOLTAREN   diphenhydrAMINE 25 mg  capsule Commonly known as:  BENADRYL   guaiFENesin 600 MG 12 hr tablet Commonly known as:  MUCINEX   loperamide 2 MG tablet Commonly known as:  IMODIUM A-D   naproxen 250 MG tablet Commonly known as:  NAPROSYN   ondansetron 4 MG disintegrating tablet Commonly known as:  ZOFRAN-ODT   PROBIOTIC  PO   promethazine 25 MG tablet Commonly known as:  PHENERGAN     TAKE these medications   acetaminophen 325 MG tablet Commonly known as:  TYLENOL Take 650 mg by mouth every 6 (six) hours as needed for mild pain, moderate pain, fever or headache.   ARIPiprazole 5 MG tablet Commonly known as:  ABILIFY Take 5 mg by mouth daily.   chlorhexidine 4 % external liquid Commonly known as:  HIBICLENS Apply 1 application topically daily. For bath   Cholecalciferol 1000 units tablet Take 1,000 Units by mouth daily.   clonazePAM 0.5 MG tablet Commonly known as:  KLONOPIN Take 0.5 mg by mouth every 8 (eight) hours. In addition pt. May take 0.5mg  every 12 hours as needed for anxiety.   clotrimazole-betamethasone cream Commonly known as:  LOTRISONE Apply 1 application topically daily.   divalproex 500 MG 24 hr tablet Commonly known as:  DEPAKOTE ER Take 1,000 mg by mouth every 12 (twelve) hours.   ergocalciferol 50000 units capsule Commonly known as:  VITAMIN D2 Take 50,000 Units by mouth once a week.   eucerin cream Apply 1 application topically daily. Apply to bilateral lower extremity areas   famotidine 20 MG tablet Commonly known as:  PEPCID Take 20 mg by mouth at bedtime.   fluticasone 50 MCG/ACT nasal spray Commonly known as:  FLONASE Place 2 sprays into both nostrils daily.   insulin glargine 100 UNIT/ML injection Commonly known as:  LANTUS Inject 10-40 Units into the skin See admin instructions. Inject 10 units every morning and 40 units every night.   ipratropium-albuterol 0.5-2.5 (3) MG/3ML Soln Commonly known as:  DUONEB Take 3 mLs by nebulization every 4 (four) hours as needed (SOB/wheezing).   lidocaine 2 % jelly Commonly known as:  XYLOCAINE 1 application by Tracheal Tube route every 4 (four) hours as needed (cough).   liraglutide 18 MG/3ML Sopn Commonly known as:  VICTOZA Inject 1.8 mg into the skin daily.   loratadine 10 MG tablet Commonly known as:   CLARITIN Take 10 mg by mouth daily.   metoprolol tartrate 25 MG tablet Commonly known as:  LOPRESSOR Take 12.5 mg by mouth 2 (two) times daily.   multivitamin with minerals Tabs tablet Take 1 tablet by mouth daily.   nystatin cream Commonly known as:  MYCOSTATIN Apply 1 application topically 3 (three) times daily. Apply under breast and abdominal fold for yeast   pantoprazole 40 MG tablet Commonly known as:  PROTONIX Take 40 mg by mouth 2 (two) times daily.   PAZEO 0.7 % Soln Generic drug:  Olopatadine HCl Apply 1 drop to eye at bedtime.   sodium chloride 0.65 % Soln nasal spray Commonly known as:  OCEAN Place 2 sprays into both nostrils every 2 (two) hours as needed for congestion.   SYSTANE BALANCE 0.6 % Soln Generic drug:  Propylene Glycol Place 1 drop into both eyes every 6 (six) hours as needed (dry eyes).   torsemide 20 MG tablet Commonly known as:  DEMADEX Take 20 mg by mouth daily.   traMADol 50 MG tablet Commonly known as:  ULTRAM Take 50 mg by mouth every 8 (eight) hours  as needed for moderate pain or severe pain.   traZODone 150 MG tablet Commonly known as:  DESYREL Take 150 mg by mouth at bedtime.   venlafaxine 75 MG tablet Commonly known as:  EFFEXOR Take 225 mg by mouth daily.   ZEASORB powder Generic drug:  talc Apply 1 application topically every 12 (twelve) hours. cleanse bilateral buttocks and ischials with normal saline, dry, and apply powder      Time spent discharging patient: 33 minutes.  SignedBonnell Public 05/09/2018, 11:02 AM

## 2018-05-09 NOTE — Progress Notes (Addendum)
2:29pm-CSW made aware that [facility] is here to transport pt however they are requesting EMTALA form. CSW advised RN that MD discharging pt is to complete this and give to [facility]. CSW has paged MD at this time to get form completed.   CSW has sent over discharge orders and summary to [facility] at this time. RN to call report to [phone]. Pt going to room 312. There are no further CSW needs at this time, CSW will sign off.   Jeanette Caprice S. Coe Angelos, MSW, [place] [facility] Clinical Social Worker [phone]

## 2018-05-10 LAB — CULTURE, BLOOD (ROUTINE X 2)
CULTURE: NO GROWTH
Culture: NO GROWTH
Special Requests: ADEQUATE
Special Requests: ADEQUATE

## 2018-07-11 ENCOUNTER — Telehealth: Payer: Self-pay

## 2018-07-11 NOTE — Telephone Encounter (Signed)
-----   Message from Timothy Lasso, RN sent at 07/08/2018  8:43 AM EDT -----   ----- Message ----- From: Timothy Lasso, RN Sent: 07/08/2018 To: Timothy Lasso, RN  This patient needs a follow EGD In 2-2.5 months with me. Will need to be done in hospital due to her comorbidities.

## 2018-07-11 NOTE — Telephone Encounter (Signed)
Left message on machine to call back  

## 2018-07-12 NOTE — Telephone Encounter (Signed)
Tried to call the pt at [facility] in [place] to set up EGD f/u.  I was placed on hold for 6 minutes.  I will try again later

## 2018-07-15 NOTE — Telephone Encounter (Signed)
Message left for the pt's nurse to return call

## 2018-07-31 ENCOUNTER — Other Ambulatory Visit: Payer: Self-pay

## 2018-07-31 DIAGNOSIS — K920 Hematemesis: Secondary | ICD-10-CM

## 2018-07-31 DIAGNOSIS — K921 Melena: Secondary | ICD-10-CM

## 2018-07-31 DIAGNOSIS — D649 Anemia, unspecified: Secondary | ICD-10-CM

## 2018-07-31 NOTE — Telephone Encounter (Signed)
I have tried on several occasions to reach the pt or nurse at her facility in [place].  I will continue to try and reach her as well as put the procedure recall in [facility]

## 2018-07-31 NOTE — Telephone Encounter (Signed)
I spoke with a nurse at [facility] [phone] and was advised to fax the instructions to [phone].  I did as requested and asked that someone call me to confirm receipt of the message.

## 2018-08-28 ENCOUNTER — Encounter (HOSPITAL_COMMUNITY): Admission: RE | Payer: Self-pay | Source: Ambulatory Visit

## 2018-08-28 ENCOUNTER — Ambulatory Visit (HOSPITAL_COMMUNITY): Admission: RE | Admit: 2018-08-28 | Payer: Medicare Other | Source: Ambulatory Visit | Admitting: Gastroenterology

## 2018-08-28 SURGERY — ESOPHAGOGASTRODUODENOSCOPY (EGD) WITH PROPOFOL
Anesthesia: Monitor Anesthesia Care

## 2018-08-28 MED ORDER — PROPOFOL 10 MG/ML IV BOLUS
INTRAVENOUS | Status: AC
  Filled 2018-08-28: qty 40

## 2018-08-28 NOTE — Progress Notes (Signed)
Pt has not shown up for procedure at [facility] with Dr. Rush Landmark  attemped to call number for address listed on chart. This facility, [facility]  stated they did not have a patient by this name. The only address listed in this chart was for this facility listed above. There is no phone number listed for patient. I then attempted to call the number listed that was called by Dwyane Dee RN in November that she spoke to someone regarding faxing the information. This number was [facility]. They stated they did not have anyone by this name at their facility.  Dr. Rush Landmark is aware of above.

## 2018-08-29 ENCOUNTER — Telehealth: Payer: Self-pay

## 2018-08-29 NOTE — Telephone Encounter (Signed)
-----   Message from Irving Copas., MD sent at 08/28/2018  4:56 PM EST ----- Regarding: Patient missed procedure Dear Chong Sicilian, I hope you are well. This patient did not show up for her procedure. Our RNs here reached out to the numbers in the chart and were told she was no longer a resident at that facility. If possible we should see if we can reach out to her or a family member and see if we can get her to come into clinic to see how she is doing. If unable to reach her, then should go ahead and place a Certified letter in the mail to patient. Thanks. Chester Holstein

## 2018-08-29 NOTE — Telephone Encounter (Signed)
Left message on machine to call back  

## 2018-08-30 NOTE — Telephone Encounter (Signed)
Unable to reach the pt letter mailed

## 2018-09-13 ENCOUNTER — Other Ambulatory Visit: Payer: Self-pay

## 2018-09-13 ENCOUNTER — Encounter (HOSPITAL_COMMUNITY): Payer: Self-pay

## 2018-09-13 ENCOUNTER — Emergency Department (HOSPITAL_COMMUNITY)
Admission: EM | Admit: 2018-09-13 | Discharge: 2018-09-13 | Disposition: A | Discharge status: Skilled Nursing Facility | Payer: Medicare Other | Attending: Emergency Medicine | Admitting: Emergency Medicine

## 2018-09-13 ENCOUNTER — Emergency Department (HOSPITAL_COMMUNITY): Payer: Medicare Other

## 2018-09-13 DIAGNOSIS — F322 Major depressive disorder, single episode, severe without psychotic features: Secondary | ICD-10-CM | POA: Diagnosis not present

## 2018-09-13 DIAGNOSIS — R45851 Suicidal ideations: Secondary | ICD-10-CM | POA: Insufficient documentation

## 2018-09-13 DIAGNOSIS — I10 Essential (primary) hypertension: Secondary | ICD-10-CM | POA: Diagnosis not present

## 2018-09-13 DIAGNOSIS — Z930 Tracheostomy status: Secondary | ICD-10-CM | POA: Diagnosis not present

## 2018-09-13 DIAGNOSIS — F329 Major depressive disorder, single episode, unspecified: Secondary | ICD-10-CM | POA: Diagnosis present

## 2018-09-13 DIAGNOSIS — Z794 Long term (current) use of insulin: Secondary | ICD-10-CM | POA: Insufficient documentation

## 2018-09-13 DIAGNOSIS — E119 Type 2 diabetes mellitus without complications: Secondary | ICD-10-CM | POA: Insufficient documentation

## 2018-09-13 DIAGNOSIS — Z79899 Other long term (current) drug therapy: Secondary | ICD-10-CM | POA: Diagnosis not present

## 2018-09-13 LAB — RAPID URINE DRUG SCREEN, HOSP PERFORMED
Amphetamines: NOT DETECTED
Barbiturates: NOT DETECTED
Benzodiazepines: NOT DETECTED
Cocaine: NOT DETECTED
OPIATES: NOT DETECTED
Tetrahydrocannabinol: NOT DETECTED

## 2018-09-13 LAB — CBC WITH DIFFERENTIAL/PLATELET
Abs Immature Granulocytes: 0.03 10*3/uL (ref 0.00–0.07)
Basophils Absolute: 0 10*3/uL (ref 0.0–0.1)
Basophils Relative: 0 %
EOS ABS: 0.2 10*3/uL (ref 0.0–0.5)
EOS PCT: 2 %
HEMATOCRIT: 36.2 % (ref 36.0–46.0)
Hemoglobin: 11.2 g/dL — ABNORMAL LOW (ref 12.0–15.0)
IMMATURE GRANULOCYTES: 0 %
LYMPHS ABS: 1.7 10*3/uL (ref 0.7–4.0)
Lymphocytes Relative: 21 %
MCH: 29 pg (ref 26.0–34.0)
MCHC: 30.9 g/dL (ref 30.0–36.0)
MCV: 93.8 fL (ref 80.0–100.0)
MONOS PCT: 9 %
Monocytes Absolute: 0.7 10*3/uL (ref 0.1–1.0)
Neutro Abs: 5.4 10*3/uL (ref 1.7–7.7)
Neutrophils Relative %: 68 %
Platelets: 201 10*3/uL (ref 150–400)
RBC: 3.86 MIL/uL — ABNORMAL LOW (ref 3.87–5.11)
RDW: 14 % (ref 11.5–15.5)
WBC: 8 10*3/uL (ref 4.0–10.5)
nRBC: 0 % (ref 0.0–0.2)

## 2018-09-13 LAB — COMPREHENSIVE METABOLIC PANEL
ALK PHOS: 68 U/L (ref 38–126)
ALT: 35 U/L (ref 0–44)
AST: 33 U/L (ref 15–41)
Albumin: 3.1 g/dL — ABNORMAL LOW (ref 3.5–5.0)
Anion gap: 9 (ref 5–15)
BILIRUBIN TOTAL: 0.3 mg/dL (ref 0.3–1.2)
BUN: 25 mg/dL — AB (ref 6–20)
CALCIUM: 9.2 mg/dL (ref 8.9–10.3)
CO2: 38 mmol/L — ABNORMAL HIGH (ref 22–32)
CREATININE: 0.77 mg/dL (ref 0.44–1.00)
Chloride: 92 mmol/L — ABNORMAL LOW (ref 98–111)
GFR calc Af Amer: >60 mL/min (ref >60)
Glucose, Bld: 158 mg/dL — ABNORMAL HIGH (ref 70–99)
Potassium: 4.2 mmol/L (ref 3.5–5.1)
Sodium: 139 mmol/L (ref 135–145)
TOTAL PROTEIN: 8.1 g/dL (ref 6.5–8.1)

## 2018-09-13 LAB — ETHANOL: Alcohol, Ethyl (B): <10 mg/dL (ref <10)

## 2018-09-13 MED ORDER — ARIPIPRAZOLE 5 MG PO TABS: 5.0000 mg | ORAL_TABLET | Freq: Every day | ORAL | 0 refills | Status: DC

## 2018-09-13 MED ORDER — TRAZODONE HCL 150 MG PO TABS: 150.0000 mg | ORAL_TABLET | Freq: Every day | ORAL | 0 refills | Status: DC

## 2018-09-13 MED ORDER — GUAIFENESIN 100 MG/5ML PO SYRP
100.0000 mg | ORAL_SOLUTION | Freq: Four times a day (QID) | ORAL | Status: DC | PRN
  Filled 2018-09-13: qty 5

## 2018-09-13 MED ORDER — ACETAMINOPHEN 325 MG PO TABS: 650.0000 mg | ORAL_TABLET | Freq: Four times a day (QID) | ORAL | Status: DC | PRN

## 2018-09-13 MED ORDER — DIVALPROEX SODIUM ER 500 MG PO TB24: 1000.0000 mg | ORAL_TABLET | Freq: Two times a day (BID) | ORAL | Status: DC

## 2018-09-13 MED ORDER — TRAZODONE HCL 50 MG PO TABS: 150.0000 mg | ORAL_TABLET | Freq: Every day | ORAL | Status: DC

## 2018-09-13 MED ORDER — TORSEMIDE 20 MG PO TABS: 20.0000 mg | ORAL_TABLET | Freq: Every day | ORAL | Status: DC

## 2018-09-13 MED ORDER — CARRINGTON MOISTURE BARRIER EX CREA: 1.0000 "application " | TOPICAL_CREAM | Freq: Every day | CUTANEOUS | Status: DC

## 2018-09-13 MED ORDER — VITAMIN D3 25 MCG (1000 UNIT) PO TABS: 1000.0000 [IU] | ORAL_TABLET | Freq: Every day | ORAL | Status: DC

## 2018-09-13 MED ORDER — VITAMIN D (ERGOCALCIFEROL) 1.25 MG (50000 UNIT) PO CAPS: 50000.0000 [IU] | ORAL_CAPSULE | ORAL | Status: DC

## 2018-09-13 MED ORDER — METOPROLOL TARTRATE 25 MG PO TABS: 50.0000 mg | ORAL_TABLET | Freq: Two times a day (BID) | ORAL | Status: DC

## 2018-09-13 MED ORDER — CHLORHEXIDINE GLUCONATE 4 % EX LIQD: 1.0000 "application " | Freq: Every day | CUTANEOUS | Status: DC

## 2018-09-13 MED ORDER — OLOPATADINE HCL 0.1 % OP SOLN: 1.0000 [drp] | Freq: Two times a day (BID) | OPHTHALMIC | Status: DC

## 2018-09-13 MED ORDER — ARIPIPRAZOLE 5 MG PO TABS: 5.0000 mg | ORAL_TABLET | Freq: Every day | ORAL | Status: DC

## 2018-09-13 MED ORDER — INSULIN GLARGINE 100 UNIT/ML ~~LOC~~ SOLN: 10.0000 [IU] | SUBCUTANEOUS | Status: DC

## 2018-09-13 MED ORDER — VENLAFAXINE HCL ER 75 MG PO CP24: 225.0000 mg | ORAL_CAPSULE | Freq: Every day | ORAL | Status: DC

## 2018-09-13 MED ORDER — PROPYLENE GLYCOL 0.6 % OP SOLN: 1.0000 [drp] | Freq: Four times a day (QID) | OPHTHALMIC | Status: DC | PRN

## 2018-09-13 MED ORDER — VENLAFAXINE HCL 75 MG PO TABS: 225.0000 mg | ORAL_TABLET | Freq: Every day | ORAL | 0 refills | Status: DC

## 2018-09-13 MED ORDER — DIVALPROEX SODIUM ER 500 MG PO TB24: 1000.0000 mg | ORAL_TABLET | Freq: Two times a day (BID) | ORAL | 0 refills | Status: AC

## 2018-09-13 MED ORDER — EXENATIDE ER 2 MG ~~LOC~~ PEN: 2.0000 mg | PEN_INJECTOR | SUBCUTANEOUS | Status: DC

## 2018-09-13 MED ORDER — PANTOPRAZOLE SODIUM 40 MG PO TBEC: 40.0000 mg | DELAYED_RELEASE_TABLET | Freq: Every day | ORAL | Status: DC

## 2018-09-13 MED ORDER — LIDOCAINE HCL URETHRAL/MUCOSAL 2 % EX GEL: 1.0000 "application " | CUTANEOUS | Status: DC | PRN

## 2018-09-13 MED ORDER — SALINE SPRAY 0.65 % NA SOLN: 2.0000 | NASAL | Status: DC | PRN

## 2018-09-13 MED ORDER — ADULT MULTIVITAMIN W/MINERALS CH: 1.0000 | ORAL_TABLET | Freq: Every day | ORAL | Status: DC

## 2018-09-13 MED ORDER — TALC EX POWD: 1.0000 "application " | Freq: Two times a day (BID) | CUTANEOUS | Status: DC

## 2018-09-13 MED ORDER — FAMOTIDINE 20 MG PO TABS: 20.0000 mg | ORAL_TABLET | Freq: Every day | ORAL | Status: DC

## 2018-09-13 MED ORDER — IPRATROPIUM-ALBUTEROL 0.5-2.5 (3) MG/3ML IN SOLN: 3.0000 mL | RESPIRATORY_TRACT | Status: DC | PRN

## 2018-09-13 MED ORDER — TRAMADOL HCL 50 MG PO TABS: 50.0000 mg | ORAL_TABLET | Freq: Three times a day (TID) | ORAL | Status: DC | PRN

## 2018-09-13 MED ORDER — ONDANSETRON HCL 4 MG PO TABS: 4.0000 mg | ORAL_TABLET | Freq: Four times a day (QID) | ORAL | Status: DC | PRN

## 2018-09-13 MED ORDER — FLUTICASONE PROPIONATE 50 MCG/ACT NA SUSP: 2.0000 | Freq: Every day | NASAL | Status: DC

## 2018-09-13 MED ORDER — CLONAZEPAM 0.5 MG PO TABS: 0.5000 mg | ORAL_TABLET | Freq: Three times a day (TID) | ORAL | Status: DC

## 2018-09-13 MED ORDER — LORATADINE 10 MG PO TABS: 10.0000 mg | ORAL_TABLET | Freq: Every day | ORAL | Status: DC

## 2018-09-13 NOTE — BHH Counselor (Signed)
TTS went to assess patient, she was in X-Ray

## 2018-09-13 NOTE — Progress Notes (Signed)
2nd shift ED CSW received a handoff from the 1st shift [facility] ED CSW.   CSW received a call from disposition stating pt has been cleared psychiatrically to return to [facility] and that pt is on 12 liters of oxygen.  CSW will continue to follow for D/C needs.  Alphonse Guild. Chancy Claros, LCSW, LCAS, [facility] Clinical Social Worker Ph: [phone]

## 2018-09-13 NOTE — ED Notes (Signed)
Bed: GA48 Expected date:  Expected time:  Means of arrival:  Comments: Hold for TCU 28

## 2018-09-13 NOTE — ED Notes (Signed)
[facility] called  

## 2018-09-13 NOTE — Progress Notes (Addendum)
CSW called:   [facility]  Address: [address], [place],  [place] [address]  Phone: [phone]  CSW called [phone] and left a VM for Hansel Starling and informed her via HIPPA-compliant VM that pt is ready for D/C.  CSW left two messages.  5:56 PM CSW called and attempted 4 times to get through to let staff know pt is ready for D/C and was unable to speak to a live person and finally got through to the sub acute unit.  CSW spoke to [facility] Pt has been accepted by: [facility] for return Number for report is: Report  has been called Pt's unit/room/bed number will be: [phone] Accepting physician: SNF MD  Pt can arrive ASAP on 12/27  Alphonse Guild. Riordan Walle, LCSW, LCAS, CSI Clinical Social Worker Ph: [phone]

## 2018-09-13 NOTE — ED Notes (Signed)
Called [facility] for ETA. Stated that they only have once ACLS truck, and should be 30 mins more.

## 2018-09-13 NOTE — ED Notes (Signed)
Pt. Has wound on Right thigh, with significant MASD on bottom. Will need dressing changes BID in hospital per EDP in hospital. She also has not had her q shift trach care today. Oxygen was also low during admission, O2 increased from 10 L to 12 L due to low oxygen saturations. States she may need to be suctioned. Also multiple scratches from patient scratching self.  Foley was also exchanged 3 days ago.

## 2018-09-13 NOTE — BH Assessment (Signed)
Assessment Note  Kim Jenkins is an 52 y.o. female presenting voluntarily to [facility] ED from her SNF, [facility], complaining of worsening depression and suicidal ideation. Patient states she has been experiencing suicidal thoughts since 2-3 days prior to Christmas. Patient states this is historically a hard time of year for her since the rest of her family is deceased. She also states that she feels like she has been mistreated by some of the staff at her assisted living, making her feel more isolated and depressed. Patient endorses passive SI without plan or intent. Patient states "I would never actually do anything to hurt myself." Patient endorses depressive symptoms of crying spells, isolation, feelings of helplessness/worthlessness, and a loss of pleasure. Patient reports she has a previous diagnosis of bipolar from "years ago." Patient reports no prior psychiatric hospitalizations. Patient reports she does not have a psychiatrist or a therapist. Patient reports she is prescribed Trazadone and Klonopin by her PCP but her residence has been "out" of her Trazadone for 4 days, worsening her depression. Patient denies any prior suicide attempts or self harm. Patient denies any substance use. Patient denies any criminal charges.  Patient was alert and oriented x 4 at time of assessment. She was dressed in scrubs and made good eye contact. Her mood was depressed and her affect was congruent. She cried at periods through out assessment. Her insight, judgement, and impulse control are fair.  Diagnosis: F32.2 MDD, recurrent, severe  Past Medical History:  Past Medical History:  Diagnosis Date  . Bipolar 1 disorder ([place])   . Chronic pain   . Depression   . Diabetes mellitus ([place])   . Hypertension   . Kidney stones   . Morbid obesity ([place])   . Obesity hypoventilation syndrome ([place])   . Obstructive sleep apnea   . Panniculitis     Past Surgical History:  Procedure Laterality Date  .  ESOPHAGOGASTRODUODENOSCOPY N/A 05/06/2018   Procedure: ESOPHAGOGASTRODUODENOSCOPY (EGD);  Surgeon: Irving Copas., MD;  Location: [place];  Service: Gastroenterology;  Laterality: N/A;  . RIGHT OOPHORECTOMY    . TRACHEOSTOMY    . VENTRAL HERNIA REPAIR      Family History: No family history on file.  Social History:  reports that she has never smoked. She has never used smokeless tobacco. She reports that she does not drink alcohol or use drugs.  Additional Social History:  Alcohol / Drug Use Pain Medications: see MAR Prescriptions: see MAR Over the Counter: see MAR Longest period of sobriety (when/how long): patient denies  CIWA: CIWA-Ar BP: (!) 143/77 Pulse Rate: 82 COWS:    Allergies:  Allergies  Allergen Reactions  . Meperidine And Related Shortness Of Breath  . Cefazolin Other (See Comments)    Per MAR    Home Medications: (Not in a hospital admission)   OB/GYN Status:  No LMP recorded. Patient is postmenopausal.  General Assessment Data Assessment unable to be completed: Yes Reason for not completing assessment: (TTS went to complete assessment but was in X-Ray) Location of Assessment: [facility] ED TTS Assessment: In system Is this a Tele or Face-to-Face Assessment?: Face-to-Face Is this an Initial Assessment or a Re-assessment for this encounter?: Initial Assessment Patient Accompanied by:: N/A Language Other than English: No Living Arrangements: In Assisted Living/Nursing Home (Comment: Name of Nursing Home([facility]) What gender do you identify as?: Female Marital status: Single Maiden name: Wrenn Pregnancy Status: No Living Arrangements: Other (Comment)(SNF) Can pt return to current living arrangement?: Yes Admission Status:  Voluntary Is patient capable of signing voluntary admission?: Yes Referral Source: Other(SNF SW) Insurance type: Medicare     Crisis Care Plan Living Arrangements: Other (Comment)(SNF)     Risk to self with  the past 6 months Suicidal Ideation: Yes-Currently Present Has patient been a risk to self within the past 6 months prior to admission? : No Suicidal Intent: No Has patient had any suicidal intent within the past 6 months prior to admission? : No Is patient at risk for suicide?: No Suicidal Plan?: No Has patient had any suicidal plan within the past 6 months prior to admission? : No Access to Means: No What has been your use of drugs/alcohol within the last 12 months?: patient denies Previous Attempts/Gestures: No How many times?: 0 Other Self Harm Risks: none Triggers for Past Attempts: None known Intentional Self Injurious Behavior: None Family Suicide History: No Recent stressful life event(s): Recent negative physical changes Persecutory voices/beliefs?: No Depression: Yes Depression Symptoms: Despondent, Tearfulness, Isolating, Fatigue, Guilt, Loss of interest in usual pleasures, Feeling worthless/self pity, Feeling angry/irritable Substance abuse history and/or treatment for substance abuse?: No Suicide prevention information given to non-admitted patients: Not applicable  Risk to Others within the past 6 months Homicidal Ideation: No Does patient have any lifetime risk of violence toward others beyond the six months prior to admission? : No Thoughts of Harm to Others: No Current Homicidal Intent: No Current Homicidal Plan: No Access to Homicidal Means: No Identified Victim: none History of harm to others?: No Assessment of Violence: None Noted Violent Behavior Description: none noted Does patient have access to weapons?: No Criminal Charges Pending?: No Does patient have a court date: No Is patient on probation?: No  Psychosis Hallucinations: None noted Delusions: None noted  Mental Status Report Appearance/Hygiene: In scrubs Eye Contact: Good Motor Activity: Freedom of movement Speech: Logical/coherent Level of Consciousness: Alert Mood: Depressed Affect:  Depressed Anxiety Level: Minimal Thought Processes: Coherent, Relevant Judgement: Impaired Orientation: Person, Place, Time, Situation Obsessive Compulsive Thoughts/Behaviors: None  Cognitive Functioning Concentration: Normal Memory: Recent Intact, Remote Intact Is patient IDD: No Insight: Fair Impulse Control: Fair Appetite: Good Have you had any weight changes? : No Change Sleep: No Change Total Hours of Sleep: 8(patient reports if takes Trazedone) Vegetative Symptoms: None  ADLScreening [facility] Assessment Services) Patient's cognitive ability adequate to safely complete daily activities?: No Patient able to express need for assistance with ADLs?: Yes Independently performs ADLs?: Yes (appropriate for developmental age)  Prior Inpatient Therapy Prior Inpatient Therapy: No  Prior Outpatient Therapy Prior Outpatient Therapy: Yes Prior Therapy Dates: UTA Prior Therapy Facilty/Provider(s): UTA Reason for Treatment: bipolar disorder Does patient have an ACCT team?: No Does patient have Intensive In-House Services?  : No Does patient have [facility] services? : No Does patient have P4CC services?: No  ADL Screening (condition at time of admission) Patient's cognitive ability adequate to safely complete daily activities?: No Is the patient deaf or have difficulty hearing?: No Does the patient have difficulty seeing, even when wearing glasses/contacts?: No Does the patient have difficulty concentrating, remembering, or making decisions?: No Patient able to express need for assistance with ADLs?: Yes Does the patient have difficulty dressing or bathing?: No Independently performs ADLs?: Yes (appropriate for developmental age) Does the patient have difficulty walking or climbing stairs?: No Weakness of Legs: None Weakness of Arms/Hands: None  Home Assistive Devices/Equipment Home Assistive Devices/Equipment: Wheelchair, Vent/Trach supplies, Oxygen  Therapy Consults (therapy  consults require a physician order) PT Evaluation Needed: No OT  Evalulation Needed: No SLP Evaluation Needed: No Abuse/Neglect Assessment (Assessment to be complete while patient is alone) Abuse/Neglect Assessment Can Be Completed: Yes Physical Abuse: Denies Verbal Abuse: Denies Sexual Abuse: Denies Exploitation of patient/patient's resources: Denies Self-Neglect: Denies Values / Beliefs Cultural Requests During Hospitalization: None Spiritual Requests During Hospitalization: None Consults Spiritual Care Consult Needed: No Social Work Consult Needed: No Regulatory affairs officer (For Healthcare) Does Patient Have a Medical Advance Directive?: No Would patient like information on creating a medical advance directive?: No - Patient declined          Disposition: Per Waylan Boga, NP patient is psych cleared for discharge and recommends patient be restarted on home medications. Roselyn Reef, [place] informed of disposition. Disposition Initial Assessment Completed for this Encounter: Yes Patient referred to: Outpatient clinic referral  On Site Evaluation by:   Reviewed with Physician:    Orvis Brill 09/13/2018 4:01 PM

## 2018-09-13 NOTE — ED Notes (Signed)
Bed: WA27 Expected date:  Expected time:  Means of arrival:  Comments: EMS-SI

## 2018-09-13 NOTE — ED Notes (Signed)
Report given to Nurse Thedore Mins at [facility].

## 2018-09-13 NOTE — Discharge Instructions (Signed)
It was my pleasure taking care of you today!   We have refilled your behavioral health medications.   You will need to follow up with your doctors for further discussion about your mental health.   Return to ER for new or worsening symptoms, any additional concerns.

## 2018-09-13 NOTE — ED Provider Notes (Addendum)
[facility] DEPT Provider Note   CSN: [id number] Arrival date & time: 09/13/18  1258     History   Chief Complaint Chief Complaint  Patient presents with  . Suicidal    HPI Randall Rampersad is a 52 y.o. female.  The history is provided by the patient and medical records. No language interpreter was used.   Zorana Brockwell is a 52 y.o. female  with a PMH of obesity hypoventilation syndrome with trach in place, bipolar disorder, HTN, DM, chronic pain who presents to the Emergency Department for suicidal thoughts.  Patient states that she has been living at [facility] for the last 4 years and is tired of being treated poorly there.  She feels as if she is being treated "like a dog".  The holiday season has been rough for her as she does not have any family to visit.  This made her in a depressed mood.  She states that she has been having "crazy thoughts" about how it would be easier just to kill herself than to live like this.  She denies any plan.  No homicidal thoughts.  No auditory or visual hallucinations.   Past Medical History:  Diagnosis Date  . Bipolar 1 disorder ([place])   . Chronic pain   . Depression   . Diabetes mellitus ([place])   . Hypertension   . Kidney stones   . Morbid obesity ([place])   . Obesity hypoventilation syndrome ([place])   . Obstructive sleep apnea   . Panniculitis     Patient Active Problem List   Diagnosis Date Noted  . Hypoxemia   . Pressure injury of skin 05/06/2018  . Acute on chronic respiratory failure with hypoxemia ([place])   . Hypercarbia   . GI bleed 05/05/2018  . Hemorrhagic shock ([place]) 05/05/2018  . Acute blood loss anemia 05/05/2018  . Tracheostomy care ([place]) 05/05/2018  . OSA (obstructive sleep apnea) 05/05/2018  . DM2 (diabetes mellitus, type 2) ([place]) 05/05/2018  . Bipolar 1 disorder ([place]) 05/05/2018  . Chronic pain 05/05/2018  . Depression 05/05/2018  . Acute kidney injury ([place]) 05/05/2018  . Acute upper GI bleed   .  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 Morbid obesity ([place]) 02/17/2014  . Chronic respiratory failure with hypoxia and hypercapnia ([place]) 01/23/2014  . Acute encephalopathy 01/23/2014    Past Surgical History:  Procedure Laterality Date  . ESOPHAGOGASTRODUODENOSCOPY N/A 05/06/2018   Procedure: ESOPHAGOGASTRODUODENOSCOPY (EGD);  Surgeon: Irving Copas., MD;  Location: [place];  Service: Gastroenterology;  Laterality: N/A;  . RIGHT OOPHORECTOMY    . TRACHEOSTOMY    . VENTRAL HERNIA REPAIR       OB History   No obstetric history on file.      Home Medications    Prior to Admission medications   Medication Sig Start Date End Date Taking? Authorizing Provider  acetaminophen (TYLENOL) 325 MG tablet Take 650 mg by mouth every 6 (six) hours as needed for mild pain, moderate pain, fever or headache.   Yes [provider]  ARIPiprazole (ABILIFY) 5 MG tablet Take 5 mg by mouth daily.   Yes [provider]  chlorhexidine (HIBICLENS) 4 % external liquid Apply 1 application topically daily. For bath   Yes [provider]  Cholecalciferol 1000 units tablet Take 1,000 Units by mouth daily.   Yes [provider]  clonazePAM (KLONOPIN) 0.5 MG tablet Take 0.5 mg by mouth every 8 (eight) hours. In addition pt. May take 0.5mg  every 12  hours as needed for anxiety.   Yes [provider]  divalproex (DEPAKOTE ER) 500 MG 24 hr tablet Take 1,000 mg by mouth every 12 (twelve) hours.   Yes [provider]  ergocalciferol (VITAMIN D2) 50000 units capsule Take 50,000 Units by mouth once a week. On Saturday   Yes [provider]  Exenatide ER (BYDUREON) 2 MG PEN Inject 2 mg into the skin every Thursday.   Yes [provider]  famotidine (PEPCID) 20 MG tablet Take 20 mg by mouth at bedtime.   Yes [provider]  fluticasone (FLONASE) 50 MCG/ACT nasal spray Place 2 sprays into both nostrils daily.   Yes [provider]  guaifenesin (ROBITUSSIN) 100 MG/5ML syrup Take 100 mg by mouth every 6 (six) hours as needed for cough.   Yes [provider]  insulin glargine (LANTUS) 100 UNIT/ML injection Inject 10-40 Units into the skin See admin instructions. Inject 10 units every morning and 40 units every night.   Yes [provider]  ipratropium-albuterol (DUONEB) 0.5-2.5 (3) MG/3ML SOLN Take 3 mLs by nebulization every 4 (four) hours as needed (SOB/wheezing).   Yes [provider]  lidocaine (XYLOCAINE) 2 % jelly 1 application by Tracheal Tube route every 4 (four) hours as needed (cough).   Yes [provider]  loratadine (CLARITIN) 10 MG tablet Take 10 mg by mouth daily.    Yes [provider]  metoprolol tartrate (LOPRESSOR) 50 MG tablet Take 50 mg by mouth 2 (two) times daily.   Yes [provider]  Multiple Vitamin (MULTIVITAMIN WITH MINERALS) TABS tablet Take 1 tablet by mouth daily.   Yes [provider]  olopatadine (PATANOL) 0.1 % ophthalmic solution Place 1 drop into both eyes 2 (two) times daily.   Yes [provider]  ondansetron (ZOFRAN) 4 MG tablet Take 4 mg by mouth every 6 (six) hours as needed for nausea or vomiting.   Yes [provider]  pantoprazole (PROTONIX) 40 MG tablet Take 40 mg by mouth daily.    Yes [provider]  Propylene Glycol (SYSTANE BALANCE) 0.6 % SOLN Place 1 drop into both eyes every 6 (six) hours as needed (dry eyes).   Yes [provider]  Skin Protectants, Misc. (EUCERIN) cream Apply 1 application topically daily. Apply to bilateral lower extremity areas   Yes [provider]  sodium chloride (OCEAN) 0.65 % SOLN nasal spray Place 2 sprays into both nostrils every 2 (two) hours as needed for congestion.   Yes [provider]  talc (ZEASORB) powder Apply 1 application topically every 12 (twelve) hours. cleanse bilateral buttocks and ischials with normal  saline, dry, and apply powder   Yes [provider]  torsemide (DEMADEX) 20 MG tablet Take 20 mg by mouth daily.   Yes [provider]  traMADol (ULTRAM) 50 MG tablet Take 50 mg by mouth every 8 (eight) hours as needed for moderate pain or severe pain.   Yes [provider]  traZODone (DESYREL) 150 MG tablet Take 150 mg by mouth at bedtime.   Yes [provider]  venlafaxine (EFFEXOR) 75 MG tablet Take 225 mg by mouth daily.   Yes [provider]    Family History No family history on file.  Social History Social History   Tobacco Use  . Smoking status: Never Smoker  . Smokeless tobacco: Never Used  Substance Use Topics  . Alcohol use: No    Frequency: Never  . Drug use:  No     Allergies   Meperidine and related and Cefazolin   Review of Systems Review of Systems  Skin: Positive for wound.  Psychiatric/Behavioral: Positive for suicidal ideas.  All other systems reviewed and are negative.    Physical Exam Updated Vital Signs BP (!) 143/77 (BP Location: Right Arm)   Pulse 82   Temp 98.4 F (36.9 C) (Oral)   Resp 18   SpO2 92%   Physical Exam Vitals signs and nursing note reviewed.  Constitutional:      General: She is not in acute distress.    Appearance: She is well-developed.  HENT:     Head: Normocephalic and atraumatic.  Neck:     Comments: Trach collar in place. Cardiovascular:     Rate and Rhythm: Normal rate and regular rhythm.     Heart sounds: Normal heart sounds. No murmur.  Pulmonary:     Effort: Pulmonary effort is normal. No respiratory distress.     Breath sounds: Normal breath sounds.  Abdominal:     General: There is no distension.     Palpations: Abdomen is soft.     Tenderness: There is no abdominal tenderness.  Skin:    General: Skin is warm and dry.  Neurological:     Mental Status: She is alert and oriented to person, place, and time.      ED Treatments / Results  Labs (all labs  ordered are listed, but only abnormal results are displayed) Labs Reviewed  CBC WITH DIFFERENTIAL/PLATELET - Abnormal; Notable for the following components:      Result Value   RBC 3.86 (*)    Hemoglobin 11.2 (*)    All other components within normal limits  RAPID URINE DRUG SCREEN, HOSP PERFORMED  COMPREHENSIVE METABOLIC PANEL  ETHANOL    EKG None  Radiology Dg Chest 2 View  Result Date: 09/13/2018 CLINICAL DATA:  Hypertension.  Tracheostomy present EXAM: CHEST - 2 VIEW COMPARISON:  May 05, 2018 FINDINGS: Tracheostomy catheter tip is 6.5 cm above the carina. No pneumothorax. There is no appreciable edema or consolidation. Heart is upper normal in size with pulmonary vascularity normal. No adenopathy. No bone lesions. IMPRESSION: Tracheostomy as described without pneumothorax. No edema or consolidation. Heart is upper normal in size. Electronically Signed   By: Lowella Grip III M.D.   On: 09/13/2018 14:48    Procedures Procedures (including critical care time)  Medications Ordered in ED Medications  ARIPiprazole (ABILIFY) tablet 5 mg (has no administration in time range)  chlorhexidine (HIBICLENS) 4 % liquid 1 application (has no administration in time range)  acetaminophen (TYLENOL) tablet 650 mg (has no administration in time range)  cholecalciferol (VITAMIN D) tablet 1,000 Units (has no administration in time range)  clonazePAM (KLONOPIN) tablet 0.5 mg (has no administration in time range)  divalproex (DEPAKOTE ER) 24 hr tablet 1,000 mg (has no administration in time range)  Vitamin D (Ergocalciferol) (DRISDOL) capsule 50,000 Units (has no administration in time range)  Exenatide ER PEN 2 mg (has no administration in time range)  famotidine (PEPCID) tablet 20 mg (has no administration in time range)  fluticasone (FLONASE) 50 MCG/ACT nasal spray 2 spray (has no administration in time range)  guaifenesin (ROBITUSSIN) 100 MG/5ML syrup 100 mg (has no administration in time  range)  insulin glargine (LANTUS) injection 10-40 Units (has no administration in time range)  ipratropium-albuterol (DUONEB) 0.5-2.5 (3) MG/3ML nebulizer solution 3 mL (has no administration in time range)  lidocaine (XYLOCAINE) 2 %  jelly 1 application (has no administration in time range)  loratadine (CLARITIN) tablet 10 mg (has no administration in time range)  metoprolol tartrate (LOPRESSOR) tablet 50 mg (has no administration in time range)  multivitamin with minerals tablet 1 tablet (has no administration in time range)  olopatadine (PATANOL) 0.1 % ophthalmic solution 1 drop (has no administration in time range)  ondansetron (ZOFRAN) tablet 4 mg (has no administration in time range)  pantoprazole (PROTONIX) EC tablet 40 mg (has no administration in time range)  Propylene Glycol 0.6 % SOLN 1 drop (has no administration in time range)  eucerin cream 1 application (has no administration in time range)  sodium chloride (OCEAN) 0.65 % nasal spray 2 spray (has no administration in time range)  talc powder 1 application (has no administration in time range)  torsemide (DEMADEX) tablet 20 mg (has no administration in time range)  traMADol (ULTRAM) tablet 50 mg (has no administration in time range)  traZODone (DESYREL) tablet 150 mg (has no administration in time range)  venlafaxine (EFFEXOR) tablet 225 mg (has no administration in time range)     Initial Impression / Assessment and Plan / ED Course  I have reviewed the triage vital signs and the nursing notes.  Pertinent labs & imaging results that were available during my care of the patient were reviewed by me and considered in my medical decision making (see chart for details).    Alexxus Sobh is a 52 y.o. female who presents to ED from [facility] for suicidal thoughts. She is a trach patient with trach collar in place. CXR with no acute findings. Pressure wound to right posterior thigh and between beds noted with no signs of infection at  present. BID dressing changes ordered.   TTS states that patient is psychiatrically cleared. Recommends refilling psych rx as she is out of them. Will prescribe refills for her meds.   Patient discussed with Dr. Laverta Baltimore who agrees with treatment plan.    Final Clinical Impressions(s) / ED Diagnoses   Final diagnoses:  Tracheostomy in place [facility])    ED Discharge Orders    None       , Ozella Almond, PA-C 09/13/18 1546    , Ozella Almond, PA-C 09/13/18 [place], Wonda [facility], MD 09/13/18 1836

## 2018-09-13 NOTE — ED Triage Notes (Signed)
Per EMS-states suicidal due to being off meds-patient has a trach and is vented at night-patient has a bed sore between her legs-patient is from [facility]

## 2018-09-13 NOTE — BHH Counselor (Signed)
Per Waylan Boga, NP patient is psych cleared for discharge and recommends patient be restarted on home medications. Roselyn Reef, [place] informed of disposition.

## 2018-09-13 NOTE — ED Notes (Signed)
Pt is alert and oriented x 4 and is verbally responsive and bedbound with Trach.airway patent.  Pt states that she does and she doesn't want to die. Pt states that she would not take her own life,  suffers from severe depression. Pt states that her health and being in pain all the time makes her feel as if she wants to die, so she doesn't have to suffer. Pt denies  Any auditory and visual hallucinations.

## 2018-09-13 NOTE — Progress Notes (Signed)
CSW aware patient is from [facility]. CSW reached out to [place], [employer] at [facility] [phone], for more information as to what brought patient here. Per Marzetta Board, she heard patient reported that she was suicidal. Per Marzetta Board, facility does not have a psych doctor on staff and so patient was sent here. Per Marzetta Board, she would have her Director of Nursing reach out to [place]. CSW aware there is a TTS consult in place for patient. CSW to await recommendation and return call.  Ollen Barges, [place] Work Department  [employer]  [phone].

## 2018-11-09 ENCOUNTER — Encounter (HOSPITAL_COMMUNITY): Payer: Self-pay | Admitting: Emergency Medicine

## 2018-11-09 ENCOUNTER — Other Ambulatory Visit: Payer: Self-pay

## 2018-11-09 ENCOUNTER — Emergency Department (HOSPITAL_COMMUNITY)
Admission: EM | Admit: 2018-11-09 | Discharge: 2018-11-10 | Disposition: A | Discharge status: Home / Self Care | Payer: Medicare Other | Attending: Emergency Medicine | Admitting: Emergency Medicine

## 2018-11-09 ENCOUNTER — Emergency Department (HOSPITAL_COMMUNITY): Payer: Medicare Other

## 2018-11-09 DIAGNOSIS — Z930 Tracheostomy status: Secondary | ICD-10-CM | POA: Insufficient documentation

## 2018-11-09 DIAGNOSIS — I10 Essential (primary) hypertension: Secondary | ICD-10-CM | POA: Diagnosis not present

## 2018-11-09 DIAGNOSIS — R4182 Altered mental status, unspecified: Secondary | ICD-10-CM | POA: Diagnosis not present

## 2018-11-09 DIAGNOSIS — Z79899 Other long term (current) drug therapy: Secondary | ICD-10-CM | POA: Insufficient documentation

## 2018-11-09 DIAGNOSIS — R600 Localized edema: Secondary | ICD-10-CM | POA: Diagnosis not present

## 2018-11-09 DIAGNOSIS — Z794 Long term (current) use of insulin: Secondary | ICD-10-CM | POA: Diagnosis not present

## 2018-11-09 DIAGNOSIS — L03116 Cellulitis of left lower limb: Secondary | ICD-10-CM | POA: Insufficient documentation

## 2018-11-09 DIAGNOSIS — E119 Type 2 diabetes mellitus without complications: Secondary | ICD-10-CM | POA: Diagnosis not present

## 2018-11-09 DIAGNOSIS — R509 Fever, unspecified: Secondary | ICD-10-CM

## 2018-11-09 DIAGNOSIS — L03119 Cellulitis of unspecified part of limb: Secondary | ICD-10-CM

## 2018-11-09 LAB — TROPONIN I: Troponin I: 0.04 ng/mL (ref <0.03)

## 2018-11-09 LAB — COMPREHENSIVE METABOLIC PANEL
ALT: 15 U/L (ref 0–44)
AST: 17 U/L (ref 15–41)
Albumin: 2.7 g/dL — ABNORMAL LOW (ref 3.5–5.0)
Alkaline Phosphatase: 68 U/L (ref 38–126)
Anion gap: 12 (ref 5–15)
BUN: 25 mg/dL — ABNORMAL HIGH (ref 6–20)
CO2: 34 mmol/L — ABNORMAL HIGH (ref 22–32)
Calcium: 9.3 mg/dL (ref 8.9–10.3)
Chloride: 90 mmol/L — ABNORMAL LOW (ref 98–111)
Creatinine, Ser: 1.11 mg/dL — ABNORMAL HIGH (ref 0.44–1.00)
GFR, EST NON AFRICAN AMERICAN: 56 mL/min — AB (ref >60)
Glucose, Bld: 163 mg/dL — ABNORMAL HIGH (ref 70–99)
Potassium: 4.5 mmol/L (ref 3.5–5.1)
Sodium: 136 mmol/L (ref 135–145)
Total Bilirubin: 0.6 mg/dL (ref 0.3–1.2)
Total Protein: 8.5 g/dL — ABNORMAL HIGH (ref 6.5–8.1)

## 2018-11-09 LAB — CBC WITH DIFFERENTIAL/PLATELET
Abs Immature Granulocytes: 0.07 10*3/uL (ref 0.00–0.07)
Basophils Absolute: 0 10*3/uL (ref 0.0–0.1)
Basophils Relative: 0 %
EOS ABS: 0.1 10*3/uL (ref 0.0–0.5)
Eosinophils Relative: 0 %
HCT: 33.9 % — ABNORMAL LOW (ref 36.0–46.0)
Hemoglobin: 10 g/dL — ABNORMAL LOW (ref 12.0–15.0)
Immature Granulocytes: 1 %
Lymphocytes Relative: 8 %
Lymphs Abs: 1.2 10*3/uL (ref 0.7–4.0)
MCH: 26.9 pg (ref 26.0–34.0)
MCHC: 29.5 g/dL — ABNORMAL LOW (ref 30.0–36.0)
MCV: 91.1 fL (ref 80.0–100.0)
MONO ABS: 0.8 10*3/uL (ref 0.1–1.0)
MONOS PCT: 6 %
Neutro Abs: 11.6 10*3/uL — ABNORMAL HIGH (ref 1.7–7.7)
Neutrophils Relative %: 85 %
PLATELETS: 227 10*3/uL (ref 150–400)
RBC: 3.72 MIL/uL — ABNORMAL LOW (ref 3.87–5.11)
RDW: 14.9 % (ref 11.5–15.5)
WBC: 13.7 10*3/uL — ABNORMAL HIGH (ref 4.0–10.5)
nRBC: 0 % (ref 0.0–0.2)

## 2018-11-09 LAB — BRAIN NATRIURETIC PEPTIDE: B NATRIURETIC PEPTIDE 5: 64.2 pg/mL (ref 0.0–100.0)

## 2018-11-09 LAB — PROTIME-INR
INR: 1.06
PROTHROMBIN TIME: 13.7 s (ref 11.4–15.2)

## 2018-11-09 LAB — LIPASE, BLOOD: LIPASE: 23 U/L (ref 11–51)

## 2018-11-09 LAB — LACTIC ACID, PLASMA: Lactic Acid, Venous: 1.1 mmol/L (ref 0.5–1.9)

## 2018-11-09 MED ORDER — VANCOMYCIN HCL IN DEXTROSE 1-5 GM/200ML-% IV SOLN: 1000.0000 mg | Freq: Once | INTRAVENOUS | Status: DC

## 2018-11-09 MED ORDER — CLONAZEPAM 0.5 MG PO TABS
0.5000 mg | ORAL_TABLET | Freq: Once | ORAL | Status: AC
  Administered 2018-11-09: 0.5 mg via ORAL
  Filled 2018-11-09: qty 1

## 2018-11-09 MED ORDER — SODIUM CHLORIDE 0.9 % IV SOLN
1000.0000 mL | INTRAVENOUS | Status: DC
  Administered 2018-11-09: 1000 mL via INTRAVENOUS

## 2018-11-09 MED ORDER — VANCOMYCIN HCL 10 G IV SOLR
1500.0000 mg | Freq: Two times a day (BID) | INTRAVENOUS | Status: DC
  Filled 2018-11-09: qty 1500

## 2018-11-09 MED ORDER — CEPHALEXIN 500 MG PO CAPS: 1000.0000 mg | ORAL_CAPSULE | Freq: Two times a day (BID) | ORAL | 0 refills | Status: DC

## 2018-11-09 MED ORDER — METRONIDAZOLE IN NACL 5-0.79 MG/ML-% IV SOLN
500.0000 mg | Freq: Three times a day (TID) | INTRAVENOUS | Status: DC
  Administered 2018-11-09: 500 mg via INTRAVENOUS
  Filled 2018-11-09: qty 100

## 2018-11-09 MED ORDER — SODIUM CHLORIDE 0.9 % IV SOLN: 2.0000 g | Freq: Two times a day (BID) | INTRAVENOUS | Status: DC

## 2018-11-09 MED ORDER — HYDROCODONE-ACETAMINOPHEN 5-325 MG PO TABS
1.0000 | ORAL_TABLET | Freq: Once | ORAL | Status: AC
  Administered 2018-11-09: 1 via ORAL
  Filled 2018-11-09: qty 1

## 2018-11-09 MED ORDER — SODIUM CHLORIDE 0.9 % IV SOLN
2.0000 g | Freq: Once | INTRAVENOUS | Status: AC
  Administered 2018-11-09: 2 g via INTRAVENOUS
  Filled 2018-11-09: qty 2

## 2018-11-09 MED ORDER — VANCOMYCIN HCL 10 G IV SOLR
2500.0000 mg | Freq: Once | INTRAVENOUS | Status: AC
  Administered 2018-11-09: 2500 mg via INTRAVENOUS
  Filled 2018-11-09: qty 2500

## 2018-11-09 NOTE — ED Triage Notes (Signed)
Pt BIB [facility] for a fever and altered mental status. [facility] advised that the pt does not use the vent during the day but when she became altered they but her on the vent. [facility] advised staff told them the pt would also not eat her fried chicken which is out of the norm for her. [facility] advised the pt was alert and oriented for them and able to answer their questions. [facility] advised her vital sign were stable as noted and they attempted an IV attempt but same was unsuccessful.

## 2018-11-09 NOTE — ED Provider Notes (Signed)
[facility] EMERGENCY DEPARTMENT Provider Note   CSN: [id number] Arrival date & time: 11/09/18  1832    History   Chief Complaint Chief Complaint  Patient presents with  . Fever  . Altered Mental Status    HPI Kim Jenkins is a 53 y.o. female.     HPI History is somewhat limited.  Patient is from [place].  She is transferred with chief complaint of fever and altered mental status.  Reportedly patient is on nighttime vent but not daytime.  Today she seemed confused and would not eat.  EMR indicates patient was recently started on Bactrim.  Patient is awake and alert in appearance.  She is mouthing out communications but it is not intelligible because she is on the vent and there is no audible sounds.  Patient is sometimes tearful.  She will not write down her answers.  She will take the board in the pen but then just becomes tearful and puts it back down again.  Anywhere I ask if she has pain, she will start to grimace, nod her head and become tearful. Past Medical History:  Diagnosis Date  . Bipolar 1 disorder ([place])   . Chronic pain   . Depression   . Diabetes mellitus ([place])   . Hypertension   . Kidney stones   . Morbid obesity ([place])   . Obesity hypoventilation syndrome ([place])   . Obstructive sleep apnea   . Panniculitis     Patient Active Problem List   Diagnosis Date Noted  . Hypoxemia   . Pressure injury of skin 05/06/2018  . Acute on chronic respiratory failure with hypoxemia ([place])   . Hypercarbia   . GI bleed 05/05/2018  . Hemorrhagic shock ([place]) 05/05/2018  . Acute blood loss anemia 05/05/2018  . Tracheostomy care ([place]) 05/05/2018  . OSA (obstructive sleep apnea) 05/05/2018  . DM2 (diabetes mellitus, type 2) ([place]) 05/05/2018  . Bipolar 1 disorder ([place]) 05/05/2018  . Chronic pain 05/05/2018  . Depression 05/05/2018  . Acute kidney injury ([place]) 05/05/2018  . Acute upper GI bleed   . Tracheostomy status ([place]) 02/17/2014  . Morbid obesity ([place])  02/17/2014  . Chronic respiratory failure with hypoxia and hypercapnia ([place]) 01/23/2014  . Acute encephalopathy 01/23/2014    Past Surgical History:  Procedure Laterality Date  . ESOPHAGOGASTRODUODENOSCOPY N/A 05/06/2018   Procedure: ESOPHAGOGASTRODUODENOSCOPY (EGD);  Surgeon: Irving Copas., MD;  Location: [facility];  Service: Gastroenterology;  Laterality: N/A;  . RIGHT OOPHORECTOMY    . TRACHEOSTOMY    . VENTRAL HERNIA REPAIR       OB History   No obstetric history on file.      Home Medications    Prior to Admission medications   Medication Sig Start Date End Date Taking? Authorizing Provider  ARIPiprazole (ABILIFY) 5 MG tablet Take 1 tablet (5 mg total) by mouth daily. 09/13/18  Yes Ward, Ozella Almond, PA-C  chlorhexidine (HIBICLENS) 4 % external liquid Apply 1 application topically daily. For bath   Yes [provider]  Cholecalciferol 1000 units tablet Take 1,000 Units by mouth daily.   Yes [provider]  clonazePAM (KLONOPIN) 0.5 MG tablet Take 0.5 mg by mouth every 8 (eight) hours. In addition pt. May take 0.5mg  every 12 hours as needed for anxiety.   Yes [provider]  clotrimazole-betamethasone (LOTRISONE) cream Apply 1 application topically 3 (three) times daily.   Yes [provider]  divalproex (DEPAKOTE ER) 500 MG 24 hr tablet Take  2 tablets (1,000 mg total) by mouth every 12 (twelve) hours. 09/13/18  Yes Ward, Ozella Almond, PA-C  ergocalciferol (VITAMIN D2) 50000 units capsule Take 50,000 Units by mouth once a week. On Saturday   Yes [provider]  Exenatide ER (BYDUREON) 2 MG PEN Inject 2 mg into the skin every Thursday.   Yes [provider]  fluticasone (FLONASE) 50 MCG/ACT nasal spray Place 2 sprays into both nostrils daily.   Yes [provider]  HYDROcodone-acetaminophen (NORCO/VICODIN) 5-325 MG tablet Take 1 tablet by mouth every 6 (six) hours as needed for moderate pain.   Yes  [provider]  insulin glargine (LANTUS) 100 UNIT/ML injection Inject 10-40 Units into the skin See admin instructions. Inject 10 units every morning and 40 units every night.   Yes [provider]  ipratropium-albuterol (DUONEB) 0.5-2.5 (3) MG/3ML SOLN Take 3 mLs by nebulization every 4 (four) hours as needed (SOB/wheezing).   Yes [provider]  lidocaine (XYLOCAINE) 2 % jelly 1 application by Tracheal Tube route every 4 (four) hours as needed (cough).   Yes [provider]  loratadine (CLARITIN) 10 MG tablet Take 10 mg by mouth daily.    Yes [provider]  metoprolol tartrate (LOPRESSOR) 50 MG tablet Take 50 mg by mouth 2 (two) times daily.   Yes [provider]  Multiple Vitamin (MULTIVITAMIN WITH MINERALS) TABS tablet Take 1 tablet by mouth daily.   Yes [provider]  nystatin cream (MYCOSTATIN) Apply 1 application topically 3 (three) times daily. Apply to abdominal folds   Yes [provider]  pantoprazole (PROTONIX) 40 MG tablet Take 40 mg by mouth daily.    Yes [provider]  Propylene Glycol (SYSTANE BALANCE) 0.6 % SOLN Place 1 drop into both eyes every 6 (six) hours as needed (dry eyes).   Yes [provider]  Skin Protectants, Misc. (CRITIC-AID EX) Apply 1 application topically daily as needed.   Yes [provider]  sodium chloride (OCEAN) 0.65 % SOLN nasal spray Place 2 sprays into both nostrils every 2 (two) hours as needed for congestion.   Yes [provider]  sulfamethoxazole-trimethoprim (BACTRIM,SEPTRA) 400-80 MG tablet Take 1 tablet by mouth daily. For 7 days   Yes [provider]  torsemide (DEMADEX) 20 MG tablet Take 20 mg by mouth daily.   Yes [provider]  traZODone (DESYREL) 150 MG tablet Take 1 tablet (150 mg total) by mouth at bedtime. 09/13/18  Yes Ward, Ozella Almond, PA-C  venlafaxine XR (EFFEXOR-XR) 75 MG 24 hr capsule Take 225 mg by  mouth daily with breakfast.   Yes [provider]  acetaminophen (TYLENOL) 325 MG tablet Take 650 mg by mouth every 6 (six) hours as needed for mild pain, moderate pain, fever or headache.    [provider]  cephALEXin (KEFLEX) 500 MG capsule Take 2 capsules (1,000 mg total) by mouth 2 (two) times daily. 11/09/18   Charlesetta Shanks, MD  famotidine (PEPCID) 20 MG tablet Take 20 mg by mouth at bedtime.    [provider]  guaifenesin (ROBITUSSIN) 100 MG/5ML syrup Take 100 mg by mouth every 6 (six) hours as needed for cough.    [provider]  olopatadine (PATANOL) 0.1 % ophthalmic solution Place 1 drop into both eyes 2 (two) times daily.    [provider]  ondansetron (ZOFRAN) 4 MG tablet Take 4 mg by mouth every 6 (six) hours as needed for nausea or vomiting.  [provider]  Skin Protectants, Misc. (EUCERIN) cream Apply 1 application topically daily. Apply to bilateral lower extremity areas    [provider]  talc (ZEASORB) powder Apply 1 application topically every 12 (twelve) hours. cleanse bilateral buttocks and ischials with normal saline, dry, and apply powder    [provider]  traMADol (ULTRAM) 50 MG tablet Take 50 mg by mouth every 8 (eight) hours as needed for moderate pain or severe pain.    [provider]    Family History No family history on file.  Social History Social History   Tobacco Use  . Smoking status: Never Smoker  . Smokeless tobacco: Never Used  Substance Use Topics  . Alcohol use: No    Frequency: Never  . Drug use: No     Allergies   Meperidine and related and Cefazolin   Review of Systems Review of Systems Level 5 caveat cannot obtain review of systems patient is on ventilator.  Physical Exam Updated Vital Signs BP (!) 112/98   Pulse 90   Temp (!) 101.6 F (38.7 C) (Rectal)   Resp 17   Ht 5\' 3"  (1.6 m)   Wt (!) 205.5 kg   LMP  (Exact Date)   SpO2 93%   BMI  80.25 kg/m   Physical Exam Constitutional:      Comments: Patient is alert.  She has good eye contact.  She currently has the ventilator in place via her tracheostomy.  No signs of respiratory distress.  HENT:     Head: Normocephalic and atraumatic.     Nose: Nose normal.     Mouth/Throat:     Pharynx: Oropharynx is clear.  Eyes:     Extraocular Movements: Extraocular movements intact.  Cardiovascular:     Rate and Rhythm: Normal rate and regular rhythm.  Pulmonary:     Effort: Pulmonary effort is normal.     Breath sounds: Normal breath sounds.     Comments: Patient does not appear to be in respiratory distress.  She is on the ventilator.  Can only auscultate anteriorly at this time due to patient size and inability to be moved without several individuals. Abdominal:     Comments: Abdomen is soft and nontender.  Morbid obesity.  No significant Candida or maceration in skin folds.  Musculoskeletal:     Comments: 2+ pitting edema bilaterally.  Patient does have patchy diffuse erythema of both legs.  See attached images.  Skin:    General: Skin is warm and dry.  Neurological:     Comments: Patient is alert.  She has very limited movement which appears to be secondary to body habitus.          ED Treatments / Results  Labs (all labs ordered are listed, but only abnormal results are displayed) Labs Reviewed  COMPREHENSIVE METABOLIC PANEL - Abnormal; Notable for the following components:      Result Value   Chloride 90 (*)    CO2 34 (*)    Glucose, Bld 163 (*)    BUN 25 (*)    Creatinine, Ser 1.11 (*)    Total Protein 8.5 (*)    Albumin 2.7 (*)    GFR calc non Af Amer 56 (*)    All other components within normal limits  TROPONIN I - Abnormal; Notable for the following components:   Troponin I 0.04 (*)    All other components within normal limits  CBC WITH DIFFERENTIAL/PLATELET - Abnormal; Notable for the  following components:   WBC 13.7 (*)    RBC 3.72 (*)     Hemoglobin 10.0 (*)    HCT 33.9 (*)    MCHC 29.5 (*)    Neutro Abs 11.6 (*)    All other components within normal limits  URINE CULTURE  CULTURE, BLOOD (ROUTINE X 2)  CULTURE, BLOOD (ROUTINE X 2)  LIPASE, BLOOD  BRAIN NATRIURETIC PEPTIDE  LACTIC ACID, PLASMA  PROTIME-INR  LACTIC ACID, PLASMA  URINALYSIS, ROUTINE W REFLEX MICROSCOPIC  INFLUENZA PANEL BY PCR (TYPE A & B)    EKG EKG Interpretation  Date/Time:  Saturday November 09 2018 18:33:10 EST Ventricular Rate:  103 PR Interval:    QRS Duration: 66 QT Interval:  327 QTC Calculation: 428 R Axis:   77 Text Interpretation:  Sinus tachycardia artifact otherwise no change Confirmed by Charlesetta Shanks [phone]) on 11/10/2018 12:20:05 AM   Radiology Dg Chest Port 1 View  Result Date: 11/09/2018 CLINICAL DATA:  Fever, altered mental status EXAM: PORTABLE CHEST 1 VIEW COMPARISON:  09/13/2018 FINDINGS: Tracheostomy is unchanged. Cardiomegaly. No overt edema. No confluent opacities or effusions. No acute bony abnormality. IMPRESSION: Cardiomegaly.  No active disease. Electronically Signed   By: Rolm Baptise M.D.   On: 11/09/2018 20:19    Procedures Procedures (including critical care time)  Medications Ordered in ED Medications  0.9 %  sodium chloride infusion (1,000 mLs Intravenous New Bag/Given 11/09/18 2011)  metroNIDAZOLE (FLAGYL) IVPB 500 mg (0 mg Intravenous Stopped 11/09/18 2122)  vancomycin (VANCOCIN) 1,500 mg in sodium chloride 0.9 % 500 mL IVPB (has no administration in time range)  ceFEPIme (MAXIPIME) 2 g in sodium chloride 0.9 % 100 mL IVPB (has no administration in time range)  ceFEPIme (MAXIPIME) 2 g in sodium chloride 0.9 % 100 mL IVPB (0 g Intravenous Stopped 11/09/18 2120)  vancomycin (VANCOCIN) 2,500 mg in sodium chloride 0.9 % 500 mL IVPB (0 mg Intravenous Stopped 11/09/18 2352)  HYDROcodone-acetaminophen (NORCO/VICODIN) 5-325 MG per tablet 1 tablet (1 tablet Oral Given 11/09/18 2348)  clonazePAM (KLONOPIN) tablet  0.5 mg (0.5 mg Oral Given 11/09/18 2348)     Initial Impression / Assessment and Plan / ED Course  I have reviewed the triage vital signs and the nursing notes.  Pertinent labs & imaging results that were available during my care of the patient were reviewed by me and considered in my medical decision making (see chart for details).       Patient is sent from [facility] with mental status change today and not eating as per usual.  Patient is alert and interactive in the emergency department.  Her respiratory status appears stable.  She is a patient with trach in nighttime ventilator use.  Patient has fever to 101.6.  She does not have lactic acidosis.  Blood pressures are stable.  Findings are suggestive of cellulitis as possible etiology.  Patient has diffuse erythema on the left lower extremity.  No suggestion that this is a DVT.  This is in a distinct pattern on the lateral leg.  At this time, patient is stable for continued management at [facility] with antibiotic treatment and physician consultation.  Patient has been given first dose of IV antibiotics in the emergency department and blood cultures obtained.  Final Clinical Impressions(s) / ED Diagnoses   Final diagnoses:  Fever, unspecified fever cause  Cellulitis of lower extremity, unspecified laterality    ED Discharge Orders         Ordered    cephALEXin (  KEFLEX) 500 MG capsule  2 times daily     11/09/18 2356           Charlesetta Shanks, MD 11/10/18 [phone]

## 2018-11-09 NOTE — Progress Notes (Signed)
Patient on vent. Placed on settings per EMS that patient is on at [place]. There is a extra 6.0 shiley XLT at bedside that was brought by EMS that was from patient's room in [place]. No noted respiratory distress at this time.

## 2018-11-09 NOTE — Progress Notes (Signed)
Pharmacy Antibiotic Note  Kim Jenkins is a 53 y.o. female admitted on 11/09/2018 with sepsis.  Pharmacy has been consulted for vancomycin and cefepime dosing. Pt has fever and is altered, lactate normal.  Plan: Cefepime 2g IV x1 then 2g IV q12h Vancomycin 2500mg  IV x1 then 1500mg  IV q12h Monitor renal function, LOT, cultures Vancomycin levels as indicated   Height: 5\' 3"  (160 cm) Weight: (!) 453 lb (205.5 kg) IBW/kg (Calculated) : 52.4  Temp (24hrs), Avg:101.6 F (38.7 C), Min:101.6 F (38.7 C), Max:101.6 F (38.7 C)  Recent Labs  Lab 11/09/18 1912  WBC 13.7*    CrCl cannot be calculated (Patient's most recent lab result is older than the maximum 21 days allowed.).    Allergies  Allergen Reactions  . Meperidine And Related Shortness Of Breath  . Cefazolin Other (See Comments)    Per MAR    Antimicrobials this admission: Vancomycin 2/22 >>  Cefepime 2/22 >>   Dose adjustments this admission: none  Microbiology result: sent  Thank you for allowing pharmacy to be a part of this patient's care.  Arrie Senate, PharmD, BCPS Clinical Pharmacist Please check AMION for all [place] numbers 11/09/2018

## 2018-11-10 DIAGNOSIS — L03116 Cellulitis of left lower limb: Secondary | ICD-10-CM | POA: Diagnosis not present

## 2018-11-10 NOTE — ED Notes (Signed)
Attempted to call report to [facility], no answer.

## 2018-11-10 NOTE — Discharge Instructions (Signed)
1.  Patient's lab work has mild elevation in white count but normal lactic acid.  Patient has fever to 101.  Blood pressure is stable.  Physical findings show possible cellulitis of the lower extremities.  No evident pneumonia on chest x-ray. 2.  Patient has been given broad-spectrum antibiotics, Maxipime, Flagyl and vancomycin in the emergency department.  Blood cultures are pending. 3.  Continue patient on Keflex 1000 mg twice daily and Bactrim twice daily. 4.  Have consultation with facility physician to reevaluate the patient and determine if further diagnostic testing is needed and follow-up on cultures.

## 2018-11-10 NOTE — ED Notes (Signed)
NT found that pt had removed both her IVs.  Pt crying stating she wanted to go home.  Spoke to Provider and informed them that pt only had 1/2 of the Vanc.  Provider discontinued rest of Vanc.

## 2018-11-14 LAB — CULTURE, BLOOD (ROUTINE X 2)
CULTURE: NO GROWTH
Culture: NO GROWTH
Special Requests: ADEQUATE
Special Requests: ADEQUATE

## 2019-01-17 ENCOUNTER — Emergency Department (HOSPITAL_COMMUNITY): Payer: Medicare Other

## 2019-01-17 ENCOUNTER — Emergency Department (HOSPITAL_COMMUNITY)
Admission: EM | Admit: 2019-01-17 | Discharge: 2019-01-17 | Disposition: A | Discharge status: Home / Self Care | Payer: Medicare Other | Attending: Emergency Medicine | Admitting: Emergency Medicine

## 2019-01-17 ENCOUNTER — Other Ambulatory Visit: Payer: Self-pay

## 2019-01-17 DIAGNOSIS — Z20828 Contact with and (suspected) exposure to other viral communicable diseases: Secondary | ICD-10-CM | POA: Diagnosis not present

## 2019-01-17 DIAGNOSIS — G8929 Other chronic pain: Secondary | ICD-10-CM | POA: Diagnosis not present

## 2019-01-17 DIAGNOSIS — J069 Acute upper respiratory infection, unspecified: Secondary | ICD-10-CM | POA: Diagnosis not present

## 2019-01-17 DIAGNOSIS — M25512 Pain in left shoulder: Secondary | ICD-10-CM | POA: Diagnosis not present

## 2019-01-17 LAB — CBC WITH DIFFERENTIAL/PLATELET
Abs Immature Granulocytes: 0.02 10*3/uL (ref 0.00–0.07)
Basophils Absolute: 0 10*3/uL (ref 0.0–0.1)
Basophils Relative: 0 %
Eosinophils Absolute: 0.1 10*3/uL (ref 0.0–0.5)
Eosinophils Relative: 2 %
HCT: 36.7 % (ref 36.0–46.0)
Hemoglobin: 11.2 g/dL — ABNORMAL LOW (ref 12.0–15.0)
Immature Granulocytes: 0 %
Lymphocytes Relative: 35 %
Lymphs Abs: 2.2 10*3/uL (ref 0.7–4.0)
MCH: 27.3 pg (ref 26.0–34.0)
MCHC: 30.5 g/dL (ref 30.0–36.0)
MCV: 89.3 fL (ref 80.0–100.0)
Monocytes Absolute: 0.7 10*3/uL (ref 0.1–1.0)
Monocytes Relative: 11 %
Neutro Abs: 3.3 10*3/uL (ref 1.7–7.7)
Neutrophils Relative %: 52 %
Platelets: 129 10*3/uL — ABNORMAL LOW (ref 150–400)
RBC: 4.11 MIL/uL (ref 3.87–5.11)
RDW: 14.9 % (ref 11.5–15.5)
WBC: 6.4 10*3/uL (ref 4.0–10.5)
nRBC: 0 % (ref 0.0–0.2)

## 2019-01-17 LAB — BASIC METABOLIC PANEL
Anion gap: 7 (ref 5–15)
BUN: 18 mg/dL (ref 6–20)
CO2: 39 mmol/L — ABNORMAL HIGH (ref 22–32)
Calcium: 9.4 mg/dL (ref 8.9–10.3)
Chloride: 92 mmol/L — ABNORMAL LOW (ref 98–111)
Creatinine, Ser: 0.87 mg/dL (ref 0.44–1.00)
GFR calc Af Amer: >60 mL/min (ref >60)
GFR calc non Af Amer: >60 mL/min (ref >60)
Glucose, Bld: 105 mg/dL — ABNORMAL HIGH (ref 70–99)
Potassium: 4.3 mmol/L (ref 3.5–5.1)
Sodium: 138 mmol/L (ref 135–145)

## 2019-01-17 LAB — SARS CORONAVIRUS 2 BY RT PCR (HOSPITAL ORDER, PERFORMED IN ~~LOC~~ HOSPITAL LAB): SARS Coronavirus 2: NEGATIVE

## 2019-01-17 MED ORDER — HYDROCODONE-ACETAMINOPHEN 5-325 MG PO TABS: 1.0000 | ORAL_TABLET | Freq: Four times a day (QID) | ORAL | 0 refills | Status: DC | PRN

## 2019-01-17 MED ORDER — OXYCODONE-ACETAMINOPHEN 5-325 MG PO TABS
1.0000 | ORAL_TABLET | Freq: Once | ORAL | Status: AC
  Administered 2019-01-17: 15:00:00 1 via ORAL
  Filled 2019-01-17: qty 1

## 2019-01-17 NOTE — ED Provider Notes (Signed)
Care assumed from [facility], PA-C, at shift change, please see their notes for full documentation of patient's complaint/HPI. Briefly, pt here with L shoulder pain which is chronic but worse over last several weeks, also with sore throat x1 day. 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 Awaiting transport back to [facility]. Plan is to transport by [facility].   Physical Exam  BP 119/90   Pulse 98   Temp 98.6 F (37 C) (Oral)   Resp 16   SpO2 97%   Physical Exam Trach present  ED Course/Procedures     Results for orders placed or performed during the hospital encounter of 01/17/19  SARS Coronavirus 2 [facility] order, Performed in [facility] hospital lab)  Result Value Ref Range   SARS Coronavirus 2 NEGATIVE NEGATIVE  Basic metabolic panel  Result Value Ref Range   Sodium 138 135 - 145 mmol/L   Potassium 4.3 3.5 - 5.1 mmol/L   Chloride 92 (L) 98 - 111 mmol/L   CO2 39 (H) 22 - 32 mmol/L   Glucose, Bld 105 (H) 70 - 99 mg/dL   BUN 18 6 - 20 mg/dL   Creatinine, Ser 0.87 0.44 - 1.00 mg/dL   Calcium 9.4 8.9 - 10.3 mg/dL   GFR calc non Af Amer >60 >60 mL/min   GFR calc Af Amer >60 >60 mL/min   Anion gap 7 5 - 15  CBC with Differential  Result Value Ref Range   WBC 6.4 4.0 - 10.5 K/uL   RBC 4.11 3.87 - 5.11 MIL/uL   Hemoglobin 11.2 (L) 12.0 - 15.0 g/dL   HCT 36.7 36.0 - 46.0 %   MCV 89.3 80.0 - 100.0 fL   MCH 27.3 26.0 - 34.0 pg   MCHC 30.5 30.0 - 36.0 g/dL   RDW 14.9 11.5 - 15.5 %   Platelets 129 (L) 150 - 400 K/uL   nRBC 0.0 0.0 - 0.2 %   Neutrophils Relative % 52 %   Neutro Abs 3.3 1.7 - 7.7 K/uL   Lymphocytes Relative 35 %   Lymphs Abs 2.2 0.7 - 4.0 K/uL   Monocytes Relative 11 %   Monocytes Absolute 0.7 0.1 - 1.0 K/uL   Eosinophils Relative 2 %   Eosinophils Absolute 0.1 0.0 - 0.5 K/uL   Basophils Relative 0 %   Basophils Absolute  0.0 0.0 - 0.1 K/uL   Immature Granulocytes 0 %   Abs Immature Granulocytes 0.02 0.00 - 0.07 K/uL   Dg Chest 2 View  Result Date: 01/17/2019 CLINICAL DATA:  Upper respiratory infection. EXAM: CHEST - 2 VIEW COMPARISON:  11/09/2018 and 09/13/2018 FINDINGS: Tracheostomy tube appears in good position, unchanged. Heart size and pulmonary vascularity are normal. On the lateral view there is increased density posteriorly at 1 of the lung bases, probably atelectasis. There is a similar appearance on the prior study of 09/13/2018. No discrete effusions. No acute bone abnormality. IMPRESSION: Probable atelectasis posteriorly at 1 of the lung bases. Otherwise, no acute abnormalities. Electronically Signed   By: Lorriane Shire M.D.   On: 01/17/2019 12:59   Dg Shoulder Left  Result Date: 01/17/2019 CLINICAL DATA:  Left shoulder pain. EXAM: LEFT SHOULDER - 2+ VIEW COMPARISON:  None. FINDINGS: Delineation of bony detail is very limited due to body habitus and difficulty in penetrating soft tissues. No gross fracture or dislocation is identified. There  is some degenerative disease at the level of the [facility] joint and glenohumeral joint. No bony destruction identified. IMPRESSION: Limited evaluation due to body habitus. No gross fracture or dislocation identified. Degenerative disease present at the level of the [facility] joint and glenohumeral joint. Electronically Signed   By: Aletta Edouard M.D.   On: 01/17/2019 14:28     Meds ordered this encounter  Medications  . oxyCODONE-acetaminophen (PERCOCET/ROXICET) 5-325 MG per tablet 1 tablet  . HYDROcodone-acetaminophen (NORCO/VICODIN) 5-325 MG tablet    Sig: Take 1 tablet by mouth every 6 (six) hours as needed for moderate pain.    Dispense:  15 tablet    Refill:  0     MDM:   ICD-10-CM   1. Chronic left shoulder pain M25.512    G89.29   2. Upper respiratory tract infection, unspecified type J06.9     5:40 PM Pt was seen by Irena Cords PA-C earlier today. 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 [facility], [facility] was going to transport her. Pt well appearing in NAD, stable for [facility]. Plan is to f/up with her provider that she's seeing for her L shoulder (pt receiving PT there), use prescribed pain meds that were written by Irena Cords PA-C, and f/up with [facility] provider. Pt stable for discharge.    [address] [place], [place] 01/17/19 [place], MD 01/17/19 2152

## 2019-01-17 NOTE — ED Notes (Signed)
Patient transported to X-ray 

## 2019-01-17 NOTE — ED Notes (Signed)
Patient verbalizes understanding of discharge instructions. Opportunity for questioning and answers were provided. Armband removed by staff, pt discharged from ED via stretcher with [facility] to [facility].

## 2019-01-17 NOTE — ED Notes (Signed)
Called [facility] for [facility] transport [facility].

## 2019-01-17 NOTE — Discharge Instructions (Signed)
Return here as needed. Follow up with your doctor. °

## 2019-01-17 NOTE — ED Notes (Signed)
[facility] has arrived  

## 2019-01-17 NOTE — ED Triage Notes (Signed)
Pt here for L shoulder pain x 1 year, worsening with recent restart of PT. Sore throat since yesterday, and cough since 2008. [facility] staff reports low bp, 99/59, normotensive with EMS.

## 2019-01-18 NOTE — ED Provider Notes (Signed)
[facility] EMERGENCY DEPARTMENT Provider Note   CSN: [id number] Arrival date & time: 01/17/19  1055    History   Chief Complaint Chief Complaint  Patient presents with  . Shoulder Pain  . Sore Throat    HPI Kim Jenkins is a 53 y.o. female.     HPI Patient presents to the emergency department with left shoulder pain that started 3 weeks ago but is more chronic over the last year.  The patient is a trach patient states that she has had some sore throat with body aches over the last 2 days.  Patient states that nothing seems to make the condition better but certain movements palpation make the shoulder worse.  Patient states her main complaint is shoulder pain at this point.  She states he has been doing rehab over the last 3 weeks which made the pain worse.  The patient denies chest pain, shortness of breath, headache,blurred vision, neck pain, fever,  weakness, numbness, dizziness, anorexia, edema, abdominal pain, nausea, vomiting, diarrhea, rash, back pain, dysuria, hematemesis, bloody stool, near syncope, or syncope. Past Medical History:  Diagnosis Date  . Bipolar 1 disorder ([place])   . Chronic pain   . Depression   . Diabetes mellitus ([place])   . Hypertension   . Kidney stones   . Morbid obesity ([place])   . Obesity hypoventilation syndrome ([place])   . Obstructive sleep apnea   . Panniculitis     Patient Active Problem List   Diagnosis Date Noted  . Hypoxemia   . Pressure injury of skin 05/06/2018  . Acute on chronic respiratory failure with hypoxemia ([place])   . Hypercarbia   . GI bleed 05/05/2018  . Hemorrhagic shock ([place]) 05/05/2018  . Acute blood loss anemia 05/05/2018  . Tracheostomy care ([place]) 05/05/2018  . OSA (obstructive sleep apnea) 05/05/2018  . DM2 (diabetes mellitus, type 2) ([place]) 05/05/2018  . Bipolar 1 disorder ([place]) 05/05/2018  . Chronic pain 05/05/2018  . Depression 05/05/2018  . Acute kidney injury ([place]) 05/05/2018  . Acute upper GI bleed    . Tracheostomy status ([place]) 02/17/2014  . Morbid obesity ([place]) 02/17/2014  . Chronic respiratory failure with hypoxia and hypercapnia ([place]) 01/23/2014  . Acute encephalopathy 01/23/2014    Past Surgical History:  Procedure Laterality Date  . ESOPHAGOGASTRODUODENOSCOPY N/A 05/06/2018   Procedure: ESOPHAGOGASTRODUODENOSCOPY (EGD);  Surgeon: Irving Copas., MD;  Location: [facility];  Service: Gastroenterology;  Laterality: N/A;  . RIGHT OOPHORECTOMY    . TRACHEOSTOMY    . VENTRAL HERNIA REPAIR       OB History   No obstetric history on file.      Home Medications    Prior to Admission medications   Medication Sig Start Date End Date Taking? Authorizing Provider  acetaminophen (TYLENOL) 325 MG tablet Take 650 mg by mouth every 6 (six) hours as needed for mild pain, moderate pain, fever or headache.   Yes [provider]  ARIPiprazole (ABILIFY) 5 MG tablet Take 1 tablet (5 mg total) by mouth daily. 09/13/18  Yes Ward, Ozella Almond, PA-C  chlorhexidine (HIBICLENS) 4 % external liquid Apply 1 application topically daily. For bath   Yes [provider]  Cholecalciferol 1000 units tablet Take 1,000 Units by mouth daily.   Yes [provider]  clotrimazole-betamethasone (LOTRISONE) cream Apply 1 application topically 3 (three) times daily.   Yes [provider]  diazepam (VALIUM) 5 MG tablet Take 5 mg by mouth as needed for anxiety  or muscle spasms.   Yes [provider]  divalproex (DEPAKOTE ER) 500 MG 24 hr tablet Take 2 tablets (1,000 mg total) by mouth every 12 (twelve) hours. 09/13/18  Yes Ward, Ozella Almond, PA-C  ergocalciferol (VITAMIN D2) 50000 units capsule Take 50,000 Units by mouth once a week.    Yes [provider]  Exenatide ER (BYDUREON) 2 MG PEN Inject 2 mg into the skin every Thursday.   Yes [provider]  famotidine (PEPCID) 20 MG tablet Take 20 mg by mouth at bedtime.   Yes [provider]  fluticasone (FLONASE) 50 MCG/ACT nasal spray Place 2 sprays into both nostrils daily.   Yes [provider]  guaifenesin (ROBITUSSIN) 100 MG/5ML syrup Take 100 mg by mouth every 6 (six) hours as needed for cough.   Yes [provider]  insulin glargine (LANTUS) 100 UNIT/ML injection Inject 10-40 Units into the skin See admin instructions. Inject 10 units in the am and inject 40 at bedtime.   Yes [provider]  ipratropium-albuterol (DUONEB) 0.5-2.5 (3) MG/3ML SOLN Take 3 mLs by nebulization every 4 (four) hours as needed (SOB/wheezing).   Yes [provider]  lidocaine (XYLOCAINE) 2 % jelly 1 application by Tracheal Tube route every 4 (four) hours as needed (cough).   Yes [provider]  loperamide (IMODIUM) 2 MG capsule Take 2 mg by mouth as needed for diarrhea or loose stools.   Yes [provider]  loratadine (CLARITIN) 10 MG tablet Take 10 mg by mouth daily.    Yes [provider]  metoprolol tartrate (LOPRESSOR) 50 MG tablet Take 50 mg by mouth 2 (two) times daily.   Yes [provider]  miconazole (MICOTIN) 2 % powder Apply 1 application topically as needed for itching.   Yes [provider]  Multiple Vitamin (MULTIVITAMIN WITH MINERALS) TABS tablet Take 1 tablet by mouth daily.   Yes [provider]  olopatadine (PATANOL) 0.1 % ophthalmic solution Place 1 drop into both eyes 2 (two) times daily.   Yes [provider]  ondansetron (ZOFRAN) 4 MG tablet Take 4 mg by mouth every 6 (six) hours as needed for nausea or vomiting.   Yes [provider]  pantoprazole (PROTONIX) 40 MG tablet Take 40 mg by mouth daily.    Yes [provider]  Propylene Glycol (SYSTANE BALANCE) 0.6 % SOLN Place 1 drop into both eyes every 6 (six) hours as needed (dry eyes).   Yes [provider]  sodium chloride (OCEAN) 0.65 % SOLN nasal spray Place 2 sprays into both nostrils  every 2 (two) hours as needed for congestion.   Yes [provider]  talc (ZEASORB) powder Apply 1 application topically every 12 (twelve) hours. cleanse bilateral buttocks and ischials with normal saline, dry, and apply powder   Yes [provider]  torsemide (DEMADEX) 20 MG tablet Take 20 mg by mouth daily.   Yes [provider]  traZODone (DESYREL) 150 MG tablet Take 1 tablet (150 mg total) by mouth at bedtime. 09/13/18  Yes Ward, Ozella Almond, PA-C  venlafaxine XR (EFFEXOR-XR) 75 MG 24 hr capsule Take 225 mg by mouth daily with breakfast.   Yes [provider]  HYDROcodone-acetaminophen (NORCO/VICODIN) 5-325 MG tablet Take 1 tablet by mouth every 6 (six) hours as needed for moderate pain. 01/17/19   Adda Stokes, Harrell Gave, PA-C    Family History No family history on file.  Social History Social History   Tobacco Use  .  Smoking status: Never Smoker  . Smokeless tobacco: Never Used  Substance Use Topics  . Alcohol use: No    Frequency: Never  . Drug use: No     Allergies   Meperidine and related and Cefazolin   Review of Systems Review of Systems  All other systems negative except as documented in the HPI. All pertinent positives and negatives as reviewed in the HPI. Physical Exam Updated Vital Signs BP 119/90   Pulse 98   Temp 98.6 F (37 C) (Oral)   Resp 16   SpO2 97%   Physical Exam Vitals signs and nursing note reviewed.  Constitutional:      General: She is not in acute distress.    Appearance: She is well-developed. She is not ill-appearing.  HENT:     Head: Normocephalic and atraumatic.     Nose: No congestion or rhinorrhea.     Mouth/Throat:     Mouth: Mucous membranes are moist.     Pharynx: Uvula midline. No pharyngeal swelling, oropharyngeal exudate, posterior oropharyngeal erythema or uvula swelling.  Eyes:     Pupils: Pupils are equal, round, and reactive to light.  Neck:     Musculoskeletal: Normal range of motion  and neck supple.  Cardiovascular:     Rate and Rhythm: Normal rate and regular rhythm.     Heart sounds: Normal heart sounds. No murmur. No friction rub. No gallop.   Pulmonary:     Effort: Pulmonary effort is normal. No respiratory distress.     Breath sounds: Normal breath sounds. No wheezing.  Abdominal:     General: Bowel sounds are normal. There is no distension.     Palpations: Abdomen is soft.     Tenderness: There is no abdominal tenderness.  Musculoskeletal:     Left shoulder: She exhibits tenderness and pain. She exhibits no swelling, no deformity, no laceration, no spasm and normal pulse.  Skin:    General: Skin is warm and dry.     Capillary Refill: Capillary refill takes less than 2 seconds.     Findings: No erythema or rash.  Neurological:     Mental Status: She is alert and oriented to person, place, and time.     Motor: No abnormal muscle tone.     Coordination: Coordination normal.  Psychiatric:        Behavior: Behavior normal.      ED Treatments / Results  Labs (all labs ordered are listed, but only abnormal results are displayed) Labs Reviewed  BASIC METABOLIC PANEL - Abnormal; Notable for the following components:      Result Value   Chloride 92 (*)    CO2 39 (*)    Glucose, Bld 105 (*)    All other components within normal limits  CBC WITH DIFFERENTIAL/PLATELET - Abnormal; Notable for the following components:   Hemoglobin 11.2 (*)    Platelets 129 (*)    All other components within normal limits  SARS CORONAVIRUS 2 (HOSPITAL ORDER, [facility])    EKG None  Radiology Dg Chest 2 View  Result Date: 01/17/2019 CLINICAL DATA:  Upper respiratory infection. EXAM: CHEST - 2 VIEW COMPARISON:  11/09/2018 and 09/13/2018 FINDINGS: Tracheostomy tube appears in good position, unchanged. Heart size and pulmonary vascularity are normal. On the lateral view there is increased density posteriorly at 1 of the lung bases, probably  atelectasis. There is a similar appearance on the prior study of 09/13/2018. No discrete effusions. No acute bone  abnormality. IMPRESSION: Probable atelectasis posteriorly at 1 of the lung bases. Otherwise, no acute abnormalities. Electronically Signed   By: Lorriane Shire M.D.   On: 01/17/2019 12:59   Dg Shoulder Left  Result Date: 01/17/2019 CLINICAL DATA:  Left shoulder pain. EXAM: LEFT SHOULDER - 2+ VIEW COMPARISON:  None. FINDINGS: Delineation of bony detail is very limited due to body habitus and difficulty in penetrating soft tissues. No gross fracture or dislocation is identified. There is some degenerative disease at the level of the [facility] joint and glenohumeral joint. No bony destruction identified. IMPRESSION: Limited evaluation due to body habitus. No gross fracture or dislocation identified. Degenerative disease present at the level of the [facility] joint and glenohumeral joint. Electronically Signed   By: Aletta Edouard M.D.   On: 01/17/2019 14:28    Procedures Procedures (including critical care time)  Medications Ordered in ED Medications  oxyCODONE-acetaminophen (PERCOCET/ROXICET) 5-325 MG per tablet 1 tablet (1 tablet Oral Given 01/17/19 1527)     Initial Impression / Assessment and Plan / ED Course  I have reviewed the triage vital signs and the nursing notes.  Pertinent labs & imaging results that were available during my care of the patient were reviewed by me and considered in my medical decision making (see chart for details).        Patient will be discharged back to her facility with instructions to follow-up with orthopedics who is seen her for the shoulder in the past.  I have advised them to return here as needed.  Patient is advised to use ice and heat on the shoulder.  Final Clinical Impressions(s) / ED Diagnoses   Final diagnoses:  Chronic left shoulder pain  Upper respiratory tract infection, unspecified type    ED Discharge Orders         Ordered     HYDROcodone-acetaminophen (NORCO/VICODIN) 5-325 MG tablet  Every 6 hours PRN     01/17/19 1547           Dalia Heading, PA-C 01/19/19 1511    Jola Schmidt, MD 01/23/19 1615

## 2019-05-12 ENCOUNTER — Emergency Department (HOSPITAL_COMMUNITY): Payer: Medicare Other

## 2019-05-12 ENCOUNTER — Emergency Department (HOSPITAL_COMMUNITY)
Admission: EM | Admit: 2019-05-12 | Discharge: 2019-05-13 | Disposition: A | Discharge status: Home / Self Care | Payer: Medicare Other | Attending: Emergency Medicine | Admitting: Emergency Medicine

## 2019-05-12 ENCOUNTER — Encounter (HOSPITAL_COMMUNITY): Payer: Self-pay

## 2019-05-12 ENCOUNTER — Other Ambulatory Visit: Payer: Self-pay

## 2019-05-12 DIAGNOSIS — I10 Essential (primary) hypertension: Secondary | ICD-10-CM | POA: Insufficient documentation

## 2019-05-12 DIAGNOSIS — Z79899 Other long term (current) drug therapy: Secondary | ICD-10-CM | POA: Insufficient documentation

## 2019-05-12 DIAGNOSIS — E119 Type 2 diabetes mellitus without complications: Secondary | ICD-10-CM | POA: Diagnosis not present

## 2019-05-12 DIAGNOSIS — Z794 Long term (current) use of insulin: Secondary | ICD-10-CM | POA: Diagnosis not present

## 2019-05-12 DIAGNOSIS — R1012 Left upper quadrant pain: Secondary | ICD-10-CM | POA: Insufficient documentation

## 2019-05-12 LAB — URINALYSIS, ROUTINE W REFLEX MICROSCOPIC
Bilirubin Urine: NEGATIVE
Glucose, UA: NEGATIVE mg/dL
Hgb urine dipstick: NEGATIVE
Ketones, ur: NEGATIVE mg/dL
Leukocytes,Ua: NEGATIVE
Nitrite: NEGATIVE
Protein, ur: NEGATIVE mg/dL
Specific Gravity, Urine: 1.01 (ref 1.005–1.030)
pH: 5 (ref 5.0–8.0)

## 2019-05-12 LAB — CBC WITH DIFFERENTIAL/PLATELET
Abs Immature Granulocytes: 0.03 10*3/uL (ref 0.00–0.07)
Basophils Absolute: 0 10*3/uL (ref 0.0–0.1)
Basophils Relative: 0 %
Eosinophils Absolute: 0.2 10*3/uL (ref 0.0–0.5)
Eosinophils Relative: 3 %
HCT: 35.6 % — ABNORMAL LOW (ref 36.0–46.0)
Hemoglobin: 10.6 g/dL — ABNORMAL LOW (ref 12.0–15.0)
Immature Granulocytes: 0 %
Lymphocytes Relative: 35 %
Lymphs Abs: 2.6 10*3/uL (ref 0.7–4.0)
MCH: 27.5 pg (ref 26.0–34.0)
MCHC: 29.8 g/dL — ABNORMAL LOW (ref 30.0–36.0)
MCV: 92.5 fL (ref 80.0–100.0)
Monocytes Absolute: 0.7 10*3/uL (ref 0.1–1.0)
Monocytes Relative: 9 %
Neutro Abs: 3.9 10*3/uL (ref 1.7–7.7)
Neutrophils Relative %: 53 %
Platelets: 179 10*3/uL (ref 150–400)
RBC: 3.85 MIL/uL — ABNORMAL LOW (ref 3.87–5.11)
RDW: 15 % (ref 11.5–15.5)
WBC: 7.4 10*3/uL (ref 4.0–10.5)
nRBC: 0 % (ref 0.0–0.2)

## 2019-05-12 LAB — COMPREHENSIVE METABOLIC PANEL
ALT: 17 U/L (ref 0–44)
AST: 15 U/L (ref 15–41)
Albumin: 3.2 g/dL — ABNORMAL LOW (ref 3.5–5.0)
Alkaline Phosphatase: 72 U/L (ref 38–126)
Anion gap: 13 (ref 5–15)
BUN: 19 mg/dL (ref 6–20)
CO2: 37 mmol/L — ABNORMAL HIGH (ref 22–32)
Calcium: 9.2 mg/dL (ref 8.9–10.3)
Chloride: 89 mmol/L — ABNORMAL LOW (ref 98–111)
Creatinine, Ser: 0.88 mg/dL (ref 0.44–1.00)
GFR calc Af Amer: >60 mL/min (ref >60)
GFR calc non Af Amer: >60 mL/min (ref >60)
Glucose, Bld: 165 mg/dL — ABNORMAL HIGH (ref 70–99)
Potassium: 4.5 mmol/L (ref 3.5–5.1)
Sodium: 139 mmol/L (ref 135–145)
Total Bilirubin: 0.5 mg/dL (ref 0.3–1.2)
Total Protein: 8.2 g/dL — ABNORMAL HIGH (ref 6.5–8.1)

## 2019-05-12 LAB — LIPASE, BLOOD: Lipase: 35 U/L (ref 11–51)

## 2019-05-12 MED ORDER — MORPHINE SULFATE (PF) 4 MG/ML IV SOLN
4.0000 mg | Freq: Once | INTRAVENOUS | Status: AC
  Administered 2019-05-12: 18:00:00 4 mg via INTRAVENOUS
  Filled 2019-05-12: qty 1

## 2019-05-12 MED ORDER — SODIUM CHLORIDE 0.9 % IV SOLN
INTRAVENOUS | Status: DC
  Administered 2019-05-12: 16:00:00 via INTRAVENOUS

## 2019-05-12 MED ORDER — LEVOFLOXACIN 500 MG PO TABS
500.0000 mg | ORAL_TABLET | Freq: Once | ORAL | Status: AC
  Administered 2019-05-12: 21:00:00 500 mg via ORAL
  Filled 2019-05-12: qty 1

## 2019-05-12 MED ORDER — MORPHINE SULFATE (PF) 4 MG/ML IV SOLN
4.0000 mg | Freq: Once | INTRAVENOUS | Status: AC
  Administered 2019-05-12: 21:00:00 4 mg via INTRAVENOUS
  Filled 2019-05-12: qty 1

## 2019-05-12 MED ORDER — HYDROCODONE-ACETAMINOPHEN 5-325 MG PO TABS: 1.0000 | ORAL_TABLET | Freq: Four times a day (QID) | ORAL | 0 refills | Status: DC | PRN

## 2019-05-12 MED ORDER — SODIUM CHLORIDE (PF) 0.9 % IJ SOLN
INTRAMUSCULAR | Status: AC
  Filled 2019-05-12: qty 50

## 2019-05-12 MED ORDER — IOHEXOL 300 MG/ML  SOLN
100.0000 mL | Freq: Once | INTRAMUSCULAR | Status: AC | PRN
  Administered 2019-05-12: 100 mL via INTRAVENOUS

## 2019-05-12 MED ORDER — LEVOFLOXACIN 500 MG PO TABS: 500.0000 mg | ORAL_TABLET | Freq: Every day | ORAL | 0 refills | Status: DC

## 2019-05-12 NOTE — ED Notes (Signed)
Pt in bari bed with RT at bedside

## 2019-05-12 NOTE — ED Notes (Addendum)
12 lead EKG completed and sent with pt NSR rate in the 80's

## 2019-05-12 NOTE — ED Provider Notes (Signed)
[name] Provider Note   CSN: [id number] Arrival date & time: 05/12/19  1433     History   Chief Complaint Chief Complaint  Patient presents with  . Abdominal Pain    HPI Kim Jenkins is a 53 y.o. female.     HPI Patient presents with concern of left sided pain. Pain began about 24 hours ago.  On since that time pain is been persistent, left-sided, nonradiating. Pain is in the left lateral inferior side and axilla.  Line pain is nonradiating, sore, severe, sharp, not improved with Vicodin. Patient does have prior rash in this area, for which she has been using cream, without appreciable change. Patient also complains of left shoulder pain. Notably, patient is multiple medical problems including tracheostomy, requires oxygen 24/7.  On she lives in a nursing facility. Past Medical History:  Diagnosis Date  . Bipolar 1 disorder ([place])   . Chronic pain   . Depression   . Diabetes mellitus ([place])   . Hypertension   . Kidney stones   . Morbid obesity ([place])   . Obesity hypoventilation syndrome ([place])   . Obstructive sleep apnea   . Panniculitis     Patient Active Problem List   Diagnosis Date Noted  . Hypoxemia   . Pressure injury of skin 05/06/2018  . Acute on chronic respiratory failure with hypoxemia ([place])   . Hypercarbia   . GI bleed 05/05/2018  . Hemorrhagic shock ([place]) 05/05/2018  . Acute blood loss anemia 05/05/2018  . Tracheostomy care ([place]) 05/05/2018  . OSA (obstructive sleep apnea) 05/05/2018  . DM2 (diabetes mellitus, type 2) ([place]) 05/05/2018  . Bipolar 1 disorder ([place]) 05/05/2018  . Chronic pain 05/05/2018  . Depression 05/05/2018  . Acute kidney injury ([place]) 05/05/2018  . Acute upper GI bleed   . Tracheostomy status ([place]) 02/17/2014  . Morbid obesity ([place]) 02/17/2014  . Chronic respiratory failure with hypoxia and hypercapnia ([place]) 01/23/2014  . Acute encephalopathy 01/23/2014    Past Surgical History:  Procedure  Laterality Date  . ESOPHAGOGASTRODUODENOSCOPY N/A 05/06/2018   Procedure: ESOPHAGOGASTRODUODENOSCOPY (EGD);  Surgeon: Irving Copas., MD;  Location: [facility];  Service: Gastroenterology;  Laterality: N/A;  . RIGHT OOPHORECTOMY    . TRACHEOSTOMY    . VENTRAL HERNIA REPAIR       OB History   No obstetric history on file.      Home Medications    Prior to Admission medications   Medication Sig Start Date End Date Taking? Authorizing Provider  ARIPiprazole (ABILIFY) 5 MG tablet Take 1 tablet (5 mg total) by mouth daily. 09/13/18  Yes Ward, Ozella Almond, PA-C  chlorhexidine (HIBICLENS) 4 % external liquid Apply 1 application topically daily. For bath   Yes [provider]  Cholecalciferol 1000 units tablet Take 1,000 Units by mouth daily.   Yes [provider]  diazepam (VALIUM) 5 MG tablet Take 5 mg by mouth as needed for anxiety or muscle spasms.   Yes [provider]  divalproex (DEPAKOTE ER) 500 MG 24 hr tablet Take 2 tablets (1,000 mg total) by mouth every 12 (twelve) hours. 09/13/18  Yes Ward, Ozella Almond, PA-C  ergocalciferol (VITAMIN D2) 50000 units capsule Take 50,000 Units by mouth once a week.    Yes [provider]  Exenatide ER (BYDUREON) 2 MG PEN Inject 2 mg into the skin every Thursday.   Yes [provider]  famotidine (PEPCID) 20 MG tablet Take 20 mg by mouth at bedtime.  Yes [provider]  fluticasone (FLONASE) 50 MCG/ACT nasal spray Place 2 sprays into both nostrils daily.   Yes [provider]  insulin glargine (LANTUS) 100 UNIT/ML injection Inject 10-40 Units into the skin See admin instructions. Inject 10 units in the am and inject 40 at bedtime.   Yes [provider]  loratadine (CLARITIN) 10 MG tablet Take 10 mg by mouth daily.    Yes [provider]  metoprolol tartrate (LOPRESSOR) 50 MG tablet Take 50 mg by mouth 2 (two) times daily.   Yes [provider]   Multiple Vitamin (MULTIVITAMIN WITH MINERALS) TABS tablet Take 1 tablet by mouth daily.   Yes [provider]  olopatadine (PATANOL) 0.1 % ophthalmic solution Place 1 drop into both eyes 2 (two) times daily.   Yes [provider]  pantoprazole (PROTONIX) 40 MG tablet Take 40 mg by mouth daily.    Yes [provider]  torsemide (DEMADEX) 20 MG tablet Take 20 mg by mouth daily.   Yes [provider]  traZODone (DESYREL) 150 MG tablet Take 1 tablet (150 mg total) by mouth at bedtime. 09/13/18  Yes Ward, Ozella Almond, PA-C  venlafaxine XR (EFFEXOR-XR) 75 MG 24 hr capsule Take 225 mg by mouth daily with breakfast.   Yes [provider]  HYDROcodone-acetaminophen (NORCO/VICODIN) 5-325 MG tablet Take 1 tablet by mouth every 6 (six) hours as needed for moderate pain. 05/12/19   Carmin Muskrat, MD  levofloxacin (LEVAQUIN) 500 MG tablet Take 1 tablet (500 mg total) by mouth daily. 05/12/19   Carmin Muskrat, MD    Family History No family history on file.  Social History Social History   Tobacco Use  . Smoking status: Never Smoker  . Smokeless tobacco: Never Used  Substance Use Topics  . Alcohol use: No    Frequency: Never  . Drug use: No     Allergies   Meperidine and related and Cefazolin   Review of Systems Review of Systems  Constitutional:       Per HPI, otherwise negative  HENT:       Per HPI, otherwise negative  Respiratory:       Per HPI, otherwise negative  Cardiovascular:       Per HPI, otherwise negative  Gastrointestinal: Negative for nausea and vomiting.  Endocrine:       Negative aside from HPI  Genitourinary:       Neg aside from HPI   Musculoskeletal:       Per HPI, otherwise negative  Skin: Positive for rash.  Neurological: Negative for syncope.     Physical Exam Updated Vital Signs BP 133/65   Pulse 89   Temp 98.1 F (36.7 C) (Oral)   Resp (!) 24   Ht 5' 6.5" (1.689 m)   Wt (!) 166 kg   SpO2 99%    BMI 58.19 kg/m   Physical Exam Vitals signs and nursing note reviewed.  Constitutional:      Appearance: She is ill-appearing.     Comments: Morbidly obese female with tracheostomy collar in place awake and alert speaking in an animated fashion.  HENT:     Head: Normocephalic and atraumatic.  Eyes:     Conjunctiva/sclera: Conjunctivae normal.  Cardiovascular:     Rate and Rhythm: Normal rate and regular rhythm.  Pulmonary:     Effort: Tachypnea present.     Breath sounds: Decreased breath sounds present.     Comments: Tachypnea, increased work of breathing, tracheostomy  collar, with typical amount of oxygen, per respiratory therapy. Patient is hypoxic, 90%. Abdominal:     General: There is no distension.     Tenderness: There is abdominal tenderness in the left upper quadrant.  Skin:    General: Skin is warm and dry.     Findings: Rash present.     Comments: Dry, yeastlike rash in the left axilla, left lateral inframammary region no pustules.  Neurological:     Mental Status: She is alert and oriented to person, place, and time.     Cranial Nerves: No cranial nerve deficit.     Motor: Weakness and atrophy present.      ED Treatments / Results  Labs (all labs ordered are listed, but only abnormal results are displayed) Labs Reviewed  COMPREHENSIVE METABOLIC PANEL - Abnormal; Notable for the following components:      Result Value   Chloride 89 (*)    CO2 37 (*)    Glucose, Bld 165 (*)    Total Protein 8.2 (*)    Albumin 3.2 (*)    All other components within normal limits  CBC WITH DIFFERENTIAL/PLATELET - Abnormal; Notable for the following components:   RBC 3.85 (*)    Hemoglobin 10.6 (*)    HCT 35.6 (*)    MCHC 29.8 (*)    All other components within normal limits  LIPASE, BLOOD  URINALYSIS, ROUTINE W REFLEX MICROSCOPIC    Radiology Ct Abdomen Pelvis W Contrast  Result Date: 05/12/2019 CLINICAL DATA:  Left flank and lower quadrant pain EXAM: CT ABDOMEN AND  PELVIS WITH CONTRAST TECHNIQUE: Multidetector CT imaging of the abdomen and pelvis was performed using the standard protocol following bolus administration of intravenous contrast. CONTRAST:  120mL OMNIPAQUE IOHEXOL 300 MG/ML  SOLN COMPARISON:  CT 05/06/2018 FINDINGS: Lower chest: Lung bases demonstrate partial consolidation in the right lower lung with nodular density in the right middle lobe. No pleural effusion. Heart size within normal limits. Hepatobiliary: Possible surface nodularity of the liver. No calcified gallstone or biliary dilatation Pancreas: Unremarkable. No pancreatic ductal dilatation or surrounding inflammatory changes. Spleen: Normal in size without focal abnormality. Adrenals/Urinary Tract: Adrenal glands appear within normal limits. No hydronephrosis. Subcentimeter hypodensity mid left kidney, too small to further characterize. The bladder is normal. Stomach/Bowel: Moderate gastric distension. No evidence for bowel obstruction. No colon wall thickening. Negative appendix. Vascular/Lymphatic: Moderate aortic atherosclerosis. No aneurysm. No significantly enlarged lymph nodes Reproductive: Uterus and bilateral adnexa are unremarkable. Other: Negative for free air or free fluid. Prior ventral hernia repair. Musculoskeletal: Old right eighth rib fracture. Degenerative changes of the spine IMPRESSION: 1. No CT evidence for acute intra-abdominal or pelvic abnormality. 2. Possible surface nodularity of the liver, question cirrhosis. 3. Patchy consolidation at the right lung base with nodular density in the right middle lobe, possible respiratory infection. Electronically Signed   By: Donavan Foil M.D.   On: 05/12/2019 19:05   Dg Chest Port 1 View  Result Date: 05/12/2019 CLINICAL DATA:  Hypoxia EXAM: PORTABLE CHEST 1 VIEW COMPARISON:  Portable exam 1617 hours compared to 01/17/2019 FINDINGS: Tracheostomy tube again seen. Rotated to the RIGHT. Normal heart size, mediastinal contours, and pulmonary  vascularity normal. Lungs clear. No pleural effusion or pneumothorax. IMPRESSION: No acute abnormalities. Electronically Signed   By: Lavonia Dana M.D.   On: 05/12/2019 16:40   Dg Shoulder Left  Result Date: 05/12/2019 CLINICAL DATA:  Left shoulder pain.  No injury. EXAM: LEFT SHOULDER - 2+ VIEW COMPARISON:  Left  shoulder x-rays dated Jan 17, 2019. FINDINGS: There is no evidence of fracture or dislocation. There is no evidence of arthropathy or other focal bone abnormality. Soft tissues are unremarkable. IMPRESSION: Negative. Electronically Signed   By: Titus Dubin M.D.   On: 05/12/2019 16:41    Procedures Procedures (including critical care time)  Medications Ordered in ED Medications  0.9 %  sodium chloride infusion ( Intravenous Rate/Dose Verify 05/12/19 1817)  sodium chloride (PF) 0.9 % injection (has no administration in time range)  levofloxacin (LEVAQUIN) tablet 500 mg (has no administration in time range)  morphine 4 MG/ML injection 4 mg (has no administration in time range)  morphine 4 MG/ML injection 4 mg (4 mg Intravenous Given 05/12/19 1814)  iohexol (OMNIPAQUE) 300 MG/ML solution 100 mL (100 mLs Intravenous Contrast Given 05/12/19 1834)     Initial Impression / Assessment and Plan / ED Course  I have reviewed the triage vital signs and the nursing notes.  Pertinent labs & imaging results that were available during my care of the patient were reviewed by me and considered in my medical decision making (see chart for details).        8:11 PM Patient in no distress, sitting upright. She continues to require her typical amount of home oxygen, states that her breathing is essentially same as normal. However, with abnormal x-ray, CT, given her dependency on trach collar, she will start a course of antibiotics for presumed early pneumonia, right lower lobe. Patient's evaluation in terms of her side pain is reassuring, with CT scan that is reassuring, labs reassuring, no  leukocytosis, no fever, little suspicion for acute intra-abdominal process. Some suspicion for cutaneous changes contributing to her discomfort. Patient lives in a nursing facility, has monitoring will follow-up with primary care as needed, otherwise be discharged with analgesia, antibiotics.  Final Clinical Impressions(s) / ED Diagnoses   Final diagnoses:  Left upper quadrant pain    ED Discharge Orders         Ordered    HYDROcodone-acetaminophen (NORCO/VICODIN) 5-325 MG tablet  Every 6 hours PRN     05/12/19 2010    levofloxacin (LEVAQUIN) 500 MG tablet  Daily     05/12/19 2010           Carmin Muskrat, MD 05/12/19 2012

## 2019-05-12 NOTE — Discharge Instructions (Signed)
As discussed, your evaluation today has been largely reassuring.  But, it is important that you monitor your condition carefully, and do not hesitate to return to the ED if you develop new, or concerning changes in your condition. Your evaluation in regards to your side pain suggest that your rash may be contributing to the discomfort. However, there are may be developing pneumonia.  You have received your initial antibiotics, and is important you obtain your prescription for completion of the course of antibiotics.  Otherwise, please follow-up with your physician for appropriate ongoing care.

## 2019-05-12 NOTE — ED Triage Notes (Addendum)
Pt BIBA from [facility]. 2 days ago, started having left flank/LLQ pain with cramping.  CT and XR at [facility] will not fit pt.  Denies fever, urinary issues. Pt had normal BMs yesterday. No other complaints.  Trach usually on 40% humidified oxygen at 11L during day. Pt goes on vent at night.

## 2019-05-12 NOTE — ED Notes (Signed)
[facility] called for transport.  

## 2019-05-12 NOTE — ED Notes (Signed)
Patient transported to CT 

## 2019-05-13 DIAGNOSIS — R1012 Left upper quadrant pain: Secondary | ICD-10-CM | POA: Diagnosis not present

## 2019-05-13 NOTE — ED Notes (Signed)
[employer] notified of need for transport of pt back to [facility]

## 2019-05-18 ENCOUNTER — Other Ambulatory Visit: Payer: Self-pay

## 2019-05-18 ENCOUNTER — Emergency Department (HOSPITAL_COMMUNITY)
Admission: EM | Admit: 2019-05-18 | Discharge: 2019-05-18 | Disposition: A | Discharge status: Home / Self Care | Payer: Medicare Other | Attending: Emergency Medicine | Admitting: Emergency Medicine

## 2019-05-18 ENCOUNTER — Emergency Department (HOSPITAL_COMMUNITY): Payer: Medicare Other

## 2019-05-18 DIAGNOSIS — Z79899 Other long term (current) drug therapy: Secondary | ICD-10-CM | POA: Insufficient documentation

## 2019-05-18 DIAGNOSIS — R0602 Shortness of breath: Secondary | ICD-10-CM | POA: Insufficient documentation

## 2019-05-18 DIAGNOSIS — Z794 Long term (current) use of insulin: Secondary | ICD-10-CM | POA: Insufficient documentation

## 2019-05-18 DIAGNOSIS — I10 Essential (primary) hypertension: Secondary | ICD-10-CM | POA: Diagnosis not present

## 2019-05-18 DIAGNOSIS — E119 Type 2 diabetes mellitus without complications: Secondary | ICD-10-CM | POA: Insufficient documentation

## 2019-05-18 DIAGNOSIS — R0789 Other chest pain: Secondary | ICD-10-CM | POA: Insufficient documentation

## 2019-05-18 LAB — COMPREHENSIVE METABOLIC PANEL
ALT: 18 U/L (ref 0–44)
AST: 29 U/L (ref 15–41)
Albumin: 2.9 g/dL — ABNORMAL LOW (ref 3.5–5.0)
Alkaline Phosphatase: 61 U/L (ref 38–126)
Anion gap: 10 (ref 5–15)
BUN: 18 mg/dL (ref 6–20)
CO2: 43 mmol/L — ABNORMAL HIGH (ref 22–32)
Calcium: 9.3 mg/dL (ref 8.9–10.3)
Chloride: 85 mmol/L — ABNORMAL LOW (ref 98–111)
Creatinine, Ser: 0.79 mg/dL (ref 0.44–1.00)
GFR calc Af Amer: >60 mL/min (ref >60)
GFR calc non Af Amer: >60 mL/min (ref >60)
Glucose, Bld: 180 mg/dL — ABNORMAL HIGH (ref 70–99)
Potassium: 4.2 mmol/L (ref 3.5–5.1)
Sodium: 138 mmol/L (ref 135–145)
Total Bilirubin: 0.6 mg/dL (ref 0.3–1.2)
Total Protein: 7.9 g/dL (ref 6.5–8.1)

## 2019-05-18 LAB — CBC WITH DIFFERENTIAL/PLATELET
Abs Immature Granulocytes: 0.03 10*3/uL (ref 0.00–0.07)
Basophils Absolute: 0 10*3/uL (ref 0.0–0.1)
Basophils Relative: 0 %
Eosinophils Absolute: 0.1 10*3/uL (ref 0.0–0.5)
Eosinophils Relative: 2 %
HCT: 36.1 % (ref 36.0–46.0)
Hemoglobin: 10.8 g/dL — ABNORMAL LOW (ref 12.0–15.0)
Immature Granulocytes: 1 %
Lymphocytes Relative: 29 %
Lymphs Abs: 2 10*3/uL (ref 0.7–4.0)
MCH: 27.4 pg (ref 26.0–34.0)
MCHC: 29.9 g/dL — ABNORMAL LOW (ref 30.0–36.0)
MCV: 91.6 fL (ref 80.0–100.0)
Monocytes Absolute: 0.6 10*3/uL (ref 0.1–1.0)
Monocytes Relative: 10 %
Neutro Abs: 3.9 10*3/uL (ref 1.7–7.7)
Neutrophils Relative %: 58 %
Platelets: 158 10*3/uL (ref 150–400)
RBC: 3.94 MIL/uL (ref 3.87–5.11)
RDW: 14.9 % (ref 11.5–15.5)
WBC: 6.6 10*3/uL (ref 4.0–10.5)
nRBC: 0 % (ref 0.0–0.2)

## 2019-05-18 LAB — TROPONIN I (HIGH SENSITIVITY): Troponin I (High Sensitivity): 7 ng/L (ref <18)

## 2019-05-18 LAB — LIPASE, BLOOD: Lipase: 34 U/L (ref 11–51)

## 2019-05-18 MED ORDER — MORPHINE SULFATE (PF) 2 MG/ML IV SOLN
2.0000 mg | Freq: Once | INTRAVENOUS | Status: AC
  Administered 2019-05-18: 11:00:00 2 mg via INTRAVENOUS
  Filled 2019-05-18: qty 1

## 2019-05-18 MED ORDER — ACETAMINOPHEN 500 MG PO TABS
1000.0000 mg | ORAL_TABLET | Freq: Once | ORAL | Status: AC
  Administered 2019-05-18: 09:00:00 1000 mg via ORAL
  Filled 2019-05-18: qty 2

## 2019-05-18 MED ORDER — MORPHINE SULFATE (PF) 2 MG/ML IV SOLN
2.0000 mg | Freq: Once | INTRAVENOUS | Status: AC
  Administered 2019-05-18: 09:00:00 2 mg via INTRAVENOUS
  Filled 2019-05-18: qty 1

## 2019-05-18 MED ORDER — KETOROLAC TROMETHAMINE 15 MG/ML IJ SOLN
15.0000 mg | Freq: Once | INTRAMUSCULAR | Status: AC
  Administered 2019-05-18: 15 mg via INTRAVENOUS
  Filled 2019-05-18: qty 1

## 2019-05-18 NOTE — Progress Notes (Signed)
RT note: RT called to patients room for desat. RT removed patients PMV and suctioned  A moderate amount of thick white secretions from patients trach. RT placed patient on 60% ATC with PMV removed.Vital signs stable at this tine.MD aware.

## 2019-05-18 NOTE — ED Notes (Signed)
Called for [facility] transport

## 2019-05-18 NOTE — ED Triage Notes (Signed)
Pt here for evaluation of L chest pain since yesterday. 12 lead unremarkable at [facility]. Vicodin given, pt not satisfied. Pt slept through the night and woke up asking for more pain medication d/t right sided chest pain, something different than vicodin. 6L, 40% fiO2 trach collar. Pt on the vent only at night.

## 2019-05-18 NOTE — Discharge Instructions (Signed)
Follow up with your doctor at the facility.  You have pain when I push on your chest.  This most likely is due to a strain of the muscles there.  Talk with your doctor about the right medicines to take for you.

## 2019-05-18 NOTE — ED Provider Notes (Addendum)
[facility] EMERGENCY DEPARTMENT Provider Note   CSN: [id number] Arrival date & time: 05/18/19  [id number]     History   Chief Complaint Chief Complaint  Patient presents with  . Chest Pain    HPI Kim Jenkins is a 53 y.o. female.     54 yo F with a chief complaints of chest pain.  This been going on since last night.  She is unable to describe the pain.  No radiation.  Nothing seems to make it better or worse.  Mildly short of breath with it.  Was seen in an ED about 5 days ago she thinks and diagnosed with pneumonia.  Her cough is at baseline.  No fever.  Having a little bit of abdominal pain off and on.  She has had a history of blood clot though she is not sure where.  Used to be on anticoagulation but had a significant bleed and so has been taken off of it.  Is chronically debilitated and lives in a full care facility.  No history of MI.  History of hypertension hyperlipidemia and diabetes.  Remote history of smoking.  No known family history of MI.  Denies hemoptysis denies recent surgery.  The history is provided by the patient and medical records.  Chest Pain Pain location:  L chest and R chest Pain quality comment:  Unable to specify Pain radiates to:  Does not radiate Pain severity:  Moderate Onset quality:  Gradual Duration:  12 hours Timing:  Constant Progression:  Worsening Chronicity:  New Relieved by:  Nothing Worsened by:  Nothing Ineffective treatments:  None tried Associated symptoms: abdominal pain   Associated symptoms: no dizziness, no fever, no headache, no nausea, no palpitations, no shortness of breath and no vomiting     Past Medical History:  Diagnosis Date  . Bipolar 1 disorder ([place])   . Chronic pain   . Depression   . Diabetes mellitus ([place])   . Hypertension   . Kidney stones   . Morbid obesity ([place])   . Obesity hypoventilation syndrome ([place])   . Obstructive sleep apnea   . Panniculitis     Patient Active Problem List   Diagnosis Date Noted  . Hypoxemia   . Pressure injury of skin 05/06/2018  . Acute on chronic respiratory failure with hypoxemia ([place])   . Hypercarbia   . GI bleed 05/05/2018  . Hemorrhagic shock ([place]) 05/05/2018  . Acute blood loss anemia 05/05/2018  . Tracheostomy care ([place]) 05/05/2018  . OSA (obstructive sleep apnea) 05/05/2018  . DM2 (diabetes mellitus, type 2) ([place]) 05/05/2018  . Bipolar 1 disorder ([place]) 05/05/2018  . Chronic pain 05/05/2018  . Depression 05/05/2018  . Acute kidney injury ([place]) 05/05/2018  . Acute upper GI bleed   . Tracheostomy status ([place]) 02/17/2014  . Morbid obesity ([place]) 02/17/2014  . Chronic respiratory failure with hypoxia and hypercapnia ([place]) 01/23/2014  . Acute encephalopathy 01/23/2014    Past Surgical History:  Procedure Laterality Date  . ESOPHAGOGASTRODUODENOSCOPY N/A 05/06/2018   Procedure: ESOPHAGOGASTRODUODENOSCOPY (EGD);  Surgeon: Irving Copas., MD;  Location: [facility];  Service: Gastroenterology;  Laterality: N/A;  . RIGHT OOPHORECTOMY    . TRACHEOSTOMY    . VENTRAL HERNIA REPAIR       OB History   No obstetric history on file.      Home Medications    Prior to Admission medications   Medication Sig Start Date End Date Taking? Authorizing Provider  ARIPiprazole (ABILIFY) 5  MG tablet Take 1 tablet (5 mg total) by mouth daily. 09/13/18   Ward, Ozella Almond, PA-C  chlorhexidine (HIBICLENS) 4 % external liquid Apply 1 application topically daily. For bath    [provider]  Cholecalciferol 1000 units tablet Take 1,000 Units by mouth daily.    [provider]  diazepam (VALIUM) 5 MG tablet Take 5 mg by mouth as needed for anxiety or muscle spasms.    [provider]  divalproex (DEPAKOTE ER) 500 MG 24 hr tablet Take 2 tablets (1,000 mg total) by mouth every 12 (twelve) hours. 09/13/18   Ward, Ozella Almond, PA-C  ergocalciferol (VITAMIN D2) 50000 units capsule Take 50,000 Units by mouth once a  week.     [provider]  Exenatide ER (BYDUREON) 2 MG PEN Inject 2 mg into the skin every Thursday.    [provider]  famotidine (PEPCID) 20 MG tablet Take 20 mg by mouth at bedtime.    [provider]  fluticasone (FLONASE) 50 MCG/ACT nasal spray Place 2 sprays into both nostrils daily.    [provider]  HYDROcodone-acetaminophen (NORCO/VICODIN) 5-325 MG tablet Take 1 tablet by mouth every 6 (six) hours as needed for moderate pain. 05/12/19   Carmin Muskrat, MD  insulin glargine (LANTUS) 100 UNIT/ML injection Inject 10-40 Units into the skin See admin instructions. Inject 10 units in the am and inject 40 at bedtime.    [provider]  levofloxacin (LEVAQUIN) 500 MG tablet Take 1 tablet (500 mg total) by mouth daily. 05/12/19   Carmin Muskrat, MD  loratadine (CLARITIN) 10 MG tablet Take 10 mg by mouth daily.     [provider]  metoprolol tartrate (LOPRESSOR) 50 MG tablet Take 50 mg by mouth 2 (two) times daily.    [provider]  Multiple Vitamin (MULTIVITAMIN WITH MINERALS) TABS tablet Take 1 tablet by mouth daily.    [provider]  olopatadine (PATANOL) 0.1 % ophthalmic solution Place 1 drop into both eyes 2 (two) times daily.    [provider]  pantoprazole (PROTONIX) 40 MG tablet Take 40 mg by mouth daily.     [provider]  torsemide (DEMADEX) 20 MG tablet Take 20 mg by mouth daily.    [provider]  traZODone (DESYREL) 150 MG tablet Take 1 tablet (150 mg total) by mouth at bedtime. 09/13/18   Ward, Ozella Almond, PA-C  venlafaxine XR (EFFEXOR-XR) 75 MG 24 hr capsule Take 225 mg by mouth daily with breakfast.    [provider]    Family History No family history on file.  Social History Social History   Tobacco Use  . Smoking status: Never Smoker  . Smokeless tobacco: Never Used  Substance Use Topics  . Alcohol use: No    Frequency: Never  . Drug use: No      Allergies   Meperidine and related and Cefazolin   Review of Systems Review of Systems  Constitutional: Negative for chills and fever.  HENT: Negative for congestion and rhinorrhea.   Eyes: Negative for redness and visual disturbance.  Respiratory: Negative for shortness of breath and wheezing.   Cardiovascular: Positive for chest pain. Negative for palpitations.  Gastrointestinal: Positive for abdominal pain. Negative for nausea and vomiting.  Genitourinary: Negative for dysuria and urgency.  Musculoskeletal: Negative for arthralgias and myalgias.  Skin: Negative for pallor and wound.  Neurological: Negative for dizziness and headaches.     Physical Exam Updated Vital Signs BP Marland Kitchen)  103/55   Pulse 92   Temp 99 F (37.2 C) (Oral)   Resp (!) 22   SpO2 (!) 88%   Physical Exam Vitals signs and nursing note reviewed.  Constitutional:      General: She is not in acute distress.    Appearance: She is well-developed. She is morbidly obese. She is not diaphoretic.  HENT:     Head: Normocephalic and atraumatic.  Eyes:     Pupils: Pupils are equal, round, and reactive to light.  Neck:     Musculoskeletal: Normal range of motion and neck supple.  Cardiovascular:     Rate and Rhythm: Normal rate and regular rhythm.     Heart sounds: No murmur. No friction rub. No gallop.   Pulmonary:     Effort: Pulmonary effort is normal.     Breath sounds: No wheezing or rales.     Comments: Distant breath sounds Chest:     Chest wall: Tenderness present.     Comments: Tenderness on palpation of the sternum and surrounding chest wall.  Reproduces the patient's symptoms. Abdominal:     General: There is no distension.     Palpations: Abdomen is soft.     Tenderness: There is no abdominal tenderness.  Musculoskeletal:        General: No tenderness.  Skin:    General: Skin is warm and dry.  Neurological:     Mental Status: She is alert and oriented to person, place, and time.   Psychiatric:        Behavior: Behavior normal.      ED Treatments / Results  Labs (all labs ordered are listed, but only abnormal results are displayed) Labs Reviewed  CBC WITH DIFFERENTIAL/PLATELET - Abnormal; Notable for the following components:      Result Value   Hemoglobin 10.8 (*)    MCHC 29.9 (*)    All other components within normal limits  COMPREHENSIVE METABOLIC PANEL - Abnormal; Notable for the following components:   Chloride 85 (*)    CO2 43 (*)    Glucose, Bld 180 (*)    Albumin 2.9 (*)    All other components within normal limits  LIPASE, BLOOD  TROPONIN I (HIGH SENSITIVITY)    EKG EKG Interpretation  Date/Time:  Sunday May 18 2019 10:49:12 EDT Ventricular Rate:  100 PR Interval:    QRS Duration: 72 QT Interval:  321 QTC Calculation: 414 R Axis:   66 Text Interpretation:  Sinus tachycardia Low voltage, precordial leads Abnormal R-wave progression, early transition Borderline T wave abnormalities No significant change since last tracing Confirmed by Deno Etienne [phone]) on 05/18/2019 10:57:58 AM   Radiology Dg Chest Port 1 View  Result Date: 05/18/2019 CLINICAL DATA:  53 year old female with chest pain EXAM: PORTABLE CHEST 1 VIEW COMPARISON:  Prior chest x-ray 05/12/2019 FINDINGS: Tracheostomy tube in stable position. The tip is midline and at the level of the clavicles. Stable cardiomegaly. Chronic bronchitic changes and mild interstitial prominence again noted. Perhaps slightly increased interstitial prominence. No pleural effusion or pneumothorax. IMPRESSION: 1. Slightly increased interstitial prominence concerning for mild interstitial edema. 2. Otherwise, stable chest x-ray. Electronically Signed   By: Jacqulynn Cadet M.D.   On: 05/18/2019 09:51    Procedures Procedures (including critical care time)  Medications Ordered in ED Medications  ketorolac (TORADOL) 15 MG/ML injection 15 mg (has no administration in time range)  acetaminophen  (TYLENOL) tablet 1,000 mg (1,000 mg Oral Given 05/18/19 0923)  morphine 2 MG/ML  injection 2 mg (2 mg Intravenous Given 05/18/19 0923)  morphine 2 MG/ML injection 2 mg (2 mg Intravenous Given 05/18/19 1101)     Initial Impression / Assessment and Plan / ED Course  I have reviewed the triage vital signs and the nursing notes.  Pertinent labs & imaging results that were available during my care of the patient were reviewed by me and considered in my medical decision making (see chart for details).        53 yo F with a chief complaint of chest pain.  This is reproduced on exam.  Most likely this is musculoskeletal.  Will obtain a laboratory evaluation including troponin.  Patient has had pain greater than 6 hours I feel that one troponin should be sufficient.  Chest x-ray.  Reassess.  Troponin is negative.  Lab work is otherwise unremarkable.  Chest x-ray difficult to interpret due to body habitus however no obvious pneumonia or change.  At this point will discharge the patient home.  PCP follow-up.  11:01 AM:  I have discussed the diagnosis/risks/treatment options with the patient and believe the pt to be eligible for discharge home to follow-up with PCP. We also discussed returning to the ED immediately if new or worsening sx occur. We discussed the sx which are most concerning (e.g., sudden worsening pain, fever, inability to tolerate by mouth ) that necessitate immediate return. Medications administered to the patient during their visit and any new prescriptions provided to the patient are listed below.  Medications given during this visit Medications  ketorolac (TORADOL) 15 MG/ML injection 15 mg (has no administration in time range)  acetaminophen (TYLENOL) tablet 1,000 mg (1,000 mg Oral Given 05/18/19 0923)  morphine 2 MG/ML injection 2 mg (2 mg Intravenous Given 05/18/19 0923)  morphine 2 MG/ML injection 2 mg (2 mg Intravenous Given 05/18/19 1101)     The patient appears reasonably screen  and/or stabilized for discharge and I doubt any other medical condition or other [facility] requiring further screening, evaluation, or treatment in the ED at this time prior to discharge.    Final Clinical Impressions(s) / ED Diagnoses   Final diagnoses:  Chest wall pain    ED Discharge Orders    None       Deno Etienne, DO 05/18/19 [place], DO 05/18/19 1101

## 2019-10-18 ENCOUNTER — Emergency Department (HOSPITAL_COMMUNITY): Payer: Medicare Other

## 2019-10-18 ENCOUNTER — Emergency Department (HOSPITAL_COMMUNITY)
Admission: EM | Admit: 2019-10-18 | Discharge: 2019-10-19 | Disposition: A | Discharge status: Home / Self Care | Payer: Medicare Other | Attending: Emergency Medicine | Admitting: Emergency Medicine

## 2019-10-18 ENCOUNTER — Other Ambulatory Visit: Payer: Self-pay

## 2019-10-18 DIAGNOSIS — E662 Morbid (severe) obesity with alveolar hypoventilation: Secondary | ICD-10-CM | POA: Insufficient documentation

## 2019-10-18 DIAGNOSIS — Z79899 Other long term (current) drug therapy: Secondary | ICD-10-CM | POA: Diagnosis not present

## 2019-10-18 DIAGNOSIS — I10 Essential (primary) hypertension: Secondary | ICD-10-CM | POA: Diagnosis not present

## 2019-10-18 DIAGNOSIS — Z930 Tracheostomy status: Secondary | ICD-10-CM | POA: Insufficient documentation

## 2019-10-18 DIAGNOSIS — R569 Unspecified convulsions: Secondary | ICD-10-CM | POA: Diagnosis present

## 2019-10-18 DIAGNOSIS — E119 Type 2 diabetes mellitus without complications: Secondary | ICD-10-CM | POA: Insufficient documentation

## 2019-10-18 DIAGNOSIS — Z20822 Contact with and (suspected) exposure to covid-19: Secondary | ICD-10-CM | POA: Insufficient documentation

## 2019-10-18 DIAGNOSIS — R0602 Shortness of breath: Secondary | ICD-10-CM | POA: Insufficient documentation

## 2019-10-18 DIAGNOSIS — Z794 Long term (current) use of insulin: Secondary | ICD-10-CM | POA: Insufficient documentation

## 2019-10-18 LAB — CBC WITH DIFFERENTIAL/PLATELET
Abs Immature Granulocytes: 0.04 10*3/uL (ref 0.00–0.07)
Basophils Absolute: 0 10*3/uL (ref 0.0–0.1)
Basophils Relative: 1 %
Eosinophils Absolute: 0.1 10*3/uL (ref 0.0–0.5)
Eosinophils Relative: 2 %
HCT: 37.3 % (ref 36.0–46.0)
Hemoglobin: 10.8 g/dL — ABNORMAL LOW (ref 12.0–15.0)
Immature Granulocytes: 1 %
Lymphocytes Relative: 32 %
Lymphs Abs: 1.8 10*3/uL (ref 0.7–4.0)
MCH: 28 pg (ref 26.0–34.0)
MCHC: 29 g/dL — ABNORMAL LOW (ref 30.0–36.0)
MCV: 96.6 fL (ref 80.0–100.0)
Monocytes Absolute: 0.5 10*3/uL (ref 0.1–1.0)
Monocytes Relative: 9 %
Neutro Abs: 3.1 10*3/uL (ref 1.7–7.7)
Neutrophils Relative %: 55 %
Platelets: 197 10*3/uL (ref 150–400)
RBC: 3.86 MIL/uL — ABNORMAL LOW (ref 3.87–5.11)
RDW: 14.4 % (ref 11.5–15.5)
WBC: 5.5 10*3/uL (ref 4.0–10.5)
nRBC: 0 % (ref 0.0–0.2)

## 2019-10-18 LAB — COMPREHENSIVE METABOLIC PANEL
ALT: 9 U/L (ref 0–44)
AST: 17 U/L (ref 15–41)
Albumin: 2.7 g/dL — ABNORMAL LOW (ref 3.5–5.0)
Alkaline Phosphatase: 50 U/L (ref 38–126)
Anion gap: 11 (ref 5–15)
BUN: 9 mg/dL (ref 6–20)
CO2: 42 mmol/L — ABNORMAL HIGH (ref 22–32)
Calcium: 9.4 mg/dL (ref 8.9–10.3)
Chloride: 90 mmol/L — ABNORMAL LOW (ref 98–111)
Creatinine, Ser: 0.78 mg/dL (ref 0.44–1.00)
GFR calc Af Amer: >60 mL/min (ref >60)
GFR calc non Af Amer: >60 mL/min (ref >60)
Glucose, Bld: 127 mg/dL — ABNORMAL HIGH (ref 70–99)
Potassium: 4.2 mmol/L (ref 3.5–5.1)
Sodium: 143 mmol/L (ref 135–145)
Total Bilirubin: 0.5 mg/dL (ref 0.3–1.2)
Total Protein: 7.7 g/dL (ref 6.5–8.1)

## 2019-10-18 LAB — URINALYSIS, ROUTINE W REFLEX MICROSCOPIC
Glucose, UA: NEGATIVE mg/dL
Ketones, ur: 5 mg/dL — AB
Nitrite: NEGATIVE
Protein, ur: 100 mg/dL — AB
Specific Gravity, Urine: 1.025 (ref 1.005–1.030)
pH: 5 (ref 5.0–8.0)

## 2019-10-18 LAB — POCT I-STAT EG7
Acid-Base Excess: 24 mmol/L — ABNORMAL HIGH (ref 0.0–2.0)
Bicarbonate: 48.3 mmol/L — ABNORMAL HIGH (ref 20.0–28.0)
Calcium, Ion: 1.09 mmol/L — ABNORMAL LOW (ref 1.15–1.40)
HCT: 30 % — ABNORMAL LOW (ref 36.0–46.0)
Hemoglobin: 10.2 g/dL — ABNORMAL LOW (ref 12.0–15.0)
O2 Saturation: 98 %
Patient temperature: 96.3
Potassium: 3.7 mmol/L (ref 3.5–5.1)
Sodium: 142 mmol/L (ref 135–145)
TCO2: 50 mmol/L — ABNORMAL HIGH (ref 22–32)
pCO2, Ven: 44.9 mmHg (ref 44.0–60.0)
pH, Ven: 7.635 (ref 7.250–7.430)
pO2, Ven: 77 mmHg — ABNORMAL HIGH (ref 32.0–45.0)

## 2019-10-18 LAB — RESPIRATORY PANEL BY RT PCR (FLU A&B, COVID)
Influenza A by PCR: NEGATIVE
Influenza B by PCR: NEGATIVE
SARS Coronavirus 2 by RT PCR: NEGATIVE

## 2019-10-18 LAB — VALPROIC ACID LEVEL: Valproic Acid Lvl: 69 ug/mL (ref 50.0–100.0)

## 2019-10-18 LAB — TROPONIN I (HIGH SENSITIVITY): Troponin I (High Sensitivity): 69 ng/L — ABNORMAL HIGH (ref <18)

## 2019-10-18 LAB — CBG MONITORING, ED: Glucose-Capillary: 98 mg/dL (ref 70–99)

## 2019-10-18 MED ORDER — LEVOFLOXACIN 750 MG PO TABS
750.0000 mg | ORAL_TABLET | Freq: Once | ORAL | Status: AC
  Administered 2019-10-19: 01:00:00 750 mg via ORAL
  Filled 2019-10-18: qty 1

## 2019-10-18 MED ORDER — LEVOFLOXACIN 750 MG PO TABS: 750.0000 mg | ORAL_TABLET | Freq: Every day | ORAL | 0 refills | Status: DC

## 2019-10-18 NOTE — Progress Notes (Signed)
RT attempted ABG, unable to obtain adequate sample. Okay to obtain venous sample per MD.

## 2019-10-18 NOTE — ED Notes (Signed)
Dr Sabra Heck informed of EG7 results

## 2019-10-18 NOTE — ED Provider Notes (Signed)
[facility] EMERGENCY DEPARTMENT Provider Note   CSN: [id number] Arrival date & time: 10/18/19  1940     History Chief Complaint  Patient presents with  . Seizures    Kim Jenkins is a 54 y.o. female.  HPI   54 y/o female - hx of Bipolar, DM, Morbid Obesity -as well as a history of obesity hypoventilation syndrome, she has had a tracheostomy and currently takes 40% oxygen over a trach collar during the day and ventilation at night.  As the evening wore on today at the [facility] where she currently resides she had an episode where she was not breathing very well, required some assisted ventilations by respiratory with bag valve on the tracheostomy, there was a short period where she had some tremor in her arms which was thought to be a possible seizure and when they called the physician on-call he wanted her to be transported to the ER for evaluation.  The paramedics report that shortly after getting assisted ventilations the patient was waking up and seem to be more herself, I personally spoke with the nurse at the facility who states that when she had a left she was still not quite back to baseline but was much much better.  The patient is able to open her eyes follow commands lift all 4 extremities, she seems generally weak but able to follow commands appropriately.  When I ask if she hurts she says no.  Level 5 caveat applies secondary to altered mental status  Medical record reviewed at length, the patient does take Valium as needed and Depakote daily  Past Medical History:  Diagnosis Date  . Bipolar 1 disorder ([place])   . Chronic pain   . Depression   . Diabetes mellitus ([place])   . Hypertension   . Kidney stones   . Morbid obesity ([place])   . Obesity hypoventilation syndrome ([place])   . Obstructive sleep apnea   . Panniculitis     Patient Active Problem List   Diagnosis Date Noted  . Hypoxemia   . Pressure injury of skin 05/06/2018  . Acute on chronic  respiratory failure with hypoxemia ([place])   . Hypercarbia   . GI bleed 05/05/2018  . Hemorrhagic shock ([place]) 05/05/2018  . Acute blood loss anemia 05/05/2018  . Tracheostomy care ([place]) 05/05/2018  . OSA (obstructive sleep apnea) 05/05/2018  . DM2 (diabetes mellitus, type 2) ([place]) 05/05/2018  . Bipolar 1 disorder ([place]) 05/05/2018  . Chronic pain 05/05/2018  . Depression 05/05/2018  . Acute kidney injury ([place]) 05/05/2018  . Acute upper GI bleed   . Tracheostomy status ([place]) 02/17/2014  . Morbid obesity ([place]) 02/17/2014  . Chronic respiratory failure with hypoxia and hypercapnia ([place]) 01/23/2014  . Acute encephalopathy 01/23/2014    Past Surgical History:  Procedure Laterality Date  . ESOPHAGOGASTRODUODENOSCOPY N/A 05/06/2018   Procedure: ESOPHAGOGASTRODUODENOSCOPY (EGD);  Surgeon: Irving Copas., MD;  Location: [facility];  Service: Gastroenterology;  Laterality: N/A;  . RIGHT OOPHORECTOMY    . TRACHEOSTOMY    . VENTRAL HERNIA REPAIR       OB History   No obstetric history on file.     No family history on file.  Social History   Tobacco Use  . Smoking status: Never Smoker  . Smokeless tobacco: Never Used  Substance Use Topics  . Alcohol use: No  . Drug use: No    Home Medications Prior to Admission medications   Medication Sig Start Date End Date  Taking? Authorizing Provider  ARIPiprazole (ABILIFY) 5 MG tablet Take 1 tablet (5 mg total) by mouth daily. 09/13/18   Ward, Ozella Almond, PA-C  chlorhexidine (HIBICLENS) 4 % external liquid Apply 1 application topically daily. For bath    [provider]  Cholecalciferol 1000 units tablet Take 1,000 Units by mouth daily.    [provider]  diazepam (VALIUM) 5 MG tablet Take 5 mg by mouth as needed for anxiety or muscle spasms.    [provider]  divalproex (DEPAKOTE ER) 500 MG 24 hr tablet Take 2 tablets (1,000 mg total) by mouth every 12 (twelve) hours. 09/13/18   Ward, Ozella Almond, PA-C  ergocalciferol (VITAMIN D2) 50000 units capsule Take 50,000 Units by mouth once a week.     [provider]  Exenatide ER (BYDUREON) 2 MG PEN Inject 2 mg into the skin every Thursday.    [provider]  famotidine (PEPCID) 20 MG tablet Take 20 mg by mouth at bedtime.    [provider]  fluticasone (FLONASE) 50 MCG/ACT nasal spray Place 2 sprays into both nostrils daily.    [provider]  HYDROcodone-acetaminophen (NORCO/VICODIN) 5-325 MG tablet Take 1 tablet by mouth every 6 (six) hours as needed for moderate pain. 05/12/19   Carmin Muskrat, MD  insulin glargine (LANTUS) 100 UNIT/ML injection Inject 10-40 Units into the skin See admin instructions. Inject 10 units in the am and inject 40 at bedtime.    [provider]  levofloxacin (LEVAQUIN) 750 MG tablet Take 1 tablet (750 mg total) by mouth daily. 10/18/19   Noemi Chapel, MD  loratadine (CLARITIN) 10 MG tablet Take 10 mg by mouth daily.     [provider]  metoprolol tartrate (LOPRESSOR) 50 MG tablet Take 50 mg by mouth 2 (two) times daily.    [provider]  Multiple Vitamin (MULTIVITAMIN WITH MINERALS) TABS tablet Take 1 tablet by mouth daily.    [provider]  olopatadine (PATANOL) 0.1 % ophthalmic solution Place 1 drop into both eyes 2 (two) times daily.    [provider]  pantoprazole (PROTONIX) 40 MG tablet Take 40 mg by mouth daily.     [provider]  torsemide (DEMADEX) 20 MG tablet Take 20 mg by mouth daily.    [provider]  traZODone (DESYREL) 150 MG tablet Take 1 tablet (150 mg total) by mouth at bedtime. 09/13/18   Ward, Ozella Almond, PA-C  venlafaxine XR (EFFEXOR-XR) 75 MG 24 hr capsule Take 225 mg by mouth daily with breakfast.    [provider]    Allergies    Meperidine and related and Cefazolin  Review of Systems   Review of Systems  All other systems reviewed and are  negative.   Physical Exam Updated Vital Signs BP 100/64 Comment: SpO2 probe changed from finger to ear, new reading 100%.   Pulse (!) 52 Comment: SpO2 probe changed from finger to ear, new reading 100%.   Temp (!) 96.3 F (35.7 C) (Axillary)   Resp 20 Comment: SpO2 probe changed from finger to ear, new reading 100%.   SpO2 (!) 83% Comment: SpO2 probe changed from finger to ear, new reading 100%.   Physical Exam Vitals and nursing note reviewed.  Constitutional:      General: She is not in acute distress.    Appearance: She is well-developed.     Comments: The patient is morbidly obese, she awakens to her voice and follows commands  HENT:  Head: Normocephalic and atraumatic.     Mouth/Throat:     Pharynx: No oropharyngeal exudate.  Eyes:     General: No scleral icterus.       Right eye: No discharge.        Left eye: No discharge.     Conjunctiva/sclera: Conjunctivae normal.     Pupils: Pupils are equal, round, and reactive to light.  Neck:     Thyroid: No thyromegaly.     Vascular: No JVD.     Comments: Lurline Idol present patent and hooked up to ventilator Cardiovascular:     Rate and Rhythm: Normal rate and regular rhythm.     Heart sounds: Normal heart sounds. No murmur. No friction rub. No gallop.   Pulmonary:     Effort: Pulmonary effort is normal. No respiratory distress.     Breath sounds: Normal breath sounds. No wheezing or rales.     Comments: Lung sounds are clear, no rales, no wheezing, no increased work of breathing Abdominal:     General: Bowel sounds are normal. There is no distension.     Palpations: Abdomen is soft. There is no mass.     Tenderness: There is no abdominal tenderness.     Comments: Morbidly obese abdomen, nontender  Musculoskeletal:        General: No tenderness. Normal range of motion.     Cervical back: Normal range of motion and neck supple.     Comments: No deformities or tenderness of the extremities  Lymphadenopathy:     Cervical: No  cervical adenopathy.  Skin:    General: Skin is warm and dry.     Findings: No erythema or rash.  Neurological:     Mental Status: She is alert.     Coordination: Coordination normal.     Comments: The patient is able to move all 4 extremities, she lifts both arms, grips with both hands, able to move both legs though she is weak in the legs and cannot lift them off the bed.  Psychiatric:        Behavior: Behavior normal.     ED Results / Procedures / Treatments   Labs (all labs ordered are listed, but only abnormal results are displayed) Labs Reviewed  CBC WITH DIFFERENTIAL/PLATELET - Abnormal; Notable for the following components:      Result Value   RBC 3.86 (*)    Hemoglobin 10.8 (*)    MCHC 29.0 (*)    All other components within normal limits  COMPREHENSIVE METABOLIC PANEL - Abnormal; Notable for the following components:   Chloride 90 (*)    CO2 42 (*)    Glucose, Bld 127 (*)    Albumin 2.7 (*)    All other components within normal limits  URINALYSIS, ROUTINE W REFLEX MICROSCOPIC - Abnormal; Notable for the following components:   Color, Urine AMBER (*)    APPearance CLOUDY (*)    Hgb urine dipstick SMALL (*)    Bilirubin Urine SMALL (*)    Ketones, ur 5 (*)    Protein, ur 100 (*)    Leukocytes,Ua TRACE (*)    Bacteria, UA RARE (*)    All other components within normal limits  POCT I-STAT EG7 - Abnormal; Notable for the following components:   pH, Ven 7.635 (*)    pO2, Ven 77.0 (*)    Bicarbonate 48.3 (*)    TCO2 50 (*)    Acid-Base Excess 24.0 (*)    Calcium, Ion 1.09 (*)  HCT 30.0 (*)    Hemoglobin 10.2 (*)    All other components within normal limits  TROPONIN I (HIGH SENSITIVITY) - Abnormal; Notable for the following components:   Troponin I (High Sensitivity) 69 (*)    All other components within normal limits  RESPIRATORY PANEL BY RT PCR (FLU A&B, COVID)  URINE CULTURE  VALPROIC ACID LEVEL  BLOOD GAS, VENOUS  CBG MONITORING, ED     EKG None  Radiology DG Chest Port 1 View  Result Date: 10/18/2019 CLINICAL DATA:  Shortness of breath.  Hypoxia. EXAM: PORTABLE CHEST 1 VIEW COMPARISON:  May 18, 2019 FINDINGS: Tracheostomy tube remains in place. No pneumothorax. The left lung is clear. The cardiomediastinal silhouette is stable. There is haziness over the right base which may be due to patient rotation and overlapping soft tissues. IMPRESSION: Haziness over the right base may be due to overlapping soft tissues and patient rotation. A subtle developing infiltrate is considered less likely. A PA and lateral chest x-ray may better evaluate. Electronically Signed   By: Dorise Bullion III M.D   On: 10/18/2019 20:21    Procedures Procedures (including critical care time)  Medications Ordered in ED Medications  levofloxacin (LEVAQUIN) tablet 750 mg (has no administration in time range)    ED Course  I have reviewed the triage vital signs and the nursing notes.  Pertinent labs & imaging results that were available during my care of the patient were reviewed by me and considered in my medical decision making (see chart for details).    MDM Rules/Calculators/A&P                       This patient is generally chronically ill with obesity hypoventilation requiring tracheostomy and ventilatory support.  At this time the patient will need further evaluation for altered mental status.  Will obtain Depakote level, CT scan of the brain, basic labs and a chest x-ray to make sure she has not aspirated.  That being said she looks well at this time with vital signs which are reassuring  11:12 PM Cardiac monitoring reveals normal sinus rhythm, rate of 80 (Rate & rhythm), as reviewed and interpreted by me. Cardiac monitoring was ordered due to altered mental status and to monitor patient for dysrhythmia.  I have reevaluated the patient, she now reports that she does feel little bit short of breath.  The x-ray does show a possible  pneumonia.  Her pH shows that she is not acidotic in fact her VBG showed seems to be fairly normal.  On exam the patient has decreased lung sounds but is because of her soft tissues which makes it difficult to listen.  She is oxygenating just fine on the ventilator.  She does not have a leukocytosis, she is Covid negative, her urinalysis reveals 11-20 white blood cells but rare bacteria suggestive that this is not related to a urinary tract infection.  Depakote level is normal, she has not had any seizure activity here and has a normal mental status.  Her metabolic panel is reassuring.  I will treat her for possible recurrent pneumonia with Levaquin, the patient is agreeable to this, first dose given in the emergency department.  The patient is stable for discharge, I do not think she needs a CT scan of the brain to rule out any other problems at this time  Kim Jenkins was evaluated in Emergency Department on 10/18/2019 for the symptoms described in the history of present illness.  She was evaluated in the context of the global COVID-19 pandemic, which necessitated consideration that the patient might be at risk for infection with the SARS-CoV-2 virus that causes COVID-19. Institutional protocols and algorithms that pertain to the evaluation of patients at risk for COVID-19 are in a state of rapid change based on information released by regulatory bodies including the CDC and federal and state organizations. These policies and algorithms were followed during the patient's care in the ED.  Marland Kitchen  Final Clinical Impression(s) / ED Diagnoses Final diagnoses:  Shortness of breath    Rx / DC Orders ED Discharge Orders         Ordered    levofloxacin (LEVAQUIN) 750 MG tablet  Daily     10/18/19 2311           Noemi Chapel, MD 10/18/19 2312

## 2019-10-18 NOTE — Discharge Instructions (Signed)
Your testing shows that she may have a very early small pneumonia but otherwise your blood work was totally normal.  Your vital signs have been good, your oxygen level is good, please start taking Levaquin once a day for the next 7 days.  Have your doctor at your facility recheck you within 24 hours.  You should probably be using the ventilator for the next 24 hours as well, please return to the emergency department for severe or worsening symptoms, have your family doctor recheck you tomorrow

## 2019-10-18 NOTE — ED Notes (Signed)
Urinary drainage bag changed out to obtain accurate UA and Culture from fresh bag. Drainage bag initialed and dated at time of replacement.

## 2019-10-18 NOTE — ED Triage Notes (Signed)
Pt from [facility]. Pt is obese, and trach dependent. hxt of DM.   Pt here for seizure like activity. Postictal symptoms. Pt appears alert upon arrival.

## 2019-10-19 DIAGNOSIS — R0602 Shortness of breath: Secondary | ICD-10-CM | POA: Diagnosis not present

## 2019-10-19 NOTE — ED Notes (Signed)
Just removed IV from Right forearm

## 2019-10-19 NOTE — ED Notes (Signed)
CALLED [facility] FOR TRANSPORT TO [facility]--Kim Jenkins

## 2019-10-21 LAB — URINE CULTURE: Culture: >=100000 — AB

## 2019-10-22 ENCOUNTER — Telehealth: Payer: Self-pay

## 2019-10-22 NOTE — Telephone Encounter (Signed)
UC from ED 10/19/2019 faxed to [facility] at [phone]

## 2019-10-22 NOTE — Progress Notes (Signed)
ED Antimicrobial Stewardship Positive Culture Follow Up   Kim Jenkins is an 54 y.o. female who presented to [facility] on 10/18/2019 with a chief complaint of  Chief Complaint  Patient presents with  . Seizures    Recent Results (from the past 720 hour(s))  Respiratory Panel by RT PCR (Flu A&B, Covid) - Nasopharyngeal Swab     Status: None   Collection Time: 10/18/19  8:53 PM   Specimen: Nasopharyngeal Swab  Result Value Ref Range Status   SARS Coronavirus 2 by RT PCR NEGATIVE NEGATIVE Final    Comment: (NOTE) SARS-CoV-2 target nucleic acids are NOT DETECTED. The SARS-CoV-2 RNA is generally detectable in upper respiratoy specimens during the acute phase of infection. The lowest concentration of SARS-CoV-2 viral copies this assay can detect is 131 copies/mL. A negative result does not preclude SARS-Cov-2 infection and should not be used as the sole basis for treatment or other patient management decisions. A negative result may occur with  improper specimen collection/handling, submission of specimen other than nasopharyngeal swab, presence of viral mutation(s) within the areas targeted by this assay, and inadequate number of viral copies (<131 copies/mL). A negative result must be combined with clinical observations, patient history, and epidemiological information. The expected result is Negative. Fact Sheet for Patients:  PinkCheek.be Fact Sheet for Healthcare Providers:  GravelBags.it This test is not yet ap proved or cleared by the [place] FDA and  has been authorized for detection and/or diagnosis of SARS-CoV-2 by FDA under an Emergency Use Authorization (EUA). This EUA will remain  in effect (meaning this test can be used) for the duration of the COVID-19 declaration under Section 564(b)(1) of the Act, 21 U.S.C. section 360bbb-3(b)(1), unless the authorization is terminated or revoked sooner.    Influenza A by  PCR NEGATIVE NEGATIVE Final   Influenza B by PCR NEGATIVE NEGATIVE Final    Comment: (NOTE) The Xpert Xpress SARS-CoV-2/FLU/RSV assay is intended as an aid in  the diagnosis of influenza from Nasopharyngeal swab specimens and  should not be used as a sole basis for treatment. Nasal washings and  aspirates are unacceptable for Xpert Xpress SARS-CoV-2/FLU/RSV  testing. Fact Sheet for Patients: PinkCheek.be Fact Sheet for Healthcare Providers: GravelBags.it This test is not yet approved or cleared by the [place] FDA and  has been authorized for detection and/or diagnosis of SARS-CoV-2 by  FDA under an Emergency Use Authorization (EUA). This EUA will remain  in effect (meaning this test can be used) for the duration of the  Covid-19 declaration under Section 564(b)(1) of the Act, 21  U.S.C. section 360bbb-3(b)(1), unless the authorization is  terminated or revoked. Performed at [facility], [place] [address], [place], [place] [address]   Urine Culture     Status: Abnormal   Collection Time: 10/18/19  9:26 PM   Specimen: Urine, Catheterized  Result Value Ref Range Status   Specimen Description URINE, CATHETERIZED  Final   Special Requests   Final    NONE Performed at [facility], [place] [address], [place], [place] [address]    Culture >=100,000 COLONIES/mL ESCHERICHIA COLI (A)  Final   Report Status 10/21/2019 FINAL  Final   Organism ID, Bacteria ESCHERICHIA COLI (A)  Final      Susceptibility   Escherichia coli - MIC*    AMPICILLIN >=32 RESISTANT Resistant     CEFAZOLIN <=4 SENSITIVE Sensitive     CEFTRIAXONE <=0.25 SENSITIVE Sensitive     CIPROFLOXACIN >=4 RESISTANT  Resistant     GENTAMICIN <=1 SENSITIVE Sensitive     IMIPENEM <=0.25 SENSITIVE Sensitive     NITROFURANTOIN <=16 SENSITIVE Sensitive     TRIMETH/SULFA >=320 RESISTANT Resistant     AMPICILLIN/SULBACTAM >=32 RESISTANT Resistant     PIP/TAZO  <=4 SENSITIVE Sensitive     * >=100,000 COLONIES/mL ESCHERICHIA COLI    []  Treated with N/A, organism resistant to prescribed antimicrobial [x]  Patient discharged originally without antimicrobial agent and treatment is now indicated  New antibiotic prescription: If pt with UTI symptoms, start macrobid 100mg  PO BID x 7 days  ED Provider: Irena Cords, PA-C   [place], Rande Lawman 10/22/2019, 8:12 AM Clinical Pharmacist Monday - Friday phone -  [phone] Saturday - Sunday phone - [phone]

## 2019-12-04 ENCOUNTER — Emergency Department (HOSPITAL_COMMUNITY): Payer: Medicare Other

## 2019-12-04 ENCOUNTER — Encounter (HOSPITAL_COMMUNITY): Payer: Self-pay

## 2019-12-04 ENCOUNTER — Other Ambulatory Visit: Payer: Self-pay

## 2019-12-04 ENCOUNTER — Inpatient Hospital Stay (HOSPITAL_COMMUNITY)
Admission: EM | Admit: 2019-12-04 | Discharge: 2019-12-09 | DRG: 207 | Disposition: A | Discharge status: Other Acute Inpatient Hospital | Payer: Medicare Other | Source: Other Acute Inpatient Hospital | Attending: Pulmonary Disease | Admitting: Pulmonary Disease

## 2019-12-04 DIAGNOSIS — Z86718 Personal history of other venous thrombosis and embolism: Secondary | ICD-10-CM | POA: Diagnosis not present

## 2019-12-04 DIAGNOSIS — J9690 Respiratory failure, unspecified, unspecified whether with hypoxia or hypercapnia: Secondary | ICD-10-CM | POA: Diagnosis present

## 2019-12-04 DIAGNOSIS — I2699 Other pulmonary embolism without acute cor pulmonale: Secondary | ICD-10-CM

## 2019-12-04 DIAGNOSIS — J9612 Chronic respiratory failure with hypercapnia: Secondary | ICD-10-CM | POA: Diagnosis not present

## 2019-12-04 DIAGNOSIS — E1165 Type 2 diabetes mellitus with hyperglycemia: Secondary | ICD-10-CM | POA: Diagnosis present

## 2019-12-04 DIAGNOSIS — I110 Hypertensive heart disease with heart failure: Secondary | ICD-10-CM | POA: Diagnosis present

## 2019-12-04 DIAGNOSIS — J9622 Acute and chronic respiratory failure with hypercapnia: Secondary | ICD-10-CM | POA: Diagnosis present

## 2019-12-04 DIAGNOSIS — Z20822 Contact with and (suspected) exposure to covid-19: Secondary | ICD-10-CM | POA: Diagnosis present

## 2019-12-04 DIAGNOSIS — J690 Pneumonitis due to inhalation of food and vomit: Secondary | ICD-10-CM | POA: Diagnosis present

## 2019-12-04 DIAGNOSIS — J9621 Acute and chronic respiratory failure with hypoxia: Secondary | ICD-10-CM | POA: Diagnosis present

## 2019-12-04 DIAGNOSIS — D638 Anemia in other chronic diseases classified elsewhere: Secondary | ICD-10-CM | POA: Diagnosis present

## 2019-12-04 DIAGNOSIS — G9341 Metabolic encephalopathy: Secondary | ICD-10-CM | POA: Diagnosis present

## 2019-12-04 DIAGNOSIS — I5032 Chronic diastolic (congestive) heart failure: Secondary | ICD-10-CM | POA: Diagnosis present

## 2019-12-04 DIAGNOSIS — Y848 Other medical procedures as the cause of abnormal reaction of the patient, or of later complication, without mention of misadventure at the time of the procedure: Secondary | ICD-10-CM | POA: Diagnosis present

## 2019-12-04 DIAGNOSIS — Z794 Long term (current) use of insulin: Secondary | ICD-10-CM

## 2019-12-04 DIAGNOSIS — Z90721 Acquired absence of ovaries, unilateral: Secondary | ICD-10-CM | POA: Diagnosis not present

## 2019-12-04 DIAGNOSIS — J95851 Ventilator associated pneumonia: Secondary | ICD-10-CM | POA: Diagnosis present

## 2019-12-04 DIAGNOSIS — R609 Edema, unspecified: Secondary | ICD-10-CM | POA: Diagnosis not present

## 2019-12-04 DIAGNOSIS — R079 Chest pain, unspecified: Secondary | ICD-10-CM | POA: Diagnosis not present

## 2019-12-04 DIAGNOSIS — Z885 Allergy status to narcotic agent status: Secondary | ICD-10-CM

## 2019-12-04 DIAGNOSIS — Z6841 Body Mass Index (BMI) 40.0 and over, adult: Secondary | ICD-10-CM

## 2019-12-04 DIAGNOSIS — R0602 Shortness of breath: Secondary | ICD-10-CM | POA: Diagnosis not present

## 2019-12-04 DIAGNOSIS — F319 Bipolar disorder, unspecified: Secondary | ICD-10-CM | POA: Diagnosis present

## 2019-12-04 DIAGNOSIS — G8929 Other chronic pain: Secondary | ICD-10-CM | POA: Diagnosis present

## 2019-12-04 DIAGNOSIS — J156 Pneumonia due to other aerobic Gram-negative bacteria: Secondary | ICD-10-CM | POA: Diagnosis present

## 2019-12-04 DIAGNOSIS — M793 Panniculitis, unspecified: Secondary | ICD-10-CM | POA: Diagnosis present

## 2019-12-04 DIAGNOSIS — I2694 Multiple subsegmental pulmonary emboli without acute cor pulmonale: Principal | ICD-10-CM | POA: Diagnosis present

## 2019-12-04 DIAGNOSIS — Z9911 Dependence on respirator [ventilator] status: Secondary | ICD-10-CM | POA: Diagnosis not present

## 2019-12-04 DIAGNOSIS — Z930 Tracheostomy status: Secondary | ICD-10-CM

## 2019-12-04 DIAGNOSIS — Z79899 Other long term (current) drug therapy: Secondary | ICD-10-CM

## 2019-12-04 DIAGNOSIS — J9811 Atelectasis: Secondary | ICD-10-CM | POA: Diagnosis present

## 2019-12-04 DIAGNOSIS — J9601 Acute respiratory failure with hypoxia: Secondary | ICD-10-CM | POA: Diagnosis not present

## 2019-12-04 DIAGNOSIS — Z87442 Personal history of urinary calculi: Secondary | ICD-10-CM

## 2019-12-04 DIAGNOSIS — J9611 Chronic respiratory failure with hypoxia: Secondary | ICD-10-CM

## 2019-12-04 DIAGNOSIS — Z79891 Long term (current) use of opiate analgesic: Secondary | ICD-10-CM

## 2019-12-04 DIAGNOSIS — R110 Nausea: Secondary | ICD-10-CM | POA: Diagnosis not present

## 2019-12-04 DIAGNOSIS — Z881 Allergy status to other antibiotic agents status: Secondary | ICD-10-CM

## 2019-12-04 LAB — CBC
HCT: 34.8 % — ABNORMAL LOW (ref 36.0–46.0)
Hemoglobin: 10.3 g/dL — ABNORMAL LOW (ref 12.0–15.0)
MCH: 27 pg (ref 26.0–34.0)
MCHC: 29.6 g/dL — ABNORMAL LOW (ref 30.0–36.0)
MCV: 91.3 fL (ref 80.0–100.0)
Platelets: 167 10*3/uL (ref 150–400)
RBC: 3.81 MIL/uL — ABNORMAL LOW (ref 3.87–5.11)
RDW: 14.4 % (ref 11.5–15.5)
WBC: 7.1 10*3/uL (ref 4.0–10.5)
nRBC: 0 % (ref 0.0–0.2)

## 2019-12-04 LAB — HEPATIC FUNCTION PANEL
ALT: 13 U/L (ref 0–44)
AST: 19 U/L (ref 15–41)
Albumin: 2.9 g/dL — ABNORMAL LOW (ref 3.5–5.0)
Alkaline Phosphatase: 48 U/L (ref 38–126)
Bilirubin, Direct: 0.1 mg/dL (ref 0.0–0.2)
Indirect Bilirubin: 0.3 mg/dL (ref 0.3–0.9)
Total Bilirubin: 0.4 mg/dL (ref 0.3–1.2)
Total Protein: 7.6 g/dL (ref 6.5–8.1)

## 2019-12-04 LAB — BASIC METABOLIC PANEL
Anion gap: 8 (ref 5–15)
BUN: 17 mg/dL (ref 6–20)
CO2: 44 mmol/L — ABNORMAL HIGH (ref 22–32)
Calcium: 9.5 mg/dL (ref 8.9–10.3)
Chloride: 89 mmol/L — ABNORMAL LOW (ref 98–111)
Creatinine, Ser: 0.61 mg/dL (ref 0.44–1.00)
GFR calc Af Amer: >60 mL/min (ref >60)
GFR calc non Af Amer: >60 mL/min (ref >60)
Glucose, Bld: 152 mg/dL — ABNORMAL HIGH (ref 70–99)
Potassium: 4.4 mmol/L (ref 3.5–5.1)
Sodium: 141 mmol/L (ref 135–145)

## 2019-12-04 LAB — D-DIMER, QUANTITATIVE: D-Dimer, Quant: 2.15 ug/mL-FEU — ABNORMAL HIGH (ref 0.00–0.50)

## 2019-12-04 LAB — RESPIRATORY PANEL BY RT PCR (FLU A&B, COVID)
Influenza A by PCR: NEGATIVE
Influenza B by PCR: NEGATIVE
SARS Coronavirus 2 by RT PCR: NEGATIVE

## 2019-12-04 LAB — TROPONIN I (HIGH SENSITIVITY)
Troponin I (High Sensitivity): 4 ng/L (ref <18)
Troponin I (High Sensitivity): 4 ng/L (ref <18)

## 2019-12-04 LAB — I-STAT BETA HCG BLOOD, ED (MC, WL, AP ONLY): I-stat hCG, quantitative: <5 m[IU]/mL (ref <5)

## 2019-12-04 MED ORDER — PIPERACILLIN-TAZOBACTAM 3.375 G IVPB
3.3750 g | Freq: Three times a day (TID) | INTRAVENOUS | Status: DC
  Administered 2019-12-05 – 2019-12-07 (×7): 3.375 g via INTRAVENOUS
  Filled 2019-12-04 (×7): qty 50

## 2019-12-04 MED ORDER — MIDAZOLAM HCL 2 MG/2ML IJ SOLN: 2.0000 mg | INTRAMUSCULAR | Status: DC | PRN

## 2019-12-04 MED ORDER — CHLORHEXIDINE GLUCONATE 0.12% ORAL RINSE (MEDLINE KIT)
15.0000 mL | Freq: Two times a day (BID) | OROMUCOSAL | Status: DC
  Administered 2019-12-05 – 2019-12-08 (×6): 15 mL via OROMUCOSAL

## 2019-12-04 MED ORDER — FENTANYL CITRATE (PF) 100 MCG/2ML IJ SOLN
50.0000 ug | INTRAMUSCULAR | Status: DC | PRN
  Administered 2019-12-05 (×3): 100 ug via INTRAVENOUS
  Administered 2019-12-05: 13:00:00 50 ug via INTRAVENOUS
  Administered 2019-12-05 (×6): 100 ug via INTRAVENOUS
  Administered 2019-12-06: 50 ug via INTRAVENOUS
  Administered 2019-12-06 (×2): 100 ug via INTRAVENOUS
  Administered 2019-12-06: 50 ug via INTRAVENOUS
  Filled 2019-12-04 (×14): qty 2

## 2019-12-04 MED ORDER — PANTOPRAZOLE SODIUM 40 MG IV SOLR
40.0000 mg | Freq: Every day | INTRAVENOUS | Status: DC
  Administered 2019-12-05 – 2019-12-06 (×2): 40 mg via INTRAVENOUS
  Filled 2019-12-04 (×2): qty 40

## 2019-12-04 MED ORDER — ORAL CARE MOUTH RINSE
15.0000 mL | OROMUCOSAL | Status: DC
  Administered 2019-12-05 – 2019-12-09 (×35): 15 mL via OROMUCOSAL

## 2019-12-04 MED ORDER — SODIUM CHLORIDE 0.9% FLUSH: 3.0000 mL | Freq: Once | INTRAVENOUS | Status: DC

## 2019-12-04 MED ORDER — SODIUM CHLORIDE 0.9 % IV SOLN: INTRAVENOUS | Status: DC

## 2019-12-04 MED ORDER — LIDOCAINE VISCOUS HCL 2 % MT SOLN
15.0000 mL | Freq: Once | OROMUCOSAL | Status: AC
  Administered 2019-12-04: 15 mL via ORAL
  Filled 2019-12-04: qty 15

## 2019-12-04 MED ORDER — HEPARIN (PORCINE) 25000 UT/250ML-% IV SOLN
1450.0000 [IU]/h | INTRAVENOUS | Status: DC
  Administered 2019-12-05 (×2): 1700 [IU]/h via INTRAVENOUS
  Filled 2019-12-04 (×3): qty 250

## 2019-12-04 MED ORDER — INSULIN ASPART 100 UNIT/ML ~~LOC~~ SOLN
0.0000 [IU] | SUBCUTANEOUS | Status: DC
  Administered 2019-12-06 (×3): 3 [IU] via SUBCUTANEOUS
  Administered 2019-12-07: 4 [IU] via SUBCUTANEOUS
  Administered 2019-12-07 (×2): 3 [IU] via SUBCUTANEOUS
  Administered 2019-12-07: 08:00:00 4 [IU] via SUBCUTANEOUS

## 2019-12-04 MED ORDER — IOHEXOL 350 MG/ML SOLN
80.0000 mL | Freq: Once | INTRAVENOUS | Status: AC | PRN
  Administered 2019-12-04: 80 mL via INTRAVENOUS

## 2019-12-04 MED ORDER — MICONAZOLE NITRATE POWD
Freq: Two times a day (BID) | Status: DC
  Filled 2019-12-04: qty 100

## 2019-12-04 MED ORDER — ENOXAPARIN SODIUM 300 MG/3ML IJ SOLN
1.0000 mg/kg | Freq: Two times a day (BID) | INTRAMUSCULAR | Status: DC
  Administered 2019-12-04: 165 mg via SUBCUTANEOUS
  Filled 2019-12-04: qty 1.65

## 2019-12-04 MED ORDER — HYDROCODONE-ACETAMINOPHEN 5-325 MG PO TABS
1.0000 | ORAL_TABLET | Freq: Once | ORAL | Status: AC
  Administered 2019-12-04: 1 via ORAL
  Filled 2019-12-04: qty 1

## 2019-12-04 MED ORDER — PIPERACILLIN-TAZOBACTAM 3.375 G IVPB 30 MIN
3.3750 g | Freq: Once | INTRAVENOUS | Status: AC
  Administered 2019-12-04: 3.375 g via INTRAVENOUS
  Filled 2019-12-04: qty 50

## 2019-12-04 MED ORDER — ALUM & MAG HYDROXIDE-SIMETH 200-200-20 MG/5ML PO SUSP
30.0000 mL | Freq: Once | ORAL | Status: AC
  Administered 2019-12-04: 30 mL via ORAL
  Filled 2019-12-04: qty 30

## 2019-12-04 NOTE — Progress Notes (Addendum)
ANTICOAGULATION CONSULT NOTE - Initial Consult  Pharmacy Consult for lovenox Indication: pulmonary embolus  Allergies  Allergen Reactions  . Meperidine And Related Shortness Of Breath  . Cefazolin Other (See Comments)    Per MAR    Patient Measurements: Height: 5\' 6"  (167.6 cm) Weight: (!) 366 lb (166 kg)(last documented weight) IBW/kg (Calculated) : 59.3  Vital Signs: Temp: 98.4 F (36.9 C) (03/18 1628) BP: 105/67 (03/18 2134) Pulse Rate: 71 (03/18 2134)  Labs: Recent Labs    12/04/19 1633 12/04/19 1950  HGB 10.3*  --   HCT 34.8*  --   PLT 167  --   CREATININE 0.61  --   TROPONINIHS 4 4    Estimated Creatinine Clearance: 127.9 mL/min (by C-G formula based on SCr of 0.61 mg/dL).   Medical History: Past Medical History:  Diagnosis Date  . Bipolar 1 disorder ([place])   . Chronic pain   . Depression   . Diabetes mellitus ([place])   . Hypertension   . Kidney stones   . Morbid obesity ([place])   . Obesity hypoventilation syndrome ([place])   . Obstructive sleep apnea   . Panniculitis     Assessment: 49 yof presented to the ED with CP from [facility]. Found to have a PE and is now starting lovenox. Baseline Hgb is slightly low and platelets are WNL. Will require close monitoring due to patient size.   Goal of Therapy:  Anti-Xa level 0.6-1 units/ml 4hrs after LMWH dose given Monitor platelets by anticoagulation protocol: Yes   Plan:  Lovenox 1mg /kg SQ Q12H CBC Q72H  Endre Coutts, Rande Lawman 12/04/2019,10:09 PM   Addendum: Now changing to IV heparin. Pt already received a dose of lovenox, will start heparin gtt 1700 units/hr tomorrow at 1130. Will check a 6 hr heparin level, daily heparin level and CBC  Salome Arnt, PharmD, BCPS Clinical Pharmacist Please see AMION for all pharmacy numbers 12/04/2019 10:45 PM

## 2019-12-04 NOTE — ED Provider Notes (Signed)
[facility] EMERGENCY DEPARTMENT Provider Note   CSN: [id number] Arrival date & time: 12/04/19  1622     History Chief Complaint  Patient presents with  . Chest Pain  . Shortness of Breath    Eeva Schlosser is a 54 y.o. female.  The history is provided by the patient, medical records and the nursing home. No language interpreter was used.   Amie Cowens is a 54 y.o. female who presents to the Emergency Department complaining of chest pain.  She presents to the ED by EMS from [facility].  She complains of central chest pain.  Pain radiates to her left arm. Pain began this morning around 8 AM. Denies fevers, cough, abdominal pain, nausea, vomiting.  She is sob, worse than baseline. She was recently treated with meropenem for pneumonia, completed course in early March. She has a tracheostomy and is vent dependent at night. She currently resides in the skilled nursing home section of [facility]. Denies any prior similar symptoms.    Past Medical History:  Diagnosis Date  . Bipolar 1 disorder ([place])   . Chronic pain   . Depression   . Diabetes mellitus ([place])   . Hypertension   . Kidney stones   . Morbid obesity ([place])   . Obesity hypoventilation syndrome ([place])   . Obstructive sleep apnea   . Panniculitis     Patient Active Problem List   Diagnosis Date Noted  . Acute pulmonary embolism ([place]) 12/04/2019  . Hypoxemia   . Pressure injury of skin 05/06/2018  . Acute on chronic respiratory failure with hypoxemia ([place])   . Hypercarbia   . GI bleed 05/05/2018  . Hemorrhagic shock ([place]) 05/05/2018  . Acute blood loss anemia 05/05/2018  . Tracheostomy care ([place]) 05/05/2018  . OSA (obstructive sleep apnea) 05/05/2018  . DM2 (diabetes mellitus, type 2) ([place]) 05/05/2018  . Bipolar 1 disorder ([place]) 05/05/2018  . Chronic pain 05/05/2018  . Depression 05/05/2018  . Acute kidney injury ([place]) 05/05/2018  . Acute upper GI bleed   . Tracheostomy status ([place]) 02/17/2014  .  Morbid obesity ([place]) 02/17/2014  . Chronic respiratory failure with hypoxia and hypercapnia ([place]) 01/23/2014  . Acute encephalopathy 01/23/2014    Past Surgical History:  Procedure Laterality Date  . ESOPHAGOGASTRODUODENOSCOPY N/A 05/06/2018   Procedure: ESOPHAGOGASTRODUODENOSCOPY (EGD);  Surgeon: Irving Copas., MD;  Location: [place];  Service: Gastroenterology;  Laterality: N/A;  . RIGHT OOPHORECTOMY    . TRACHEOSTOMY    . VENTRAL HERNIA REPAIR       OB History   No obstetric history on file.     History reviewed. No pertinent family history.  Social History   Tobacco Use  . Smoking status: Never Smoker  . Smokeless tobacco: Never Used  Substance Use Topics  . Alcohol use: No  . Drug use: No    Home Medications Prior to Admission medications   Medication Sig Start Date End Date Taking? Authorizing Provider  ARIPiprazole (ABILIFY) 5 MG tablet Take 1 tablet (5 mg total) by mouth daily. 09/13/18   Ward, Ozella Almond, PA-C  chlorhexidine (HIBICLENS) 4 % external liquid Apply 1 application topically daily. For bath    [provider]  Cholecalciferol 1000 units tablet Take 1,000 Units by mouth daily.    [provider]  diazepam (VALIUM) 5 MG tablet Take 5 mg by mouth as needed for anxiety or muscle spasms.    [provider]  divalproex (DEPAKOTE ER) 500 MG 24 hr  tablet Take 2 tablets (1,000 mg total) by mouth every 12 (twelve) hours. 09/13/18   Ward, Ozella Almond, PA-C  ergocalciferol (VITAMIN D2) 50000 units capsule Take 50,000 Units by mouth once a week.     [provider]  Exenatide ER (BYDUREON) 2 MG PEN Inject 2 mg into the skin every Thursday.    [provider]  famotidine (PEPCID) 20 MG tablet Take 20 mg by mouth at bedtime.    [provider]  fluticasone (FLONASE) 50 MCG/ACT nasal spray Place 2 sprays into both nostrils daily.    [provider]  HYDROcodone-acetaminophen  (NORCO/VICODIN) 5-325 MG tablet Take 1 tablet by mouth every 6 (six) hours as needed for moderate pain. 05/12/19   Carmin Muskrat, MD  insulin glargine (LANTUS) 100 UNIT/ML injection Inject 10-40 Units into the skin See admin instructions. Inject 10 units in the am and inject 40 at bedtime.    [provider]  loratadine (CLARITIN) 10 MG tablet Take 10 mg by mouth daily.     [provider]  metoprolol tartrate (LOPRESSOR) 50 MG tablet Take 50 mg by mouth 2 (two) times daily.    [provider]  Multiple Vitamin (MULTIVITAMIN WITH MINERALS) TABS tablet Take 1 tablet by mouth daily.    [provider]  olopatadine (PATANOL) 0.1 % ophthalmic solution Place 1 drop into both eyes 2 (two) times daily.    [provider]  pantoprazole (PROTONIX) 40 MG tablet Take 40 mg by mouth daily.     [provider]  torsemide (DEMADEX) 20 MG tablet Take 20 mg by mouth daily.    [provider]  traZODone (DESYREL) 150 MG tablet Take 1 tablet (150 mg total) by mouth at bedtime. 09/13/18   Ward, Ozella Almond, PA-C  venlafaxine XR (EFFEXOR-XR) 75 MG 24 hr capsule Take 225 mg by mouth daily with breakfast.    [provider]    Allergies    Meperidine and related and Cefazolin  Review of Systems   Review of Systems  All other systems reviewed and are negative.   Physical Exam Updated Vital Signs BP 95/67   Pulse 66   Temp 98.4 F (36.9 C)   Resp 12   Ht '5\' 6"'$  (1.676 m)   Wt (!) 166 kg Comment: last documented weight  SpO2 95%   BMI 59.07 kg/m   Physical Exam Vitals and nursing note reviewed.  Constitutional:      Appearance: She is well-developed.  HENT:     Head: Normocephalic and atraumatic.  Neck:     Comments: 6-0 core track in anterior neck with PMV in place Cardiovascular:     Rate and Rhythm: Normal rate and regular rhythm.     Heart sounds: No murmur.  Pulmonary:     Effort: Pulmonary effort is normal. No  respiratory distress.     Comments: Distant heart sounds Abdominal:     Palpations: Abdomen is soft.     Tenderness: There is no abdominal tenderness. There is no guarding or rebound.  Musculoskeletal:        General: No tenderness.     Comments: Trace edema to bilateral lower extremities  Skin:    General: Skin is warm and dry.  Neurological:     Mental Status: She is alert and oriented to person, place, and time.  Psychiatric:        Behavior: Behavior normal.     ED Results / Procedures / Treatments   Labs (  all labs ordered are listed, but only abnormal results are displayed) Labs Reviewed  BASIC METABOLIC PANEL - Abnormal; Notable for the following components:      Result Value   Chloride 89 (*)    CO2 44 (*)    Glucose, Bld 152 (*)    All other components within normal limits  CBC - Abnormal; Notable for the following components:   RBC 3.81 (*)    Hemoglobin 10.3 (*)    HCT 34.8 (*)    MCHC 29.6 (*)    All other components within normal limits  HEPATIC FUNCTION PANEL - Abnormal; Notable for the following components:   Albumin 2.9 (*)    All other components within normal limits  D-DIMER, QUANTITATIVE (NOT AT [facility]) - Abnormal; Notable for the following components:   D-Dimer, Quant 2.15 (*)    All other components within normal limits  RESPIRATORY PANEL BY RT PCR (FLU A&B, COVID)  CULTURE, RESPIRATORY  MRSA PCR SCREENING  HEMOGLOBIN A1C  CBC  COMPREHENSIVE METABOLIC PANEL  MAGNESIUM  HEPARIN LEVEL (UNFRACTIONATED)  I-STAT BETA HCG BLOOD, ED (MC, [facility], AP ONLY)  TROPONIN I (HIGH SENSITIVITY)  TROPONIN I (HIGH SENSITIVITY)    EKG EKG Interpretation  Date/Time:  Thursday December 04 2019 16:26:54 EDT Ventricular Rate:  82 PR Interval:    QRS Duration: 86 QT Interval:  381 QTC Calculation: 445 R Axis:   76 Text Interpretation: Sinus rhythm Low voltage, precordial leads Confirmed by Quintella Reichert [phone]) on 12/04/2019 4:43:27 PM   Radiology CT Angio Chest  PE W/Cm &/Or Wo Cm  Result Date: 12/04/2019 CLINICAL DATA:  Dyspnea and chest pain EXAM: CT ANGIOGRAPHY CHEST WITH CONTRAST TECHNIQUE: Multidetector CT imaging of the chest was performed using the standard protocol during bolus administration of intravenous contrast. Multiplanar CT image reconstructions and MIPs were obtained to evaluate the vascular anatomy. Automatic exposure control utilized. CONTRAST:  42m OMNIPAQUE IOHEXOL 350 MG/ML SOLN COMPARISON:  September 29, 2012 report. FINDINGS: Cardiovascular: Pulmonary artery hypertension, the pulmonary trunk measuring 3.4 cm diameter. Bilateral segmental nonocclusive pulmonary emboli. No central pulmonary embolism. No right heart strain. Thoracic aorta calcified atherosclerosis and valvular calcification without aneurysmal dilatation. An ectatic 3.0 cm tubular aorta. Focal moderate calcification of the right coronary artery. Mild three-vessel coronary calcification. A right peripherally inserted central catheter, its tip in the mid superior vena cava. Mediastinum/Nodes: A number of small nonspecific noncalcified mediastinal and bilateral hilar lymph nodes. Small amount of 4 Hounsfield unit pericardial recess fluid. Lungs/Pleura: Small right pleural effusion. Bibasilar subpleural consolidation. Aspiration in the distal trachea, right mainstem bronchus, bronchus intermedius, and right infrahilar endobronchial tree. Mild centrilobular emphysema. A tracheostomy tube within the tracheal airway, its tip 7 cm above the carina. Upper Abdomen: Reflux of contrast material into the hepatic veins. Mild hepatomegaly. Musculoskeletal: Morbid obesity; portions of the bilateral chest wall are not imaged / evaluated. Healed right 7th and 8th rib fracture deformities. Review of the MIP images confirms the above findings. I discussed critical results by telephone at the time of interpretation on 12/04/2019 at 9:46 pm with provider [facility], who verbally acknowledged these  results. IMPRESSION: Bilateral segmental nonocclusive pulmonary emboli. No evidence of right heart strain. Tracheostomy with aspiration in the distal trachea, right mainstem bronchus, and right infrahilar endobronchial tree. Suctioning recommended. Small right pleural effusion and bibasilar subpleural consolidation more pronounced on the right, possibly atelectatic or a developing aspiration pneumonitis on the right. Probably atelectatic on the left. Pulmonary artery hypertension and evidence of  right heart dysfunction. Focal moderate RCA calcification. Additional mild three-vessel coronary calcification. Morbid obesity with portions of the bilateral chest wall not imaged /evaluated. Aortic Atherosclerosis (ICD10-I70.0) and Emphysema (ICD10-J43.9). Mild hepatomegaly. Electronically Signed   By: Revonda Humphrey   On: 12/04/2019 21:50   DG Chest Port 1 View  Result Date: 12/04/2019 CLINICAL DATA:  Chest pain and shortness of breath EXAM: PORTABLE CHEST 1 VIEW COMPARISON:  10/18/2019 FINDINGS: Endotracheal tube has tip 7.9 cm above the carina. Right-sided PICC line has tip over the SVC. Lungs are adequately inflated with mild opacification of the right base likely atelectasis and small effusion. Left lung is clear. Mild stable cardiomegaly. Remainder of the exam is unchanged. IMPRESSION: Mild left base opacification likely atelectasis with small amount of pleural fluid. Mild stable cardiomegaly. Tubes and lines as described. Electronically Signed   By: Marin Olp M.D.   On: 12/04/2019 17:00    Procedures Procedures (including critical care time) CRITICAL CARE Performed by: Quintella Reichert   Total critical care time: 35 minutes  Critical care time was exclusive of separately billable procedures and treating other patients.  Critical care was necessary to treat or prevent imminent or life-threatening deterioration.  Critical care was time spent personally by me on the following activities: development  of treatment plan with patient and/or surrogate as well as nursing, discussions with consultants, evaluation of patient's response to treatment, examination of patient, obtaining history from patient or surrogate, ordering and performing treatments and interventions, ordering and review of laboratory studies, ordering and review of radiographic studies, pulse oximetry and re-evaluation of patient's condition.  Medications Ordered in ED Medications  insulin aspart (novoLOG) injection 0-20 Units (has no administration in time range)  pantoprazole (PROTONIX) injection 40 mg (has no administration in time range)  chlorhexidine gluconate (MEDLINE KIT) (PERIDEX) 0.12 % solution 15 mL (15 mLs Mouth Rinse Not Given 12/04/19 2313)  MEDLINE mouth rinse (has no administration in time range)  fentaNYL (SUBLIMAZE) injection 50-200 mcg (has no administration in time range)  midazolam (VERSED) injection 2 mg (has no administration in time range)  heparin ADULT infusion 100 units/mL (25000 units/294m sodium chloride 0.45%) (has no administration in time range)  0.9 %  sodium chloride infusion ( Intravenous New Bag/Given 12/04/19 2257)  miconazole nitrate (MICATIN) topical powder ( Topical Not Given 12/04/19 2313)  piperacillin-tazobactam (ZOSYN) IVPB 3.375 g (has no administration in time range)  alum & mag hydroxide-simeth (MAALOX/MYLANTA) 200-200-20 MG/5ML suspension 30 mL (30 mLs Oral Given 12/04/19 1749)    And  lidocaine (XYLOCAINE) 2 % viscous mouth solution 15 mL (15 mLs Oral Given 12/04/19 1749)  HYDROcodone-acetaminophen (NORCO/VICODIN) 5-325 MG per tablet 1 tablet (1 tablet Oral Given 12/04/19 1821)  iohexol (OMNIPAQUE) 350 MG/ML injection 80 mL (80 mLs Intravenous Contrast Given 12/04/19 2101)  piperacillin-tazobactam (ZOSYN) IVPB 3.375 g (0 g Intravenous Stopped 12/04/19 2327)    ED Course  I have reviewed the triage vital signs and the nursing notes.  Pertinent labs & imaging results that were  available during my care of the patient were reviewed by me and considered in my medical decision making (see chart for details).    MDM Rules/Calculators/A&P                     patient with history of ventilator dependence for chronic respiratory failure here for evaluation of chest pain that began today. She is non-toxic appearing on evaluation with no respiratory distress. She does have a history of prior  DVT two years ago but her anticoagulation was discontinued due to massive G.I. bleed. CTA obtained today demonstrate multiple sub segmental pulmonary embolism's, may be contributing to her chest pain. No evidence of cor pulmonale. Will start Lovenox and she will need careful monitoring for her recurrent pulmonary embolism's. Critical care consulted for admission.  Final Clinical Impression(s) / ED Diagnoses Final diagnoses:  Chronic respiratory failure with hypoxia and hypercapnia ([place])  Multiple subsegmental pulmonary emboli without acute cor pulmonale [facility])    Rx / DC Orders ED Discharge Orders    None       Quintella Reichert, MD 12/05/19 0121

## 2019-12-04 NOTE — H&P (Signed)
NAME:  Kim Jenkins, MRN:  [id number], DOB:  [birth year], LOS: 0 ADMISSION DATE:  12/04/2019, CONSULTATION DATE:  12/04/2019 REFERRING MD:  Dr. Ralene Bathe, ER, CHIEF COMPLAINT:  Chest pain  Brief History   54 yo female from [facility] with chest pain and dyspnea.  Found to have PE and aspiration pneumonia.  Hx of OSA/OHS with chronic trach/vent.  Pt not able to provide hx.  Past Medical History  OSA, OHS, Panniculitis, Nephrolithiasis, HTN, DM, Depression, Bipolar, Chronic pain, Duodenal ulcer 2019  Significant Hospital Events   3/18 transfer from [facility]  Consults:    Procedures:  Trach >>  Significant Diagnostic Tests:  CT angio chest 3/18 >> b/l subsegmental nonocclusive PE, atherosclerosis, small Rt effusion, bibasilar consolidation, debris in airways, mild centrilobular emphysema, old Rt 7th and 8th rib fx's  Micro Data:  SARS CoV2 PCR 3/18 >> negative Influenza PCR 3/18 >> negative Sputum 3/18 >>   Antimicrobials:  Zosyn 3/18 >>   Interim history/subjective:    Objective   Blood pressure 105/67, pulse 71, temperature 98.4 F (36.9 C), resp. rate 14, height 5\' 6"  (1.676 m), weight (!) 166 kg, SpO2 98 %.    Vent Mode: PRVC FiO2 (%):  [40 %-55 %] 40 % Set Rate:  [12 bmp] 12 bmp Vt Set:  [500 mL] 500 mL PEEP:  [5 cmH20] 5 cmH20 Plateau Pressure:  [18 cmH20] 18 cmH20  No intake or output data in the 24 hours ending 12/04/19 2225 Filed Weights   12/04/19 2117  Weight: (!) 166 kg    Examination:  General - somnolent, wakes up briefly with stimulation Eyes - pupils reactive ENT - trach site clean Cardiac - regular rate/rhythm, no murmur Chest - decreased breath sounds, faint crackles at bases Abdomen - obese, soft, distant bowel sounds Extremities - 1+ edema Skin - erythema under skin folds around pannus Neuro - follows simple commands   Resolved Hospital Problem list     Assessment & Plan:   Acute pulmonary embolism. - heparin gtt per pharmacy - no indication  for thrombolytic therapy  - f/u Echo, doppler legs  Aspiration pneumonitis. - start zosyn - check sputum culture  Acute on chronic hypoxic respiratory failure with tracheostomy status. - full vent support - f/u CXR  Acute metabolic encephalopathy 2nd to hypoxia, pneumonia. Hx of depression, chronic pain, bipolar. - RASS goal 0 to -1 - hold abilify, valium, depakote, norco, trazodone, effexor for now  DM type II poorly controlled with hyperglycemia. - SSI  Hx of HTN. - hold outpt lopressor, torsemide for now  Anemia of chronic disease. - f/u CBC - transfuse for Hb < 7 or significant bleeding  Best practice:  Diet: NPO DVT prophylaxis: Heparin gtt GI prophylaxis: Protonix Mobility: Bed rest Code Status: full code Disposition: ICU  Labs   CBC: Recent Labs  Lab 12/04/19 1633  WBC 7.1  HGB 10.3*  HCT 34.8*  MCV 91.3  PLT [id number]    Basic Metabolic Panel: Recent Labs  Lab 12/04/19 1633  NA 141  K 4.4  CL 89*  CO2 44*  GLUCOSE 152*  BUN 17  CREATININE 0.61  CALCIUM 9.5   GFR: Estimated Creatinine Clearance: 127.9 mL/min (by C-G formula based on SCr of 0.61 mg/dL). Recent Labs  Lab 12/04/19 1633  WBC 7.1    Liver Function Tests: Recent Labs  Lab 12/04/19 1729  AST 19  ALT 13  ALKPHOS 48  BILITOT 0.4  PROT 7.6  ALBUMIN 2.9*   No results  for input(s): LIPASE, AMYLASE in the last 168 hours. No results for input(s): AMMONIA in the last 168 hours.  ABG    Component Value Date/Time   PHART 7.345 (L) 05/05/2018 1839   PCO2ART 65.4 (HH) 05/05/2018 1839   PO2ART 91.0 05/05/2018 1839   HCO3 48.3 (H) 10/18/2019 2249   TCO2 50 (H) 10/18/2019 2249   O2SAT 98.0 10/18/2019 2249     Coagulation Profile: No results for input(s): INR, PROTIME in the last 168 hours.  Cardiac Enzymes: No results for input(s): CKTOTAL, CKMB, CKMBINDEX, TROPONINI in the last 168 hours.  HbA1C: No results found for: HGBA1C  CBG: No results for input(s): GLUCAP in the  last 168 hours.  Review of Systems:   Unable to obtain.  Past Medical History  She,  has a past medical history of Bipolar 1 disorder ([place]), Chronic pain, Depression, Diabetes mellitus ([place]), Hypertension, Kidney stones, Morbid obesity ([place]), Obesity hypoventilation syndrome ([place]), Obstructive sleep apnea, and Panniculitis.   Surgical History    Past Surgical History:  Procedure Laterality Date  . ESOPHAGOGASTRODUODENOSCOPY N/A 05/06/2018   Procedure: ESOPHAGOGASTRODUODENOSCOPY (EGD);  Surgeon: Irving Copas., MD;  Location: [place];  Service: Gastroenterology;  Laterality: N/A;  . RIGHT OOPHORECTOMY    . TRACHEOSTOMY    . VENTRAL HERNIA REPAIR       Social History   reports that she has never smoked. She has never used smokeless tobacco. She reports that she does not drink alcohol or use drugs.   Family History   Her family history is not on file.   Allergies Allergies  Allergen Reactions  . Meperidine And Related Shortness Of Breath  . Cefazolin Other (See Comments)    Per MAR     Home Medications  Prior to Admission medications   Medication Sig Start Date End Date Taking? Authorizing Provider  ARIPiprazole (ABILIFY) 5 MG tablet Take 1 tablet (5 mg total) by mouth daily. 09/13/18   Ward, Ozella Almond, PA-C  chlorhexidine (HIBICLENS) 4 % external liquid Apply 1 application topically daily. For bath    [provider]  Cholecalciferol 1000 units tablet Take 1,000 Units by mouth daily.    [provider]  diazepam (VALIUM) 5 MG tablet Take 5 mg by mouth as needed for anxiety or muscle spasms.    [provider]  divalproex (DEPAKOTE ER) 500 MG 24 hr tablet Take 2 tablets (1,000 mg total) by mouth every 12 (twelve) hours. 09/13/18   Ward, Ozella Almond, PA-C  ergocalciferol (VITAMIN D2) 50000 units capsule Take 50,000 Units by mouth once a week.     [provider]  Exenatide ER (BYDUREON) 2 MG PEN Inject 2 mg into the skin  every Thursday.    [provider]  famotidine (PEPCID) 20 MG tablet Take 20 mg by mouth at bedtime.    [provider]  fluticasone (FLONASE) 50 MCG/ACT nasal spray Place 2 sprays into both nostrils daily.    [provider]  HYDROcodone-acetaminophen (NORCO/VICODIN) 5-325 MG tablet Take 1 tablet by mouth every 6 (six) hours as needed for moderate pain. 05/12/19   Carmin Muskrat, MD  insulin glargine (LANTUS) 100 UNIT/ML injection Inject 10-40 Units into the skin See admin instructions. Inject 10 units in the am and inject 40 at bedtime.    [provider]  levofloxacin (LEVAQUIN) 750 MG tablet Take 1 tablet (750 mg total) by mouth daily. 10/18/19   Noemi Chapel, MD  loratadine (CLARITIN) 10 MG tablet Take 10 mg  by mouth daily.     [provider]  metoprolol tartrate (LOPRESSOR) 50 MG tablet Take 50 mg by mouth 2 (two) times daily.    [provider]  Multiple Vitamin (MULTIVITAMIN WITH MINERALS) TABS tablet Take 1 tablet by mouth daily.    [provider]  olopatadine (PATANOL) 0.1 % ophthalmic solution Place 1 drop into both eyes 2 (two) times daily.    [provider]  pantoprazole (PROTONIX) 40 MG tablet Take 40 mg by mouth daily.     [provider]  torsemide (DEMADEX) 20 MG tablet Take 20 mg by mouth daily.    [provider]  traZODone (DESYREL) 150 MG tablet Take 1 tablet (150 mg total) by mouth at bedtime. 09/13/18   Ward, Ozella Almond, PA-C  venlafaxine XR (EFFEXOR-XR) 75 MG 24 hr capsule Take 225 mg by mouth daily with breakfast.    [provider]     Critical care time: 37 minutes     Chesley Mires, MD [place] 12/04/2019, 10:38 PM

## 2019-12-04 NOTE — ED Triage Notes (Signed)
[facility] patient arrives BIB [facility], complaints of chest pain for seven hours, pressure in her left chest. Trach patient.  Ordinarily only sleeps on a vent, but today has been on vent due to a chest X-ray taken at [facility] shows infiltrate on the right side. Pt endorses more shortness of breath than usual. 93% on 45% on the vent.

## 2019-12-04 NOTE — Procedures (Signed)
ABG drawn with results higher then reportable range.  MD aware, no re draw at this time.

## 2019-12-04 NOTE — Progress Notes (Signed)
Pharmacy Antibiotic Note  Kim Jenkins is a 54 y.o. female admitted on 12/04/2019 with pneumonia.  Pharmacy has been consulted for zosyn dosing. Pt is afebrile and WBC is WNL. Scr is also WNL.   Plan: Zosyn 3.375gm IV Q8H (4 hr inf) F/u renal fxn, C&S, clinical status   Height: 5\' 6"  (167.6 cm) Weight: (!) 366 lb (166 kg)(last documented weight) IBW/kg (Calculated) : 59.3  Temp (24hrs), Avg:98.4 F (36.9 C), Min:98.4 F (36.9 C), Max:98.4 F (36.9 C)  Recent Labs  Lab 12/04/19 1633  WBC 7.1  CREATININE 0.61    Estimated Creatinine Clearance: 127.9 mL/min (by C-G formula based on SCr of 0.61 mg/dL).    Allergies  Allergen Reactions  . Meperidine And Related Shortness Of Breath  . Cefazolin Other (See Comments)    Per MAR    Antimicrobials this admission: Zosyn 3/18>>  Dose adjustments this admission: N/A  Microbiology results: Pending  Thank you for allowing pharmacy to be a part of this patient's care.  Ellamay Fors, Rande Lawman 12/04/2019 10:49 PM

## 2019-12-04 NOTE — ED Triage Notes (Signed)
Pt states pain radiates to L arm, 324 asprin and 1 nitro given by EMS with no releif.

## 2019-12-04 NOTE — Progress Notes (Signed)
RT note: RT received [facility] patient from EMS with a NRB over trach. Patient oriented to time and place. Vocalizes with PMV in place with a # 6 Portex trach in place.RT placed patient on trach collar and titrated FIO2 for sats 92-97 %. MD and bedside RN aware.

## 2019-12-05 ENCOUNTER — Inpatient Hospital Stay (HOSPITAL_COMMUNITY): Payer: Medicare Other

## 2019-12-05 DIAGNOSIS — J9612 Chronic respiratory failure with hypercapnia: Secondary | ICD-10-CM

## 2019-12-05 DIAGNOSIS — J9611 Chronic respiratory failure with hypoxia: Secondary | ICD-10-CM

## 2019-12-05 DIAGNOSIS — R609 Edema, unspecified: Secondary | ICD-10-CM

## 2019-12-05 DIAGNOSIS — R0602 Shortness of breath: Secondary | ICD-10-CM

## 2019-12-05 DIAGNOSIS — R079 Chest pain, unspecified: Secondary | ICD-10-CM

## 2019-12-05 LAB — CBC
HCT: 32.6 % — ABNORMAL LOW (ref 36.0–46.0)
Hemoglobin: 9.6 g/dL — ABNORMAL LOW (ref 12.0–15.0)
MCH: 26.8 pg (ref 26.0–34.0)
MCHC: 29.4 g/dL — ABNORMAL LOW (ref 30.0–36.0)
MCV: 91.1 fL (ref 80.0–100.0)
Platelets: 148 10*3/uL — ABNORMAL LOW (ref 150–400)
RBC: 3.58 MIL/uL — ABNORMAL LOW (ref 3.87–5.11)
RDW: 14.3 % (ref 11.5–15.5)
WBC: 7.6 10*3/uL (ref 4.0–10.5)
nRBC: 0 % (ref 0.0–0.2)

## 2019-12-05 LAB — ECHOCARDIOGRAM COMPLETE
Height: 66 in
Weight: 5856 [oz_av]

## 2019-12-05 LAB — MRSA PCR SCREENING: MRSA by PCR: NEGATIVE

## 2019-12-05 LAB — GLUCOSE, CAPILLARY
Glucose-Capillary: 105 mg/dL — ABNORMAL HIGH (ref 70–99)
Glucose-Capillary: 100 mg/dL — ABNORMAL HIGH (ref 70–99)
Glucose-Capillary: 115 mg/dL — ABNORMAL HIGH (ref 70–99)
Glucose-Capillary: 104 mg/dL — ABNORMAL HIGH (ref 70–99)
Glucose-Capillary: 98 mg/dL (ref 70–99)

## 2019-12-05 LAB — COMPREHENSIVE METABOLIC PANEL
ALT: 13 U/L (ref 0–44)
AST: 14 U/L — ABNORMAL LOW (ref 15–41)
Albumin: 2.6 g/dL — ABNORMAL LOW (ref 3.5–5.0)
Alkaline Phosphatase: 47 U/L (ref 38–126)
Anion gap: 11 (ref 5–15)
BUN: 16 mg/dL (ref 6–20)
CO2: 43 mmol/L — ABNORMAL HIGH (ref 22–32)
Calcium: 9.3 mg/dL (ref 8.9–10.3)
Chloride: 87 mmol/L — ABNORMAL LOW (ref 98–111)
Creatinine, Ser: 0.62 mg/dL (ref 0.44–1.00)
GFR calc Af Amer: >60 mL/min (ref >60)
GFR calc non Af Amer: >60 mL/min (ref >60)
Glucose, Bld: 104 mg/dL — ABNORMAL HIGH (ref 70–99)
Potassium: 3.6 mmol/L (ref 3.5–5.1)
Sodium: 141 mmol/L (ref 135–145)
Total Bilirubin: 0.6 mg/dL (ref 0.3–1.2)
Total Protein: 7 g/dL (ref 6.5–8.1)

## 2019-12-05 LAB — HEPARIN LEVEL (UNFRACTIONATED): Heparin Unfractionated: 0.98 IU/mL — ABNORMAL HIGH (ref 0.30–0.70)

## 2019-12-05 LAB — HEMOGLOBIN A1C
Hgb A1c MFr Bld: 5.2 % (ref 4.8–5.6)
Mean Plasma Glucose: 102.54 mg/dL

## 2019-12-05 LAB — MAGNESIUM: Magnesium: 1.6 mg/dL — ABNORMAL LOW (ref 1.7–2.4)

## 2019-12-05 MED ORDER — SODIUM CHLORIDE 0.9% FLUSH
10.0000 mL | Freq: Two times a day (BID) | INTRAVENOUS | Status: DC
  Administered 2019-12-05 – 2019-12-06 (×3): 10 mL
  Administered 2019-12-06: 09:00:00 20 mL
  Administered 2019-12-07 – 2019-12-09 (×5): 10 mL

## 2019-12-05 MED ORDER — SODIUM CHLORIDE 0.9% FLUSH: 10.0000 mL | INTRAVENOUS | Status: DC | PRN

## 2019-12-05 MED ORDER — MAGNESIUM SULFATE 2 GM/50ML IV SOLN
2.0000 g | Freq: Once | INTRAVENOUS | Status: AC
  Administered 2019-12-05: 15:00:00 2 g via INTRAVENOUS
  Filled 2019-12-05: qty 50

## 2019-12-05 MED ORDER — ONDANSETRON HCL 4 MG/2ML IJ SOLN
4.0000 mg | Freq: Four times a day (QID) | INTRAMUSCULAR | Status: DC | PRN
  Administered 2019-12-05: 4 mg via INTRAVENOUS
  Filled 2019-12-05: qty 2

## 2019-12-05 MED ORDER — CHLORHEXIDINE GLUCONATE CLOTH 2 % EX PADS
6.0000 | MEDICATED_PAD | Freq: Every day | CUTANEOUS | Status: DC
  Administered 2019-12-05 – 2019-12-09 (×5): 6 via TOPICAL

## 2019-12-05 MED ORDER — PERFLUTREN LIPID MICROSPHERE
1.0000 mL | INTRAVENOUS | Status: AC | PRN
  Administered 2019-12-05: 2 mL via INTRAVENOUS
  Filled 2019-12-05: qty 10

## 2019-12-05 NOTE — Plan of Care (Signed)

## 2019-12-05 NOTE — Social Work (Signed)
Patient is from [place]. CSW spoke with [facility] liaison Prem Ashby Dawes [phone]. CSW informed liaison that patient will not be discharging today. TOC team will continue to follow and assist with discharge planning needs.  Criss Alvine, CSW

## 2019-12-05 NOTE — Progress Notes (Signed)
PULMONARY / CRITICAL CARE MEDICINE   NAME:  Kim Jenkins, MRN:  [id number], DOB:  April 06, 1966, LOS: 1 ADMISSION DATE:  12/04/2019, CONSULTATION DATE:  12/04/2019 REFERRING MD:  ED Provider, CHIEF COMPLAINT:  Chest pain, PE  BRIEF HISTORY:    54 year old female with history of OSA/OHS with chronic trach/vent from [facility] with chest pain and dyspnea found to have PE and aspiration pneumonia.   SIGNIFICANT PAST MEDICAL HISTORY    Past Medical History:  Diagnosis Date  . Bipolar 1 disorder ([place])   . Chronic pain   . Depression   . Diabetes mellitus ([place])   . Hypertension   . Kidney stones   . Morbid obesity ([place])   . Obesity hypoventilation syndrome ([place])   . Obstructive sleep apnea   . Panniculitis    SIGNIFICANT EVENTS:  3/18> admitted from [facility] [place], found to have bilateral subsegmental nonocclusive PE, started on heparin gtt  STUDIES:   CT angio chest 3/18 >> b/l subsegmental nonocclusive PE, atherosclerosis, small Rt effusion, bibasilar consolidation, debris in airways, mild centrilobular emphysema, old Rt 7th and 8th rib fx's  CULTURES:  SARS CoV2 PCR 3/18 >> negative Influenza PCR 3/18 >> negative Sputum 3/18 >>   ANTIBIOTICS:  Zosyn 3/18>>   LINES/TUBES:  Trach (pre-existing) >>  CONSULTANTS:  None  SUBJECTIVE:    CONSTITUTIONAL: BP 96/61   Pulse 80   Temp 98.2 F (36.8 C) (Oral)   Resp 16   Ht 5\' 6"  (1.676 m)   Wt (!) 166 kg Comment: last documented weight  SpO2 94%   BMI 59.07 kg/m   I/O last 3 completed shifts: In: 400.1 [I.V.:400.1] Out: -      Vent Mode: PRVC FiO2 (%):  [40 %-55 %] 40 % Set Rate:  [12 bmp] 12 bmp Vt Set:  [500 mL] 500 mL PEEP:  [5 cmH20] 5 cmH20 Plateau Pressure:  [16 cmH20-18 cmH20] 16 cmH20  PHYSICAL EXAM: General:  Morbidly obese female with tracheostomy collar in place; awake and alert, no acute distress Neuro:  Alert and oriented, no focal neurologic deficits noted HEENT:  [place]/AT, tracheostomy collar in place; PERRL  EOMI Cardiovascular:  RRR, S1 and S2 present, unable to appreciate murmurs, rubs or gallops Lungs:  Decreased breath sounds diffusely Abdomen:  Obese, soft, tenderness to palpation of left upper and lower quadrants Musculoskeletal: 1+ edema in bilateral lower extremities Skin:  Erythema under pannus skin folds  RESOLVED PROBLEM LIST   ASSESSMENT AND PLAN   Acute on chronic hypoxic respiratory failure with tracheostomy 2/2 acute PE:  - Continue full ventilator support - Goal SpO2 >88% - SLP eval   Acute pulmonary embolism:  - Continue heparin gtt - F/u Echo - Continue cardiac monitoring   Aspiration pneumonitis:  Currently afebrile and without leukocytosis - Continue zosyn day 2 - Sputum culture pending   Acute metabolic encephalopathy 2/2 hypoxia and pneumonia Hx of depression, chronic pain, bipolar disorder - RASS goal 0 to -1 - Fentanyl and versed prn  - Holding home abilify, valium, depakote, norco, trazodone and effexor   DM II (poorly controlled) - CBG monitoring q4h - SSI, resistant  Anemia of chronic disease: Appears to be stable, at baseline  - transfuse for Hb <7 or significant bleeding    Best Practice / Goals of Care / Disposition.   Diet: tube feeds per dietician  Pain/Agitation/Delirium protocol (if indicated):  Fentanyl and versed prn  VAP Prevention protocol (if indicated): per protocol  DVT PROPHYLAXIS: Heparin gtt GI Prophylaxis:  PPI Mobility: Bed rest  Code status:FULL Family communication: attempted contact w/Jodie Wynetta Emery - unable to reach Disposition: ICU  LABS  Glucose Recent Labs  Lab 12/05/19 0325 12/05/19 0806  GLUCAP 105* 100*    BMET Recent Labs  Lab 12/04/19 1633 12/05/19 0615  NA 141 141  K 4.4 3.6  CL 89* 87*  CO2 44* 43*  BUN 17 16  CREATININE 0.61 0.62  GLUCOSE 152* 104*    Liver Enzymes Recent Labs  Lab 12/04/19 1729 12/05/19 0615  AST 19 14*  ALT 13 13  ALKPHOS 48 47  BILITOT 0.4 0.6  ALBUMIN 2.9*  2.6*    Electrolytes Recent Labs  Lab 12/04/19 1633 12/05/19 0615  CALCIUM 9.5 9.3  MG  --  1.6*    CBC Recent Labs  Lab 12/04/19 1633 12/05/19 0615  WBC 7.1 7.6  HGB 10.3* 9.6*  HCT 34.8* 32.6*  PLT 167 148*    ABG No results for input(s): PHART, PCO2ART, PO2ART in the last 168 hours.  Coag's No results for input(s): APTT, INR in the last 168 hours.  Sepsis Markers No results for input(s): LATICACIDVEN, PROCALCITON, O2SATVEN in the last 168 hours.  Cardiac Enzymes No results for input(s): TROPONINI, PROBNP in the last 168 hours.    Critical care time: 40 minutes  Harvie Heck, MD Internal Medicine, PGY-1 12/05/19 10:04 AM

## 2019-12-05 NOTE — Progress Notes (Signed)
[place] for heparin  Indication: pulmonary embolus  Allergies  Allergen Reactions  . Meperidine And Related Shortness Of Breath  . Cefazolin Other (See Comments)    Per [facility]    Patient Measurements: Height: 5\' 6"  (167.6 cm) Weight: (!) 366 lb (166 kg)(last documented weight) IBW/kg (Calculated) : 59.3  Dosing weight= 102kg  Vital Signs: Temp: 98.8 F (37.1 C) (03/19 1500) Temp Source: Oral (03/19 1500) BP: 87/69 (03/19 1800) Pulse Rate: 73 (03/19 1800)  Labs: Recent Labs    12/04/19 1633 12/04/19 1950 12/05/19 0615 12/05/19 1647  HGB 10.3*  --  9.6*  --   HCT 34.8*  --  32.6*  --   PLT 167  --  148*  --   HEPARINUNFRC  --   --   --  0.98*  CREATININE 0.61  --  0.62  --   TROPONINIHS 4 4  --   --     Estimated Creatinine Clearance: 127.9 mL/min (by C-G formula based on SCr of 0.62 mg/dL).   Medical History: Past Medical History:  Diagnosis Date  . Bipolar 1 disorder ([place])   . Chronic pain   . Depression   . Diabetes mellitus ([place])   . Hypertension   . Kidney stones   . Morbid obesity ([place])   . Obesity hypoventilation syndrome ([place])   . Obstructive sleep apnea   . Panniculitis     Assessment: 15 yof presented to the ED with CP from [facility]. Found to have a PE and is on heparin. She was on lovenox and got a dose on 3/18 at 10:30pm. IV heparin was started at 1:30 per the [facility] -hg= 9.7 -heparin level= 0.98  Goal of Therapy:  Heparin level= 0.3-0.7 Monitor platelets by anticoagulation protocol: Yes   Plan:  -Decrease heparin to 1450 units/hr -Heparin level in 6 hours and daily wth CBC daily   Hildred Laser, PharmD Clinical Pharmacist **Pharmacist phone directory can now be found on [email or web address] ([email or web address]).  Listed under [place].  12/05/2019 7:00 PM

## 2019-12-05 NOTE — Progress Notes (Signed)
  Echocardiogram 2D Echocardiogram has been performed.  Darlina Sicilian M 12/05/2019, 9:02 AM

## 2019-12-05 NOTE — Progress Notes (Signed)
[facility] Progress Note Patient Name: Shakari Marse DOB: 1966-07-31 MRN: [id number]   Date of Service  12/05/2019  HPI/Events of Note  New patient eval  45F from [facility] who is s/p trach and chronically on mechanical ventilation at night. They presented with chest pain and dyspnea, subsequently found to have PE and aspiration pneumonia. Admitted to ICU because they are ventilated in setting of the above issues.   eICU Interventions  Plan: - Heparin drip for PE. F/u bilateral LE duplex U/S. - Zosyn for aspiration pneumonia. F/u respiratory culture. - Continue MV at night. May be able to return to trach collar during daytime tomorrow. Reassess in AM. - mIVF at 50cc/hr.  DVT PPX: Therapeutic A/C as above GI PPX: Protonix Code Status: Full Code     Intervention Category Evaluation Type: New Patient Evaluation  Charlott Rakes 12/05/2019, 4:16 AM

## 2019-12-05 NOTE — Progress Notes (Signed)
[facility] Progress Note Patient Name: Kim Jenkins DOB: November 10, 1965 MRN: [id number]   Date of Service  12/05/2019  HPI/Events of Note  Nausea  eICU Interventions  PRN Zofran ordered        Frederik Pear 12/05/2019, 9:12 PM

## 2019-12-06 ENCOUNTER — Other Ambulatory Visit: Payer: Self-pay

## 2019-12-06 LAB — CBC
HCT: 32.4 % — ABNORMAL LOW (ref 36.0–46.0)
Hemoglobin: 9.7 g/dL — ABNORMAL LOW (ref 12.0–15.0)
MCH: 27.1 pg (ref 26.0–34.0)
MCHC: 29.9 g/dL — ABNORMAL LOW (ref 30.0–36.0)
MCV: 90.5 fL (ref 80.0–100.0)
Platelets: 152 10*3/uL (ref 150–400)
RBC: 3.58 MIL/uL — ABNORMAL LOW (ref 3.87–5.11)
RDW: 14.4 % (ref 11.5–15.5)
WBC: 5.5 10*3/uL (ref 4.0–10.5)
nRBC: 0 % (ref 0.0–0.2)

## 2019-12-06 LAB — GLUCOSE, CAPILLARY
Glucose-Capillary: 104 mg/dL — ABNORMAL HIGH (ref 70–99)
Glucose-Capillary: 111 mg/dL — ABNORMAL HIGH (ref 70–99)
Glucose-Capillary: 118 mg/dL — ABNORMAL HIGH (ref 70–99)
Glucose-Capillary: 134 mg/dL — ABNORMAL HIGH (ref 70–99)
Glucose-Capillary: 141 mg/dL — ABNORMAL HIGH (ref 70–99)
Glucose-Capillary: 149 mg/dL — ABNORMAL HIGH (ref 70–99)
Glucose-Capillary: 207 mg/dL — ABNORMAL HIGH (ref 70–99)
Glucose-Capillary: 89 mg/dL (ref 70–99)

## 2019-12-06 LAB — HEPARIN LEVEL (UNFRACTIONATED)
Heparin Unfractionated: 0.62 IU/mL (ref 0.30–0.70)
Heparin Unfractionated: 0.54 IU/mL (ref 0.30–0.70)

## 2019-12-06 LAB — MAGNESIUM: Magnesium: 2.2 mg/dL (ref 1.7–2.4)

## 2019-12-06 MED ORDER — VENLAFAXINE HCL ER 75 MG PO CP24
225.0000 mg | ORAL_CAPSULE | Freq: Every day | ORAL | Status: DC
  Administered 2019-12-07 – 2019-12-09 (×3): 225 mg via ORAL
  Filled 2019-12-06 (×3): qty 1

## 2019-12-06 MED ORDER — FENTANYL CITRATE (PF) 100 MCG/2ML IJ SOLN: 12.5000 ug | INTRAMUSCULAR | Status: DC | PRN

## 2019-12-06 MED ORDER — LORATADINE 10 MG PO TABS
10.0000 mg | ORAL_TABLET | Freq: Every day | ORAL | Status: DC
  Administered 2019-12-06 – 2019-12-09 (×4): 10 mg via ORAL
  Filled 2019-12-06 (×4): qty 1

## 2019-12-06 MED ORDER — DIVALPROEX SODIUM ER 500 MG PO TB24
1000.0000 mg | ORAL_TABLET | Freq: Two times a day (BID) | ORAL | Status: DC
  Administered 2019-12-06 – 2019-12-09 (×7): 1000 mg via ORAL
  Filled 2019-12-06 (×8): qty 2

## 2019-12-06 MED ORDER — RIVAROXABAN 15 MG PO TABS
15.0000 mg | ORAL_TABLET | Freq: Every day | ORAL | Status: DC
  Administered 2019-12-06 – 2019-12-07 (×2): 15 mg via ORAL
  Filled 2019-12-06 (×2): qty 1

## 2019-12-06 MED ORDER — RIVAROXABAN 20 MG PO TABS: 20.0000 mg | ORAL_TABLET | Freq: Every day | ORAL | Status: DC

## 2019-12-06 MED ORDER — TRAZODONE HCL 50 MG PO TABS
150.0000 mg | ORAL_TABLET | Freq: Every day | ORAL | Status: DC
  Administered 2019-12-06 – 2019-12-08 (×3): 150 mg via ORAL
  Filled 2019-12-06 (×3): qty 3

## 2019-12-06 MED ORDER — ARIPIPRAZOLE 5 MG PO TABS
5.0000 mg | ORAL_TABLET | Freq: Every day | ORAL | Status: DC
  Administered 2019-12-06 – 2019-12-09 (×4): 5 mg via ORAL
  Filled 2019-12-06 (×4): qty 1

## 2019-12-06 MED ORDER — OLOPATADINE HCL 0.1 % OP SOLN
1.0000 [drp] | Freq: Two times a day (BID) | OPHTHALMIC | Status: DC
  Administered 2019-12-06 – 2019-12-09 (×7): 1 [drp] via OPHTHALMIC
  Filled 2019-12-06: qty 5

## 2019-12-06 MED ORDER — HYDROCODONE-ACETAMINOPHEN 5-325 MG PO TABS
1.0000 | ORAL_TABLET | Freq: Four times a day (QID) | ORAL | Status: DC | PRN
  Administered 2019-12-06 – 2019-12-08 (×7): 1 via ORAL
  Filled 2019-12-06 (×7): qty 1

## 2019-12-06 MED ORDER — PANTOPRAZOLE SODIUM 40 MG PO TBEC
40.0000 mg | DELAYED_RELEASE_TABLET | Freq: Every day | ORAL | Status: DC
  Administered 2019-12-07 – 2019-12-09 (×3): 40 mg via ORAL
  Filled 2019-12-06 (×3): qty 1

## 2019-12-06 MED ORDER — ADULT MULTIVITAMIN W/MINERALS CH
1.0000 | ORAL_TABLET | Freq: Every day | ORAL | Status: DC
  Administered 2019-12-06 – 2019-12-09 (×4): 1 via ORAL
  Filled 2019-12-06 (×4): qty 1

## 2019-12-06 MED ORDER — LORAZEPAM 0.5 MG PO TABS
0.5000 mg | ORAL_TABLET | Freq: Four times a day (QID) | ORAL | Status: DC | PRN
  Administered 2019-12-06 – 2019-12-08 (×5): 0.5 mg via ORAL
  Filled 2019-12-06 (×5): qty 1

## 2019-12-06 NOTE — Progress Notes (Addendum)
PULMONARY / CRITICAL CARE MEDICINE   NAME:  Kim Jenkins, MRN:  [id number], DOB:  1966/02/09, LOS: 2 ADMISSION DATE:  12/04/2019, CONSULTATION DATE:  12/04/2019 REFERRING MD:  ED Provider, CHIEF COMPLAINT:  Chest pain, PE  BRIEF HISTORY:    54 y/o F with history of OSA/OHS with chronic trach/vent from [facility] with chest pain and dyspnea found to have PE and aspiration pneumonia.   SIGNIFICANT PAST MEDICAL HISTORY   Bipolar Disorder Chronic Pain  Depression  DM II  HTN  Kidney Stones Morbid Obesity  Obstructive Sleep Apnea / Obesity Hypoventilation Syndrome  Panniculitis   SIGNIFICANT EVENTS:  3/18 Admit from [facility], found to have bilateral subsegmental nonocclusive PE, started on heparin gtt  STUDIES:    CT angio chest 3/18 >> b/l subsegmental nonocclusive PE, atherosclerosis, small Rt effusion, bibasilar consolidation, debris in airways, mild centrilobular emphysema, old Rt 7th and 8th rib fx's  ECHO 3/19 >> LVEF 60-65%, normal LV function, grade I diastolic dysfunction, RVSP mildly reduced, RV mildly enlarged, wall thickness not well visualized, unable to assess PA pressures.  IVC dilated in size with less than 50% respiratory variability, RA pressure ~ 15 mmHg  CULTURES:  SARS CoV2 PCR 3/18 >> negative Influenza PCR 3/18 >> negative Sputum 3/18 >>   ANTIBIOTICS:  Zosyn 3/18 >>   LINES/TUBES:  Trach (pre-existing) >> RUE PICC (pre-existing) >>   CONSULTANTS:     SUBJECTIVE:  Tmax 98.4 / WBC 5.5  Glucose range 89-111 I/O 1L UOP, +82ml for 24h  Nausea overnight, treated with zofran.  Pt reports RUE soreness. Pre-existing PICC.    CONSTITUTIONAL: BP (!) 103/58   Pulse 66   Temp 98.4 F (36.9 C) (Oral)   Resp 17   Ht 5\' 6"  (1.676 m)   Wt (!) 166 kg Comment: last documented weight  SpO2 95%   BMI 59.07 kg/m   I/O last 3 completed shifts: In: 1547.1 [I.V.:1342.4; IV Piggyback:204.7] Out: [id number] [Urine:1075]     Vent Mode: PRVC FiO2 (%):  [40 %] 40 % Set  Rate:  [12 bmp] 12 bmp Vt Set:  [500 mL] 500 mL PEEP:  [5 cmH20] 5 cmH20 Plateau Pressure:  [16 cmH20] 16 cmH20  PHYSICAL EXAM: General: morbidly obese female lying lying in bed in NAD  HEENT: MM pink/moist, trach midline c/d/i, tolerates PMV Neuro: AAOx4, speech clear, appropriate CV: s1s2 RRR, SR in 80's, no m/r/g PULM:  Non-labored on 40% ATC, lungs bilaterally distant / difficult to assess due to body habitus GI: soft, bsx4 active  Extremities: warm/dry, no edema, LE's with scarring   Skin: no rashes or lesions  RESOLVED PROBLEM LIST    ASSESSMENT AND PLAN    Acute on chronic hypoxic respiratory failure with tracheostomy 2/2 acute PE Bilateral non-occlusive segmental PE, no central PE. This is her second occurrence of embolism.  She had prior GIB on anticoagulation that required central line / multiple transfusion.  -PRVC as rest mode, QHS -wean O2 for sats >88% -ATC during day -SLP evaluation, will add meds crushed in applesauce  -can go back to [facility] once SLP evaluation and transitioned to [place]  Acute pulmonary embolism Hx Prior Embolism  -continue heparin gtt, transition to [place] per pharmacy once SLP completed  -will need life long anticoagulation given second occurrence of clotting -may need consideration for IVC given prior bleeding -tele monitoring  -ECHO with limited windows as above   Aspiration pneumonitis  Currently afebrile and without leukocytosis -zosyn, D3/7 -consider transition to oral agent once  cleared by SLP -await sputum culture  Acute metabolic encephalopathy 2/2 hypoxia and pneumonia Hx of depression, chronic pain, bipolar disorder -RASS goal 0 -stop IV fentanyl, versed -resume home abilify, depakote, norco, effexor, trazodone -hold home PRN valium, low dose ativan PRN if needed  Hypomagnesemia  -follow Mg+, replace as indicated   DM II (poorly controlled) -SSI, resistant scale  -follow CBG Q4  Anemia of chronic disease Appears to be  stable, at baseline  -follow CBC -transfuse for Hgb<7% or active bleeding    Best Practice / Goals of Care / Disposition.   Diet: NPO Pain/Agitation/Delirium protocol (if indicated): n/a VAP Prevention protocol (if indicated): per protocol  DVT PROPHYLAXIS: Heparin gtt > DOAC GI Prophylaxis: PPI Mobility: Bed rest  Code status: FULL Family communication: Patient updated on plan of care 3/20.  Disposition: ICU for vent needs only.  Can go back to [facility] once transitioned to [place].   LABS  Glucose Recent Labs  Lab 12/05/19 1156 12/05/19 1550 12/05/19 1939 12/05/19 2358 12/06/19 0336 12/06/19 0821  GLUCAP 115* 104* 98 89 104* 111*    BMET Recent Labs  Lab 12/04/19 1633 12/05/19 0615  NA 141 141  K 4.4 3.6  CL 89* 87*  CO2 44* 43*  BUN 17 16  CREATININE 0.61 0.62  GLUCOSE 152* 104*    Liver Enzymes Recent Labs  Lab 12/04/19 1729 12/05/19 0615  AST 19 14*  ALT 13 13  ALKPHOS 48 47  BILITOT 0.4 0.6  ALBUMIN 2.9* 2.6*    Electrolytes Recent Labs  Lab 12/04/19 1633 12/05/19 0615 12/06/19 0236  CALCIUM 9.5 9.3  --   MG  --  1.6* 2.2    CBC Recent Labs  Lab 12/04/19 1633 12/05/19 0615 12/06/19 0236  WBC 7.1 7.6 5.5  HGB 10.3* 9.6* 9.7*  HCT 34.8* 32.6* 32.4*  PLT 167 148* 152    ABG No results for input(s): PHART, PCO2ART, PO2ART in the last 168 hours.  Coag's No results for input(s): APTT, INR in the last 168 hours.  Sepsis Markers No results for input(s): LATICACIDVEN, PROCALCITON, O2SATVEN in the last 168 hours.  Cardiac Enzymes No results for input(s): TROPONINI, PROBNP in the last 168 hours.    Noe Gens, MSN, NP-C [facility] 12/06/2019, 10:27 AM   Please see [email or web address] for pager details.

## 2019-12-06 NOTE — Progress Notes (Signed)
[place] for heparin >> Xarelto Indication: pulmonary embolus  Allergies  Allergen Reactions  . Meperidine And Related Shortness Of Breath  . Cefazolin Other (See Comments)    Per [facility]    Patient Measurements: Height: 5\' 6"  (167.6 cm) Weight: (!) 366 lb (166 kg)(last documented weight) IBW/kg (Calculated) : 59.3  Dosing weight= 102kg  Vital Signs: Temp: 98.4 F (36.9 C) (03/20 0810) Temp Source: Oral (03/20 0810) BP: 134/103 (03/20 1000) Pulse Rate: 75 (03/20 1000)  Labs: Recent Labs    12/04/19 1633 12/04/19 1633 12/04/19 1950 12/05/19 0615 12/05/19 1647 12/06/19 0236 12/06/19 0957  HGB 10.3*   < >  --  9.6*  --  9.7*  --   HCT 34.8*  --   --  32.6*  --  32.4*  --   PLT 167  --   --  148*  --  152  --   HEPARINUNFRC  --   --   --   --  0.98* 0.62 0.54  CREATININE 0.61  --   --  0.62  --   --   --   TROPONINIHS 4  --  4  --   --   --   --    < > = values in this interval not displayed.    Estimated Creatinine Clearance: 127.9 mL/min (by C-G formula based on SCr of 0.62 mg/dL).   Medical History: Past Medical History:  Diagnosis Date  . Bipolar 1 disorder ([place])   . Chronic pain   . Depression   . Diabetes mellitus ([place])   . Hypertension   . Kidney stones   . Morbid obesity ([place])   . Obesity hypoventilation syndrome ([place])   . Obstructive sleep apnea   . Panniculitis     Assessment: 54 yof presented to the ED with CP from [facility]. Found to have a PE and is on heparin. CCM consulted pharmacy to transition heparin drip to Xarelto once cleared by SLP.  Heparin level 0.54 (therapeutic) on gtt at 1450 units/hr. No bleeding noted.  Goal of Therapy:  Heparin level 0.3-0.7 units/ml Monitor platelets by anticoagulation protocol: Yes   Plan:  -Stop heparin drip once cleared by SLP -Give the first dose of Xarelto when the heparin drip is stopped -Xarelto 15 mg BID x 21 days, then 20 mg daily  Vertis Kelch, PharmD,  [facility] PGY2 Cardiology Pharmacy Resident Phone [phone] 12/06/2019       10:42 AM  Please check [email or web address] for unit-specific pharmacist phone numbers

## 2019-12-06 NOTE — Evaluation (Signed)
Clinical/Bedside Swallow Evaluation Patient Details  Name: Kim Jenkins MRN: [id number] Date of Birth: July 06, 1966  Today's Date: 12/06/2019 Time: SLP Start Time (ACUTE ONLY): 36 SLP Stop Time (ACUTE ONLY): 1549 SLP Time Calculation (min) (ACUTE ONLY): 15 min  Past Medical History:  Past Medical History:  Diagnosis Date  . Bipolar 1 disorder ([place])   . Chronic pain   . Depression   . Diabetes mellitus ([place])   . Hypertension   . Kidney stones   . Morbid obesity ([place])   . Obesity hypoventilation syndrome ([place])   . Obstructive sleep apnea   . Panniculitis    Past Surgical History:  Past Surgical History:  Procedure Laterality Date  . ESOPHAGOGASTRODUODENOSCOPY N/A 05/06/2018   Procedure: ESOPHAGOGASTRODUODENOSCOPY (EGD);  Surgeon: Irving Copas., MD;  Location: [facility];  Service: Gastroenterology;  Laterality: N/A;  . RIGHT OOPHORECTOMY    . TRACHEOSTOMY    . VENTRAL HERNIA REPAIR     HPI:  54 yo female with chronic trach from [facility] with chest pain and dyspnea. Found to have PE and aspiration pneumonia.  Hx of OSA/OHS with chronic trach/vent. BSE 05/06/18 with one immediate cough, otherwise there were no overt s/s aspiration.    Assessment / Plan / Recommendation Clinical Impression  Pt is resident at [facility] with chronic trach (cuffed trach with noctural vent; PMV on during day per pt). She was wearing her personal Passy-Muir speaking valve from [facility] on arrival and appeared to be tolerating well. She had an occasional cough not overtly related to solids or liquids per subjective assessment. SHe denies coughing with po's. Respirations are normal and vitals stable. She can initiate regular texture, thin liquids, pills with water, remain sitting upright after meals (diet ordered). No further ST needed.     SLP Visit Diagnosis: Dysphagia, unspecified (R13.10)    Aspiration Risk  Mild aspiration risk    Diet Recommendation Regular;Thin liquid   Liquid Administration  via: Cup;Straw Medication Administration: Whole meds with liquid Supervision: Patient able to self feed Compensations: Slow rate;Small sips/bites Postural Changes: Seated upright at 90 degrees;Remain upright for at least 30 minutes after po intake    Other  Recommendations Oral Care Recommendations: Oral care BID   Follow up Recommendations None      Frequency and Duration            Prognosis        Swallow Study   General HPI: 54 yo female with chronic trach from [facility] with chest pain and dyspnea. Found to have PE and aspiration pneumonia.  Hx of OSA/OHS with chronic trach/vent. BSE 05/06/18 with one immediate cough, otherwise there were no overt s/s aspiration.  Type of Study: Bedside Swallow Evaluation Previous Swallow Assessment: see HPI Diet Prior to this Study: NPO Temperature Spikes Noted: No Respiratory Status: Trach;Trach Collar Trach Size and Type: Cuff;#6;Deflated;With PMSV in place History of Recent Intubation: No Behavior/Cognition: Alert;Cooperative;Pleasant mood Oral Cavity Assessment: Within Functional Limits Oral Care Completed by SLP: No Oral Cavity - Dentition: Dentures, bottom;Other (Comment)(no top plate) Vision: Functional for self-feeding Self-Feeding Abilities: Able to feed self Patient Positioning: Upright in bed Baseline Vocal Quality: Normal Volitional Cough: Strong Volitional Swallow: Able to elicit    Oral/Motor/Sensory Function Overall Oral Motor/Sensory Function: Within functional limits   Ice Chips Ice chips: Not tested   Thin Liquid Thin Liquid: Within functional limits Presentation: Straw;Cup    Nectar Thick Nectar Thick Liquid: Not tested   Honey Thick Honey Thick Liquid: Not tested   Puree  Puree: Not tested   Solid     Solid: Within functional limits      Houston Siren 12/06/2019,4:18 PM  [phone]

## 2019-12-06 NOTE — Progress Notes (Signed)
[place] for heparin  Indication: pulmonary embolus  Allergies  Allergen Reactions  . Meperidine And Related Shortness Of Breath  . Cefazolin Other (See Comments)    Per [facility]    Patient Measurements: Height: 5\' 6"  (167.6 cm) Weight: (!) 366 lb (166 kg)(last documented weight) IBW/kg (Calculated) : 59.3  Dosing weight= 102kg  Vital Signs: Temp: 97.6 F (36.4 C) (03/20 0338) Temp Source: Axillary (03/20 0338) BP: 102/72 (03/20 0300) Pulse Rate: 61 (03/20 0300)  Labs: Recent Labs    12/04/19 1633 12/04/19 1633 12/04/19 1950 12/05/19 0615 12/05/19 1647 12/06/19 0236  HGB 10.3*   < >  --  9.6*  --  9.7*  HCT 34.8*  --   --  32.6*  --  32.4*  PLT 167  --   --  148*  --  152  HEPARINUNFRC  --   --   --   --  0.98* 0.62  CREATININE 0.61  --   --  0.62  --   --   TROPONINIHS 4  --  4  --   --   --    < > = values in this interval not displayed.    Estimated Creatinine Clearance: 127.9 mL/min (by C-G formula based on SCr of 0.62 mg/dL).   Medical History: Past Medical History:  Diagnosis Date  . Bipolar 1 disorder ([place])   . Chronic pain   . Depression   . Diabetes mellitus ([place])   . Hypertension   . Kidney stones   . Morbid obesity ([place])   . Obesity hypoventilation syndrome ([place])   . Obstructive sleep apnea   . Panniculitis     Assessment: 34 yof presented to the ED with CP from [facility]. Found to have a PE and is on heparin. She was on lovenox and got a dose on 3/18 at 10:30pm. IV heparin was started at 1:30 per the [facility]  Heparin level 0.62 (therapeutic) on gtt at 1450 units/hr. No bleeding noted.  Goal of Therapy:  Heparin level 0.3-0.7 units/ml Monitor platelets by anticoagulation protocol: Yes   Plan:  -Continue heparin at 1450 units/hr -F/u 6 hr heparin level to confirm therapeutic  Sherlon Handing, PharmD, BCPS Please see [email or web address] for complete clinical pharmacist phone list 12/06/2019 4:02 AM

## 2019-12-07 LAB — CBC
HCT: 30.8 % — ABNORMAL LOW (ref 36.0–46.0)
Hemoglobin: 9.5 g/dL — ABNORMAL LOW (ref 12.0–15.0)
MCH: 27.4 pg (ref 26.0–34.0)
MCHC: 30.8 g/dL (ref 30.0–36.0)
MCV: 88.8 fL (ref 80.0–100.0)
Platelets: 166 10*3/uL (ref 150–400)
RBC: 3.47 MIL/uL — ABNORMAL LOW (ref 3.87–5.11)
RDW: 14.6 % (ref 11.5–15.5)
WBC: 4.8 10*3/uL (ref 4.0–10.5)
nRBC: 0.4 % — ABNORMAL HIGH (ref 0.0–0.2)

## 2019-12-07 LAB — RENAL FUNCTION PANEL
Albumin: 2.7 g/dL — ABNORMAL LOW (ref 3.5–5.0)
Anion gap: 10 (ref 5–15)
BUN: 13 mg/dL (ref 6–20)
CO2: 36 mmol/L — ABNORMAL HIGH (ref 22–32)
Calcium: 9 mg/dL (ref 8.9–10.3)
Chloride: 93 mmol/L — ABNORMAL LOW (ref 98–111)
Creatinine, Ser: 0.93 mg/dL (ref 0.44–1.00)
GFR calc Af Amer: >60 mL/min (ref >60)
GFR calc non Af Amer: >60 mL/min (ref >60)
Glucose, Bld: 130 mg/dL — ABNORMAL HIGH (ref 70–99)
Phosphorus: 4.2 mg/dL (ref 2.5–4.6)
Potassium: 4.6 mmol/L (ref 3.5–5.1)
Sodium: 139 mmol/L (ref 135–145)

## 2019-12-07 LAB — GLUCOSE, CAPILLARY
Glucose-Capillary: 111 mg/dL — ABNORMAL HIGH (ref 70–99)
Glucose-Capillary: 170 mg/dL — ABNORMAL HIGH (ref 70–99)
Glucose-Capillary: 171 mg/dL — ABNORMAL HIGH (ref 70–99)
Glucose-Capillary: 138 mg/dL — ABNORMAL HIGH (ref 70–99)
Glucose-Capillary: 133 mg/dL — ABNORMAL HIGH (ref 70–99)

## 2019-12-07 LAB — CULTURE, RESPIRATORY W GRAM STAIN

## 2019-12-07 LAB — MAGNESIUM: Magnesium: 2.2 mg/dL (ref 1.7–2.4)

## 2019-12-07 MED ORDER — RIVAROXABAN 15 MG PO TABS
15.0000 mg | ORAL_TABLET | Freq: Two times a day (BID) | ORAL | Status: DC
  Administered 2019-12-07 – 2019-12-09 (×4): 15 mg via ORAL
  Filled 2019-12-07 (×6): qty 1

## 2019-12-07 MED ORDER — INSULIN ASPART 100 UNIT/ML ~~LOC~~ SOLN: 0.0000 [IU] | Freq: Every day | SUBCUTANEOUS | Status: DC

## 2019-12-07 MED ORDER — INSULIN ASPART 100 UNIT/ML ~~LOC~~ SOLN
0.0000 [IU] | Freq: Three times a day (TID) | SUBCUTANEOUS | Status: DC
  Administered 2019-12-08 (×2): 3 [IU] via SUBCUTANEOUS
  Administered 2019-12-08: 4 [IU] via SUBCUTANEOUS
  Administered 2019-12-09 (×2): 3 [IU] via SUBCUTANEOUS

## 2019-12-07 MED ORDER — SODIUM CHLORIDE 0.9 % IV SOLN
2.0000 g | INTRAVENOUS | Status: DC
  Administered 2019-12-07 – 2019-12-08 (×2): 2 g via INTRAVENOUS
  Filled 2019-12-07: qty 2
  Filled 2019-12-07: qty 20
  Filled 2019-12-07: qty 2

## 2019-12-07 MED ORDER — RIVAROXABAN 20 MG PO TABS: 20.0000 mg | ORAL_TABLET | Freq: Every day | ORAL | Status: DC

## 2019-12-07 NOTE — Progress Notes (Signed)
PULMONARY / CRITICAL CARE MEDICINE   NAME:  Kim Jenkins, MRN:  [id number], DOB:  [birth year], LOS: 3 ADMISSION DATE:  12/04/2019, CONSULTATION DATE:  12/04/2019 REFERRING MD:  ED Provider, CHIEF COMPLAINT:  Chest pain, PE  BRIEF HISTORY:    54 y/o F with history of OSA/OHS with chronic trach/vent from [facility] with chest pain and dyspnea found to have PE and aspiration pneumonia.   SIGNIFICANT PAST MEDICAL HISTORY   Bipolar Disorder Chronic Pain  Depression  DM II  HTN  Kidney Stones Morbid Obesity  Obstructive Sleep Apnea / Obesity Hypoventilation Syndrome  Panniculitis   SIGNIFICANT EVENTS:  3/18 Admit from [place], found to have bilateral subsegmental nonocclusive PE, started on heparin gtt 3/20 Transitioned to DOAC  STUDIES:    CT angio chest 3/18 >> b/l subsegmental nonocclusive PE, atherosclerosis, small Rt effusion, bibasilar consolidation, debris in airways, mild centrilobular emphysema, old Rt 7th and 8th rib fx's  ECHO 3/19 >> LVEF 60-65%, normal LV function, grade I diastolic dysfunction, RVSP mildly reduced, RV mildly enlarged, wall thickness not well visualized, unable to assess PA pressures.  IVC dilated in size with less than 50% respiratory variability, RA pressure ~ 15 mmHg  CULTURES:  SARS CoV2 PCR 3/18 >> negative Influenza PCR 3/18 >> negative Sputum 3/18 >> proteus mirabilis >> S-ceftriaxone   ANTIBIOTICS:  Zosyn 3/18 >> 3/20 Ceftriaxone 3/21 >>   LINES/TUBES:  Trach (pre-existing) >> RUE PICC (pre-existing) >>   CONSULTANTS:     SUBJECTIVE:  Afebrile  Seen by SLP, cleared for regular diet with thin liquids  No acute events overnight   CONSTITUTIONAL: BP (!) 142/93   Pulse 77   Temp 98.3 F (36.8 C) (Oral)   Resp 19   Ht 5\' 6"  (1.676 m)   Wt (!) 166 kg Comment: last documented weight  SpO2 100%   BMI 59.07 kg/m   I/O last 3 completed shifts: In: 1881.4 [P.O.:520; I.V.:1048; IV Piggyback:313.4] Out: 1425 [Urine:1425]     Vent Mode:  PRVC FiO2 (%):  [40 %] 40 % Set Rate:  [12 bmp] 12 bmp Vt Set:  [500 mL] 500 mL PEEP:  [5 cmH20] 5 cmH20 Plateau Pressure:  [16 cmH20-20 cmH20] 16 cmH20  PHYSICAL EXAM: General: morbidly obese female lying in bed in NAD   HEENT: MM pink/moist, trach midline c/d/i, tolerates PMV Neuro: AAOx4, speech clear, MAE, appropriate CV: s1s2 RRR, SR 80's, no m/r/g PULM:  Non-labored on ATC, distant breath sounds anterior, diminished bases  GI: soft, bsx4 active  Extremities: warm/dry, no appreciable edema, LE scarring  Skin: no rashes or lesions  RESOLVED PROBLEM LIST    ASSESSMENT AND PLAN    Acute on chronic hypoxic respiratory failure with tracheostomy 2/2 acute PE Bilateral non-occlusive segmental PE, no central PE. This is her second occurrence of embolism.  She had prior GIB on anticoagulation that required central line / multiple transfusion.  -PRVC 8cc/kg, rate 16, PEEP 5 QHS  -wean O2 for sats > 88% -ATC during daytime  -appreciate SLP assistance with patient care  -follow intermittent CXR   Acute pulmonary embolism Hx Prior Embolism  Echo with limited windows.  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  Has hx of GIB in remote past  -ensure PPI QD while on anticoagulation  -if new bleeding would need IVC filter   Proteus Mirabilis PNA  Currently afebrile and without leukocytosis.  Suspect recurrent aspiration events.  -transition to ceftriaxone, 0000000   Acute metabolic encephalopathy 2/2 hypoxia  and pneumonia Hx of depression, chronic pain, bipolar disorder -minimize all sedating medications  -continue  Home abilify, depakote, norco, effexor, trazodone -continue PRN low dose ativan while inpatient   Hypomagnesemia  -follow Mg+, replace as indicated   DM II (poorly controlled) -SSI -CBG to ACHS   Anemia of chronic disease Appears to be stable, at baseline  -follow CBC  -transfuse for Hgb <7% or active bleeding   Best  Practice / Goals of Care / Disposition.   Diet: NPO Pain/Agitation/Delirium protocol (if indicated): n/a VAP Prevention protocol (if indicated): per protocol  DVT PROPHYLAXIS: Heparin gtt > DOAC GI Prophylaxis: PPI Mobility: Bed rest  Code status: FULL Family communication: Patient updated on plan of care 3/21  Disposition: ICU for vent needs only.  Can go back to [facility] 3/22 if bed available  LABS  Glucose Recent Labs  Lab 12/06/19 1915 12/06/19 1957 12/06/19 2329 12/07/19 0322 12/07/19 0816 12/07/19 1131  GLUCAP 207* 149* 134* 111* 170* 171*    BMET Recent Labs  Lab 12/04/19 1633 12/05/19 0615 12/07/19 0530  NA 141 141 139  K 4.4 3.6 4.6  CL 89* 87* 93*  CO2 44* 43* 36*  BUN 17 16 13   CREATININE 0.61 0.62 0.93  GLUCOSE 152* 104* 130*    Liver Enzymes Recent Labs  Lab 12/04/19 1729 12/05/19 0615 12/07/19 0530  AST 19 14*  --   ALT 13 13  --   ALKPHOS 48 47  --   BILITOT 0.4 0.6  --   ALBUMIN 2.9* 2.6* 2.7*    Electrolytes Recent Labs  Lab 12/04/19 1633 12/05/19 0615 12/06/19 0236 12/07/19 0530  CALCIUM 9.5 9.3  --  9.0  MG  --  1.6* 2.2 2.2  PHOS  --   --   --  4.2    CBC Recent Labs  Lab 12/05/19 0615 12/06/19 0236 12/07/19 0530  WBC 7.6 5.5 4.8  HGB 9.6* 9.7* 9.5*  HCT 32.6* 32.4* 30.8*  PLT 148* 152 166    ABG No results for input(s): PHART, PCO2ART, PO2ART in the last 168 hours.  Coag's No results for input(s): APTT, INR in the last 168 hours.  Sepsis Markers No results for input(s): LATICACIDVEN, PROCALCITON, O2SATVEN in the last 168 hours.  Cardiac Enzymes No results for input(s): TROPONINI, PROBNP in the last 168 hours.    Noe Gens, MSN, NP-C [facility] 12/07/2019, 11:51 AM   Please see [email or web address] for pager details.

## 2019-12-07 NOTE — Progress Notes (Signed)
[facility] Progress Note Patient Name: Zayliah Brendel DOB: [birth year] MRN: [id number]   Date of Service  12/07/2019  HPI/Events of Note  Insulin sliding scale adjustment to be QACHS rather than Q4H.   eICU Interventions  QACHS insulin sliding scale ordered. Prior insulin order d/c'd.     Intervention Category Minor Interventions: Other:  Charlott Rakes 12/07/2019, 10:43 PM

## 2019-12-08 LAB — RENAL FUNCTION PANEL
Albumin: 2.3 g/dL — ABNORMAL LOW (ref 3.5–5.0)
Anion gap: 9 (ref 5–15)
BUN: 10 mg/dL (ref 6–20)
CO2: 38 mmol/L — ABNORMAL HIGH (ref 22–32)
Calcium: 8.9 mg/dL (ref 8.9–10.3)
Chloride: 94 mmol/L — ABNORMAL LOW (ref 98–111)
Creatinine, Ser: 0.53 mg/dL (ref 0.44–1.00)
GFR calc Af Amer: >60 mL/min (ref >60)
GFR calc non Af Amer: >60 mL/min (ref >60)
Glucose, Bld: 136 mg/dL — ABNORMAL HIGH (ref 70–99)
Phosphorus: 2.9 mg/dL (ref 2.5–4.6)
Potassium: 3.7 mmol/L (ref 3.5–5.1)
Sodium: 141 mmol/L (ref 135–145)

## 2019-12-08 LAB — GLUCOSE, CAPILLARY
Glucose-Capillary: 129 mg/dL — ABNORMAL HIGH (ref 70–99)
Glucose-Capillary: 183 mg/dL — ABNORMAL HIGH (ref 70–99)
Glucose-Capillary: 139 mg/dL — ABNORMAL HIGH (ref 70–99)
Glucose-Capillary: 139 mg/dL — ABNORMAL HIGH (ref 70–99)

## 2019-12-08 LAB — CBC
HCT: 28.2 % — ABNORMAL LOW (ref 36.0–46.0)
Hemoglobin: 8.4 g/dL — ABNORMAL LOW (ref 12.0–15.0)
MCH: 27.5 pg (ref 26.0–34.0)
MCHC: 29.8 g/dL — ABNORMAL LOW (ref 30.0–36.0)
MCV: 92.2 fL (ref 80.0–100.0)
Platelets: 140 10*3/uL — ABNORMAL LOW (ref 150–400)
RBC: 3.06 MIL/uL — ABNORMAL LOW (ref 3.87–5.11)
RDW: 14.3 % (ref 11.5–15.5)
WBC: 4.1 10*3/uL (ref 4.0–10.5)
nRBC: 0 % (ref 0.0–0.2)

## 2019-12-08 LAB — MAGNESIUM: Magnesium: 2 mg/dL (ref 1.7–2.4)

## 2019-12-08 LAB — SARS CORONAVIRUS 2 (TAT 6-24 HRS): SARS Coronavirus 2: NEGATIVE

## 2019-12-08 NOTE — TOC Progression Note (Signed)
Transition of Care [facility]) - Progression Note    Patient Details  Name: Kim Jenkins MRN: [id number] Date of Birth: 07/03/1966  Transition of Care [facility]) CM/SW [place], [place] Phone Number: 12/08/2019, 1:10 PM  Clinical Narrative:    CSW contacted pt's SNF [facility].  Pt has a bed available for pt's return to facility.  Pt will need a covid test before returning on Tuesday.  CSW has notified RN Marcene Brawn and physician.  CSW will continue to follow for disposition.        Expected Discharge Plan and Services                                                 Social Determinants of Health (SDOH) Interventions    Readmission Risk Interventions No flowsheet data found.

## 2019-12-08 NOTE — Progress Notes (Signed)
PULMONARY / CRITICAL CARE MEDICINE   NAME:  Kim Jenkins, MRN:  [id number], DOB:  04-28-1966, LOS: 4 ADMISSION DATE:  12/04/2019, CONSULTATION DATE:  12/04/2019 REFERRING MD:  ED Provider, CHIEF COMPLAINT:  Chest pain, PE  BRIEF HISTORY:    54 y/o F with history of OSA/OHS with chronic trach/vent from [facility] with chest pain and dyspnea found to have PE and aspiration pneumonia.   SIGNIFICANT PAST MEDICAL HISTORY   Bipolar Disorder Chronic Pain  Depression  DM II  HTN  Kidney Stones Morbid Obesity  Obstructive Sleep Apnea / Obesity Hypoventilation Syndrome  Panniculitis   SIGNIFICANT EVENTS:  3/18 Admit from [place], found to have bilateral subsegmental nonocclusive PE, started on heparin gtt 3/20 Transitioned to DOAC  STUDIES:    CT angio chest 3/18 >> b/l subsegmental nonocclusive PE, atherosclerosis, small Rt effusion, bibasilar consolidation, debris in airways, mild centrilobular emphysema, old Rt 7th and 8th rib fx's  ECHO 3/19 >> LVEF 60-65%, normal LV function, grade I diastolic dysfunction, RVSP mildly reduced, RV mildly enlarged, wall thickness not well visualized, unable to assess PA pressures.  IVC dilated in size with less than 50% respiratory variability, RA pressure ~ 15 mmHg  CULTURES:  SARS CoV2 PCR 3/18 >> negative Influenza PCR 3/18 >> negative Sputum 3/18 >> proteus mirabilis >> S-ceftriaxone   ANTIBIOTICS:  Zosyn 3/18 >> 3/20 Ceftriaxone 3/21 >>   LINES/TUBES:  Trach (pre-existing) >> RUE PICC (pre-existing) >>   CONSULTANTS:     SUBJECTIVE:  NAEO Stating she is ready to go back to [facility]  On trach collar + PMV and speaking well this morning   CONSTITUTIONAL: BP (!) 162/92   Pulse 87   Temp 98.2 F (36.8 C)   Resp (!) 23   Ht 5\' 6"  (1.676 m)   Wt (!) 166 kg Comment: last documented weight  SpO2 100%   BMI 59.07 kg/m   I/O last 3 completed shifts: In: 2387 [P.O.:840; I.V.:1334.4; IV Piggyback:212.7] Out: 1700 [Urine:1700]     Vent  Mode: PRVC FiO2 (%):  [40 %] 40 % Set Rate:  [12 bmp] 12 bmp Vt Set:  [500 mL] 500 mL PEEP:  [5 cmH20] 5 cmH20  PHYSICAL EXAM: General: Morbidly obese, chronically ill appearing  HEENT: NCAT Trachea midline trach secure  Neuro: AAO x4 following commands PERRLA PULM:  Distant breath sounds. Symmetrical chest expansion on trach collar without accessory muscle use  GI: soft obese ndnt. Distant bowel sounds  Extremities: Symmetrical bulk and tone. No cyanosis or clubbing  Skin: c/d/w, pale   RESOLVED PROBLEM LIST    ASSESSMENT AND PLAN    Acute on chronic hypoxic respiratory failure with tracheostomy 2/2 acute PE Bilateral non-occlusive segmental PE, no central PE. This is her second occurrence of embolism.  She had prior GIB on anticoagulation that required central line / multiple transfusion.   -qHS PRVC, rate 16, PEEP 5 -Trach collar during day -wean O2 for sats > 88% -ATC during daytime  -PRN CXR  -Routine trach care   Acute pulmonary embolism Hx Prior Embolism  Echo with limited windows.  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 -ensure PPI QD while on anticoagulation  -if new bleeding would need IVC filter    Proteus Mirabilis PNA  Currently afebrile and without leukocytosis.  Suspect recurrent aspiration events.  P - ceftriaxone, day 5/7    Acute metabolic encephalopathy 2/2 hypoxia and pneumonia Hx of depression, chronic pain, bipolar disorder -minimize all sedating medications  -continue  Home abilify, depakote, norco, effexor, trazodone -continue PRN low dose ativan while inpatient   Hypomagnesemia  -follow Mg+, replace as indicated   DM II (poorly controlled) - qACHS SSI  Anemia of chronic disease -Relatively stable. Did have drom from 9/5 to 8.4 in interval, without obvious bleeding. Could be iatrogenic in setting of lab draw frequency  -Hx GIB. Now on anticoagulation P -follow CBC  -transfuse for Hgb <7 or  active bleeding  -Monitor for s/sx GIB    Best Practice / Goals of Care / Disposition.   Diet: thin liquid  Pain/Agitation/Delirium protocol (if indicated): n/a VAP Prevention protocol (if indicated): per protocol  DVT PROPHYLAXIS: Xarelto GI Prophylaxis: PPI Mobility: Bed rest  Code status: FULL Family communication: Discussed with pt 3/22 Disposition: ICU due to vent need. Pending [facility] placement   LABS  Glucose Recent Labs  Lab 12/07/19 0322 12/07/19 0816 12/07/19 1131 12/07/19 1522 12/07/19 2104 12/08/19 0739  GLUCAP 111* 170* 171* 138* 133* 129*    BMET Recent Labs  Lab 12/05/19 0615 12/07/19 0530 12/08/19 0616  NA 141 139 141  K 3.6 4.6 3.7  CL 87* 93* 94*  CO2 43* 36* 38*  BUN 16 13 10   CREATININE 0.62 0.93 0.53  GLUCOSE 104* 130* 136*    Liver Enzymes Recent Labs  Lab 12/04/19 1729 12/04/19 1729 12/05/19 0615 12/07/19 0530 12/08/19 0616  AST 19  --  14*  --   --   ALT 13  --  13  --   --   ALKPHOS 48  --  47  --   --   BILITOT 0.4  --  0.6  --   --   ALBUMIN 2.9*   < > 2.6* 2.7* 2.3*   < > = values in this interval not displayed.    Electrolytes Recent Labs  Lab 12/05/19 0615 12/05/19 0615 12/06/19 0236 12/07/19 0530 12/08/19 0616  CALCIUM 9.3  --   --  9.0 8.9  MG 1.6*   < > 2.2 2.2 2.0  PHOS  --   --   --  4.2 2.9   < > = values in this interval not displayed.    CBC Recent Labs  Lab 12/06/19 0236 12/07/19 0530 12/08/19 0616  WBC 5.5 4.8 4.1  HGB 9.7* 9.5* 8.4*  HCT 32.4* 30.8* 28.2*  PLT 152 166 140*    ABG No results for input(s): PHART, PCO2ART, PO2ART in the last 168 hours.  Coag's No results for input(s): APTT, INR in the last 168 hours.  Sepsis Markers No results for input(s): LATICACIDVEN, PROCALCITON, O2SATVEN in the last 168 hours.  Cardiac Enzymes No results for input(s): TROPONINI, PROBNP in the last 168 hours.   Eliseo Gum MSN, AGACNP-BC [place] [id number] If  no answer, [id number] 12/08/2019, 10:31 AM

## 2019-12-09 DIAGNOSIS — J9601 Acute respiratory failure with hypoxia: Secondary | ICD-10-CM

## 2019-12-09 LAB — CBC
HCT: 29 % — ABNORMAL LOW (ref 36.0–46.0)
Hemoglobin: 8.8 g/dL — ABNORMAL LOW (ref 12.0–15.0)
MCH: 27.5 pg (ref 26.0–34.0)
MCHC: 30.3 g/dL (ref 30.0–36.0)
MCV: 90.6 fL (ref 80.0–100.0)
Platelets: 145 10*3/uL — ABNORMAL LOW (ref 150–400)
RBC: 3.2 MIL/uL — ABNORMAL LOW (ref 3.87–5.11)
RDW: 14.4 % (ref 11.5–15.5)
WBC: 4.6 10*3/uL (ref 4.0–10.5)
nRBC: 0 % (ref 0.0–0.2)

## 2019-12-09 LAB — GLUCOSE, CAPILLARY
Glucose-Capillary: 121 mg/dL — ABNORMAL HIGH (ref 70–99)
Glucose-Capillary: 153 mg/dL — ABNORMAL HIGH (ref 70–99)

## 2019-12-09 MED ORDER — SODIUM CHLORIDE 0.9% FLUSH: 10.0000 mL | INTRAVENOUS | Status: DC | PRN

## 2019-12-09 MED ORDER — RIVAROXABAN 15 MG PO TABS: 15.0000 mg | ORAL_TABLET | Freq: Two times a day (BID) | ORAL | Status: DC

## 2019-12-09 MED ORDER — RIVAROXABAN 20 MG PO TABS: 20.0000 mg | ORAL_TABLET | Freq: Every day | ORAL | Status: DC

## 2019-12-09 MED ORDER — SODIUM CHLORIDE 0.9 % IV SOLN: 2.0000 g | INTRAVENOUS | Status: AC

## 2019-12-09 NOTE — TOC Transition Note (Signed)
Transition of Care [facility]) - CM/SW Discharge Note   Patient Details  Name: Kim Jenkins MRN: [id number] Date of Birth: [birth year]  Transition of Care [facility]) CM/SW Contact:  Gabrielle Dare Phone Number: 12/09/2019, 11:39 AM   Clinical Narrative:    Patient will Discharge To: [facility] Anticipated DC Date:12/09/19 Family Notified: left vm for Starlyn Skeans [phone] Transport By: Corey Harold   Per MD patient ready for DC to [facility] . RN, patient, patient's family, and facility notified of DC. Assessment, Fl2/Pasrr, and Discharge Summary sent to facility. RN given number for report ([phone], Room # [id number]). DC packet on chart. Ambulance transport requested for patient for 12:00pm  CSW signing off.  Reed Breech LCSWA [phone]     Final next level of care: Skilled Nursing Facility Barriers to Discharge: No Barriers Identified   Patient Goals and CMS Choice        Discharge Placement              Patient chooses bed at: ([facility]) Patient to be transferred to facility by: [place] Name of family member notified: Starlyn Skeans Patient and family notified of of transfer: 12/09/19  Discharge Plan and Services                                     Social Determinants of Health (SDOH) Interventions     Readmission Risk Interventions No flowsheet data found.

## 2019-12-09 NOTE — NC FL2 (Addendum)
[place] LEVEL OF CARE SCREENING TOOL     IDENTIFICATION  Patient Name: Kim Jenkins Birthdate: September 14, 1966 Sex: female Admission Date (Current Location): 12/04/2019  [facility] and [place] Number:  Herbalist and Address:  [facility], [place] [address], [place] [employer], [place] [address]      Provider Number: [id number]  Attending Physician Name and Address:  Kipp Brood, MD  Relative Name and Phone Number:  Starlyn Skeans [phone]    Current Level of Care: SNF Recommended Level of Care: Vent SNF Prior Approval Number:    Date Approved/Denied:   PASRR Number: [id number]  Discharge Plan: SNF    Current Diagnoses: Patient Active Problem List   Diagnosis Date Noted  . Acute pulmonary embolism ([place]) 12/04/2019  . Hypoxemia   . Pressure injury of skin 05/06/2018  . Acute on chronic respiratory failure with hypoxemia ([place])   . Hypercarbia   . GI bleed 05/05/2018  . Hemorrhagic shock ([place]) 05/05/2018  . Acute blood loss anemia 05/05/2018  . Tracheostomy care ([place]) 05/05/2018  . OSA (obstructive sleep apnea) 05/05/2018  . DM2 (diabetes mellitus, type 2) ([place]) 05/05/2018  . Bipolar 1 disorder ([place]) 05/05/2018  . Chronic pain 05/05/2018  . Depression 05/05/2018  . Acute kidney injury ([place]) 05/05/2018  . Acute upper GI bleed   . Tracheostomy status ([place]) 02/17/2014  . Morbid obesity ([place]) 02/17/2014  . Chronic respiratory failure with hypoxia and hypercapnia ([place]) 01/23/2014  . Acute encephalopathy 01/23/2014    Orientation RESPIRATION BLADDER Height & Weight     Self, Time, Situation, Place  Tracheostomy, Vent(FIO2 40%, 6MM Cuffed [place]) Incontinent Weight: (!) 366 lb (166 kg)(last documented weight) Height:  '5\' 6"'$  (167.6 cm)  BEHAVIORAL SYMPTOMS/MOOD NEUROLOGICAL BOWEL NUTRITION STATUS      Incontinent Diet(See discharge order)  AMBULATORY STATUS COMMUNICATION OF NEEDS Skin   Extensive Assist Verbally([place] Speaking  Valve) Other (Comment)(MASD (moisture associate skin damage on abdomen, pressure injury buttocks right stage)                       Personal Care Assistance Level of Assistance  Bathing, Feeding, Dressing Bathing Assistance: Maximum assistance Feeding assistance: Maximum assistance Dressing Assistance: Maximum assistance     Functional Limitations Info  Sight, Hearing, Speech Sight Info: Impaired Hearing Info: Adequate Speech Info: Impaired(passey muir speaking valve)    SPECIAL CARE FACTORS FREQUENCY                       Contractures Contractures Info: Not present    Additional Factors Info  Code Status, Allergies   Psychotropic Code Status Info: Full Allergies Info: meperidine, cefazolin  abilify, depakote, effexor, trazodone         Current Medications (12/09/2019):  This is the current hospital active medication list Current Facility-Administered Medications  Medication Dose Route Frequency Provider Last Rate Last Admin  . 0.9 %  sodium chloride infusion   Intravenous Continuous Chesley Mires, MD 50 mL/hr at 12/09/19 1000 Rate Verify at 12/09/19 1000  . ARIPiprazole (ABILIFY) tablet 5 mg  5 mg Oral Daily Ollis, Brandi L, NP   5 mg at 12/09/19 4008  . cefTRIAXone (ROCEPHIN) 2 g in sodium chloride 0.9 % 100 mL IVPB  2 g Intravenous Q24H Noe Gens L, NP   Stopped at 12/08/19 1333  . chlorhexidine gluconate (MEDLINE KIT) (PERIDEX) 0.12 % solution 15 mL  15 mL Mouth Rinse BID Chesley Mires, MD  15 mL at 12/08/19 2100  . Chlorhexidine Gluconate Cloth 2 % PADS 6 each  6 each Topical Daily Chesley Mires, MD   6 each at 12/09/19 [phone]  . divalproex (DEPAKOTE ER) 24 hr tablet 1,000 mg  1,000 mg Oral Q12H Ollis, Brandi L, NP   1,000 mg at 12/09/19 5051  . HYDROcodone-acetaminophen (NORCO/VICODIN) 5-325 MG per tablet 1 tablet  1 tablet Oral Q6H PRN Donita Brooks, NP   1 tablet at 12/08/19 2154  . insulin aspart (novoLOG) injection 0-20 Units  0-20 Units Subcutaneous  TID WC Stretch, Marily Lente, MD   3 Units at 12/09/19 0701  . insulin aspart (novoLOG) injection 0-5 Units  0-5 Units Subcutaneous QHS Stretch, Marily Lente, MD      . loratadine (CLARITIN) tablet 10 mg  10 mg Oral Daily Ollis, Brandi L, NP   10 mg at 12/09/19 8335  . LORazepam (ATIVAN) tablet 0.5 mg  0.5 mg Oral Q6H PRN Noe Gens L, NP   0.5 mg at 12/08/19 1855  . MEDLINE mouth rinse  15 mL Mouth Rinse 10 times per day Chesley Mires, MD   15 mL at 12/09/19 0938  . miconazole nitrate (MICATIN) topical powder   Topical BID Chesley Mires, MD   Given at 12/09/19 [phone]  . multivitamin with minerals tablet 1 tablet  1 tablet Oral Daily Noe Gens L, NP   1 tablet at 12/09/19 [phone]  . olopatadine (PATANOL) 0.1 % ophthalmic solution 1 drop  1 drop Both Eyes BID Noe Gens L, NP   1 drop at 12/09/19 0939  . ondansetron (ZOFRAN) injection 4 mg  4 mg Intravenous Q6H PRN Frederik Pear, MD   4 mg at 12/05/19 2126  . pantoprazole (PROTONIX) EC tablet 40 mg  40 mg Oral Daily Ollis, Brandi L, NP   40 mg at 12/09/19 2103  . Rivaroxaban (XARELTO) tablet 15 mg  15 mg Oral BID Ronna Polio, RPH   15 mg at 12/09/19 1281   Followed by  . [START ON 12/28/2019] rivaroxaban (XARELTO) tablet 20 mg  20 mg Oral Daily McCarthy, Megan L, RPH      . sodium chloride flush (NS) 0.9 % injection 10-40 mL  10-40 mL Intracatheter Q12H Chesley Mires, MD   10 mL at 12/09/19 0939  . sodium chloride flush (NS) 0.9 % injection 10-40 mL  10-40 mL Intracatheter PRN Chesley Mires, MD      . traZODone (DESYREL) tablet 150 mg  150 mg Oral QHS Ollis, Brandi L, NP   150 mg at 12/08/19 2148  . venlafaxine XR (EFFEXOR-XR) 24 hr capsule 225 mg  225 mg Oral Q breakfast Ollis, Brandi L, NP   225 mg at 12/09/19 1886     Discharge Medications: Please see discharge summary for a list of discharge medications.  Relevant Imaging Results:  Relevant Lab Results:   Additional Information SS# [id number],   Special Mattress-low air  loss  Carolin Sicks, [place]

## 2019-12-09 NOTE — Discharge Summary (Signed)
Physician Discharge Summary         Patient ID: Kim Jenkins MRN: [id number] DOB/AGE: October 20, 1965 54 y.o.  Admit date: 12/04/2019 Discharge date: 12/09/2019  Discharge Diagnoses:    Acute on chronic hypoxic respiratory failure in the setting of acute pulmonary emboli Tracheostomy dependence Obesity hypoventilation syndrome Obstructive sleep apnea Chronic ventilator dependence Proteus Mirabilis Pneumonia  Acute metabolic encephalopathy History of depression and bipolar disorder History of chronic pain Fluid and electrolyte imbalance Poorly controlled diabetes type 2 with hyperglycemia Anemia of chronic disease  Discharge summary    This is a 54 year old female patient who was admitted admitted from [place] skilled nursing facility 3/18 with chief complaint of chest discomfort and worsening dyspnea.  She is chronically ventilator and tracheostomy dependent.  Diagnostic evaluation with CT angiogram showed bilateral subsegmental nonocclusive pulmonary emboli, small right effusion, bibasilar consolidation and debris and airway.  Echocardiogram showed right ventricular systolic function mildly reduced, the right ventricle was mildly enlarged the IVC was dilated.  She was placed on IV heparin and admitted to the intensive care with a working diagnosis of acute pulmonary emboli as well as ventilator associated pneumonia versus aspiration. She was placed on empiric antibiotics, cultures were sent, IV heparin was initiated as mentioned above.  She continued to improve over the course of her hospitalization and she was transitioned to [place] on 3/20, antibiotics transition to ceftriaxone on 3/21, and given her hemodynamic and respiratory stability with the stable for transfer back to skilled nursing facility on 3/23.  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 Plan Continue nocturnal ventilation Continue routine tracheostomy care Encourage Passy-Muir valve Aerosol trach collar during daytime with pulse oximetry Continue DOAC, given limited mobility and recurrence of pulmonary emboli she will need lifelong anticoagulation  Proteus pneumonia Plan Currently on ceftriaxone, she is on day #5 of 7 planned therapy  Acute metabolic encephalopathy superimposed on underlying depression, bipolar disorder, and chronic pain, encephalopathy has resolved Plan We will discharge her back to [facility] on Abilify, Depakote, Norco, Effexor, and trazodone  Anemia of chronic disease Remote history of GI bleed Plan Continue PPI  DM type II Plan ssi  [facility] tests/ studies   CT angio chest 3/18 >> b/l subsegmental nonocclusive PE, atherosclerosis, small Rt effusion, bibasilar consolidation, debris in airways, mild centrilobular emphysema, old Rt 7th and 8th rib fx's  ECHO 3/19 >> LVEF 60-65%, normal LV function, grade I diastolic dysfunction, RVSP mildly reduced, RV mildly enlarged, wall thickness not well visualized, unable to assess PA pressures.  IVC dilated in size with less than 50% respiratory variability, RA pressure ~ 15 mmHg Procedures   Trach (pre-existing) >> RUE PICC (pre-existing) >>  Culture data/antimicrobials   SARS CoV2 PCR 3/18 >> negative Influenza PCR 3/18 >> negative Sputum 3/18 >>proteus mirabilis >> S-ceftriaxone   Zosyn 3/18 >> 3/20 Ceftriaxone 3/21 >>    Consults     Discharge Exam: Blood Pressure 113/87   Pulse 77   Temperature (Abnormal) 97.4 F (36.3 C)   Respiration (Abnormal) 22   Height 5\' 6"  (1.676 m)   Weight (Abnormal) 166 kg Comment: last documented weight  Oxygen Saturation 99%   Body Mass Index 59.07 kg/m   This is a very pleasant 54 year old white female she is currently lying in bed and in  no acute  distress HEENT neck is large, mucous membranes moist, pupils equal reactive.  Has a size 6 proximal XLT tracheostomy which is cuffed, currently cuff deflated her phonation is excellent with Passy-Muir valve currently on 40% ATC  pulmonary: Clear to auscultation diminished bases no accessory use Cardiac regular rate and rhythm without murmur rub or gallop Extremities are warm dry deconditioned lower extremities poor muscle mass pulses are strong cap refill brisk Neuro awake oriented no focal deficits appreciated GU clear yellow  Labs at discharge   Lab Results  Component Value Date   CREATININE 0.53 12/08/2019   BUN 10 12/08/2019   NA 141 12/08/2019   K 3.7 12/08/2019   CL 94 (L) 12/08/2019   CO2 38 (H) 12/08/2019   Lab Results  Component Value Date   WBC 4.6 12/09/2019   HGB 8.8 (L) 12/09/2019   HCT 29.0 (L) 12/09/2019   MCV 90.6 12/09/2019   PLT 145 (L) 12/09/2019   Lab Results  Component Value Date   ALT 13 12/05/2019   AST 14 (L) 12/05/2019   ALKPHOS 47 12/05/2019   BILITOT 0.6 12/05/2019   Lab Results  Component Value Date   INR 1.06 11/09/2018   INR 1.04 05/05/2018   INR 1.01 05/05/2018    Current radiological studies    No results found.  Disposition:    Discharge disposition: 03-Skilled Nursing Facility       Discharge Instructions    Diet - low sodium heart healthy   Complete by: As directed    Increase activity slowly   Complete by: As directed       Allergies as of 12/09/2019    Allergen Reactions Comment   Meperidine And Related Shortness Of Breath    Cefazolin Other (See Comments) Per MAR      Medication List    Take these medications   ARIPiprazole 5 MG tablet Commonly known as: ABILIFY Take 1 tablet (5 mg total) by mouth daily.   Bydureon 2 MG Pen Generic drug: Exenatide ER Inject 2 mg into the skin every Thursday.   cefTRIAXone 2 g in sodium chloride 0.9 % 100 mL Inject 2 g into the vein daily for 2 days.     Cholecalciferol 25 MCG (1000 UT) tablet Take 1,000 Units by mouth daily.   dextromethorphan-guaiFENesin 10-100 MG/5ML liquid Commonly known as: ROBITUSSIN-DM Take 5 mLs by mouth every 6 (six) hours as needed for cough.   diazepam 5 MG tablet Commonly known as: VALIUM Take 5 mg by mouth every 12 (twelve) hours as needed for anxiety or muscle spasms.   divalproex 500 MG 24 hr tablet Commonly known as: DEPAKOTE ER Take 2 tablets (1,000 mg total) by mouth every 12 (twelve) hours.   ergocalciferol 1.25 MG (50000 UT) capsule Commonly known as: VITAMIN D2 Take 50,000 Units by mouth once a week.   famotidine 20 MG tablet Commonly known as: PEPCID Take 20 mg by mouth daily.   fluticasone 50 MCG/ACT nasal spray Commonly known as: FLONASE Place 2 sprays into both nostrils daily.   HYDROcodone-acetaminophen 5-325 MG tablet Commonly known as: NORCO/VICODIN Take 1 tablet by mouth every 6 (six) hours as needed for moderate pain. What changed:   how much to take  when to take this   insulin glargine 100 UNIT/ML injection Commonly known as: LANTUS Inject 20 Units into the skin daily.   ipratropium-albuterol 0.5-2.5 (3) MG/3ML Soln Commonly known as: DUONEB Inhale 3 mLs into the lungs every 4 (four) hours  as needed for shortness of breath.   loperamide 2 MG tablet Commonly known as: IMODIUM A-D Take 2 mg by mouth 4 (four) times daily as needed for diarrhea or loose stools.   loratadine 10 MG tablet Commonly known as: CLARITIN Take 10 mg by mouth daily.   metoprolol tartrate 50 MG tablet Commonly known as: LOPRESSOR Take 50 mg by mouth 2 (two) times daily.   multivitamin with minerals Tabs tablet Take 1 tablet by mouth daily.   nitroGLYCERIN 0.4 MG SL tablet Commonly known as: NITROSTAT Place 0.4 mg under the tongue every 5 (five) minutes as needed for chest pain.   olopatadine 0.1 % ophthalmic solution Commonly known as: PATANOL Place 1 drop into both eyes 2 (two)  times daily.   ondansetron 4 MG tablet Commonly known as: ZOFRAN Take 4 mg by mouth every 6 (six) hours as needed for nausea or vomiting.   pantoprazole 40 MG tablet Commonly known as: PROTONIX Take 40 mg by mouth daily.   Rivaroxaban 15 MG Tabs tablet Commonly known as: XARELTO Take 1 tablet (15 mg total) by mouth 2 (two) times daily.   rivaroxaban 20 MG Tabs tablet Commonly known as: XARELTO Take 1 tablet (20 mg total) by mouth daily. Start taking on: December 28, 2019   sodium chloride 0.65 % Soln nasal spray Commonly known as: OCEAN Place 2 sprays into both nostrils as needed for congestion.   sodium chloride flush 0.9 % Soln Commonly known as: NS 10-40 mLs by Intracatheter route as needed (flush).   Systane Balance 0.6 % Soln Generic drug: Propylene Glycol Place 1 drop into both eyes every 6 (six) hours as needed.   torsemide 20 MG tablet Commonly known as: DEMADEX Take 20 mg by mouth daily.   traZODone 150 MG tablet Commonly known as: DESYREL Take 1 tablet (150 mg total) by mouth at bedtime.   venlafaxine XR 75 MG 24 hr capsule Commonly known as: EFFEXOR-XR Take 225 mg by mouth daily with breakfast.        Follow-up appointment   Not indicated  Discharge Condition:    good  Physician Statement:   The Patient was personally examined, the discharge assessment and plan has been personally reviewed and I agree with ACNP Liberti Appleton's assessment and plan. 32 minutes of time have been dedicated to discharge assessment, planning and discharge instructions.   Signed: Clementeen Graham 12/09/2019, 9:40 AM

## 2019-12-27 ENCOUNTER — Other Ambulatory Visit: Payer: Self-pay

## 2019-12-27 ENCOUNTER — Emergency Department (HOSPITAL_COMMUNITY): Payer: Medicare Other

## 2019-12-27 ENCOUNTER — Encounter (HOSPITAL_COMMUNITY): Payer: Self-pay | Admitting: Emergency Medicine

## 2019-12-27 ENCOUNTER — Inpatient Hospital Stay (HOSPITAL_COMMUNITY)
Admission: EM | Admit: 2019-12-27 | Discharge: 2019-12-29 | DRG: 208 | Disposition: A | Discharge status: Other Acute Inpatient Hospital | Payer: Medicare Other | Source: Other Acute Inpatient Hospital | Attending: Pulmonary Disease | Admitting: Pulmonary Disease

## 2019-12-27 DIAGNOSIS — E119 Type 2 diabetes mellitus without complications: Secondary | ICD-10-CM | POA: Diagnosis not present

## 2019-12-27 DIAGNOSIS — J9622 Acute and chronic respiratory failure with hypercapnia: Secondary | ICD-10-CM | POA: Diagnosis present

## 2019-12-27 DIAGNOSIS — J156 Pneumonia due to other aerobic Gram-negative bacteria: Principal | ICD-10-CM | POA: Diagnosis present

## 2019-12-27 DIAGNOSIS — G8929 Other chronic pain: Secondary | ICD-10-CM | POA: Diagnosis present

## 2019-12-27 DIAGNOSIS — Z930 Tracheostomy status: Secondary | ICD-10-CM | POA: Diagnosis not present

## 2019-12-27 DIAGNOSIS — J432 Centrilobular emphysema: Secondary | ICD-10-CM | POA: Diagnosis not present

## 2019-12-27 DIAGNOSIS — I110 Hypertensive heart disease with heart failure: Secondary | ICD-10-CM | POA: Diagnosis present

## 2019-12-27 DIAGNOSIS — E873 Alkalosis: Secondary | ICD-10-CM | POA: Diagnosis present

## 2019-12-27 DIAGNOSIS — Z6841 Body Mass Index (BMI) 40.0 and over, adult: Secondary | ICD-10-CM | POA: Diagnosis not present

## 2019-12-27 DIAGNOSIS — J9690 Respiratory failure, unspecified, unspecified whether with hypoxia or hypercapnia: Secondary | ICD-10-CM | POA: Diagnosis present

## 2019-12-27 DIAGNOSIS — Z452 Encounter for adjustment and management of vascular access device: Secondary | ICD-10-CM

## 2019-12-27 DIAGNOSIS — R52 Pain, unspecified: Secondary | ICD-10-CM

## 2019-12-27 DIAGNOSIS — F319 Bipolar disorder, unspecified: Secondary | ICD-10-CM | POA: Diagnosis present

## 2019-12-27 DIAGNOSIS — J9621 Acute and chronic respiratory failure with hypoxia: Secondary | ICD-10-CM | POA: Diagnosis not present

## 2019-12-27 DIAGNOSIS — Z87442 Personal history of urinary calculi: Secondary | ICD-10-CM | POA: Diagnosis not present

## 2019-12-27 DIAGNOSIS — Z20822 Contact with and (suspected) exposure to covid-19: Secondary | ICD-10-CM | POA: Diagnosis present

## 2019-12-27 DIAGNOSIS — Z79891 Long term (current) use of opiate analgesic: Secondary | ICD-10-CM | POA: Diagnosis not present

## 2019-12-27 DIAGNOSIS — I5032 Chronic diastolic (congestive) heart failure: Secondary | ICD-10-CM | POA: Diagnosis present

## 2019-12-27 DIAGNOSIS — E662 Morbid (severe) obesity with alveolar hypoventilation: Secondary | ICD-10-CM | POA: Diagnosis not present

## 2019-12-27 DIAGNOSIS — D649 Anemia, unspecified: Secondary | ICD-10-CM | POA: Diagnosis present

## 2019-12-27 DIAGNOSIS — J438 Other emphysema: Secondary | ICD-10-CM | POA: Diagnosis present

## 2019-12-27 DIAGNOSIS — Z79899 Other long term (current) drug therapy: Secondary | ICD-10-CM | POA: Diagnosis not present

## 2019-12-27 DIAGNOSIS — J189 Pneumonia, unspecified organism: Secondary | ICD-10-CM

## 2019-12-27 DIAGNOSIS — Z86711 Personal history of pulmonary embolism: Secondary | ICD-10-CM

## 2019-12-27 DIAGNOSIS — Z9911 Dependence on respirator [ventilator] status: Secondary | ICD-10-CM | POA: Diagnosis not present

## 2019-12-27 DIAGNOSIS — Z7901 Long term (current) use of anticoagulants: Secondary | ICD-10-CM

## 2019-12-27 DIAGNOSIS — J9611 Chronic respiratory failure with hypoxia: Secondary | ICD-10-CM | POA: Diagnosis present

## 2019-12-27 LAB — POCT I-STAT 7, (LYTES, BLD GAS, ICA,H+H)
Acid-Base Excess: 22 mmol/L — ABNORMAL HIGH (ref 0.0–2.0)
Bicarbonate: 48.2 mmol/L — ABNORMAL HIGH (ref 20.0–28.0)
Calcium, Ion: 1.25 mmol/L (ref 1.15–1.40)
HCT: 31 % — ABNORMAL LOW (ref 36.0–46.0)
Hemoglobin: 10.5 g/dL — ABNORMAL LOW (ref 12.0–15.0)
O2 Saturation: 95 %
Patient temperature: 98.9
Potassium: 3.8 mmol/L (ref 3.5–5.1)
Sodium: 138 mmol/L (ref 135–145)
TCO2: >50 mmol/L — ABNORMAL HIGH (ref 22–32)
pCO2 arterial: 60.9 mmHg — ABNORMAL HIGH (ref 32.0–48.0)
pH, Arterial: 7.507 — ABNORMAL HIGH (ref 7.350–7.450)
pO2, Arterial: 75 mmHg — ABNORMAL LOW (ref 83.0–108.0)

## 2019-12-27 LAB — TROPONIN I (HIGH SENSITIVITY)
Troponin I (High Sensitivity): 6 ng/L (ref <18)
Troponin I (High Sensitivity): 5 ng/L (ref <18)

## 2019-12-27 LAB — URINALYSIS, ROUTINE W REFLEX MICROSCOPIC
Bacteria, UA: NONE SEEN
Bilirubin Urine: NEGATIVE
Glucose, UA: NEGATIVE mg/dL
Ketones, ur: NEGATIVE mg/dL
Leukocytes,Ua: NEGATIVE
Nitrite: NEGATIVE
Protein, ur: 30 mg/dL — AB
Specific Gravity, Urine: 1.02 (ref 1.005–1.030)
pH: 7 (ref 5.0–8.0)

## 2019-12-27 LAB — CBC WITH DIFFERENTIAL/PLATELET
Abs Immature Granulocytes: 0.05 10*3/uL (ref 0.00–0.07)
Basophils Absolute: 0 10*3/uL (ref 0.0–0.1)
Basophils Relative: 0 %
Eosinophils Absolute: 0.1 10*3/uL (ref 0.0–0.5)
Eosinophils Relative: 1 %
HCT: 30.3 % — ABNORMAL LOW (ref 36.0–46.0)
Hemoglobin: 8.9 g/dL — ABNORMAL LOW (ref 12.0–15.0)
Immature Granulocytes: 1 %
Lymphocytes Relative: 23 %
Lymphs Abs: 1.8 10*3/uL (ref 0.7–4.0)
MCH: 27.7 pg (ref 26.0–34.0)
MCHC: 29.4 g/dL — ABNORMAL LOW (ref 30.0–36.0)
MCV: 94.4 fL (ref 80.0–100.0)
Monocytes Absolute: 0.9 10*3/uL (ref 0.1–1.0)
Monocytes Relative: 12 %
Neutro Abs: 4.9 10*3/uL (ref 1.7–7.7)
Neutrophils Relative %: 63 %
Platelets: 185 10*3/uL (ref 150–400)
RBC: 3.21 MIL/uL — ABNORMAL LOW (ref 3.87–5.11)
RDW: 16.2 % — ABNORMAL HIGH (ref 11.5–15.5)
WBC: 7.8 10*3/uL (ref 4.0–10.5)
nRBC: 0.3 % — ABNORMAL HIGH (ref 0.0–0.2)

## 2019-12-27 LAB — COMPREHENSIVE METABOLIC PANEL
ALT: 11 U/L (ref 0–44)
AST: 17 U/L (ref 15–41)
Albumin: 2.7 g/dL — ABNORMAL LOW (ref 3.5–5.0)
Alkaline Phosphatase: 40 U/L (ref 38–126)
Anion gap: 8 (ref 5–15)
BUN: 11 mg/dL (ref 6–20)
CO2: 43 mmol/L — ABNORMAL HIGH (ref 22–32)
Calcium: 9.1 mg/dL (ref 8.9–10.3)
Chloride: 87 mmol/L — ABNORMAL LOW (ref 98–111)
Creatinine, Ser: 0.59 mg/dL (ref 0.44–1.00)
GFR calc Af Amer: >60 mL/min (ref >60)
GFR calc non Af Amer: >60 mL/min (ref >60)
Glucose, Bld: 138 mg/dL — ABNORMAL HIGH (ref 70–99)
Potassium: 3.8 mmol/L (ref 3.5–5.1)
Sodium: 138 mmol/L (ref 135–145)
Total Bilirubin: 0.4 mg/dL (ref 0.3–1.2)
Total Protein: 7.2 g/dL (ref 6.5–8.1)

## 2019-12-27 NOTE — Progress Notes (Signed)
Patient tried off of vent and on room air per MD. Patient's oxygen saturations dropped down to low 80s-high 70s before being placed back on vent.

## 2019-12-27 NOTE — ED Triage Notes (Addendum)
Pt BIB [facility] from [facility]. Pt has a trach and is generally vented at night. Staff reports pt has been requiring continuous vent use all day, with room air sats in the 70s. Pt alert, answering questions appropriately. Tested for COVID on Monday, result negative.

## 2019-12-27 NOTE — ED Provider Notes (Signed)
[facility] EMERGENCY DEPARTMENT Provider Note  CSN: [id number] Arrival date & time: 12/27/19 2021    History Chief Complaint  Patient presents with  . Shortness of Breath    HPI  Kim Jenkins is a 54 y.o. female brought to the ED via [place] from [facility]. She has a history of chronic respiratory failure due to OSA/OHS with trach and nocturnal vent dependence. She was recently admitted for CP/SOB found to have PE and bilateral PNA. Finished a course of Abx and placed on DOAC. Per staff at [facility] she was in her normal state of health last night, slept well on the vent per usual but during the day today was my somnolent, less responsive and hypoxic when off the vent. She was placed back on the vent during the day and improved some but still not back to her baseline. She is not had any fever or cough recently. Has been eating and drinking well. She had a neg Covid test on Monday this week. History is difficult because patient is on vent and unable to speak.   Past Medical History:  Diagnosis Date  . Bipolar 1 disorder ([place])   . Chronic pain   . Depression   . Diabetes mellitus ([place])   . Hypertension   . Kidney stones   . Morbid obesity ([place])   . Obesity hypoventilation syndrome ([place])   . Obstructive sleep apnea   . Panniculitis     Past Surgical History:  Procedure Laterality Date  . ESOPHAGOGASTRODUODENOSCOPY N/A 05/06/2018   Procedure: ESOPHAGOGASTRODUODENOSCOPY (EGD);  Surgeon: Irving Copas., MD;  Location: [facility];  Service: Gastroenterology;  Laterality: N/A;  . RIGHT OOPHORECTOMY    . TRACHEOSTOMY    . VENTRAL HERNIA REPAIR      No family history on file.  Social History   Tobacco Use  . Smoking status: Never Smoker  . Smokeless tobacco: Never Used  Substance Use Topics  . Alcohol use: No  . Drug use: No     Home Medications Prior to Admission medications   Medication Sig Start Date End Date Taking? Authorizing Provider  ARIPiprazole  (ABILIFY) 5 MG tablet Take 1 tablet (5 mg total) by mouth daily. 09/13/18  Yes Ward, Ozella Almond, PA-C  Cholecalciferol (VITAMIN D-3) 25 MCG (1000 UT) CAPS Take 1,000 Units by mouth daily.   Yes [provider]  dextromethorphan-guaiFENesin (ROBITUSSIN-DM) 10-100 MG/5ML liquid Take 5 mLs by mouth every 6 (six) hours.    Yes [provider]  diazepam (VALIUM) 5 MG tablet Take 5 mg by mouth every 12 (twelve) hours.    Yes [provider]  divalproex (DEPAKOTE) 500 MG DR tablet Take 1,000 mg by mouth every 12 (twelve) hours.   Yes [provider]  ergocalciferol (VITAMIN D2) 50000 units capsule Take 50,000 Units by mouth every Tuesday.    Yes [provider]  Exenatide ER (BYDUREON) 2 MG PEN Inject 2 mg into the skin every Thursday.   Yes [provider]  famotidine (PEPCID) 20 MG tablet Take 20 mg by mouth daily.    Yes [provider]  fluticasone (FLONASE) 50 MCG/ACT nasal spray Place 2 sprays into both nostrils at bedtime.    Yes [provider]  HYDROcodone-acetaminophen (NORCO/VICODIN) 5-325 MG tablet Take 1 tablet by mouth every 6 (six) hours as needed for moderate pain. Patient taking differently: Take 2 tablets by mouth every 6 (six) hours as needed (for pain).  05/12/19  Yes Carmin Muskrat, MD  insulin glargine (LANTUS SOLOSTAR) 100 UNIT/ML Solostar Pen Inject 20 Units into the skin daily.   Yes [provider]  ipratropium-albuterol (DUONEB) 0.5-2.5 (3) MG/3ML SOLN Inhale 3 mLs into the lungs 4 (four) times daily as needed (for dyspnea).  10/20/19  Yes [provider]  loperamide (IMODIUM A-D) 2 MG tablet Take 2 mg by mouth every 6 (six) hours as needed for diarrhea or loose stools.    Yes [provider]  loratadine (CLARITIN) 10 MG tablet Take 10 mg by mouth daily.    Yes [provider]  metoprolol tartrate (LOPRESSOR) 50 MG tablet Take 50 mg by mouth every 12 (twelve) hours.    Yes  [provider]  Multiple Vitamin (MULTIVITAMIN WITH MINERALS) TABS tablet Take 1 tablet by mouth daily.   Yes [provider]  nitroGLYCERIN (NITROSTAT) 0.4 MG SL tablet Place 0.4 mg under the tongue every 5 (five) minutes x 3 doses as needed for chest pain (AND CALL PROVIDER).    Yes [provider]  olopatadine (PATANOL) 0.1 % ophthalmic solution Place 1 drop into both eyes 2 (two) times daily.   Yes [provider]  ondansetron (ZOFRAN-ODT) 4 MG disintegrating tablet Take 4 mg by mouth every 6 (six) hours as needed for nausea or vomiting (DISSOLVE ORALLY).   Yes [provider]  pantoprazole (PROTONIX) 40 MG tablet Take 40 mg by mouth daily.    Yes [provider]  Propylene Glycol (SYSTANE BALANCE) 0.6 % SOLN Place 1 drop into both eyes every 6 (six) hours.    Yes [provider]  Rivaroxaban (XARELTO) 15 MG TABS tablet Take 1 tablet (15 mg total) by mouth 2 (two) times daily. Patient taking differently: Take 15 mg by mouth See admin instructions. Take 15 mg by mouth two times a day for 18 days, starting on 12/09/2019 12/09/19  Yes Erick Colace, NP  sodium chloride (OCEAN) 0.65 % SOLN nasal spray Place 2 sprays into both nostrils daily as needed for congestion (or allergic rhinitis).    Yes [provider]  torsemide (DEMADEX) 20 MG tablet Take 20 mg by mouth daily.   Yes [provider]  traZODone (DESYREL) 150 MG tablet Take 1 tablet (150 mg total) by mouth at bedtime. 09/13/18  Yes Ward, Ozella Almond, PA-C  venlafaxine XR (EFFEXOR-XR) 75 MG 24 hr capsule Take 225 mg by mouth daily with breakfast.   Yes [provider]  divalproex (DEPAKOTE ER) 500 MG 24 hr tablet Take 2 tablets (1,000 mg total) by mouth every 12 (twelve) hours. Patient not taking: Reported on 12/28/2019 09/13/18   Ward, Ozella Almond, PA-C  rivaroxaban (XARELTO) 20 MG TABS tablet Take 1 tablet (20 mg total) by mouth daily. 12/28/19    Erick Colace, NP  sodium chloride flush (NS) 0.9 % SOLN 10-40 mLs by Intracatheter route as needed (flush). Patient not taking: Reported on 12/28/2019 12/09/19   Erick Colace, NP     Allergies    Meperidine and related and Cefazolin   Review of Systems   Review of Systems  Unable to perform ROS: Acuity of condition     Physical Exam BP (!) 113/96 (BP Location: Left Arm)   Pulse 85   Temp 98.9 F (37.2 C) (Oral)   Resp 12   SpO2 100%   Physical Exam Constitutional:      Appearance: Normal appearance. She is obese.  HENT:     Head: Normocephalic and atraumatic.  Nose: Nose normal.     Mouth/Throat:     Mouth: Mucous membranes are moist.  Eyes:     Extraocular Movements: Extraocular movements intact.     Conjunctiva/sclera: Conjunctivae normal.  Neck:     Comments: Tracheostomy in place Cardiovascular:     Rate and Rhythm: Normal rate.  Pulmonary:     Effort: Pulmonary effort is normal. No respiratory distress.     Breath sounds: Rhonchi present.  Abdominal:     General: Abdomen is flat.     Palpations: Abdomen is soft.     Tenderness: There is no abdominal tenderness.  Musculoskeletal:        General: No swelling. Normal range of motion.     Cervical back: Neck supple.  Skin:    General: Skin is warm and dry.  Neurological:     General: No focal deficit present.     Mental Status: She is alert.  Psychiatric:        Mood and Affect: Mood normal.      ED Results / Procedures / Treatments   Labs (all labs ordered are listed, but only abnormal results are displayed) Labs Reviewed  COMPREHENSIVE METABOLIC PANEL - Abnormal; Notable for the following components:      Result Value   Chloride 87 (*)    CO2 43 (*)    Glucose, Bld 138 (*)    Albumin 2.7 (*)    All other components within normal limits  CBC WITH DIFFERENTIAL/PLATELET - Abnormal; Notable for the following components:   RBC 3.21 (*)    Hemoglobin 8.9 (*)    HCT 30.3 (*)    MCHC 29.4  (*)    RDW 16.2 (*)    nRBC 0.3 (*)    All other components within normal limits  URINALYSIS, ROUTINE W REFLEX MICROSCOPIC - Abnormal; Notable for the following components:   Hgb urine dipstick SMALL (*)    Protein, ur 30 (*)    All other components within normal limits  CBC - Abnormal; Notable for the following components:   RBC 2.96 (*)    Hemoglobin 8.2 (*)    HCT 27.9 (*)    MCHC 29.4 (*)    RDW 16.4 (*)    All other components within normal limits  BASIC METABOLIC PANEL - Abnormal; Notable for the following components:   Potassium 3.2 (*)    Chloride 88 (*)    CO2 42 (*)    Glucose, Bld 142 (*)    All other components within normal limits  MAGNESIUM - Abnormal; Notable for the following components:   Magnesium 1.5 (*)    All other components within normal limits  PHOSPHORUS - Abnormal; Notable for the following components:   Phosphorus 1.3 (*)    All other components within normal limits  GLUCOSE, CAPILLARY - Abnormal; Notable for the following components:   Glucose-Capillary 147 (*)    All other components within normal limits  GLUCOSE, CAPILLARY - Abnormal; Notable for the following components:   Glucose-Capillary 130 (*)    All other components within normal limits  GLUCOSE, CAPILLARY - Abnormal; Notable for the following components:   Glucose-Capillary 133 (*)    All other components within normal limits  POCT I-STAT 7, (LYTES, BLD GAS, ICA,H+H) - Abnormal; Notable for the following components:   pH, Arterial 7.507 (*)    pCO2 arterial 60.9 (*)    pO2, Arterial 75.0 (*)    Bicarbonate 48.2 (*)    TCO2 >50 (*)  Acid-Base Excess 22.0 (*)    HCT 31.0 (*)    Hemoglobin 10.5 (*)    All other components within normal limits  POCT I-STAT 7, (LYTES, BLD GAS, ICA,H+H) - Abnormal; Notable for the following components:   pH, Arterial 7.495 (*)    pCO2 arterial 61.7 (*)    pO2, Arterial 51.0 (*)    Bicarbonate 47.4 (*)    TCO2 49 (*)    Acid-Base Excess 21.0 (*)     Potassium 3.3 (*)    HCT 31.0 (*)    Hemoglobin 10.5 (*)    All other components within normal limits  RESPIRATORY PANEL BY RT PCR (FLU A&B, COVID)  RESPIRATORY PANEL BY PCR  MRSA PCR SCREENING  CULTURE, RESPIRATORY  HIV ANTIBODY (ROUTINE TESTING W REFLEX)  MAGNESIUM  I-STAT ARTERIAL BLOOD GAS, ED  TROPONIN I (HIGH SENSITIVITY)  TROPONIN I (HIGH SENSITIVITY)    EKG None  Radiology DG CHEST PORT 1 VIEW  Result Date: 12/28/2019 CLINICAL DATA:  PICC line EXAM: PORTABLE CHEST 1 VIEW COMPARISON:  12/27/2019 FINDINGS: Tracheostomy tube remains in place. Right upper extremity central venous tip overlies the SVC. Incompletely visualized lung bases. Airspace disease at the right base. IMPRESSION: 1.  Right upper extremity catheter tip overlies the SVC 2. Incompletely included lung bases. Airspace disease in the right base Electronically Signed   By: Donavan Foil M.D.   On: 12/28/2019 02:48   DG Chest Port 1 View  Result Date: 12/27/2019 CLINICAL DATA:  Hypoxia low oxygen saturation EXAM: PORTABLE CHEST 1 VIEW COMPARISON:  12/05/2019 FINDINGS: Tracheostomy tube is in place. Right upper extremity central venous catheter tip over the SVC. Enlarged cardiomediastinal silhouette. Subsegmental atelectasis at the left base. Mild right basilar opacity. Aortic atherosclerosis. No pneumothorax. IMPRESSION: 1. Persistent opacity at the right base may reflect atelectasis or pneumonia. Possible small effusion on the right 2. Subsegmental atelectasis at the left base 3. Enlarged cardiomediastinal silhouette Electronically Signed   By: Donavan Foil M.D.   On: 12/27/2019 21:17    Procedures Procedures  Medications Ordered in the ED Medications  docusate sodium (COLACE) capsule 100 mg (has no administration in time range)  polyethylene glycol (MIRALAX / GLYCOLAX) packet 17 g (has no administration in time range)  pantoprazole (PROTONIX) injection 40 mg (40 mg Intravenous Given 12/28/19 0258)  azithromycin  (ZITHROMAX) 500 mg in sodium chloride 0.9 % 250 mL IVPB ( Intravenous Stopped 12/28/19 0440)  ipratropium-albuterol (DUONEB) 0.5-2.5 (3) MG/3ML nebulizer solution 3 mL (3 mLs Nebulization Given 12/28/19 0735)  predniSONE (DELTASONE) tablet 40 mg (40 mg Oral Given 12/28/19 0805)  ceFEPIme (MAXIPIME) 2 g in sodium chloride 0.9 % 100 mL IVPB (has no administration in time range)  sodium chloride flush (NS) 0.9 % injection 10-40 mL (20 mLs Intracatheter Given 12/28/19 0327)  sodium chloride flush (NS) 0.9 % injection 10-40 mL (has no administration in time range)  Chlorhexidine Gluconate Cloth 2 % PADS 6 each (6 each Topical Given 12/28/19 0130)  HYDROcodone-acetaminophen (NORCO/VICODIN) 5-325 MG per tablet 1 tablet (1 tablet Oral Given 12/28/19 0805)  metoprolol tartrate (LOPRESSOR) tablet 50 mg (has no administration in time range)  torsemide (DEMADEX) tablet 20 mg (has no administration in time range)  traZODone (DESYREL) tablet 150 mg (has no administration in time range)  venlafaxine XR (EFFEXOR-XR) 24 hr capsule 225 mg (225 mg Oral Given 12/28/19 0806)  famotidine (PEPCID) tablet 20 mg (has no administration in time range)  ondansetron (ZOFRAN-ODT) disintegrating tablet 4 mg (has no administration  in time range)  divalproex (DEPAKOTE) DR tablet 1,000 mg (has no administration in time range)  fluticasone (FLONASE) 50 MCG/ACT nasal spray 2 spray (has no administration in time range)  loratadine (CLARITIN) tablet 10 mg (has no administration in time range)  sodium chloride (OCEAN) 0.65 % nasal spray 2 spray (has no administration in time range)  olopatadine (PATANOL) 0.1 % ophthalmic solution 1 drop (has no administration in time range)  polyvinyl alcohol (LIQUIFILM TEARS) 1.4 % ophthalmic solution 1 drop (1 drop Both Eyes Given 12/28/19 0514)  rivaroxaban (XARELTO) tablet 20 mg (has no administration in time range)  chlorhexidine gluconate (MEDLINE KIT) (PERIDEX) 0.12 % solution 15 mL (15 mLs Mouth  Rinse Given 12/28/19 0800)  MEDLINE mouth rinse (15 mLs Mouth Rinse Given 12/28/19 0514)  magnesium sulfate 6 g in sodium chloride 0.9 % 250 mL ( Intravenous Rate/Dose Verify 12/28/19 0900)  potassium PHOSPHATE 30 mmol in dextrose 5 % 500 mL infusion ( Intravenous Rate/Dose Verify 12/28/19 0900)  ceFEPIme (MAXIPIME) 2 g in sodium chloride 0.9 % 100 mL IVPB ( Intravenous Stopped 12/28/19 0331)     ED Course  I have reviewed the triage vital signs and the nursing notes.  Pertinent labs & imaging results that were available during my care of the patient were reviewed by me and considered in my medical decision making (see chart for details).  Clinical Course as of Dec 28 1007  Sat Dec 27, 2019  2110 CBC shows anemia, at baseline   [CS]  2136 ABG shows, mixed respiratory acidosis and metabolic alkalosis.    [CS]  2223 CXR images and report reviewed. Persistent opacity in R lung base, but patient has not had any cough or fever and her WBC is normal. She has had her vent come disconnected from her vent once or twice and did not have any hypoxia in the ED. Will as RT to take her off vent and will see how she does.    [CS]  2232 Patient did not tolerate being off vent, SpO2 dropped to upper 80s. Will discuss with ICU.    [CS]  2336 Spoke with Dr. Lucile Shutters, ICU who will arrange for the patient to be admitted.    [CS]    Clinical Course User Index [CS] Truddie Hidden, MD    MDM Rules/Calculators/A&P MDM Number of Diagnoses or Management Options Diagnosis management comments: Vent dependent patient here from [facility] with increased somnolence, hypoxia and vent dependence during the day today.  She typically is on room air during the day and only needs her vent at night.  Vitals here are unremarkable, plan labs and imaging.     Amount and/or Complexity of Data Reviewed Clinical lab tests: ordered and reviewed Tests in the radiology section of CPT: ordered and reviewed Decide to obtain previous  medical records or to obtain history from someone other than the patient: yes Obtain history from someone other than the patient: yes Review and summarize past medical records: yes Independent visualization of images, tracings, or specimens: yes  Risk of Complications, Morbidity, and/or Mortality Presenting problems: high Diagnostic procedures: high Management options: high    Final Clinical Impression(s) / ED Diagnoses Final diagnoses:  Acute on chronic respiratory failure with hypoxia [facility])    Rx / DC Orders ED Discharge Orders    None       Truddie Hidden, MD 12/28/19 1009

## 2019-12-28 ENCOUNTER — Inpatient Hospital Stay (HOSPITAL_COMMUNITY): Payer: Medicare Other

## 2019-12-28 DIAGNOSIS — E662 Morbid (severe) obesity with alveolar hypoventilation: Secondary | ICD-10-CM | POA: Diagnosis present

## 2019-12-28 DIAGNOSIS — J9621 Acute and chronic respiratory failure with hypoxia: Secondary | ICD-10-CM | POA: Diagnosis present

## 2019-12-28 DIAGNOSIS — J9622 Acute and chronic respiratory failure with hypercapnia: Secondary | ICD-10-CM | POA: Diagnosis present

## 2019-12-28 DIAGNOSIS — G8929 Other chronic pain: Secondary | ICD-10-CM | POA: Diagnosis present

## 2019-12-28 DIAGNOSIS — Y95 Nosocomial condition: Secondary | ICD-10-CM | POA: Diagnosis not present

## 2019-12-28 DIAGNOSIS — Z9911 Dependence on respirator [ventilator] status: Secondary | ICD-10-CM | POA: Diagnosis not present

## 2019-12-28 DIAGNOSIS — Z930 Tracheostomy status: Secondary | ICD-10-CM | POA: Diagnosis not present

## 2019-12-28 DIAGNOSIS — Z86711 Personal history of pulmonary embolism: Secondary | ICD-10-CM | POA: Diagnosis not present

## 2019-12-28 DIAGNOSIS — J189 Pneumonia, unspecified organism: Secondary | ICD-10-CM | POA: Diagnosis not present

## 2019-12-28 DIAGNOSIS — E119 Type 2 diabetes mellitus without complications: Secondary | ICD-10-CM | POA: Diagnosis present

## 2019-12-28 DIAGNOSIS — I110 Hypertensive heart disease with heart failure: Secondary | ICD-10-CM | POA: Diagnosis present

## 2019-12-28 DIAGNOSIS — E873 Alkalosis: Secondary | ICD-10-CM | POA: Diagnosis present

## 2019-12-28 DIAGNOSIS — Z79891 Long term (current) use of opiate analgesic: Secondary | ICD-10-CM | POA: Diagnosis not present

## 2019-12-28 DIAGNOSIS — F319 Bipolar disorder, unspecified: Secondary | ICD-10-CM | POA: Diagnosis present

## 2019-12-28 DIAGNOSIS — J9690 Respiratory failure, unspecified, unspecified whether with hypoxia or hypercapnia: Secondary | ICD-10-CM | POA: Diagnosis present

## 2019-12-28 DIAGNOSIS — Z6841 Body Mass Index (BMI) 40.0 and over, adult: Secondary | ICD-10-CM | POA: Diagnosis not present

## 2019-12-28 DIAGNOSIS — J432 Centrilobular emphysema: Secondary | ICD-10-CM | POA: Diagnosis present

## 2019-12-28 DIAGNOSIS — Z20822 Contact with and (suspected) exposure to covid-19: Secondary | ICD-10-CM | POA: Diagnosis present

## 2019-12-28 DIAGNOSIS — Z87442 Personal history of urinary calculi: Secondary | ICD-10-CM | POA: Diagnosis not present

## 2019-12-28 DIAGNOSIS — J438 Other emphysema: Secondary | ICD-10-CM | POA: Diagnosis present

## 2019-12-28 DIAGNOSIS — D649 Anemia, unspecified: Secondary | ICD-10-CM | POA: Diagnosis present

## 2019-12-28 DIAGNOSIS — I5032 Chronic diastolic (congestive) heart failure: Secondary | ICD-10-CM | POA: Diagnosis present

## 2019-12-28 DIAGNOSIS — J9612 Chronic respiratory failure with hypercapnia: Secondary | ICD-10-CM | POA: Diagnosis not present

## 2019-12-28 DIAGNOSIS — Z7901 Long term (current) use of anticoagulants: Secondary | ICD-10-CM | POA: Diagnosis not present

## 2019-12-28 DIAGNOSIS — J9611 Chronic respiratory failure with hypoxia: Secondary | ICD-10-CM | POA: Diagnosis not present

## 2019-12-28 DIAGNOSIS — Z79899 Other long term (current) drug therapy: Secondary | ICD-10-CM | POA: Diagnosis not present

## 2019-12-28 DIAGNOSIS — J156 Pneumonia due to other aerobic Gram-negative bacteria: Secondary | ICD-10-CM | POA: Diagnosis present

## 2019-12-28 LAB — BASIC METABOLIC PANEL
Anion gap: 10 (ref 5–15)
BUN: 13 mg/dL (ref 6–20)
CO2: 42 mmol/L — ABNORMAL HIGH (ref 22–32)
Calcium: 9.1 mg/dL (ref 8.9–10.3)
Chloride: 88 mmol/L — ABNORMAL LOW (ref 98–111)
Creatinine, Ser: 0.63 mg/dL (ref 0.44–1.00)
GFR calc Af Amer: >60 mL/min (ref >60)
GFR calc non Af Amer: >60 mL/min (ref >60)
Glucose, Bld: 142 mg/dL — ABNORMAL HIGH (ref 70–99)
Potassium: 3.2 mmol/L — ABNORMAL LOW (ref 3.5–5.1)
Sodium: 140 mmol/L (ref 135–145)

## 2019-12-28 LAB — RESPIRATORY PANEL BY PCR

## 2019-12-28 LAB — POCT I-STAT 7, (LYTES, BLD GAS, ICA,H+H)
Acid-Base Excess: 21 mmol/L — ABNORMAL HIGH (ref 0.0–2.0)
Bicarbonate: 47.4 mmol/L — ABNORMAL HIGH (ref 20.0–28.0)
Calcium, Ion: 1.19 mmol/L (ref 1.15–1.40)
HCT: 31 % — ABNORMAL LOW (ref 36.0–46.0)
Hemoglobin: 10.5 g/dL — ABNORMAL LOW (ref 12.0–15.0)
O2 Saturation: 87 %
Patient temperature: 98.9
Potassium: 3.3 mmol/L — ABNORMAL LOW (ref 3.5–5.1)
Sodium: 139 mmol/L (ref 135–145)
TCO2: 49 mmol/L — ABNORMAL HIGH (ref 22–32)
pCO2 arterial: 61.7 mmHg — ABNORMAL HIGH (ref 32.0–48.0)
pH, Arterial: 7.495 — ABNORMAL HIGH (ref 7.350–7.450)
pO2, Arterial: 51 mmHg — ABNORMAL LOW (ref 83.0–108.0)

## 2019-12-28 LAB — GLUCOSE, CAPILLARY
Glucose-Capillary: 130 mg/dL — ABNORMAL HIGH (ref 70–99)
Glucose-Capillary: 133 mg/dL — ABNORMAL HIGH (ref 70–99)
Glucose-Capillary: 147 mg/dL — ABNORMAL HIGH (ref 70–99)
Glucose-Capillary: 152 mg/dL — ABNORMAL HIGH (ref 70–99)
Glucose-Capillary: 160 mg/dL — ABNORMAL HIGH (ref 70–99)
Glucose-Capillary: 164 mg/dL — ABNORMAL HIGH (ref 70–99)

## 2019-12-28 LAB — CBC
HCT: 27.9 % — ABNORMAL LOW (ref 36.0–46.0)
Hemoglobin: 8.2 g/dL — ABNORMAL LOW (ref 12.0–15.0)
MCH: 27.7 pg (ref 26.0–34.0)
MCHC: 29.4 g/dL — ABNORMAL LOW (ref 30.0–36.0)
MCV: 94.3 fL (ref 80.0–100.0)
Platelets: 164 10*3/uL (ref 150–400)
RBC: 2.96 MIL/uL — ABNORMAL LOW (ref 3.87–5.11)
RDW: 16.4 % — ABNORMAL HIGH (ref 11.5–15.5)
WBC: 7.6 10*3/uL (ref 4.0–10.5)
nRBC: 0 % (ref 0.0–0.2)

## 2019-12-28 LAB — MAGNESIUM
Magnesium: 1.5 mg/dL — ABNORMAL LOW (ref 1.7–2.4)
Magnesium: 2.5 mg/dL — ABNORMAL HIGH (ref 1.7–2.4)

## 2019-12-28 LAB — RESPIRATORY PANEL BY RT PCR (FLU A&B, COVID)
Influenza A by PCR: NEGATIVE
Influenza B by PCR: NEGATIVE
SARS Coronavirus 2 by RT PCR: NEGATIVE

## 2019-12-28 LAB — HIV ANTIBODY (ROUTINE TESTING W REFLEX): HIV Screen 4th Generation wRfx: NONREACTIVE

## 2019-12-28 LAB — PHOSPHORUS: Phosphorus: 1.3 mg/dL — ABNORMAL LOW (ref 2.5–4.6)

## 2019-12-28 LAB — MRSA PCR SCREENING: MRSA by PCR: NEGATIVE

## 2019-12-28 MED ORDER — SODIUM CHLORIDE 0.9 % IV SOLN
2.0000 g | Freq: Three times a day (TID) | INTRAVENOUS | Status: DC
  Administered 2019-12-28 – 2019-12-29 (×4): 2 g via INTRAVENOUS
  Filled 2019-12-28 (×4): qty 2

## 2019-12-28 MED ORDER — SALINE SPRAY 0.65 % NA SOLN
2.0000 | Freq: Every day | NASAL | Status: DC | PRN
  Filled 2019-12-28: qty 44

## 2019-12-28 MED ORDER — DOCUSATE SODIUM 100 MG PO CAPS: 100.0000 mg | ORAL_CAPSULE | Freq: Two times a day (BID) | ORAL | Status: DC | PRN

## 2019-12-28 MED ORDER — RIVAROXABAN 20 MG PO TABS
20.0000 mg | ORAL_TABLET | Freq: Every day | ORAL | Status: DC
  Administered 2019-12-28: 20 mg via ORAL
  Filled 2019-12-28 (×2): qty 1

## 2019-12-28 MED ORDER — TRAZODONE HCL 50 MG PO TABS
150.0000 mg | ORAL_TABLET | Freq: Every day | ORAL | Status: DC
  Administered 2019-12-28: 150 mg via ORAL
  Filled 2019-12-28: qty 3

## 2019-12-28 MED ORDER — FLUTICASONE PROPIONATE 50 MCG/ACT NA SUSP
2.0000 | Freq: Every day | NASAL | Status: DC
  Administered 2019-12-28: 2 via NASAL
  Filled 2019-12-28: qty 16

## 2019-12-28 MED ORDER — SODIUM CHLORIDE 0.9% FLUSH
10.0000 mL | Freq: Two times a day (BID) | INTRAVENOUS | Status: DC
  Administered 2019-12-28: 20 mL
  Administered 2019-12-28 – 2019-12-29 (×3): 10 mL

## 2019-12-28 MED ORDER — FAMOTIDINE 20 MG PO TABS
20.0000 mg | ORAL_TABLET | Freq: Every day | ORAL | Status: DC
  Administered 2019-12-28 – 2019-12-29 (×2): 20 mg via ORAL
  Filled 2019-12-28 (×2): qty 1

## 2019-12-28 MED ORDER — SODIUM CHLORIDE 0.9 % IV SOLN
2.0000 g | Freq: Once | INTRAVENOUS | Status: AC
  Administered 2019-12-28: 2 g via INTRAVENOUS
  Filled 2019-12-28: qty 2

## 2019-12-28 MED ORDER — POLYETHYLENE GLYCOL 3350 17 G PO PACK: 17.0000 g | PACK | Freq: Every day | ORAL | Status: DC | PRN

## 2019-12-28 MED ORDER — CHLORHEXIDINE GLUCONATE CLOTH 2 % EX PADS
6.0000 | MEDICATED_PAD | Freq: Every day | CUTANEOUS | Status: DC
  Administered 2019-12-28 – 2019-12-29 (×3): 6 via TOPICAL

## 2019-12-28 MED ORDER — ALBUTEROL SULFATE (2.5 MG/3ML) 0.083% IN NEBU: 2.5000 mg | INHALATION_SOLUTION | RESPIRATORY_TRACT | Status: DC | PRN

## 2019-12-28 MED ORDER — VENLAFAXINE HCL ER 75 MG PO CP24
225.0000 mg | ORAL_CAPSULE | Freq: Every day | ORAL | Status: DC
  Administered 2019-12-28 – 2019-12-29 (×2): 225 mg via ORAL
  Filled 2019-12-28 (×2): qty 1

## 2019-12-28 MED ORDER — CHLORHEXIDINE GLUCONATE 0.12% ORAL RINSE (MEDLINE KIT)
15.0000 mL | Freq: Two times a day (BID) | OROMUCOSAL | Status: DC
  Administered 2019-12-28 (×2): 15 mL via OROMUCOSAL

## 2019-12-28 MED ORDER — RIVAROXABAN 20 MG PO TABS: 20.0000 mg | ORAL_TABLET | Freq: Every day | ORAL | Status: DC

## 2019-12-28 MED ORDER — SODIUM CHLORIDE 0.9 % IV SOLN
500.0000 mg | Freq: Every day | INTRAVENOUS | Status: DC
  Administered 2019-12-28 (×2): 500 mg via INTRAVENOUS
  Filled 2019-12-28 (×3): qty 500

## 2019-12-28 MED ORDER — ORAL CARE MOUTH RINSE
15.0000 mL | OROMUCOSAL | Status: DC
  Administered 2019-12-28 – 2019-12-29 (×9): 15 mL via OROMUCOSAL

## 2019-12-28 MED ORDER — OLOPATADINE HCL 0.1 % OP SOLN
1.0000 [drp] | Freq: Two times a day (BID) | OPHTHALMIC | Status: DC
  Administered 2019-12-28 – 2019-12-29 (×3): 1 [drp] via OPHTHALMIC
  Filled 2019-12-28: qty 5

## 2019-12-28 MED ORDER — METOPROLOL TARTRATE 12.5 MG HALF TABLET
50.0000 mg | ORAL_TABLET | Freq: Two times a day (BID) | ORAL | Status: DC
  Administered 2019-12-28 – 2019-12-29 (×3): 50 mg via ORAL
  Filled 2019-12-28 (×3): qty 4

## 2019-12-28 MED ORDER — IPRATROPIUM-ALBUTEROL 0.5-2.5 (3) MG/3ML IN SOLN
3.0000 mL | Freq: Four times a day (QID) | RESPIRATORY_TRACT | Status: DC
  Administered 2019-12-28 – 2019-12-29 (×5): 3 mL via RESPIRATORY_TRACT
  Filled 2019-12-28 (×5): qty 3

## 2019-12-28 MED ORDER — PREDNISONE 10 MG PO TABS
40.0000 mg | ORAL_TABLET | Freq: Every day | ORAL | Status: DC
  Administered 2019-12-28 – 2019-12-29 (×2): 40 mg via ORAL
  Filled 2019-12-28 (×2): qty 4

## 2019-12-28 MED ORDER — LORATADINE 10 MG PO TABS
10.0000 mg | ORAL_TABLET | Freq: Every day | ORAL | Status: DC
  Administered 2019-12-28 – 2019-12-29 (×2): 10 mg via ORAL
  Filled 2019-12-28 (×2): qty 1

## 2019-12-28 MED ORDER — SODIUM CHLORIDE 0.9 % IV SOLN
6.0000 g | Freq: Once | INTRAVENOUS | Status: AC
  Administered 2019-12-28: 6 g via INTRAVENOUS
  Filled 2019-12-28: qty 12

## 2019-12-28 MED ORDER — HYDROCODONE-ACETAMINOPHEN 5-325 MG PO TABS
1.0000 | ORAL_TABLET | Freq: Four times a day (QID) | ORAL | Status: DC | PRN
  Administered 2019-12-28 – 2019-12-29 (×3): 1 via ORAL
  Filled 2019-12-28 (×3): qty 1

## 2019-12-28 MED ORDER — SODIUM CHLORIDE 0.9% FLUSH: 10.0000 mL | INTRAVENOUS | Status: DC | PRN

## 2019-12-28 MED ORDER — TORSEMIDE 20 MG PO TABS
20.0000 mg | ORAL_TABLET | Freq: Every day | ORAL | Status: DC
  Administered 2019-12-28 – 2019-12-29 (×2): 20 mg via ORAL
  Filled 2019-12-28 (×2): qty 1

## 2019-12-28 MED ORDER — POTASSIUM PHOSPHATES 15 MMOLE/5ML IV SOLN
30.0000 mmol | Freq: Once | INTRAVENOUS | Status: AC
  Administered 2019-12-28: 30 mmol via INTRAVENOUS
  Filled 2019-12-28: qty 10

## 2019-12-28 MED ORDER — DIVALPROEX SODIUM 500 MG PO DR TAB
1000.0000 mg | DELAYED_RELEASE_TABLET | Freq: Two times a day (BID) | ORAL | Status: DC
  Administered 2019-12-28 – 2019-12-29 (×3): 1000 mg via ORAL
  Filled 2019-12-28 (×4): qty 2

## 2019-12-28 MED ORDER — PANTOPRAZOLE SODIUM 40 MG IV SOLR
40.0000 mg | Freq: Every day | INTRAVENOUS | Status: DC
  Administered 2019-12-28 (×2): 40 mg via INTRAVENOUS
  Filled 2019-12-28 (×2): qty 40

## 2019-12-28 MED ORDER — ONDANSETRON 4 MG PO TBDP: 4.0000 mg | ORAL_TABLET | Freq: Four times a day (QID) | ORAL | Status: DC | PRN

## 2019-12-28 MED ORDER — POLYVINYL ALCOHOL 1.4 % OP SOLN
1.0000 [drp] | Freq: Four times a day (QID) | OPHTHALMIC | Status: DC
  Administered 2019-12-28 – 2019-12-29 (×5): 1 [drp] via OPHTHALMIC
  Filled 2019-12-28: qty 15

## 2019-12-28 MED ORDER — CHLORHEXIDINE GLUCONATE CLOTH 2 % EX PADS: 6.0000 | MEDICATED_PAD | Freq: Every day | CUTANEOUS | Status: DC

## 2019-12-28 NOTE — Evaluation (Signed)
[facility] Speaking Valve - Evaluation Patient Details  Name: Kim Jenkins MRN: [id number] Date of Birth: 1966-02-12  Today's Date: 12/28/2019 Time: 1158-1207 SLP Time Calculation (min) (ACUTE ONLY): 9 min  Past Medical History:  Past Medical History:  Diagnosis Date  . Bipolar 1 disorder ([place])   . Chronic pain   . Depression   . Diabetes mellitus ([place])   . Hypertension   . Kidney stones   . Morbid obesity ([place])   . Obesity hypoventilation syndrome ([place])   . Obstructive sleep apnea   . Panniculitis    Past Surgical History:  Past Surgical History:  Procedure Laterality Date  . ESOPHAGOGASTRODUODENOSCOPY N/A 05/06/2018   Procedure: ESOPHAGOGASTRODUODENOSCOPY (EGD);  Surgeon: Irving Copas., MD;  Location: [facility];  Service: Gastroenterology;  Laterality: N/A;  . RIGHT OOPHORECTOMY    . TRACHEOSTOMY    . VENTRAL HERNIA REPAIR     HPI:  54 y.o. F with PMH of OSA/OHS with chronic respiratory s/p tracheostomy and nighttime vent dependence and recent PE on Xarelto and pneumonia who was brought in for hypoxia and requiring ventilator support throughout the day. This admission pt reported she was more somnolent and hypoxic during the day and sent to ED. Found to have desaturated into the 80%'s when taken off the ventilator. Work-up significant for persistent opacity in the right base, no fever, leukocytosis or significant metabolic derangement. Per chart she complains of worsening dyspnea and central reproducible chest pain,   Assessment / Plan / Recommendation Clinical Impression  Pt is familiar to this therapist during admission last month where she was evaluated for swallow ability wearing her personal PMV from [facility]. She requires chronic nocturnal ventilation at [facility] and her PMV is not present this admission. Respirations at rest were clear, no audible secretions and pt was observed with valve for approximately 20 minutes. Quality of voice was clear, normal intensity  and respiratory/phonatory coordination adequate. RR, HR and SpO2 were in normal range. She was unable to donn valve using mirror and verbal cues with therapist. Pt would benefit from a session to safely coordinate donning and doffing valve- states she doffs prior to sleeping.      SLP Visit Diagnosis: Aphonia (R49.1)    SLP Assessment  Patient needs continued Speech Lanaguage Pathology Services    Follow Up Recommendations  None    Frequency and Duration min 1 x/week  1 week    PMSV Trial PMSV was placed for: 20 min Able to redirect subglottic air through upper airway: Yes Able to Attain Phonation: Yes Voice Quality: Normal Able to Expectorate Secretions: No attempts Breath Support for Phonation: Adequate Intelligibility: Intelligible Respirations During Trial: 20 SpO2 During Trial: 98 % Pulse During Trial: 82 Behavior: Alert;Controlled;Cooperative;Expresses self well;Good eye contact;Responsive to questions   Tracheostomy Tube       Vent Dependency  FiO2 (%): 35 %    Cuff Deflation Trial  GO Tolerated Cuff Deflation: (deflated at baseline) Behavior: Alert;Controlled;Cooperative;Expresses self well;Good eye contact;Responsive to questions;Smiling        Houston Siren 12/28/2019, 1:56 PM   [name].Ed Risk analyst [phone] Office [phone]

## 2019-12-28 NOTE — Progress Notes (Signed)
[facility] Progress Note Patient Name: Kim Jenkins DOB: 11-14-1965 MRN: [id number]   Date of Service  12/28/2019  HPI/Events of Note  Pt with chronic respiratory failure s/p tracheostomy, she normally requires mechanical ventilation only at night but lately has not been able to separate from the ventilator without desaturation.  eICU Interventions  Pt admitted for work up of her altered respiratory status. New Patient Evaluation completed.        Kerry Kass Jacqueli Pangallo 12/28/2019, 2:13 AM

## 2019-12-28 NOTE — Consult Note (Signed)
Requested follow up chest xray to verify PICC placement. Provider aware.

## 2019-12-28 NOTE — H&P (Signed)
NAME:  Kim Jenkins, MRN:  [id number], DOB:  1966/07/04, LOS: 0 ADMISSION DATE:  12/27/2019, CONSULTATION DATE:  12/28/19 REFERRING MD:  EDP, CHIEF COMPLAINT:  hypoxia  Brief History   54 y.o. F with PMH of OSA/OHS with chronic respiratory s/p tracheostomy and nighttime vent dependence and recent PE on Xarelto and pneumonia who was brought in for hypoxia and requiring ventilator support throughout the day.  History of present illness   Kim Jenkins is a 54 year old female 54 y.o. F with PMH of OSA/OHS with chronic respiratory s/p tracheostomy and nighttime vent dependence, PE on Xarelto, bipolar 1, depression, diabetes, hypertension who resides at [facility].  She was admitted last month with acute PE and Proteus Mirabilis pneumonia and treated with ceftriaxone and started on Xarelto.  Per report, she was more somnolent and hypoxic during the day today so was sent in for evaluation.  The emergency department, patient desaturated into the 80%'s when taken off the ventilator.  Work-up significant for persistent opacity in the right base, no fever, leukocytosis or significant metabolic derangements.  On admission she is awake and alert, she denies any fevers, coughing, or worsening lower extremity edema.  She complains of worsening dyspnea and central reproducible chest pain, EKG normal sinus rhythm and troponins negative.  PCCM consulted for admission  Past Medical History   has a past medical history of Bipolar 1 disorder ([place]), Chronic pain, Depression, Diabetes mellitus ([employer]), Hypertension, Kidney stones, Morbid obesity ([place]), Obesity hypoventilation syndrome ([place]), Obstructive sleep apnea, and Panniculitis.   Significant Hospital Events   12/28/19 transfer from [place] and admit to PCCM  Consults:     Procedures:    Significant Diagnostic Tests:  12/28/19 CXR>>Persistent opacity at the right base may reflect atelectasis or pneumonia  Micro Data:  12/28/19 Covid-19 and flu>>negative 12/28/19  RVP   Antimicrobials:  Cefepime 4/11- Azithromycin 4/11-  Interim history/subjective:  Patient awake and alert,  is able to mouth answers to questions  Objective   Blood pressure 125/67, pulse 94, temperature 98.9 F (37.2 C), temperature source Oral, resp. rate 13, SpO2 96 %.    Vent Mode: PRVC FiO2 (%):  [40 %] 40 % Set Rate:  [12 bmp-14 bmp] 14 bmp Vt Set:  [500 mL] 500 mL PEEP:  [8 cmH20] 8 cmH20 Plateau Pressure:  [19 cmH20-21 cmH20] 21 cmH20  No intake or output data in the 24 hours ending 12/28/19 0100 There were no vitals filed for this visit.  General: Obese female, no acute distress HEENT: MM pink/moist, trach in place Neuro: Awake and alert, able to mouth answers oriented to person and situation and following commands CV: s1s2 RRR, no m/r/g PULM: Rhonchi right lower lobe, faint expiratory wheezing bilaterally GI: soft, bsx4 active  Extremities: warm/dry, 2+ edema  Skin: no rashes or lesions   Resolved Hospital Problem list     Assessment & Plan:   Acute on chronic hypoxic respiratory failure likely secondary to HCAP versus COPD exacerbation -Either recurrent or residual right lower lobe infiltrate on chest x-ray -Low suspicion for PE as patient is anticoagulated on Xarelto P: -Admit with full ventilator support -Treat with cefepime, will hold vancomycin given the last MRSA screen was negative and she is afebrile without leukocytosis and fairly well-appearing -Check sputum culture and respiratory viral panel, Covid-19 is negative -Emphysema on last CT scan and patient is a former smoker, start prednisone and azithromycin along with scheduled duo nebs --Maintain full vent support with SBT as tolerated -titrate Vent setting to maintain  SpO2 greater than or equal to 90%. -HOB elevated 30 degrees. -Plateau pressures less than 30 cm H20.  -Follow chest x-ray, ABG prn.   -Bronchial hygiene and RT/bronchodilator protocol.  History of PE -Continue  Xarelto  HFpEF -Diastolic heart failure on last echo 3/21 with EF of 60 to 65% -Continue torsemide  Hypertension -Continue home metoprolol   Diabetes -Sliding scale insulin, hold Lantus for now as she is n.p.o. until bedside swallow can be completed, she has a history of aspiration   Bipolar disorder/depression -Continue Depakote and Abilify  Chronic pain -Continue Norco as prescribed    Best practice:  Diet: Takes p.o.'s at [place], n.p.o. and perform bedside swallow Pain/Anxiety/Delirium protocol (if indicated):  VAP protocol (if indicated): HOB 30 degrees, daily SBT DVT prophylaxis:  GI prophylaxis: Pepcid Glucose control: SSI Mobility: Bedrest Code Status: Full code Family Communication: Patient did not have any family she wanted to be updated Disposition: ICU  Labs   CBC: Recent Labs  Lab 12/27/19 2030 12/27/19 2122  WBC 7.8  --   NEUTROABS 4.9  --   HGB 8.9* 10.5*  HCT 30.3* 31.0*  MCV 94.4  --   PLT 185  --     Basic Metabolic Panel: Recent Labs  Lab 12/27/19 2030 12/27/19 2122  NA 138 138  K 3.8 3.8  CL 87*  --   CO2 43*  --   GLUCOSE 138*  --   BUN 11  --   CREATININE 0.59  --   CALCIUM 9.1  --    GFR: CrCl cannot be calculated (Unknown ideal weight.). Recent Labs  Lab 12/27/19 2030  WBC 7.8    Liver Function Tests: Recent Labs  Lab 12/27/19 2030  AST 17  ALT 11  ALKPHOS 40  BILITOT 0.4  PROT 7.2  ALBUMIN 2.7*   No results for input(s): LIPASE, AMYLASE in the last 168 hours. No results for input(s): AMMONIA in the last 168 hours.  ABG    Component Value Date/Time   PHART 7.507 (H) 12/27/2019 2122   PCO2ART 60.9 (H) 12/27/2019 2122   PO2ART 75.0 (L) 12/27/2019 2122   HCO3 48.2 (H) 12/27/2019 2122   TCO2 >50 (H) 12/27/2019 2122   O2SAT 95.0 12/27/2019 2122     Coagulation Profile: No results for input(s): INR, PROTIME in the last 168 hours.  Cardiac Enzymes: No results for input(s): CKTOTAL, CKMB, CKMBINDEX,  TROPONINI in the last 168 hours.  HbA1C: Hgb A1c MFr Bld  Date/Time Value Ref Range Status  12/05/2019 06:15 AM 5.2 4.8 - 5.6 % Final    Comment:    (NOTE) Pre diabetes:          5.7%-6.4% Diabetes:              >6.4% Glycemic control for   <7.0% adults with diabetes     CBG: No results for input(s): GLUCAP in the last 168 hours.  Review of Systems:   Negative except as noted in HPI  Past Medical History  She,  has a past medical history of Bipolar 1 disorder ([place]), Chronic pain, Depression, Diabetes mellitus ([place]), Hypertension, Kidney stones, Morbid obesity ([place]), Obesity hypoventilation syndrome ([place]), Obstructive sleep apnea, and Panniculitis.   Surgical History    Past Surgical History:  Procedure Laterality Date  . ESOPHAGOGASTRODUODENOSCOPY N/A 05/06/2018   Procedure: ESOPHAGOGASTRODUODENOSCOPY (EGD);  Surgeon: Irving Copas., MD;  Location: [place];  Service: Gastroenterology;  Laterality: N/A;  . RIGHT OOPHORECTOMY    . TRACHEOSTOMY    .  VENTRAL HERNIA REPAIR       Social History   reports that she has never smoked. She has never used smokeless tobacco. She reports that she does not drink alcohol or use drugs.   Family History   Her family history is not on file.   Allergies Allergies  Allergen Reactions  . Meperidine And Related Shortness Of Breath and Other (See Comments)    "Allergic," per MAR  . Cefazolin Other (See Comments)    "Allergic," per [facility]     Home Medications  Prior to Admission medications   Medication Sig Start Date End Date Taking? Authorizing Provider  ARIPiprazole (ABILIFY) 5 MG tablet Take 1 tablet (5 mg total) by mouth daily. 09/13/18  Yes Ward, Ozella Almond, PA-C  Cholecalciferol (VITAMIN D-3) 25 MCG (1000 UT) CAPS Take 1,000 Units by mouth daily.   Yes [provider]  dextromethorphan-guaiFENesin (ROBITUSSIN-DM) 10-100 MG/5ML liquid Take 5 mLs by mouth every 6 (six) hours.    Yes [provider]   diazepam (VALIUM) 5 MG tablet Take 5 mg by mouth every 12 (twelve) hours.    Yes [provider]  divalproex (DEPAKOTE) 500 MG DR tablet Take 1,000 mg by mouth every 12 (twelve) hours.   Yes [provider]  ergocalciferol (VITAMIN D2) 50000 units capsule Take 50,000 Units by mouth every Tuesday.    Yes [provider]  Exenatide ER (BYDUREON) 2 MG PEN Inject 2 mg into the skin every Thursday.   Yes [provider]  famotidine (PEPCID) 20 MG tablet Take 20 mg by mouth daily.    Yes [provider]  fluticasone (FLONASE) 50 MCG/ACT nasal spray Place 2 sprays into both nostrils at bedtime.    Yes [provider]  HYDROcodone-acetaminophen (NORCO/VICODIN) 5-325 MG tablet Take 1 tablet by mouth every 6 (six) hours as needed for moderate pain. Patient taking differently: Take 2 tablets by mouth every 6 (six) hours as needed (for pain).  05/12/19  Yes Carmin Muskrat, MD  insulin glargine (LANTUS SOLOSTAR) 100 UNIT/ML Solostar Pen Inject 20 Units into the skin daily.   Yes [provider]  ipratropium-albuterol (DUONEB) 0.5-2.5 (3) MG/3ML SOLN Inhale 3 mLs into the lungs 4 (four) times daily as needed (for dyspnea).  10/20/19  Yes [provider]  loperamide (IMODIUM A-D) 2 MG tablet Take 2 mg by mouth every 6 (six) hours as needed for diarrhea or loose stools.    Yes [provider]  loratadine (CLARITIN) 10 MG tablet Take 10 mg by mouth daily.    Yes [provider]  metoprolol tartrate (LOPRESSOR) 50 MG tablet Take 50 mg by mouth every 12 (twelve) hours.    Yes [provider]  Multiple Vitamin (MULTIVITAMIN WITH MINERALS) TABS tablet Take 1 tablet by mouth daily.   Yes [provider]  nitroGLYCERIN (NITROSTAT) 0.4 MG SL tablet Place 0.4 mg under the tongue every 5 (five) minutes x 3 doses as needed for chest pain (AND CALL PROVIDER).    Yes [provider]  olopatadine (PATANOL) 0.1 %  ophthalmic solution Place 1 drop into both eyes 2 (two) times daily.   Yes [provider]  ondansetron (ZOFRAN-ODT) 4 MG disintegrating tablet Take 4 mg by mouth every 6 (six) hours as needed for nausea or vomiting (DISSOLVE ORALLY).   Yes [provider]  pantoprazole (PROTONIX) 40 MG tablet Take 40 mg by mouth daily.    Yes [provider]  Propylene Glycol (SYSTANE BALANCE) 0.6 %  SOLN Place 1 drop into both eyes every 6 (six) hours.    Yes [provider]  Rivaroxaban (XARELTO) 15 MG TABS tablet Take 1 tablet (15 mg total) by mouth 2 (two) times daily. Patient taking differently: Take 15 mg by mouth See admin instructions. Take 15 mg by mouth two times a day for 18 days, starting on 12/09/2019 12/09/19  Yes Erick Colace, NP  sodium chloride (OCEAN) 0.65 % SOLN nasal spray Place 2 sprays into both nostrils daily as needed for congestion (or allergic rhinitis).    Yes [provider]  torsemide (DEMADEX) 20 MG tablet Take 20 mg by mouth daily.   Yes [provider]  traZODone (DESYREL) 150 MG tablet Take 1 tablet (150 mg total) by mouth at bedtime. 09/13/18  Yes Ward, Ozella Almond, PA-C  venlafaxine XR (EFFEXOR-XR) 75 MG 24 hr capsule Take 225 mg by mouth daily with breakfast.   Yes [provider]  divalproex (DEPAKOTE ER) 500 MG 24 hr tablet Take 2 tablets (1,000 mg total) by mouth every 12 (twelve) hours. Patient not taking: Reported on 12/28/2019 09/13/18   Ward, Ozella Almond, PA-C  rivaroxaban (XARELTO) 20 MG TABS tablet Take 1 tablet (20 mg total) by mouth daily. 12/28/19   Erick Colace, NP  sodium chloride flush (NS) 0.9 % SOLN 10-40 mLs by Intracatheter route as needed (flush). Patient not taking: Reported on 12/28/2019 12/09/19   Erick Colace, NP     Critical care time: 52 minutes    CRITICAL CARE Performed by: Otilio Carpen Jarry Manon   Total critical care time: 52 minutes  Critical care time was exclusive of  separately billable procedures and treating other patients.  Critical care was necessary to treat or prevent imminent or life-threatening deterioration.  Critical care was time spent personally by me on the following activities: development of treatment plan with patient and/or surrogate as well as nursing, discussions with consultants, evaluation of patient's response to treatment, examination of patient, obtaining history from patient or surrogate, ordering and performing treatments and interventions, ordering and review of laboratory studies, ordering and review of radiographic studies, pulse oximetry and re-evaluation of patient's condition.  Otilio Carpen [place], PA-C

## 2019-12-28 NOTE — Progress Notes (Signed)
Patient transported to 2M03 without complications.  ?

## 2019-12-28 NOTE — Progress Notes (Signed)
PCCM Brief note:  Please see H&P from this morning.   Patient from [facility]. Chronic vent. Recent proteus pneumonia discharged on 3/23. Bilateral subsegmetnal PE on xarelto. Admitted for acute on chronic hypoxemic, hypercarbic resp failure, OHS s/p trach. Still using vent at night   This morning she is conversant. Stable.  CXR with RLL pna. Unsure if this is incomplete resolution from previous  Being treated with abx and steroids  Continue AC  Remains on TCT She needs a PMV so she can talk  I think she could move back to [facility] soon.   Garner Nash, DO [facility] 12/28/2019 9:31 AM

## 2019-12-28 NOTE — Progress Notes (Signed)
Patient has pressure injury dark purple and blanchable with 2 open area size 1x1 on back side of thighs. Area  cleansed and foam dressing applied. Patient states that it has been there before this admission

## 2019-12-28 NOTE — Evaluation (Addendum)
Clinical/Bedside Swallow Evaluation Patient Details  Name: Kim Jenkins MRN: [id number] Date of Birth: 07-Aug-1966  Today's Date: 12/28/2019 Time: SLP Start Time (ACUTE ONLY): 1158 SLP Stop Time (ACUTE ONLY): 1207 SLP Time Calculation (min) (ACUTE ONLY): 9 min  Past Medical History:  Past Medical History:  Diagnosis Date  . Bipolar 1 disorder ([name])   . Chronic pain   . Depression   . Diabetes mellitus ([name])   . Hypertension   . Kidney stones   . Morbid obesity ([name])   . Obesity hypoventilation syndrome ([name])   . Obstructive sleep apnea   . Panniculitis    Past Surgical History:  Past Surgical History:  Procedure Laterality Date  . ESOPHAGOGASTRODUODENOSCOPY N/A 05/06/2018   Procedure: ESOPHAGOGASTRODUODENOSCOPY (EGD);  Surgeon: Irving Copas., MD;  Location: [facility];  Service: Gastroenterology;  Laterality: N/A;  . RIGHT OOPHORECTOMY    . TRACHEOSTOMY    . VENTRAL HERNIA REPAIR     HPI:  54 y.o. F with PMH of OSA/OHS with chronic respiratory s/p tracheostomy and nighttime vent dependence and recent PE on Xarelto and pneumonia who was brought in for hypoxia and requiring ventilator support throughout the day. This admission pt reported she was more somnolent and hypoxic during the day and sent to ED. Found to have desaturated into the 80%'s when taken off the ventilator. Work-up significant for persistent opacity in the right base. Per chart she complains of worsening dyspnea and central reproducible chest pain. BSE 12/06/19 without overt s/s aspiration and regular texture, thin liquids recommended.    Assessment / Plan / Recommendation Clinical Impression  Previous admission 11/2019, swallow ability evaluated and deemed effective/safe for regular texture, thin liquids. She is now admitted with another pneumonia (RLL). There is concern today for airway intrusion as demonstrated by immediate throat clearing and delayed cough with sips water with fair consistency. Instrumental  testing may be appropriate given clinical findings and repeated pneumonias and recommend modifications if needed. Recommend she continue regular/thin liquids, donn PMV with meals, pills with water and ST follow up to further determine appropriateness for FEES.   SLP Visit Diagnosis: Dysphagia, unspecified (R13.10)    Aspiration Risk  Mild aspiration risk;Moderate aspiration risk    Diet Recommendation Regular;Thin liquid   Liquid Administration via: Cup;Straw Medication Administration: Whole meds with liquid Supervision: Patient able to self feed Compensations: Slow rate;Small sips/bites(PMV donned with meals) Postural Changes: Seated upright at 90 degrees;Remain upright for at least 30 minutes after po intake    Other  Recommendations Oral Care Recommendations: Oral care BID   Follow up Recommendations Other (comment)(TBD)      Frequency and Duration min 2x/week  2 weeks       Prognosis Prognosis for Safe Diet Advancement: Good      Swallow Study   General HPI: 54 y.o. F with PMH of OSA/OHS with chronic respiratory s/p tracheostomy and nighttime vent dependence and recent PE on Xarelto and pneumonia who was brought in for hypoxia and requiring ventilator support throughout the day. This admission pt reported she was more somnolent and hypoxic during the day and sent to ED. Found to have desaturated into the 80%'s when taken off the ventilator. Work-up significant for persistent opacity in the right base. Per chart she complains of worsening dyspnea and central reproducible chest pain. BSE 12/06/19 without overt s/s aspiration and regular texture, thin liquids recommended.  Type of Study: Bedside Swallow Evaluation Previous Swallow Assessment: see HPI Diet Prior to this Study: Regular;Thin liquids Temperature Spikes  Noted: No Respiratory Status: Trach;Trach Collar Trach Size and Type: Cuff;#6;Deflated;With PMSV in place History of Recent Intubation: No Behavior/Cognition:  Alert;Cooperative;Pleasant mood Oral Cavity Assessment: Within Functional Limits Oral Care Completed by SLP: No Oral Cavity - Dentition: (natural lower dentition (partial / 0, missing upper) Vision: Functional for self-feeding Self-Feeding Abilities: Able to feed self Patient Positioning: Upright in bed Baseline Vocal Quality: Normal Volitional Cough: Strong Volitional Swallow: Able to elicit    Oral/Motor/Sensory Function Overall Oral Motor/Sensory Function: Within functional limits   Ice Chips Ice chips: Not tested   Thin Liquid Thin Liquid: Impaired Presentation: Straw;Cup Oral Phase Impairments: Other (comment)(WFL) Pharyngeal  Phase Impairments: Cough - Delayed;Throat Clearing - Delayed    Nectar Thick Nectar Thick Liquid: Not tested   Honey Thick Honey Thick Liquid: Not tested   Puree Puree: Not tested   Solid     Solid: Within functional limits      Houston Siren 12/28/2019,2:19 PM  [name].Ed Risk analyst [phone] Office [phone]

## 2019-12-28 NOTE — Progress Notes (Signed)
[facility] Progress Note Patient Name: Kim Jenkins DOB: 24-May-1966 MRN: [id number]   Date of Service  12/28/2019  HPI/Events of Note  K+ 3.2, PHOS 1.5  eICU Interventions  K+PHOS 30 mmol iv x 1        Elson Ulbrich U Tomie Spizzirri 12/28/2019, 6:30 AM

## 2019-12-28 NOTE — Progress Notes (Signed)
[facility] ADULT ICU REPLACEMENT PROTOCOL FOR AM LAB REPLACEMENT ONLY  The patient does apply for the [facility] Adult ICU Electrolyte Replacment Protocol based on the criteria listed below:   1. Is GFR >/= 40 ml/min? Yes.    Patient's GFR today is >60 2. Is urine output >/= 0.5 ml/kg/hr for the last 6 hours? Yes.   Patient's UOP is 0.8 ml/kg/hr 3. Is BUN < 60 mg/dL? Yes.    Patient's BUN today is 13 4. Abnormal electrolyte(s): mag 1.5, k 3.2, phos 1.3 5. Ordered repletion with: Mag per protocol 6. If a panic level lab has been reported, has the CCM MD in charge been notified? Yes.  .   Physician:  Asked Dr Lucile Shutters to replace K and Phos  Jarquavious Fentress A 12/28/2019 6:00 AM

## 2019-12-28 NOTE — Progress Notes (Signed)
Pharmacy Antibiotic Note  Kim Jenkins is a 54 y.o. female admitted on 12/27/2019 with SOB/PNA.  Pharmacy has been consulted for Cefepime dosing.  Plan: Cefepime 2 g IV q8h     Temp (24hrs), Avg:99.1 F (37.3 C), Min:98.9 F (37.2 C), Max:99.2 F (37.3 C)  Recent Labs  Lab 12/27/19 2030  WBC 7.8  CREATININE 0.59    CrCl cannot be calculated (Unknown ideal weight.).    Allergies  Allergen Reactions  . Meperidine And Related Shortness Of Breath and Other (See Comments)    "Allergic," per MAR  . Cefazolin Other (See Comments)    "Allergic," per [facility]      Nithya Meriweather, Bronson Curb 12/28/2019 1:47 AM

## 2019-12-29 DIAGNOSIS — J9611 Chronic respiratory failure with hypoxia: Secondary | ICD-10-CM

## 2019-12-29 DIAGNOSIS — Y95 Nosocomial condition: Secondary | ICD-10-CM

## 2019-12-29 DIAGNOSIS — Z930 Tracheostomy status: Secondary | ICD-10-CM

## 2019-12-29 DIAGNOSIS — J189 Pneumonia, unspecified organism: Secondary | ICD-10-CM

## 2019-12-29 DIAGNOSIS — J9612 Chronic respiratory failure with hypercapnia: Secondary | ICD-10-CM

## 2019-12-29 LAB — BASIC METABOLIC PANEL
Anion gap: 8 (ref 5–15)
BUN: 14 mg/dL (ref 6–20)
CO2: 40 mmol/L — ABNORMAL HIGH (ref 22–32)
Calcium: 8.9 mg/dL (ref 8.9–10.3)
Chloride: 88 mmol/L — ABNORMAL LOW (ref 98–111)
Creatinine, Ser: 0.66 mg/dL (ref 0.44–1.00)
GFR calc Af Amer: >60 mL/min (ref >60)
GFR calc non Af Amer: >60 mL/min (ref >60)
Glucose, Bld: 172 mg/dL — ABNORMAL HIGH (ref 70–99)
Potassium: 3.5 mmol/L (ref 3.5–5.1)
Sodium: 136 mmol/L (ref 135–145)

## 2019-12-29 LAB — GLUCOSE, CAPILLARY
Glucose-Capillary: 150 mg/dL — ABNORMAL HIGH (ref 70–99)
Glucose-Capillary: 180 mg/dL — ABNORMAL HIGH (ref 70–99)
Glucose-Capillary: 139 mg/dL — ABNORMAL HIGH (ref 70–99)

## 2019-12-29 LAB — PHOSPHORUS: Phosphorus: 3 mg/dL (ref 2.5–4.6)

## 2019-12-29 LAB — MAGNESIUM: Magnesium: 2.6 mg/dL — ABNORMAL HIGH (ref 1.7–2.4)

## 2019-12-29 MED ORDER — GERHARDT'S BUTT CREAM: 1.0000 "application " | TOPICAL_CREAM | CUTANEOUS | Status: DC | PRN

## 2019-12-29 MED ORDER — GERHARDT'S BUTT CREAM
TOPICAL_CREAM | CUTANEOUS | Status: DC | PRN
  Filled 2019-12-29: qty 1

## 2019-12-29 MED ORDER — IPRATROPIUM-ALBUTEROL 0.5-2.5 (3) MG/3ML IN SOLN
3.0000 mL | Freq: Three times a day (TID) | RESPIRATORY_TRACT | Status: DC
  Administered 2019-12-29: 3 mL via RESPIRATORY_TRACT
  Filled 2019-12-29: qty 3

## 2019-12-29 MED ORDER — SODIUM CHLORIDE 0.9 % IV SOLN: 2.0000 g | Freq: Three times a day (TID) | INTRAVENOUS | Status: AC

## 2019-12-29 NOTE — Progress Notes (Signed)
Patient discharged to [facility] via [place]. Pt denies question and/or concerns at this time. VSS upon d/c, Right upper arm PICC saline locked; site clean, dry, and intact.

## 2019-12-29 NOTE — Discharge Summary (Signed)
Name: Kim Jenkins MRN: [id number] DOB: 23-Jul-1966 54 y.o. PCP: Patient, No Pcp Per  Date of Admission: 12/27/2019  8:21 PM Date of Discharge: 12/29/19 Attending Physician: Jacalyn Lefevre, MD  Discharge Diagnosis: 1. Acute on chronic hypercapnic respiratory failure 2. Metabolic alkalosis 3. Persistent right lower lobe pneumonia  Discharge Medications: Allergies as of 12/29/2019      Reactions   Meperidine And Related Shortness Of Breath, Other (See Comments)   "Allergic," per [facility]   Cefazolin Other (See Comments)   "Allergic," per [facility]      Medication List    TAKE these medications   ARIPiprazole 5 MG tablet Commonly known as: ABILIFY Take 1 tablet (5 mg total) by mouth daily.   Bydureon 2 MG Pen Generic drug: Exenatide ER Inject 2 mg into the skin every Thursday.   ceFEPIme 2 g in sodium chloride 0.9 % 100 mL Inject 2 g into the vein every 8 (eight) hours for 4 days.   dextromethorphan-guaiFENesin 10-100 MG/5ML liquid Commonly known as: ROBITUSSIN-DM Take 5 mLs by mouth every 6 (six) hours.   diazepam 5 MG tablet Commonly known as: VALIUM Take 5 mg by mouth every 12 (twelve) hours.   divalproex 500 MG DR tablet Commonly known as: DEPAKOTE Take 1,000 mg by mouth every 12 (twelve) hours.   divalproex 500 MG 24 hr tablet Commonly known as: DEPAKOTE ER Take 2 tablets (1,000 mg total) by mouth every 12 (twelve) hours.   ergocalciferol 1.25 MG (50000 UT) capsule Commonly known as: VITAMIN D2 Take 50,000 Units by mouth every Tuesday.   famotidine 20 MG tablet Commonly known as: PEPCID Take 20 mg by mouth daily.   fluticasone 50 MCG/ACT nasal spray Commonly known as: FLONASE Place 2 sprays into both nostrils at bedtime.   Gerhardt's butt cream Crea Apply 1 application topically as needed for irritation.   HYDROcodone-acetaminophen 5-325 MG tablet Commonly known as: NORCO/VICODIN Take 1 tablet by mouth every 6 (six) hours as needed for moderate pain. What  changed:   how much to take  reasons to take this   ipratropium-albuterol 0.5-2.5 (3) MG/3ML Soln Commonly known as: DUONEB Inhale 3 mLs into the lungs 4 (four) times daily as needed (for dyspnea).   Lantus SoloStar 100 UNIT/ML Solostar Pen Generic drug: insulin glargine Inject 20 Units into the skin daily.   loperamide 2 MG tablet Commonly known as: IMODIUM A-D Take 2 mg by mouth every 6 (six) hours as needed for diarrhea or loose stools.   loratadine 10 MG tablet Commonly known as: CLARITIN Take 10 mg by mouth daily.   metoprolol tartrate 50 MG tablet Commonly known as: LOPRESSOR Take 50 mg by mouth every 12 (twelve) hours.   multivitamin with minerals Tabs tablet Take 1 tablet by mouth daily.   nitroGLYCERIN 0.4 MG SL tablet Commonly known as: NITROSTAT Place 0.4 mg under the tongue every 5 (five) minutes x 3 doses as needed for chest pain (AND CALL PROVIDER).   olopatadine 0.1 % ophthalmic solution Commonly known as: PATANOL Place 1 drop into both eyes 2 (two) times daily.   ondansetron 4 MG disintegrating tablet Commonly known as: ZOFRAN-ODT Take 4 mg by mouth every 6 (six) hours as needed for nausea or vomiting (DISSOLVE ORALLY).   pantoprazole 40 MG tablet Commonly known as: PROTONIX Take 40 mg by mouth daily.   Rivaroxaban 15 MG Tabs tablet Commonly known as: XARELTO Take 1 tablet (15 mg total) by mouth 2 (two) times daily. What changed:  when to take this  additional instructions   rivaroxaban 20 MG Tabs tablet Commonly known as: XARELTO Take 1 tablet (20 mg total) by mouth daily. What changed: Another medication with the same name was changed. Make sure you understand how and when to take each.   sodium chloride 0.65 % Soln nasal spray Commonly known as: OCEAN Place 2 sprays into both nostrils daily as needed for congestion (or allergic rhinitis).   sodium chloride flush 0.9 % Soln Commonly known as: NS 10-40 mLs by Intracatheter route as  needed (flush).   Systane Balance 0.6 % Soln Generic drug: Propylene Glycol Place 1 drop into both eyes every 6 (six) hours.   torsemide 20 MG tablet Commonly known as: DEMADEX Take 20 mg by mouth daily.   traZODone 150 MG tablet Commonly known as: DESYREL Take 1 tablet (150 mg total) by mouth at bedtime.   venlafaxine XR 75 MG 24 hr capsule Commonly known as: EFFEXOR-XR Take 225 mg by mouth daily with breakfast.   Vitamin D-3 25 MCG (1000 UT) Caps Take 1,000 Units by mouth daily.       Disposition and follow-up:   Kim Jenkins was discharged from [facility] in Stable condition.  At the hospital follow up visit please address:  1.  Acute on chronic hypoxic hypercapnic respiratory failure with persistent RLL infiltrate.  --discharged to complete 4 more days of antibiotic treatment with cefepime --consider follow up chest xray in 4-6w   2. Metabolic alkalosis. Possibly attributable to contraction alkalosis. Improved at discharge.  Labs / imaging needed at time of follow-up: none  Pending labs/ test needing follow-up: respiratory culture  Follow-up Appointments: PCP 3-5d.    Hospital Course: Kim Jenkins is a 54 yo female with a PMH of chronic hypercapnic, hypoxic respiratory failure with tracheostomy requiring ventilator use at night and OHS who presented to [facility] ED on 12/27/19 from [facility] for acute on chronic respiratory failure. CXR revealed a persistent RLL opacity for which she was treated for proteus miribalis last month.  She had increased ventilator requirement and was found to desaturate into the 80s when off the ventilator on admission.  Admission labs did not reveal a leukocytosis and she was afebrile however she was started on cefepime and azithromycin for the persistent RLL infiltrate. Respiratory culture was obtained on admission as well. Respiratory status improved following admission and she remained afebrile without leukocytosis.    Discharge Vitals:   BP (!) 87/37   Pulse 73   Temp 97.6 F (36.4 C) (Oral)   Resp 20   Ht 5\' 6"  (1.676 m)   Wt (!) 155.6 kg   SpO2 (!) 87%   BMI 55.37 kg/m   Pertinent Labs, Studies, and Procedures:  CXR: RLL opacity CBC Latest Ref Rng & Units 12/28/2019 12/28/2019 12/27/2019  WBC 4.0 - 10.5 K/uL 7.6 - -  Hemoglobin 12.0 - 15.0 g/dL 8.2(L) 10.5(L) 10.5(L)  Hematocrit 36.0 - 46.0 % 27.9(L) 31.0(L) 31.0(L)  Platelets 150 - 400 K/uL 164 - -   BMP Latest Ref Rng & Units 12/29/2019 12/28/2019 12/28/2019  Glucose 70 - 99 mg/dL 172(H) 142(H) -  BUN 6 - 20 mg/dL 14 13 -  Creatinine 0.44 - 1.00 mg/dL 0.66 0.63 -  Sodium 135 - 145 mmol/L 136 140 139  Potassium 3.5 - 5.1 mmol/L 3.5 3.2(L) 3.3(L)  Chloride 98 - 111 mmol/L 88(L) 88(L) -  CO2 22 - 32 mmol/L 40(H) 42(H) -  Calcium 8.9 - 10.3  mg/dL 8.9 9.1 -     Signed: Mitzi Hansen, MD 12/29/2019, 11:05 AM   Pager: [phone]

## 2019-12-29 NOTE — Progress Notes (Signed)
CSW spke with Prem at [facility] who states this patient can return today.   The number to call for report is [phone]. The patient will go to room 312-B. The accepting MD is Dr. Nona Dell. The patient will be transported via [place].  Madilyn Fireman, MSW, LCSW-A Transitions of Care  Clinical Social Worker  [facility] Emergency Departments  Medical ICU [phone]

## 2019-12-29 NOTE — Discharge Instructions (Signed)
Information on my medicine - XARELTO (rivaroxaban)  This medication education was reviewed with me or my healthcare representative as part of my discharge preparation.    WHY WAS XARELTO PRESCRIBED FOR YOU? Xarelto was prescribed to treat blood clots that may have been found in the veins of your legs (deep vein thrombosis) or in your lungs (pulmonary embolism) and to reduce the risk of them occurring again.  What do you need to know about Xarelto? The starting dose is one 15 mg tablet taken TWICE daily with food for the FIRST 21 DAYS then on (enter date)  ***  the dose is changed to one 20 mg tablet taken ONCE A DAY with your evening meal.  DO NOT stop taking Xarelto without talking to the health care provider who prescribed the medication.  Refill your prescription for 20 mg tablets before you run out.  After discharge, you should have regular check-up appointments with your healthcare provider that is prescribing your Xarelto.  In the future your dose may need to be changed if your kidney function changes by a significant amount.  What do you do if you miss a dose? If you are taking Xarelto TWICE DAILY and you miss a dose, take it as soon as you remember. You may take two 15 mg tablets (total 30 mg) at the same time then resume your regularly scheduled 15 mg twice daily the next day.  If you are taking Xarelto ONCE DAILY and you miss a dose, take it as soon as you remember on the same day then continue your regularly scheduled once daily regimen the next day. Do not take two doses of Xarelto at the same time.   Important Safety Information Xarelto is a blood thinner medicine that can cause bleeding. You should call your healthcare provider right away if you experience any of the following: ? Bleeding from an injury or your nose that does not stop. ? Unusual colored urine (red or dark brown) or unusual colored stools (red or black). ? Unusual bruising for unknown reasons. ? A  serious fall or if you hit your head (even if there is no bleeding).  Some medicines may interact with Xarelto and might increase your risk of bleeding while on Xarelto. To help avoid this, consult your healthcare provider or pharmacist prior to using any new prescription or non-prescription medications, including herbals, vitamins, non-steroidal anti-inflammatory drugs (NSAIDs) and supplements.  This website has more information on Xarelto: [email or web address]

## 2019-12-29 NOTE — Progress Notes (Signed)
Report called to [place] pt is going to room 312B at [facility]

## 2019-12-31 LAB — CULTURE, RESPIRATORY W GRAM STAIN

## 2020-01-22 ENCOUNTER — Encounter: Payer: Self-pay | Admitting: Internal Medicine

## 2020-01-22 NOTE — Progress Notes (Unsigned)
Received notification by Dr. Anselm Pancoast, radiology, on 01/22/20 regarding her 12/04/19 CTA chest.  Dr. Anselm Pancoast noting that after review, "there are not definitive pulmonary emboli. The report mentioned bilateral segmental nonocclusive pulmonary emboli but these findings could be related to artifact and incomplete opacification of the pulmonary arteries. Based on these exam findings, it is difficult to evaluate for small pulmonary emboli in the segmental and more distal branches but there is no evidence for pulmonary embolism in the main or lobar pulmonary arteries."  Dr. Anselm Pancoast reached out to me to contact the patient regarding the xarelto and potentially discontinuing it.  Chart reviewed and Dr. Kara Mead from [facility] was supervising physician at time of discharge so I will reach out and discuss with him prior to any changes.  Mitzi Hansen, MD 01/22/20  3:41 PM   Addendum: Spoke with Dr. Elsworth Soho who agrees that anticoagulation can be discontinued. Attempted to contact patient's physician at [facility] however spoke to floor nurse there who notes that he was not available but that she would relay my callback request. Contact information left with RN. Awaiting callback.   4:47 PM

## 2020-01-27 DIAGNOSIS — E662 Morbid (severe) obesity with alveolar hypoventilation: Secondary | ICD-10-CM

## 2020-01-27 DIAGNOSIS — J449 Chronic obstructive pulmonary disease, unspecified: Secondary | ICD-10-CM

## 2020-01-27 DIAGNOSIS — J9621 Acute and chronic respiratory failure with hypoxia: Secondary | ICD-10-CM

## 2020-01-27 DIAGNOSIS — Z930 Tracheostomy status: Secondary | ICD-10-CM

## 2020-01-29 DIAGNOSIS — J9621 Acute and chronic respiratory failure with hypoxia: Secondary | ICD-10-CM | POA: Diagnosis not present

## 2020-01-29 DIAGNOSIS — J449 Chronic obstructive pulmonary disease, unspecified: Secondary | ICD-10-CM | POA: Diagnosis not present

## 2020-01-29 DIAGNOSIS — Z930 Tracheostomy status: Secondary | ICD-10-CM | POA: Diagnosis not present

## 2020-01-29 DIAGNOSIS — E662 Morbid (severe) obesity with alveolar hypoventilation: Secondary | ICD-10-CM | POA: Diagnosis not present

## 2020-02-05 ENCOUNTER — Other Ambulatory Visit (HOSPITAL_COMMUNITY): Payer: Self-pay | Admitting: Internal Medicine

## 2020-02-05 ENCOUNTER — Other Ambulatory Visit: Payer: Self-pay | Admitting: Internal Medicine

## 2020-02-05 DIAGNOSIS — I2690 Septic pulmonary embolism without acute cor pulmonale: Secondary | ICD-10-CM

## 2020-02-10 DIAGNOSIS — J9621 Acute and chronic respiratory failure with hypoxia: Secondary | ICD-10-CM | POA: Diagnosis not present

## 2020-02-10 DIAGNOSIS — E662 Morbid (severe) obesity with alveolar hypoventilation: Secondary | ICD-10-CM | POA: Diagnosis not present

## 2020-02-10 DIAGNOSIS — Z930 Tracheostomy status: Secondary | ICD-10-CM | POA: Diagnosis not present

## 2020-02-10 DIAGNOSIS — J449 Chronic obstructive pulmonary disease, unspecified: Secondary | ICD-10-CM | POA: Diagnosis not present

## 2020-02-11 ENCOUNTER — Inpatient Hospital Stay (HOSPITAL_COMMUNITY): Admission: RE | Admit: 2020-02-11 | Payer: Medicare Other | Source: Ambulatory Visit

## 2020-02-12 DIAGNOSIS — E662 Morbid (severe) obesity with alveolar hypoventilation: Secondary | ICD-10-CM | POA: Diagnosis not present

## 2020-02-12 DIAGNOSIS — J449 Chronic obstructive pulmonary disease, unspecified: Secondary | ICD-10-CM | POA: Diagnosis not present

## 2020-02-12 DIAGNOSIS — Z930 Tracheostomy status: Secondary | ICD-10-CM | POA: Diagnosis not present

## 2020-02-12 DIAGNOSIS — J9621 Acute and chronic respiratory failure with hypoxia: Secondary | ICD-10-CM | POA: Diagnosis not present

## 2020-02-13 ENCOUNTER — Ambulatory Visit (HOSPITAL_COMMUNITY)
Admission: RE | Admit: 2020-02-13 | Discharge: 2020-02-13 | Disposition: A | Discharge status: Home / Self Care | Payer: Medicare Other | Source: Ambulatory Visit | Attending: Internal Medicine | Admitting: Internal Medicine

## 2020-02-13 DIAGNOSIS — J9811 Atelectasis: Secondary | ICD-10-CM | POA: Insufficient documentation

## 2020-02-13 DIAGNOSIS — R918 Other nonspecific abnormal finding of lung field: Secondary | ICD-10-CM | POA: Diagnosis not present

## 2020-02-13 DIAGNOSIS — I700 Atherosclerosis of aorta: Secondary | ICD-10-CM | POA: Diagnosis not present

## 2020-02-13 DIAGNOSIS — R079 Chest pain, unspecified: Secondary | ICD-10-CM | POA: Diagnosis present

## 2020-02-13 DIAGNOSIS — I2690 Septic pulmonary embolism without acute cor pulmonale: Secondary | ICD-10-CM

## 2020-02-13 DIAGNOSIS — I2510 Atherosclerotic heart disease of native coronary artery without angina pectoris: Secondary | ICD-10-CM | POA: Insufficient documentation

## 2020-02-13 MED ORDER — IOHEXOL 300 MG/ML  SOLN
75.0000 mL | Freq: Once | INTRAMUSCULAR | Status: AC | PRN
  Administered 2020-02-13: 75 mL via INTRAVENOUS

## 2020-03-31 IMAGING — CT CT ANGIO CHEST
2 of 7 series · 14 of 46 positions shown · IV contrast (omnipaque)
Comparison: September 29, 2012 report.
COMPARISON: September 29, 2012 report.

Addendum:
CLINICAL DATA: Dyspnea and chest pain

EXAM:
CT ANGIOGRAPHY CHEST WITH CONTRAST
TECHNIQUE: Multidetector CT imaging of the chest was performed using the
standard protocol during bolus administration of intravenous
contrast. Multiplanar CT image reconstructions and MIPs were
obtained to evaluate the vascular anatomy. Automatic exposure
control utilized.
CONTRAST:  80mL OMNIPAQUE IOHEXOL 350 MG/ML SOLN

[Series 7: thins · axial · 0.98mm/px · z∈[+1042,+1332]mm · 11 of 466 slices shown]
[im 26/466  lung]
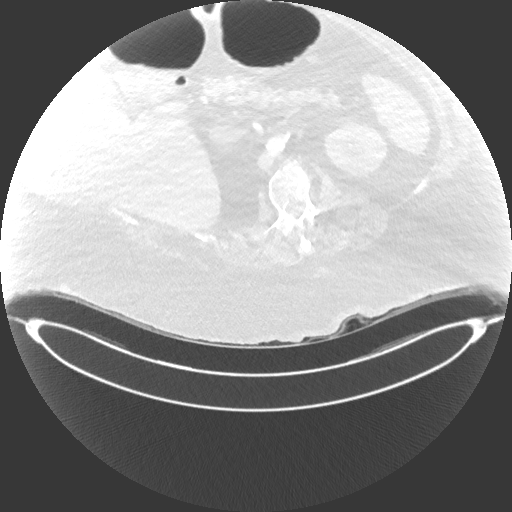
[im 78/466  soft-tissue]
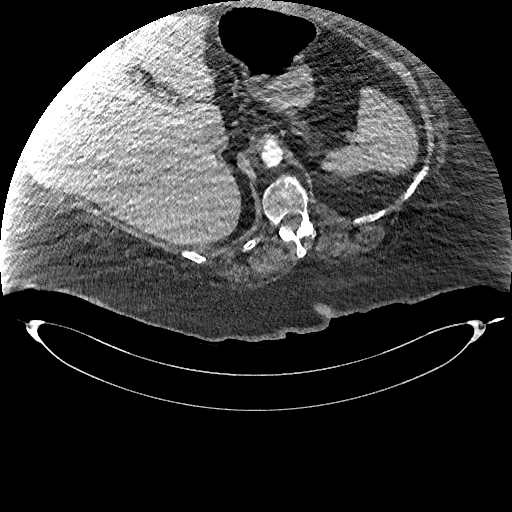
[im 104/466  lung]
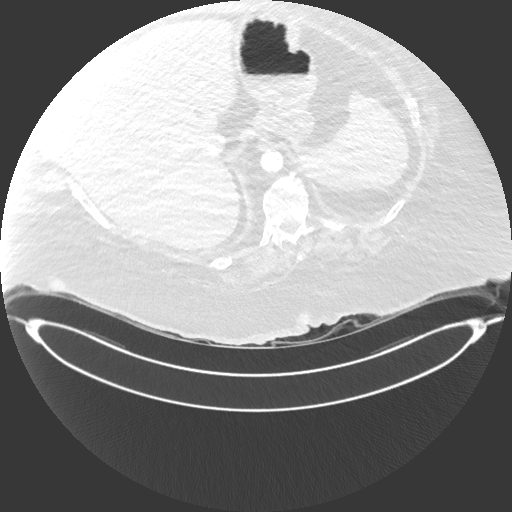
[im 156/466  soft-tissue]
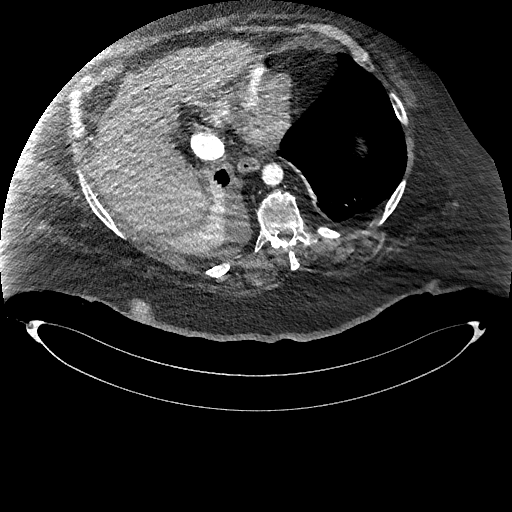
[im 181/466  lung]
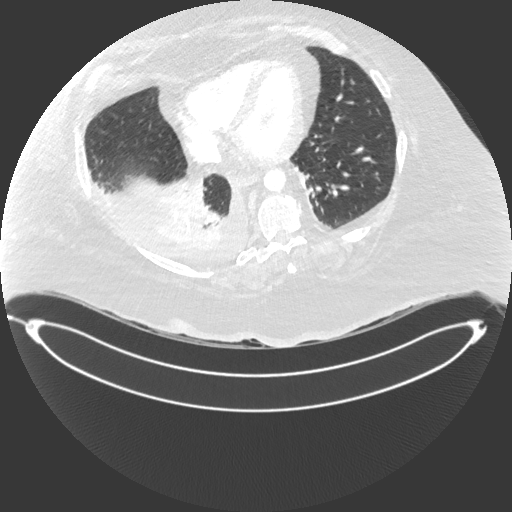
[im 233/466  soft-tissue]
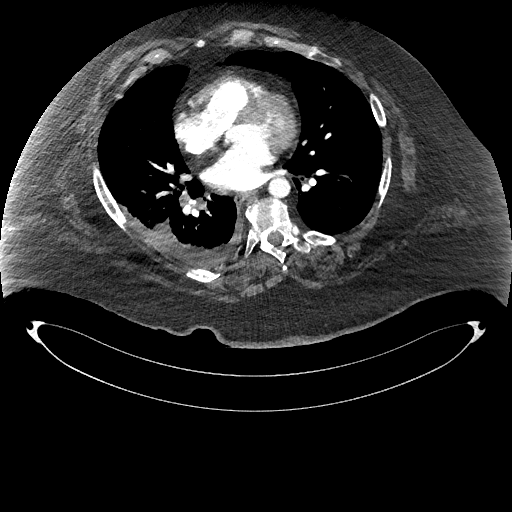
[im 285/466  lung]
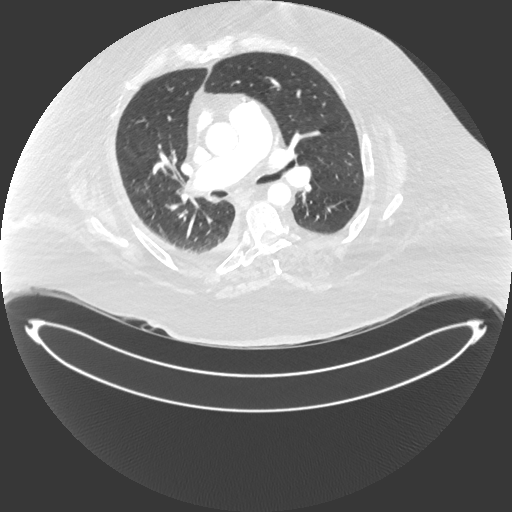
[im 311/466  soft-tissue]
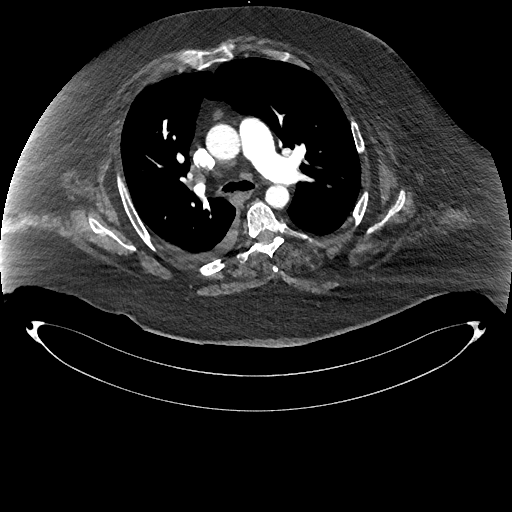
[im 362/466  lung]
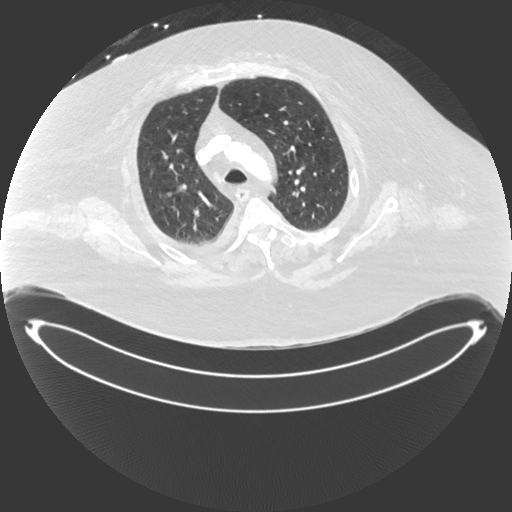
[im 388/466  soft-tissue]
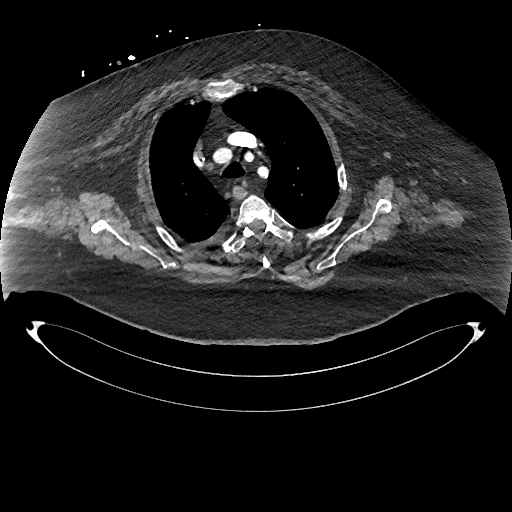
[im 440/466  lung]
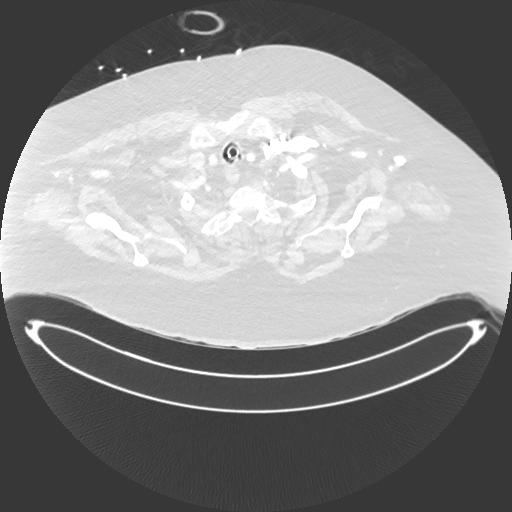

[Series 8: cor · coronal · 0.67mm/px · 3 of 182 slices shown]
[im 46/182  soft-tissue]
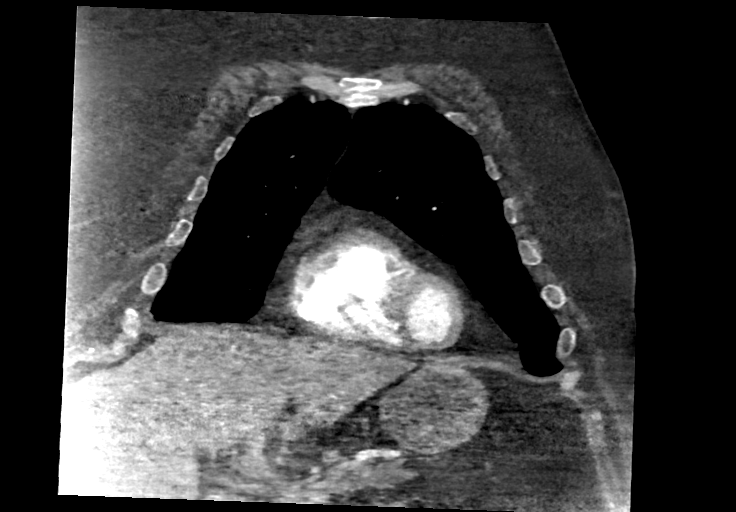
[im 91/182  soft-tissue]
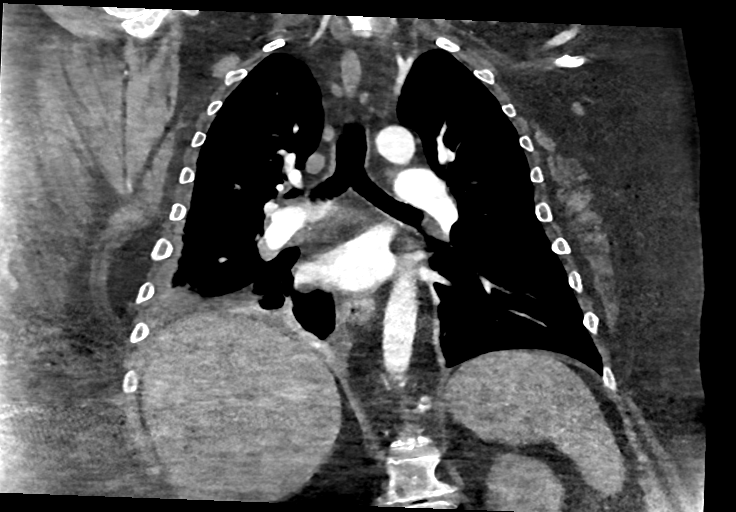
[im 136/182  soft-tissue]
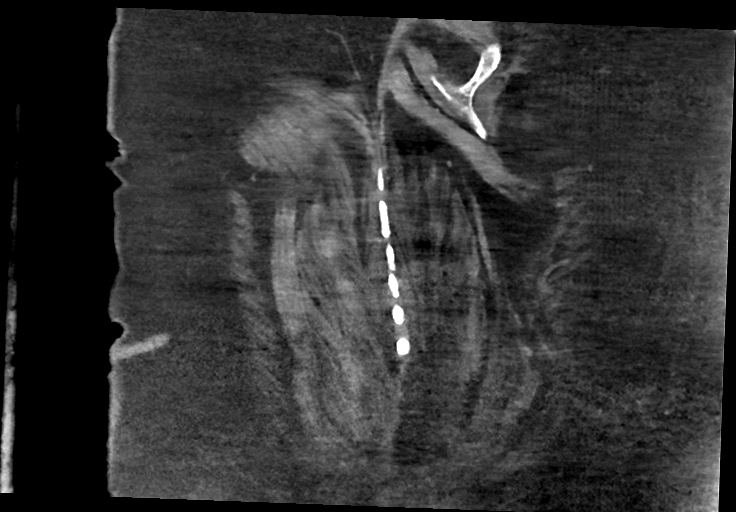

[14 of 46 positions shown; findings below may reference images not displayed]

FINDINGS: Cardiovascular: Pulmonary artery hypertension, the pulmonary trunk
measuring 3.4 cm diameter. Bilateral segmental nonocclusive
pulmonary emboli. No central pulmonary embolism. No right heart
strain. Thoracic aorta calcified atherosclerosis and valvular
calcification without aneurysmal dilatation. An ectatic 3.0 cm
tubular aorta. Focal moderate calcification of the right coronary
artery. Mild three-vessel coronary calcification. A right
peripherally inserted central catheter, its tip in the mid superior
vena cava.

Mediastinum/Nodes: A number of small nonspecific noncalcified
mediastinal and bilateral hilar lymph nodes. Small amount of 4
Hounsfield unit pericardial recess fluid.

Lungs/Pleura: Small right pleural effusion. Bibasilar subpleural
consolidation. Aspiration in the distal trachea, right mainstem
bronchus, bronchus intermedius, and right infrahilar endobronchial
tree. Mild centrilobular emphysema. A tracheostomy tube within the
tracheal airway, its tip 7 cm above the carina.

Upper Abdomen: Reflux of contrast material into the hepatic veins.
Mild hepatomegaly.

Musculoskeletal: Morbid obesity; portions of the bilateral chest
wall are not imaged / evaluated. Healed right 7th and 8th rib
fracture deformities.

Review of the MIP images confirms the above findings.

I discussed critical results by telephone at the time of
interpretation on 12/04/2019 at [DATE] with provider YOSHIFUMI AIT
, who verbally acknowledged these results.
IMPRESSION: Bilateral segmental nonocclusive pulmonary emboli. No evidence of
right heart strain.

Tracheostomy with aspiration in the distal trachea, right mainstem
bronchus, and right infrahilar endobronchial tree. Suctioning
recommended.

Small right pleural effusion and bibasilar subpleural consolidation
more pronounced on the right, possibly atelectatic or a developing
aspiration pneumonitis on the right. Probably atelectatic on the
left.

Pulmonary artery hypertension and evidence of right heart
dysfunction.

Focal moderate RCA calcification. Additional mild three-vessel
coronary calcification.

Morbid obesity with portions of the bilateral chest wall not imaged
/evaluated.

Aortic Atherosclerosis (587HP-STA.A) and Emphysema (587HP-P75.U).

Mild hepatomegaly.

ADDENDUM:
This exam was reviewed through the peer review process. The original
report was dictated by Dr. Mishal on 12/04/2019. Addendum dictated by
Dr. Tze Leung on 01/22/2020. After review, there are not definitive
pulmonary emboli. The report mentioned bilateral segmental
nonocclusive pulmonary emboli but these findings could be related to
artifact and incomplete opacification of the pulmonary arteries.
Based on these exam findings, it is difficult to evaluate for small
pulmonary emboli in the segmental and more distal branches but there
is no evidence for pulmonary embolism in the main or lobar pulmonary
arteries.

This addendum was discussed with Dr. Florence Adwoa Mai on
01/22/2020 at 4842.

*** End of Addendum ***
FINDINGS: Cardiovascular: Pulmonary artery hypertension, the pulmonary trunk
measuring 3.4 cm diameter. Bilateral segmental nonocclusive
pulmonary emboli. No central pulmonary embolism. No right heart
strain. Thoracic aorta calcified atherosclerosis and valvular
calcification without aneurysmal dilatation. An ectatic 3.0 cm
tubular aorta. Focal moderate calcification of the right coronary
artery. Mild three-vessel coronary calcification. A right
peripherally inserted central catheter, its tip in the mid superior
vena cava.

Mediastinum/Nodes: A number of small nonspecific noncalcified
mediastinal and bilateral hilar lymph nodes. Small amount of 4
Hounsfield unit pericardial recess fluid.

Lungs/Pleura: Small right pleural effusion. Bibasilar subpleural
consolidation. Aspiration in the distal trachea, right mainstem
bronchus, bronchus intermedius, and right infrahilar endobronchial
tree. Mild centrilobular emphysema. A tracheostomy tube within the
tracheal airway, its tip 7 cm above the carina.

Upper Abdomen: Reflux of contrast material into the hepatic veins.
Mild hepatomegaly.

Musculoskeletal: Morbid obesity; portions of the bilateral chest
wall are not imaged / evaluated. Healed right 7th and 8th rib
fracture deformities.

Review of the MIP images confirms the above findings.

I discussed critical results by telephone at the time of
interpretation on 12/04/2019 at [DATE] with provider YOSHIFUMI AIT
, who verbally acknowledged these results.
IMPRESSION: Bilateral segmental nonocclusive pulmonary emboli. No evidence of
right heart strain.

Tracheostomy with aspiration in the distal trachea, right mainstem
bronchus, and right infrahilar endobronchial tree. Suctioning
recommended.

Small right pleural effusion and bibasilar subpleural consolidation
more pronounced on the right, possibly atelectatic or a developing
aspiration pneumonitis on the right. Probably atelectatic on the
left.

Pulmonary artery hypertension and evidence of right heart
dysfunction.

Focal moderate RCA calcification. Additional mild three-vessel
coronary calcification.

Morbid obesity with portions of the bilateral chest wall not imaged
/evaluated.

Aortic Atherosclerosis (587HP-STA.A) and Emphysema (587HP-P75.U).

Mild hepatomegaly.

## 2020-04-01 IMAGING — DX DG CHEST 1V PORT
1 series · 1 of 1 positions shown · non-contrast
Comparison: December 04, 2019

CLINICAL DATA: Respiratory failure

EXAM:
PORTABLE CHEST 1 VIEW

[chest]
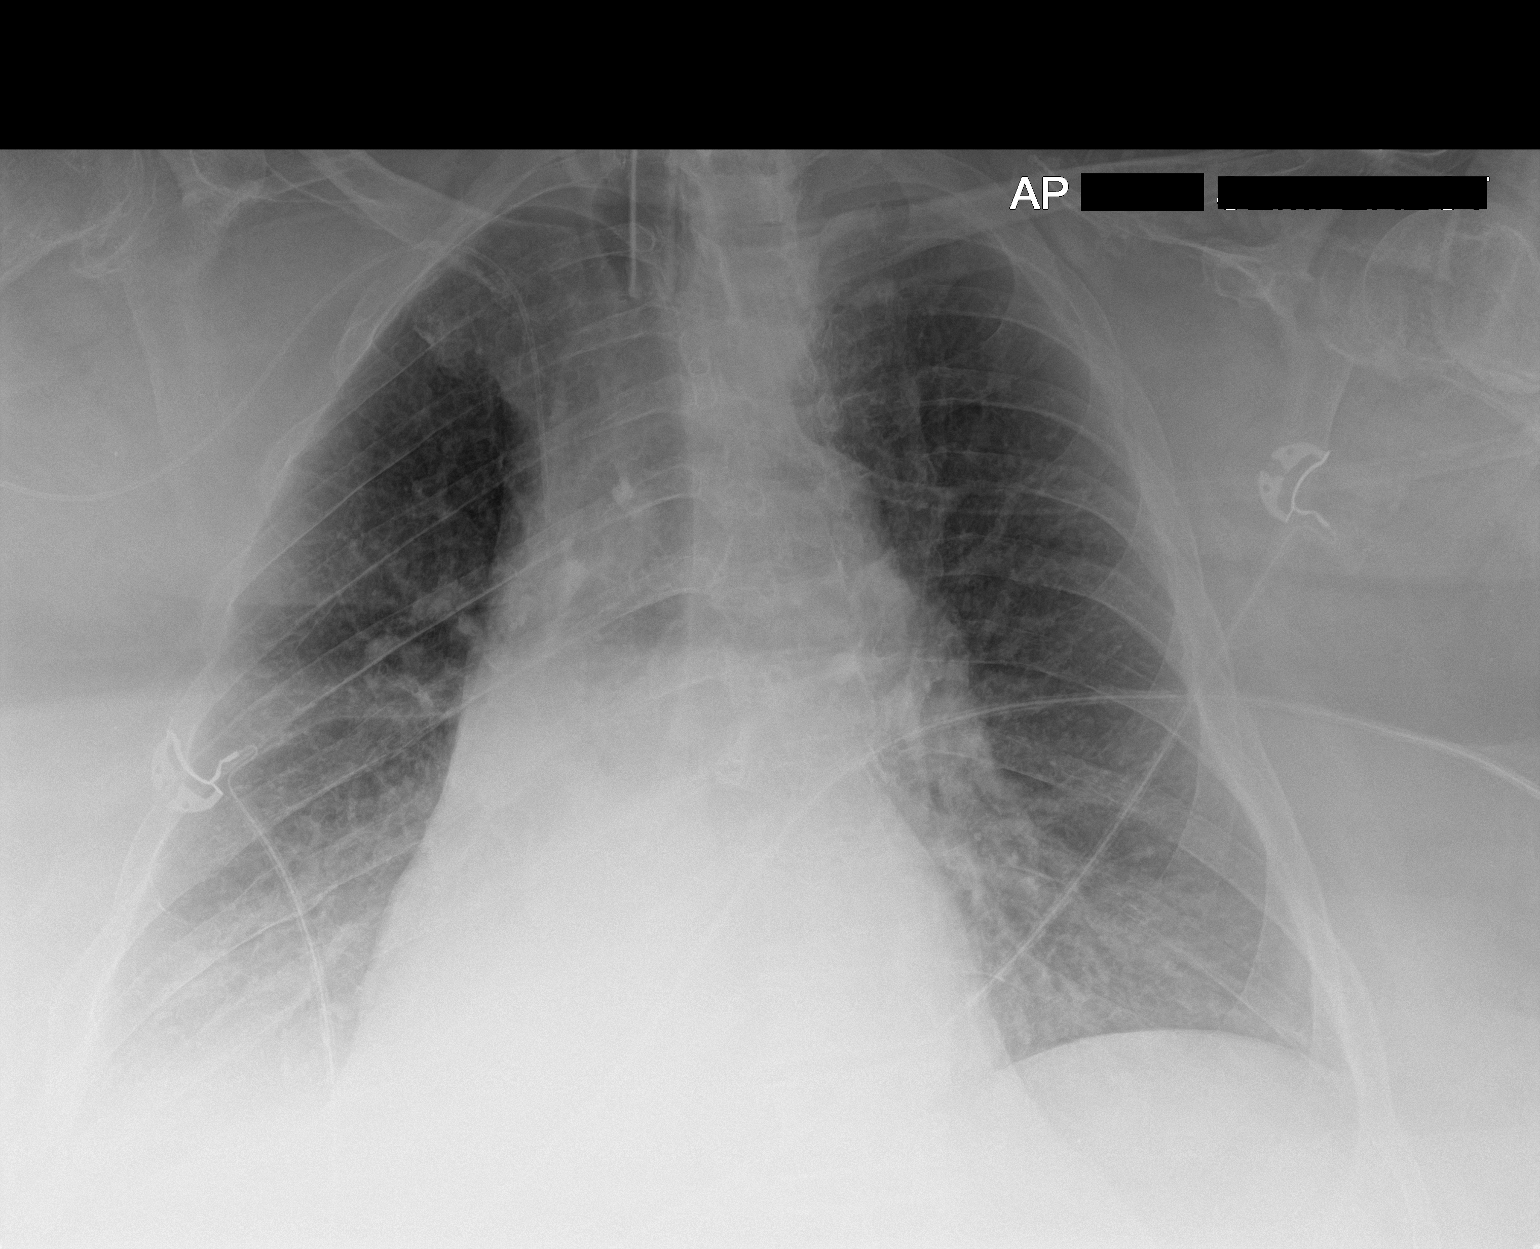

[1 of 1 positions shown; findings below may reference images not displayed]

FINDINGS: Tracheostomy catheter tip is 8.6 cm above the carina. Central
catheter tip is in the superior vena cava. No pneumothorax. There is
consolidation in the right lung base medially with small right
pleural effusion. Lungs elsewhere are clear. Heart is upper normal
in size with pulmonary vascularity normal. No adenopathy. No bone
lesions.
IMPRESSION: Tube and catheter positions as described without pneumothorax.
Consolidation right base medially, progressed from 1 day prior with
small right pleural effusion. Left lung clear. Stable cardiac
silhouette.

## 2020-07-29 ENCOUNTER — Emergency Department (HOSPITAL_COMMUNITY)
Admission: EM | Admit: 2020-07-29 | Discharge: 2020-07-29 | Disposition: A | Discharge status: Home / Self Care | Payer: Medicare Other | Attending: Emergency Medicine | Admitting: Emergency Medicine

## 2020-07-29 DIAGNOSIS — F341 Dysthymic disorder: Secondary | ICD-10-CM | POA: Insufficient documentation

## 2020-07-29 DIAGNOSIS — Z7901 Long term (current) use of anticoagulants: Secondary | ICD-10-CM | POA: Insufficient documentation

## 2020-07-29 DIAGNOSIS — I10 Essential (primary) hypertension: Secondary | ICD-10-CM | POA: Diagnosis not present

## 2020-07-29 DIAGNOSIS — F32A Depression, unspecified: Secondary | ICD-10-CM | POA: Insufficient documentation

## 2020-07-29 DIAGNOSIS — Z794 Long term (current) use of insulin: Secondary | ICD-10-CM | POA: Insufficient documentation

## 2020-07-29 DIAGNOSIS — E119 Type 2 diabetes mellitus without complications: Secondary | ICD-10-CM | POA: Insufficient documentation

## 2020-07-29 DIAGNOSIS — F332 Major depressive disorder, recurrent severe without psychotic features: Secondary | ICD-10-CM | POA: Diagnosis not present

## 2020-07-29 DIAGNOSIS — Z79899 Other long term (current) drug therapy: Secondary | ICD-10-CM | POA: Insufficient documentation

## 2020-07-29 LAB — CBC WITH DIFFERENTIAL/PLATELET
Abs Immature Granulocytes: 0.04 10*3/uL (ref 0.00–0.07)
Basophils Absolute: 0 10*3/uL (ref 0.0–0.1)
Basophils Relative: 0 %
Eosinophils Absolute: 0.2 10*3/uL (ref 0.0–0.5)
Eosinophils Relative: 2 %
HCT: 36.2 % (ref 36.0–46.0)
Hemoglobin: 10.8 g/dL — ABNORMAL LOW (ref 12.0–15.0)
Immature Granulocytes: 1 %
Lymphocytes Relative: 27 %
Lymphs Abs: 2 10*3/uL (ref 0.7–4.0)
MCH: 27.9 pg (ref 26.0–34.0)
MCHC: 29.8 g/dL — ABNORMAL LOW (ref 30.0–36.0)
MCV: 93.5 fL (ref 80.0–100.0)
Monocytes Absolute: 0.9 10*3/uL (ref 0.1–1.0)
Monocytes Relative: 11 %
Neutro Abs: 4.6 10*3/uL (ref 1.7–7.7)
Neutrophils Relative %: 59 %
Platelets: 204 10*3/uL (ref 150–400)
RBC: 3.87 MIL/uL (ref 3.87–5.11)
RDW: 14.9 % (ref 11.5–15.5)
WBC: 7.7 10*3/uL (ref 4.0–10.5)
nRBC: 0 % (ref 0.0–0.2)

## 2020-07-29 LAB — COMPREHENSIVE METABOLIC PANEL
ALT: 11 U/L (ref 0–44)
AST: 14 U/L — ABNORMAL LOW (ref 15–41)
Albumin: 3.1 g/dL — ABNORMAL LOW (ref 3.5–5.0)
Alkaline Phosphatase: 47 U/L (ref 38–126)
Anion gap: 12 (ref 5–15)
BUN: 17 mg/dL (ref 6–20)
CO2: 37 mmol/L — ABNORMAL HIGH (ref 22–32)
Calcium: 8.9 mg/dL (ref 8.9–10.3)
Chloride: 89 mmol/L — ABNORMAL LOW (ref 98–111)
Creatinine, Ser: 0.63 mg/dL (ref 0.44–1.00)
GFR, Estimated: >60 mL/min (ref >60)
Glucose, Bld: 123 mg/dL — ABNORMAL HIGH (ref 70–99)
Potassium: 4 mmol/L (ref 3.5–5.1)
Sodium: 138 mmol/L (ref 135–145)
Total Bilirubin: 0.3 mg/dL (ref 0.3–1.2)
Total Protein: 8.1 g/dL (ref 6.5–8.1)

## 2020-07-29 MED ORDER — OXYCODONE-ACETAMINOPHEN 5-325 MG PO TABS
1.0000 | ORAL_TABLET | Freq: Once | ORAL | Status: AC
  Administered 2020-07-29: 1 via ORAL
  Filled 2020-07-29: qty 1

## 2020-07-29 NOTE — Consult Note (Signed)
[facility] Psych ED Discharge  07/29/2020 11:35 AM Kim Jenkins  MRN:  [id number] Principal Problem: Depression Discharge Diagnoses: Principal Problem:   Depression   Subjective: Patient states "I just got upset."  Patient reports readiness to discharge back to facility.  Patient reports when feeling upset she reported she "wanted to shoot myself."  Patient denies suicidal ideations currently.  Patient reports current stressors include pain in neck and shoulders.  Patient reports the pain is "so bad and I cannot do anything about it."  Patient states she has tried morphine in the past.  Patient reports an allergy has been documented on her medical record but she does not believe that she has an allergy to morphine.  Patient asked that pain medication given in the emergency department be considered at her current facility.  Patient reports she does not believe current facility manages her pain effectively.  Patient resides at [facility] times approximately 6 years.  Patient denies access to weapons.  Patient reports staff at [facility] "boss me around a lot."  Patient reports for approximately 1 year she feels that staff have attempted to "boss" her around.  Patient reports feeling frustrated when staff members attempt to tell her things to do.  Patient reports she is currently followed by psychiatry at [facility].  Patient reports she sometimes speaks with them virtually.  Patient reports she is uncertain how often she sees them but "it is a lot."  Patient reports she has been diagnosed with bipolar disorder in the past.  Patient uncertain of medications but believes she currently takes trazodone, Abilify, Valium and Effexor.  Patient reports compliance with medications.  Patient endorses decreased sleep and appetite x1 year.  Patient assessed by nurse practitioner.  Patient alert and oriented, answers appropriately.  Patient pleasant cooperative during assessment.  Patient denies suicidal  and homicidal ideations.  Patient denies auditory and visual hallucinations.  There is no evidence of delusional thought content and no indication patient is responding to internal stimuli.  Patient denies symptoms of paranoia.  Patient offered support and encouragement.   Total Time spent with patient: 30 minutes  Past Psychiatric History: Bipolar 1 disorder, depression  Past Medical History:  Past Medical History:  Diagnosis Date  . Bipolar 1 disorder ([place])   . Chronic pain   . Depression   . Diabetes mellitus ([place])   . Hypertension   . Kidney stones   . Morbid obesity ([place])   . Obesity hypoventilation syndrome ([place])   . Obstructive sleep apnea   . Panniculitis     Past Surgical History:  Procedure Laterality Date  . ESOPHAGOGASTRODUODENOSCOPY N/A 05/06/2018   Procedure: ESOPHAGOGASTRODUODENOSCOPY (EGD);  Surgeon: Irving Copas., MD;  Location: [facility];  Service: Gastroenterology;  Laterality: N/A;  . RIGHT OOPHORECTOMY    . TRACHEOSTOMY    . VENTRAL HERNIA REPAIR     Family History: No family history on file. Family Psychiatric  History: None reported  Social History:  Social History   Substance and Sexual Activity  Alcohol Use No     Social History   Substance and Sexual Activity  Drug Use No    Social History   Socioeconomic History  . Marital status: Single    Spouse name: Not on file  . Number of children: Not on file  . Years of education: Not on file  . Highest education level: Not on file  Occupational History  . Not on file  Tobacco Use  . Smoking status: Never  Smoker  . Smokeless tobacco: Never Used  Vaping Use  . Vaping Use: Never used  Substance and Sexual Activity  . Alcohol use: No  . Drug use: No  . Sexual activity: Not on file  Other Topics Concern  . Not on file  Social History Narrative  . Not on file   Social Determinants of Health   Financial Resource Strain:   . Difficulty of Paying Living Expenses: Not on  file  Food Insecurity:   . Worried About Charity fundraiser in the Last Year: Not on file  . Ran Out of Food in the Last Year: Not on file  Transportation Needs:   . Lack of Transportation (Medical): Not on file  . Lack of Transportation (Non-Medical): Not on file  Physical Activity:   . Days of Exercise per Week: Not on file  . Minutes of Exercise per Session: Not on file  Stress:   . Feeling of Stress : Not on file  Social Connections:   . Frequency of Communication with Friends and Family: Not on file  . Frequency of Social Gatherings with Friends and Family: Not on file  . Attends Religious Services: Not on file  . Active Member of Clubs or Organizations: Not on file  . Attends Archivist Meetings: Not on file  . Marital Status: Not on file    Has this patient used any form of tobacco in the last 30 days? (Cigarettes, Smokeless Tobacco, Cigars, and/or Pipes) A prescription for an FDA-approved tobacco cessation medication was offered at discharge and the patient refused  Current Medications: No current facility-administered medications for this encounter.   Current Outpatient Medications  Medication Sig Dispense Refill  . ARIPiprazole (ABILIFY) 2 MG tablet Take 2 mg by mouth daily.    . Cholecalciferol (VITAMIN D-3) 25 MCG (1000 UT) CAPS Take 1,000 Units by mouth daily.    Marland Kitchen dextromethorphan-guaiFENesin (ROBITUSSIN-DM) 10-100 MG/5ML liquid Take 5 mLs by mouth every 6 (six) hours.     . diazepam (VALIUM) 2 MG tablet Take 2 mg by mouth 2 (two) times daily.    . divalproex (DEPAKOTE ER) 500 MG 24 hr tablet Take 2 tablets (1,000 mg total) by mouth every 12 (twelve) hours. 120 tablet 0  . Exenatide ER (BYDUREON) 2 MG PEN Inject 2 mg into the skin every Friday.     . famotidine (PEPCID) 20 MG tablet Take 20 mg by mouth daily.     . fluticasone (FLONASE) 50 MCG/ACT nasal spray Place 2 sprays into both nostrils daily.     . hydrALAZINE (APRESOLINE) 50 MG tablet Take 50 mg by  mouth every 6 (six) hours as needed (SBP > 160 and DPB > 100).    . insulin glargine (LANTUS SOLOSTAR) 100 UNIT/ML Solostar Pen Inject 20 Units into the skin daily.    Marland Kitchen ipratropium-albuterol (DUONEB) 0.5-2.5 (3) MG/3ML SOLN Inhale 3 mLs into the lungs as needed (for dyspnea).     . lidocaine (LIDODERM) 5 % Place 1 patch onto the skin daily. Remove & Discard patch within 12 hours or as directed by MD    . loperamide (IMODIUM A-D) 2 MG tablet Take 2 mg by mouth every 6 (six) hours as needed for diarrhea or loose stools.     . metoprolol tartrate (LOPRESSOR) 50 MG tablet Take 50 mg by mouth every 12 (twelve) hours.     . Multiple Vitamin (MULTIVITAMIN WITH MINERALS) TABS tablet Take 1 tablet by mouth daily.    Marland Kitchen  nitroGLYCERIN (NITROSTAT) 0.4 MG SL tablet Place 0.4 mg under the tongue every 5 (five) minutes x 3 doses as needed for chest pain (AND CALL PROVIDER).     Marland Kitchen olopatadine (PATANOL) 0.1 % ophthalmic solution Place 1 drop into both eyes 2 (two) times daily.    . ondansetron (ZOFRAN-ODT) 4 MG disintegrating tablet Take 4 mg by mouth every 6 (six) hours as needed for nausea or vomiting (DISSOLVE ORALLY).    Marland Kitchen potassium chloride SA (KLOR-CON) 20 MEQ tablet Take 20 mEq by mouth daily.    Marland Kitchen Propylene Glycol (SYSTANE BALANCE) 0.6 % SOLN Place 1 drop into both eyes every 6 (six) hours.     . sodium chloride (OCEAN) 0.65 % SOLN nasal spray Place 2 sprays into both nostrils daily as needed for congestion (or allergic rhinitis).     . sodium chloride flush (NS) 0.9 % SOLN 10-40 mLs by Intracatheter route as needed (flush). (Patient taking differently: Inject 10 mLs into the vein 2 (two) times daily. )    . torsemide (DEMADEX) 20 MG tablet Take 20 mg by mouth daily.    . traZODone (DESYREL) 150 MG tablet Take 1 tablet (150 mg total) by mouth at bedtime. 30 tablet 0  . venlafaxine XR (EFFEXOR-XR) 75 MG 24 hr capsule Take 225 mg by mouth daily with breakfast.    . XTAMPZA ER 9 MG C12A Take 9 mg by mouth 2  (two) times daily.    . ARIPiprazole (ABILIFY) 5 MG tablet Take 1 tablet (5 mg total) by mouth daily. (Patient not taking: Reported on 07/29/2020) 30 tablet 0  . Rivaroxaban (XARELTO) 15 MG TABS tablet Take 1 tablet (15 mg total) by mouth 2 (two) times daily. (Patient not taking: Reported on 07/29/2020) 42 tablet   . rivaroxaban (XARELTO) 20 MG TABS tablet Take 1 tablet (20 mg total) by mouth daily. (Patient not taking: Reported on 07/29/2020) 30 tablet    PTA Medications: (Not in a hospital admission)   Musculoskeletal: Strength & Muscle Tone: decreased Gait & Station: unable to assess Patient leans: N/A  Psychiatric Specialty Exam: Physical Exam Vitals and nursing note reviewed.  Constitutional:      Appearance: She is well-developed.  HENT:     Head: Normocephalic.  Cardiovascular:     Rate and Rhythm: Normal rate.  Pulmonary:     Effort: Pulmonary effort is normal.  Neurological:     Mental Status: She is alert and oriented to person, place, and time.  Psychiatric:        Attention and Perception: Attention and perception normal.        Mood and Affect: Mood and affect normal.        Speech: Speech normal.        Behavior: Behavior normal. Behavior is cooperative.        Thought Content: Thought content normal.        Cognition and Memory: Cognition and memory normal.        Judgment: Judgment normal.     Review of Systems  Constitutional: Negative.   HENT: Negative.   Eyes: Negative.   Respiratory: Negative.   Cardiovascular: Negative.   Gastrointestinal: Negative.   Genitourinary: Negative.   Musculoskeletal: Negative.   Skin: Negative.   Neurological: Negative.   Psychiatric/Behavioral: Negative.     Blood pressure 135/74, pulse 86, temperature 99.2 F (37.3 C), temperature source Oral, resp. rate (!) 24, SpO2 (!) 89 %.There is no height or weight on file to calculate BMI.  General Appearance: Casual  Eye Contact:  Good  Speech:  Clear and Coherent and  Normal Rate  Volume:  Normal  Mood:  Depressed  Affect:  Appropriate and Congruent  Thought Process:  Coherent, Goal Directed and Descriptions of Associations: Intact  Orientation:  Full (Time, Place, and Person)  Thought Content:  WDL and Logical  Suicidal Thoughts:  No   Homicidal Thoughts:  No  Memory:  Immediate;   Good Recent;   Good Remote;   Good  Judgement:  Fair  Insight:  Fair  Psychomotor Activity:  Normal  Concentration:  Concentration: Good and Attention Span: Good  Recall:  Good  Fund of Knowledge:  Good  Language:  Good  Akathisia:  No  Handed:  Right  AIMS (if indicated):     Assets:  Communication Skills Desire for Improvement Financial Resources/Insurance Resilience Social Support  ADL's:  Impaired  Cognition:  WNL  Sleep:        Demographic Factors:  Caucasian  Loss Factors: NA  Historical Factors: NA  Risk Reduction Factors:   Positive social support, Positive therapeutic relationship and Positive coping skills or problem solving skills  Continued Clinical Symptoms:  Medical Diagnoses and Treatments/Surgeries  Cognitive Features That Contribute To Risk:  None    Suicide Risk:  Minimal: No identifiable suicidal ideation.  Patients presenting with no risk factors but with morbid ruminations; may be classified as minimal risk based on the severity of the depressive symptoms    Plan Of Care/Follow-up recommendations:  Other:  Patient reviewed with Dr. Hampton Abbot  Follow-up with established psychiatric provider.  Disposition: Patient cleared by psychiatry. Emmaline Kluver, FNP 07/29/2020, 11:35 AM

## 2020-07-29 NOTE — BH Assessment (Signed)
Comprehensive Clinical Assessment (CCA) Note  07/29/2020 Kim Jenkins [id number]  Kim Jenkins is a 54 year old female presenting to [facility] with chief complaint of suicidal ideations.Per EDP note "Kim Jenkins is a 54 y.o. female.  Presents to ER with concern for psych eval.  Patient reports that she has felt somewhat depressed about her current status in life.  States that she has been living at [facility] for 8 years and does not enjoy living there.  She reports that she had commented to a staff member that she did not want to live any longer in this current state.  She denies any thoughts of suicide or actually completing any self-harm attempt.  She denies prior history of self-harm, suicide attempt."  Patient reports living at [facility] for 7 years and states "I'm tired of them bossing me around". Patient apparently told staff at [facility] that she was tired of living the way she was living, and they sent patient to ED for psychiatric evaluation. Patient reports symptoms of discontentment with staff and living conditions, difficulty sleeping at night but also having frequent sleeping spells during the day and pain in her back and shoulder. Patient admits telling staff that she was having SI with plans to shot self with a gun, but patient does not have means to a gun and is non ambulatory and morbidly obese. Patient states that she sees her psychiatric provider regularly and she has a history of bi-polar disorder.   Patient is oriented x4, engaged, alert and cooperative during assessment. Patient eye contact is normal, her speech is distorted due to tracheostomy collar, and patient reports severe pain in back and shoulder. Patient denies current SI/HI.   Disposition: Per Letitia Libra, NP, patient is psychiatrically cleared and recommended for discharge.    Chief Complaint:  Chief Complaint  Patient presents with  . Psychiatric Evaluation     CCA Screening, Triage and Referral (STR)  Patient Reported  Information How did you hear about [place]? No data recorded Referral name: No data recorded Referral phone number: No data recorded  Whom do you see for routine medical problems? No data recorded Practice/Facility Name: No data recorded Practice/Facility Phone Number: No data recorded Name of Contact: No data recorded Contact Number: No data recorded Contact Fax Number: No data recorded Prescriber Name: No data recorded Prescriber Address (if known): No data recorded  What Is the Reason for Your Visit/Call Today? No data recorded How Long Has This Been Causing You Problems? No data recorded What Do You Feel Would Help You the Most Today? No data recorded  Have You Recently Been in Any Inpatient Treatment (Hospital/Detox/Crisis Center/28-Day Program)? No data recorded Name/Location of Program/Hospital:No data recorded How Long Were You There? No data recorded When Were You Discharged? No data recorded  Have You Ever Received Services From [facility] Before? No data recorded Who Do You See at [facility]? No data recorded  Have You Recently Had Any Thoughts About Hurting Yourself? No data recorded Are You Planning to Commit Suicide/Harm Yourself At This time? No data recorded  Have you Recently Had Thoughts About [place]? No data recorded Explanation: No data recorded  Have You Used Any Alcohol or Drugs in the Past 24 Hours? No data recorded How Long Ago Did You Use Drugs or Alcohol? No data recorded What Did You Use and How Much? No data recorded  Do You Currently Have a Therapist/Psychiatrist? No data recorded Name of Therapist/Psychiatrist: No data recorded  Have You Been Recently  Discharged From Any Office Practice or Programs? No data recorded Explanation of Discharge From Practice/Program: No data recorded    CCA Screening Triage Referral Assessment Type of Contact: No data recorded Is this Initial or Reassessment? No data recorded Date Telepsych consult  ordered in [facility]:  No data recorded Time Telepsych consult ordered in [facility]:  No data recorded  Patient Reported Information Reviewed? No data recorded Patient Left Without Being Seen? No data recorded Reason for Not Completing Assessment: No data recorded  Collateral Involvement: No data recorded  Does Patient Have a [place]? No data recorded Name and Contact of Legal Guardian: No data recorded If Minor and Not Living with Parent(s), Who has Custody? No data recorded Is CPS involved or ever been involved? No data recorded Is APS involved or ever been involved? No data recorded  Patient Determined To Be At Risk for Harm To Self or Others Based on Review of Patient Reported Information or Presenting Complaint? No data recorded Method: No data recorded Availability of Means: No data recorded Intent: No data recorded Notification Required: No data recorded Additional Information for Danger to Others Potential: No data recorded Additional Comments for Danger to Others Potential: No data recorded Are There Guns or Other Weapons in Your Home? No data recorded Types of Guns/Weapons: No data recorded Are These Weapons Safely Secured?                            No data recorded Who Could Verify You Are Able To Have These Secured: No data recorded Do You Have any Outstanding Charges, Pending Court Dates, Parole/Probation? No data recorded Contacted To Inform of Risk of Harm To Self or Others: No data recorded  Location of Assessment: No data recorded  Does Patient Present under Involuntary Commitment? No data recorded IVC Papers Initial File Date: No data recorded  [place] of Residence: No data recorded  Patient Currently Receiving the Following Services: No data recorded  Determination of Need: No data recorded  Options For Referral: No data recorded    CCA Biopsychosocial Intake/Chief Complaint:  suicidal ideation  Current Symptoms/Problems: Patient reports  symptoms of discontentment with staff and living conditions, difficulty sleeping at night but also having frequent sleeping spells during the day and pain in her back and shoulder.   Patient Reported Schizophrenia/Schizoaffective Diagnosis in Past: No   Strengths: UTA  Preferences: UTA  Abilities: UTA   Type of Services Patient Feels are Needed: No data recorded  Initial Clinical Notes/Concerns: No data recorded  Mental Health Symptoms Depression:  Sleep (too much or little);Irritability   Duration of Depressive symptoms: Greater than two weeks   Mania:  None   Anxiety:   None   Psychosis:  None   Duration of Psychotic symptoms: No data recorded  Trauma:  None   Obsessions:  None   Compulsions:  None   Inattention:  None   Hyperactivity/Impulsivity:  N/A   Oppositional/Defiant Behaviors:  None   Emotional Irregularity:  None   Other Mood/Personality Symptoms:  No data recorded   Mental Status Exam Appearance and self-care  Stature:  Average   Weight:  Obese   Clothing:  No data recorded  Grooming:  Neglected   Cosmetic use:  None   Posture/gait:  Stooped   Motor activity:  Slowed   Sensorium  Attention:  Normal   Concentration:  Normal   Orientation:  Time;Situation;Place;Person   Recall/memory:  Normal  Affect and Mood  Affect:  Appropriate   Mood:  Depressed   Relating  Eye contact:  Normal   Facial expression:  Responsive;Tense   Attitude toward examiner:  Cooperative   Thought and Language  Speech flow: Slow;Slurred;Articulation error   Thought content:  Appropriate to Mood and Circumstances   Preoccupation:  None   Hallucinations:  None   Organization:  No data recorded  [employer] of Knowledge:  Fair   Intelligence:  Needs investigation   Abstraction:  Normal   Judgement:  Fair   Reality Testing:  Adequate   Insight:  Fair   Decision Making:  Normal   Social Functioning  Social Maturity:   No data recorded  Social Judgement:  No data recorded  Stress  Stressors:  Illness;Housing   Coping Ability:  Exhausted   Skill Deficits:  Activities of daily living;Decision making;Responsibility;Self-care   Supports:  Friends/Service system     Religion:    Leisure/Recreation:    Exercise/Diet:     CCA Employment/Education Employment/Work Situation: Employment / Work Situation Why is patient on disability: UTA How long has patient been on disability: UTA What is the longest time patient has a held a job?: UTA Where was the patient employed at that time?: UTA  Education: Education Is Patient Currently Attending School?: No   CCA Family/Childhood History Family and Relationship History: Family history What is your sexual orientation?: UTA Has your sexual activity been affected by drugs, alcohol, medication, or emotional stress?: UTA  Childhood History:  Childhood History Additional childhood history information: UTA Description of patient's relationship with caregiver when they were a child: UTA Patient's description of current relationship with people who raised him/her: UTA How were you disciplined when you got in trouble as a child/adolescent?: UTA  Child/Adolescent Assessment:     CCA Substance Use Alcohol/Drug Use: Alcohol / Drug Use Pain Medications: See MAR Prescriptions: See MAR Over the Counter: See MAR   ASAM's:  Six Dimensions of Multidimensional Assessment  Dimension 1:  Acute Intoxication and/or Withdrawal Potential:      Dimension 2:  Biomedical Conditions and Complications:      Dimension 3:  Emotional, Behavioral, or Cognitive Conditions and Complications:     Dimension 4:  Readiness to Change:     Dimension 5:  Relapse, Continued use, or Continued Problem Potential:     Dimension 6:  Recovery/Living Environment:     ASAM Severity Score:    ASAM Recommended Level of Treatment:     Substance use Disorder (SUD)     Recommendations for Services/Supports/Treatments:    DSM5 Diagnoses: Patient Active Problem List   Diagnosis Date Noted  . HAP (hospital-acquired pneumonia)   . Respiratory failure ([place]) 12/28/2019  . Other emphysema ([place])   . Acute pulmonary embolism ([place]) 12/04/2019  . Hypoxemia   . Pressure injury of skin 05/06/2018  . Acute on chronic respiratory failure with hypoxemia ([place])   . Hypercarbia   . GI bleed 05/05/2018  . Hemorrhagic shock ([place]) 05/05/2018  . Acute blood loss anemia 05/05/2018  . Tracheostomy care ([place]) 05/05/2018  . OSA (obstructive sleep apnea) 05/05/2018  . DM2 (diabetes mellitus, type 2) ([place]) 05/05/2018  . Bipolar 1 disorder ([place]) 05/05/2018  . Chronic pain 05/05/2018  . Depression 05/05/2018  . Acute kidney injury ([place]) 05/05/2018  . Acute upper GI bleed   . 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 Morbid obesity ([place]) 02/17/2014  . Chronic respiratory failure with hypoxia and hypercapnia ([place]) 01/23/2014  .  Acute encephalopathy 01/23/2014   Disposition: Per Letitia Libra, NP, patient is psychiatrically cleared and recommended for discharge.  [place], [facility]

## 2020-07-29 NOTE — ED Notes (Signed)
Pt DCd off unit back to facility per provider. Pt alert, cooperative, no s/s of distress. DC information given to P [facility]. Pt off unit on stretcher and transported by PTar

## 2020-07-29 NOTE — ED Triage Notes (Signed)
Pt to room 31. Pt to [facility] by EMS . Pt from [place]. Pt here for psychiatric evaluation. Pt has had SI thoughts.

## 2020-07-29 NOTE — Progress Notes (Signed)
Rt called to ES92 for trach pt. Pt has #[id number] [place]. Pt placed on 60%TC. Pt not having breathing issue at this time. MD at bedside.

## 2020-07-29 NOTE — ED Provider Notes (Signed)
[facility] DEPT Provider Note   CSN: [id number] Arrival date & time: 07/29/20  6948     History Chief Complaint  Patient presents with  . Psychiatric Evaluation    Kim Jenkins is a 54 y.o. female.  Presents to ER with concern for psych eval.  Patient reports that she has felt somewhat depressed about her current status in life.  States that she has been living at [facility] for 8 years and does not enjoy living there.  She reports that she had commented to a staff member that she did not want to live any longer in this current state.  She denies any thoughts of suicide or actually completing any self-harm attempt.  She denies prior history of self-harm, suicide attempt.  Patient is trach dependent, wears trach collar during the day and is on the ventilator at night.  She is nonambulatory, morbidly obese.   She denies any acute medical complaints today.  Per report from transport staff, there was no medical complaints or medical concerns from facility, sent for psych evaluation. HPI     Past Medical History:  Diagnosis Date  . Bipolar 1 disorder ([place])   . Chronic pain   . Depression   . Diabetes mellitus ([place])   . Hypertension   . Kidney stones   . Morbid obesity ([place])   . Obesity hypoventilation syndrome ([place])   . Obstructive sleep apnea   . Panniculitis     Patient Active Problem List   Diagnosis Date Noted  . HAP (hospital-acquired pneumonia)   . Respiratory failure ([place]) 12/28/2019  . Other emphysema ([place])   . Acute pulmonary embolism ([place]) 12/04/2019  . Hypoxemia   . Pressure injury of skin 05/06/2018  . Acute on chronic respiratory failure with hypoxemia ([place])   . Hypercarbia   . GI bleed 05/05/2018  . Hemorrhagic shock ([place]) 05/05/2018  . Acute blood loss anemia 05/05/2018  . Tracheostomy care ([place]) 05/05/2018  . OSA (obstructive sleep apnea) 05/05/2018  . DM2 (diabetes mellitus, type 2) ([place]) 05/05/2018  . Bipolar 1 disorder  ([place]) 05/05/2018  . Chronic pain 05/05/2018  . Depression 05/05/2018  . Acute kidney injury ([place]) 05/05/2018  . Acute upper GI bleed   . Tracheostomy status ([place]) 02/17/2014  . Morbid obesity ([place]) 02/17/2014  . Chronic respiratory failure with hypoxia and hypercapnia ([place]) 01/23/2014  . Acute encephalopathy 01/23/2014    Past Surgical History:  Procedure Laterality Date  . ESOPHAGOGASTRODUODENOSCOPY N/A 05/06/2018   Procedure: ESOPHAGOGASTRODUODENOSCOPY (EGD);  Surgeon: Irving Copas., MD;  Location: [facility];  Service: Gastroenterology;  Laterality: N/A;  . RIGHT OOPHORECTOMY    . TRACHEOSTOMY    . VENTRAL HERNIA REPAIR       OB History   No obstetric history on file.     No family history on file.  Social History   Tobacco Use  . Smoking status: Never Smoker  . Smokeless tobacco: Never Used  Vaping Use  . Vaping Use: Never used  Substance Use Topics  . Alcohol use: No  . Drug use: No    Home Medications Prior to Admission medications   Medication Sig Start Date End Date Taking? Authorizing Provider  ARIPiprazole (ABILIFY) 2 MG tablet Take 2 mg by mouth daily. 07/07/20  Yes [provider]  Cholecalciferol (VITAMIN D-3) 25 MCG (1000 UT) CAPS Take 1,000 Units by mouth daily.   Yes [provider]  dextromethorphan-guaiFENesin (ROBITUSSIN-DM) 10-100 MG/5ML liquid Take 5 mLs by mouth every  6 (six) hours.    Yes [provider]  diazepam (VALIUM) 2 MG tablet Take 2 mg by mouth 2 (two) times daily. 07/07/20  Yes [provider]  divalproex (DEPAKOTE ER) 500 MG 24 hr tablet Take 2 tablets (1,000 mg total) by mouth every 12 (twelve) hours. 09/13/18  Yes Ward, Ozella Almond, PA-C  Exenatide ER (BYDUREON) 2 MG PEN Inject 2 mg into the skin every Friday.    Yes [provider]  famotidine (PEPCID) 20 MG tablet Take 20 mg by mouth daily.    Yes [provider]  fluticasone (FLONASE) 50 MCG/ACT nasal spray Place  2 sprays into both nostrils daily.    Yes [provider]  hydrALAZINE (APRESOLINE) 50 MG tablet Take 50 mg by mouth every 6 (six) hours as needed (SBP > 160 and DPB > 100).   Yes [provider]  insulin glargine (LANTUS SOLOSTAR) 100 UNIT/ML Solostar Pen Inject 20 Units into the skin daily.   Yes [provider]  ipratropium-albuterol (DUONEB) 0.5-2.5 (3) MG/3ML SOLN Inhale 3 mLs into the lungs as needed (for dyspnea).  10/20/19  Yes [provider]  lidocaine (LIDODERM) 5 % Place 1 patch onto the skin daily. Remove & Discard patch within 12 hours or as directed by MD   Yes [provider]  loperamide (IMODIUM A-D) 2 MG tablet Take 2 mg by mouth every 6 (six) hours as needed for diarrhea or loose stools.    Yes [provider]  metoprolol tartrate (LOPRESSOR) 50 MG tablet Take 50 mg by mouth every 12 (twelve) hours.    Yes [provider]  Multiple Vitamin (MULTIVITAMIN WITH MINERALS) TABS tablet Take 1 tablet by mouth daily.   Yes [provider]  nitroGLYCERIN (NITROSTAT) 0.4 MG SL tablet Place 0.4 mg under the tongue every 5 (five) minutes x 3 doses as needed for chest pain (AND CALL PROVIDER).    Yes [provider]  olopatadine (PATANOL) 0.1 % ophthalmic solution Place 1 drop into both eyes 2 (two) times daily.   Yes [provider]  ondansetron (ZOFRAN-ODT) 4 MG disintegrating tablet Take 4 mg by mouth every 6 (six) hours as needed for nausea or vomiting (DISSOLVE ORALLY).   Yes [provider]  potassium chloride SA (KLOR-CON) 20 MEQ tablet Take 20 mEq by mouth daily. 07/07/20  Yes [provider]  Propylene Glycol (SYSTANE BALANCE) 0.6 % SOLN Place 1 drop into both eyes every 6 (six) hours.    Yes [provider]  sodium chloride (OCEAN) 0.65 % SOLN nasal spray Place 2 sprays into both nostrils daily as needed for congestion (or allergic rhinitis).    Yes [provider]    sodium chloride flush (NS) 0.9 % SOLN 10-40 mLs by Intracatheter route as needed (flush). Patient taking differently: Inject 10 mLs into the vein 2 (two) times daily.  12/09/19  Yes Erick Colace, NP  torsemide (DEMADEX) 20 MG tablet Take 20 mg by mouth daily.   Yes [provider]  traZODone (DESYREL) 150 MG tablet Take 1 tablet (150 mg total) by mouth at bedtime. 09/13/18  Yes Ward, Ozella Almond, PA-C  venlafaxine XR (EFFEXOR-XR) 75 MG 24 hr capsule Take 225 mg by mouth daily with breakfast.   Yes [provider]  XTAMPZA ER 9 MG C12A Take 9 mg by mouth 2 (two) times daily. 07/28/20  Yes [provider]  ARIPiprazole (ABILIFY) 5 MG tablet Take 1 tablet (5 mg total)  by mouth daily. Patient not taking: Reported on 07/29/2020 09/13/18   Ward, Ozella Almond, PA-C  Rivaroxaban (XARELTO) 15 MG TABS tablet Take 1 tablet (15 mg total) by mouth 2 (two) times daily. Patient not taking: Reported on 07/29/2020 12/09/19   Erick Colace, NP  rivaroxaban (XARELTO) 20 MG TABS tablet Take 1 tablet (20 mg total) by mouth daily. Patient not taking: Reported on 07/29/2020 12/28/19   Erick Colace, NP    Allergies    Meperidine and related and Cefazolin  Review of Systems   Review of Systems  Constitutional: Negative for chills and fever.  HENT: Negative for ear pain and sore throat.   Eyes: Negative for pain and visual disturbance.  Respiratory: Negative for cough and shortness of breath.   Cardiovascular: Negative for chest pain and palpitations.  Gastrointestinal: Negative for abdominal pain and vomiting.  Genitourinary: Negative for dysuria and hematuria.  Musculoskeletal: Negative for arthralgias and back pain.  Skin: Negative for color change and rash.  Neurological: Negative for seizures and syncope.  Psychiatric/Behavioral: Positive for dysphoric mood. Negative for suicidal ideas.  All other systems reviewed and are negative.   Physical Exam Updated Vital  Signs BP (!) 116/59 (BP Location: Left Wrist)   Pulse (!) 101   Temp 97.9 F (36.6 C) (Axillary)   Resp 18   SpO2 92%   Physical Exam Vitals and nursing note reviewed.  Constitutional:      Appearance: She is well-developed.     Comments: Morbidly obese, no acute distress  HENT:     Head: Normocephalic.     Comments: Trach site intact, trach collar intact Eyes:     Conjunctiva/sclera: Conjunctivae normal.  Cardiovascular:     Rate and Rhythm: Normal rate and regular rhythm.     Heart sounds: No murmur heard.   Pulmonary:     Effort: Pulmonary effort is normal. No respiratory distress.     Breath sounds: Normal breath sounds.     Comments: Mild end expiratory wheeze, trach collar intact, no respiratory distress Abdominal:     Palpations: Abdomen is soft.     Tenderness: There is no abdominal tenderness.  Musculoskeletal:        General: No deformity or signs of injury.     Cervical back: Neck supple.  Skin:    General: Skin is warm and dry.  Neurological:     Mental Status: She is alert.  Psychiatric:        Mood and Affect: Mood normal.        Thought Content: Thought content normal.        Judgment: Judgment normal.     ED Results / Procedures / Treatments   Labs (all labs ordered are listed, but only abnormal results are displayed) Labs Reviewed  CBC WITH DIFFERENTIAL/PLATELET - Abnormal; Notable for the following components:      Result Value   Hemoglobin 10.8 (*)    MCHC 29.8 (*)    All other components within normal limits  COMPREHENSIVE METABOLIC PANEL - Abnormal; Notable for the following components:   Chloride 89 (*)    CO2 37 (*)    Glucose, Bld 123 (*)    Albumin 3.1 (*)    AST 14 (*)    All other components within normal limits  RAPID URINE DRUG SCREEN, HOSP PERFORMED    EKG None  Radiology No results found.  Procedures Procedures (including critical care time)  Medications Ordered in ED Medications  oxyCODONE-acetaminophen  (  PERCOCET/ROXICET) 5-325 MG per tablet 1 tablet (1 tablet Oral Given 07/29/20 1028)    ED Course  I have reviewed the triage vital signs and the nursing notes.  Pertinent labs & imaging results that were available during my care of the patient were reviewed by me and considered in my medical decision making (see chart for details).  Clinical Course as of Jul 29 1542  Thu Jul 29, 2020  1332 Psych cleared   [RD]  1332 Labs stable, patient remains well appearing   [RD]    Clinical Course User Index [RD] Lucrezia Starch, MD   MDM Rules/Calculators/A&P                          54 year old lady who is trach dependent from [facility] presents to ER after having SI thoughts.  These ideations seem relatively passive, no ongoing SI in ER, no plan, no action of self-harm.  She has no acute medical complaints.  Based on her exam, she appears well what I suspect is her baseline.  Basic labs were within normal limits.  Psychiatry evaluated and cleared patient to receive outpatient resources.  No inpatient criteria. Will go back to [facility].   After the discussed management above, the patient was determined to be safe for discharge.  The patient was in agreement with this plan and all questions regarding their care were answered.  ED return precautions were discussed and the patient will return to the ED with any significant worsening of condition.      Final Clinical Impression(s) / ED Diagnoses Final diagnoses:  Depression, unspecified depression type    Rx / DC Orders ED Discharge Orders    None       Lucrezia Starch, MD 07/29/20 [phone]

## 2020-07-29 NOTE — BH Assessment (Signed)
[facility] Assessment Progress Note  Per Letitia Libra, NP, this voluntary pt does not require psychiatric hospitalization at this time.  Pt is psychiatrically cleared.  Pt would benefit from outpatient virtual psychiatry and/or therapy.  At 13:04 this writer called [facility], from which pt came, and spoke to [place] [phone]).  She reports that pt does not currently receive these services, but that arrangements could be made for virtual services.  Discharge instructions include referral information for several area providers that currently offer virtual services.  EDP Madalyn Rob, MD and pt's nurse, Eustaquio Maize, have been notified.  Jalene Mullet, [facility] Triage Specialist [phone]

## 2020-07-29 NOTE — Discharge Instructions (Signed)
For your behavioral health needs, you are advised to follow up with an outpatient provider.  The following providers offer psychiatry/medication management, as well as therapy.  They currently offer virtual services.  Contact them at your earliest opportunity to ask about scheduling an intake appointment:       [facility]. [address] [place], [place] [address]      [phone]       [facility]      [address], [place] [address]      [phone]       [facility]      [address]       [place], [place] [address]      [phone]

## 2020-07-29 NOTE — BHH Counselor (Signed)
Wilmon Arms, RN, notified through secure chat that per Letitia Libra, NP, patient is psychiatrically cleared and recommended for discharge.

## 2021-02-26 ENCOUNTER — Emergency Department (HOSPITAL_COMMUNITY)
Admission: EM | Admit: 2021-02-26 | Discharge: 2021-02-26 | Disposition: A | Discharge status: Home / Self Care | Payer: Medicare Other | Attending: Emergency Medicine | Admitting: Emergency Medicine

## 2021-02-26 ENCOUNTER — Other Ambulatory Visit: Payer: Self-pay

## 2021-02-26 ENCOUNTER — Emergency Department (HOSPITAL_COMMUNITY): Payer: Medicare Other

## 2021-02-26 ENCOUNTER — Encounter (HOSPITAL_COMMUNITY): Payer: Self-pay | Admitting: Pharmacy Technician

## 2021-02-26 DIAGNOSIS — Z794 Long term (current) use of insulin: Secondary | ICD-10-CM | POA: Insufficient documentation

## 2021-02-26 DIAGNOSIS — Z79899 Other long term (current) drug therapy: Secondary | ICD-10-CM | POA: Insufficient documentation

## 2021-02-26 DIAGNOSIS — Z7901 Long term (current) use of anticoagulants: Secondary | ICD-10-CM | POA: Diagnosis not present

## 2021-02-26 DIAGNOSIS — I499 Cardiac arrhythmia, unspecified: Secondary | ICD-10-CM | POA: Diagnosis not present

## 2021-02-26 DIAGNOSIS — E119 Type 2 diabetes mellitus without complications: Secondary | ICD-10-CM | POA: Insufficient documentation

## 2021-02-26 DIAGNOSIS — I10 Essential (primary) hypertension: Secondary | ICD-10-CM | POA: Insufficient documentation

## 2021-02-26 DIAGNOSIS — R4182 Altered mental status, unspecified: Secondary | ICD-10-CM | POA: Diagnosis not present

## 2021-02-26 LAB — URINALYSIS, ROUTINE W REFLEX MICROSCOPIC
Bacteria, UA: NONE SEEN
Bilirubin Urine: NEGATIVE
Glucose, UA: NEGATIVE mg/dL
Hgb urine dipstick: NEGATIVE
Ketones, ur: 5 mg/dL — AB
Leukocytes,Ua: NEGATIVE
Nitrite: NEGATIVE
Protein, ur: 30 mg/dL — AB
Specific Gravity, Urine: 1.024 (ref 1.005–1.030)
pH: 6 (ref 5.0–8.0)

## 2021-02-26 LAB — COMPREHENSIVE METABOLIC PANEL
ALT: 15 U/L (ref 0–44)
AST: 21 U/L (ref 15–41)
Albumin: 2.6 g/dL — ABNORMAL LOW (ref 3.5–5.0)
Alkaline Phosphatase: 38 U/L (ref 38–126)
Anion gap: 9 (ref 5–15)
BUN: 10 mg/dL (ref 6–20)
CO2: 35 mmol/L — ABNORMAL HIGH (ref 22–32)
Calcium: 9.1 mg/dL (ref 8.9–10.3)
Chloride: 92 mmol/L — ABNORMAL LOW (ref 98–111)
Creatinine, Ser: 0.6 mg/dL (ref 0.44–1.00)
GFR, Estimated: >60 mL/min (ref >60)
Glucose, Bld: 149 mg/dL — ABNORMAL HIGH (ref 70–99)
Potassium: 3.9 mmol/L (ref 3.5–5.1)
Sodium: 136 mmol/L (ref 135–145)
Total Bilirubin: 0.3 mg/dL (ref 0.3–1.2)
Total Protein: 6.8 g/dL (ref 6.5–8.1)

## 2021-02-26 LAB — CBC WITH DIFFERENTIAL/PLATELET
Abs Immature Granulocytes: 0.03 10*3/uL (ref 0.00–0.07)
Basophils Absolute: 0 10*3/uL (ref 0.0–0.1)
Basophils Relative: 0 %
Eosinophils Absolute: 0.1 10*3/uL (ref 0.0–0.5)
Eosinophils Relative: 1 %
HCT: 34 % — ABNORMAL LOW (ref 36.0–46.0)
Hemoglobin: 10.2 g/dL — ABNORMAL LOW (ref 12.0–15.0)
Immature Granulocytes: 0 %
Lymphocytes Relative: 19 %
Lymphs Abs: 1.6 10*3/uL (ref 0.7–4.0)
MCH: 28.3 pg (ref 26.0–34.0)
MCHC: 30 g/dL (ref 30.0–36.0)
MCV: 94.2 fL (ref 80.0–100.0)
Monocytes Absolute: 0.8 10*3/uL (ref 0.1–1.0)
Monocytes Relative: 9 %
Neutro Abs: 5.8 10*3/uL (ref 1.7–7.7)
Neutrophils Relative %: 71 %
Platelets: 152 10*3/uL (ref 150–400)
RBC: 3.61 MIL/uL — ABNORMAL LOW (ref 3.87–5.11)
RDW: 13.9 % (ref 11.5–15.5)
WBC: 8.3 10*3/uL (ref 4.0–10.5)
nRBC: 0 % (ref 0.0–0.2)

## 2021-02-26 LAB — I-STAT ARTERIAL BLOOD GAS, ED
Acid-Base Excess: 14 mmol/L — ABNORMAL HIGH (ref 0.0–2.0)
Bicarbonate: 40.1 mmol/L — ABNORMAL HIGH (ref 20.0–28.0)
Calcium, Ion: 1.18 mmol/L (ref 1.15–1.40)
HCT: 30 % — ABNORMAL LOW (ref 36.0–46.0)
Hemoglobin: 10.2 g/dL — ABNORMAL LOW (ref 12.0–15.0)
O2 Saturation: 98 %
Patient temperature: 98.8
Potassium: 4.3 mmol/L (ref 3.5–5.1)
Sodium: 137 mmol/L (ref 135–145)
TCO2: 42 mmol/L — ABNORMAL HIGH (ref 22–32)
pCO2 arterial: 59.4 mmHg — ABNORMAL HIGH (ref 32.0–48.0)
pH, Arterial: 7.437 (ref 7.350–7.450)
pO2, Arterial: 98 mmHg (ref 83.0–108.0)

## 2021-02-26 LAB — CBG MONITORING, ED: Glucose-Capillary: 142 mg/dL — ABNORMAL HIGH (ref 70–99)

## 2021-02-26 LAB — RAPID URINE DRUG SCREEN, HOSP PERFORMED
Amphetamines: NOT DETECTED
Barbiturates: NOT DETECTED
Benzodiazepines: POSITIVE — AB
Cocaine: NOT DETECTED
Opiates: NOT DETECTED
Tetrahydrocannabinol: NOT DETECTED

## 2021-02-26 LAB — TROPONIN I (HIGH SENSITIVITY)
Troponin I (High Sensitivity): 8 ng/L (ref <18)
Troponin I (High Sensitivity): 17 ng/L (ref <18)

## 2021-02-26 LAB — ETHANOL: Alcohol, Ethyl (B): <10 mg/dL (ref <10)

## 2021-02-26 MED ORDER — SODIUM CHLORIDE 0.9 % IV BOLUS
500.0000 mL | Freq: Once | INTRAVENOUS | Status: AC
  Administered 2021-02-26: 500 mL via INTRAVENOUS

## 2021-02-26 NOTE — ED Provider Notes (Signed)
[facility] EMERGENCY DEPARTMENT Provider Note   CSN: [id number] Arrival date & time: 02/26/21  4098     History No chief complaint on file.   Kim Jenkins is a 55 y.o. female.  HPI LEVEL 5 caveat, patient on vent and is difficult to understand   55 year old female with a history of bipolar 1 disorder, DM type II, hypertension, obesity hypoventilation syndrome who is chronically trach dependent, wearing the trach collar during the day and the ventilator at night.  She is nonambulatory at baseline.  Presents from her facility at [facility] with reported "hypertensive crisis", with a blood pressure of 207/117, seem to be more lethargic and more difficult to arouse this morning.  She did not take any of her blood pressure medicines due to her mental state.  She has been compliant with all medicines leading up until this point.  Nursing staff at [place] reports history of hypertensive crises in the past.   On my evaluation, pt is alert and oriented x4, answering questions appropriately. Complains of intermittent chest pain but this has been ongoing for quite sometime.  She does report taking pain medicines (per chart review appears to be Xtampza ER) and thinks that her episode was due to the pain medicine    Past Medical History:  Diagnosis Date   Bipolar 1 disorder ([place])    Chronic pain    Depression    Diabetes mellitus ([place])    Hypertension    Kidney stones    Morbid obesity ([place])    Obesity hypoventilation syndrome ([place])    Obstructive sleep apnea    Panniculitis     Patient Active Problem List   Diagnosis Date Noted   HAP (hospital-acquired pneumonia)    Respiratory failure ([place]) 12/28/2019   Other emphysema ([place])    Acute pulmonary embolism ([place]) 12/04/2019   Hypoxemia    Pressure injury of skin 05/06/2018   Acute on chronic respiratory failure with hypoxemia (HCC)    Hypercarbia    GI bleed 05/05/2018   Hemorrhagic shock ([place]) 05/05/2018   Acute  blood loss anemia 05/05/2018   Tracheostomy care ([place]) 05/05/2018   OSA (obstructive sleep apnea) 05/05/2018   DM2 (diabetes mellitus, type 2) ([place]) 05/05/2018   Bipolar 1 disorder ([place]) 05/05/2018   Chronic pain 05/05/2018   Depression 05/05/2018   Acute kidney injury ([place]) 05/05/2018   Acute upper GI bleed    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     No family history on file.  Social History   Tobacco Use   Smoking status: Never   Smokeless tobacco: Never  Vaping Use   Vaping Use: Never used  Substance Use Topics   Alcohol use: No   Drug use: No    Home Medications Prior to Admission medications   Medication Sig Start Date End Date Taking? Authorizing Provider  ARIPiprazole (ABILIFY) 2 MG tablet Take 2 mg by mouth daily. 07/07/20   [provider]  ARIPiprazole (ABILIFY) 5  MG tablet Take 1 tablet (5 mg total) by mouth daily. Patient not taking: Reported on 07/29/2020 09/13/18   Ward, Ozella Almond, PA-C  Cholecalciferol (VITAMIN D-3) 25 MCG (1000 UT) CAPS Take 1,000 Units by mouth daily.    [provider]  dextromethorphan-guaiFENesin (ROBITUSSIN-DM) 10-100 MG/5ML liquid Take 5 mLs by mouth every 6 (six) hours.     [provider]  diazepam (VALIUM) 2 MG tablet Take 2 mg by mouth 2 (two) times daily. 07/07/20   [provider]  divalproex (DEPAKOTE ER) 500 MG 24 hr tablet Take 2 tablets (1,000 mg total) by mouth every 12  (twelve) hours. 09/13/18   Ward, Ozella Almond, PA-C  Exenatide ER (BYDUREON) 2 MG PEN Inject 2 mg into the skin every Friday.     [provider]  famotidine (PEPCID) 20 MG tablet Take 20 mg by mouth daily.     [provider]  fluticasone (FLONASE) 50 MCG/ACT nasal spray Place 2 sprays into both nostrils daily.     [provider]  hydrALAZINE (APRESOLINE) 50 MG tablet Take 50 mg by mouth every 6 (six) hours as needed (SBP > 160 and DPB > 100).    [provider]  insulin glargine (LANTUS SOLOSTAR) 100 UNIT/ML Solostar Pen Inject 20 Units into the skin daily.    [provider]  ipratropium-albuterol (DUONEB) 0.5-2.5 (3) MG/3ML SOLN Inhale 3 mLs into the lungs as needed (for dyspnea).  10/20/19   [provider]  lidocaine (LIDODERM) 5 % Place 1 patch onto the skin daily. Remove & Discard patch within 12 hours or as directed by MD    [provider]  loperamide (IMODIUM A-D) 2 MG tablet Take 2 mg by mouth every 6 (six) hours as needed for diarrhea or loose stools.     [provider]  metoprolol tartrate (LOPRESSOR) 50 MG tablet Take 50 mg by mouth every 12 (twelve) hours.     [provider]  Multiple Vitamin (MULTIVITAMIN WITH MINERALS) TABS tablet Take 1 tablet by mouth daily.    [provider]  nitroGLYCERIN (NITROSTAT) 0.4 MG SL tablet Place 0.4 mg under the tongue every 5 (five) minutes x 3 doses as needed for chest pain (AND CALL PROVIDER).     [provider]  olopatadine (PATANOL) 0.1 % ophthalmic solution Place 1 drop into both eyes 2 (two) times daily.    [provider]  ondansetron (ZOFRAN-ODT) 4 MG disintegrating tablet Take 4 mg by mouth every 6 (six) hours as needed for nausea or vomiting (DISSOLVE ORALLY).    [provider]  potassium chloride SA (KLOR-CON) 20 MEQ tablet Take 20 mEq by mouth daily. 07/07/20   [provider]  Propylene Glycol (SYSTANE  BALANCE) 0.6 % SOLN Place 1 drop into both eyes every 6 (six) hours.     [provider]  Rivaroxaban (XARELTO) 15 MG TABS tablet Take 1 tablet (15 mg total) by mouth 2 (two) times daily. Patient not taking: Reported on 07/29/2020 12/09/19   Erick Colace, NP  rivaroxaban (XARELTO) 20 MG TABS tablet Take 1 tablet (20 mg total) by mouth daily. Patient not taking: Reported on 07/29/2020 12/28/19   Erick Colace, NP  sodium chloride (OCEAN) 0.65 % SOLN nasal spray Place 2 sprays into both nostrils daily as needed for congestion (or allergic rhinitis).     [provider]  sodium chloride flush (NS) 0.9 % SOLN 10-40 mLs by Intracatheter route as needed (flush). Patient  taking differently: Inject 10 mLs into the vein 2 (two) times daily.  12/09/19   Erick Colace, NP  torsemide (DEMADEX) 20 MG tablet Take 20 mg by mouth daily.    [provider]  traZODone (DESYREL) 150 MG tablet Take 1 tablet (150 mg total) by mouth at bedtime. 09/13/18   Ward, Ozella Almond, PA-C  venlafaxine XR (EFFEXOR-XR) 75 MG 24 hr capsule Take 225 mg by mouth daily with breakfast.    [provider]  XTAMPZA ER 9 MG C12A Take 9 mg by mouth 2 (two) times daily. 07/28/20   [provider]    Allergies    Meperidine and related and Cefazolin  Review of Systems   Review of Systems  Constitutional:  Negative for chills and fever.  HENT:  Negative for ear pain and sore throat.   Eyes:  Negative for pain and visual disturbance.  Respiratory:  Negative for cough and shortness of breath.   Cardiovascular:  Negative for chest pain and palpitations.  Gastrointestinal:  Negative for abdominal pain and vomiting.  Genitourinary:  Negative for dysuria and hematuria.  Musculoskeletal:  Negative for arthralgias and back pain.  Skin:  Negative for color change and rash.  Neurological:  Negative for seizures and syncope.  Psychiatric/Behavioral:  Positive for confusion.   All other  systems reviewed and are negative.  Physical Exam Updated Vital Signs BP (!) 87/55   Pulse 67   Temp 98.8 F (37.1 C) (Axillary)   Resp 17   Ht 5\' 6"  (1.676 m)   SpO2 100%   BMI 55.37 kg/m   Physical Exam Vitals and nursing note reviewed.  Constitutional:      General: She is not in acute distress.    Appearance: She is well-developed. She is obese. She is not ill-appearing or diaphoretic.  HENT:     Head: Normocephalic and atraumatic.  Eyes:     Conjunctiva/sclera: Conjunctivae normal.  Cardiovascular:     Rate and Rhythm: Normal rate and regular rhythm.     Heart sounds: No murmur heard. Pulmonary:     Effort: Pulmonary effort is normal. No respiratory distress.     Breath sounds: Normal breath sounds.  Abdominal:     Palpations: Abdomen is soft.     Tenderness: There is no abdominal tenderness.  Musculoskeletal:     Cervical back: Neck supple.  Skin:    General: Skin is warm and dry.  Neurological:     General: No focal deficit present.     Mental Status: She is alert and oriented to person, place, and time.     Cranial Nerves: No cranial nerve deficit.     Sensory: No sensory deficit.     Motor: No weakness.  Psychiatric:        Mood and Affect: Mood normal.        Behavior: Behavior normal.    ED Results / Procedures / Treatments   Labs (all labs ordered are listed, but only abnormal results are displayed) Labs Reviewed  CBC WITH DIFFERENTIAL/PLATELET - Abnormal; Notable for the following components:      Result Value   RBC 3.61 (*)    Hemoglobin 10.2 (*)    HCT 34.0 (*)    All other components within normal limits  COMPREHENSIVE METABOLIC PANEL - Abnormal; Notable for the following components:   Chloride 92 (*)    CO2 35 (*)    Glucose, Bld 149 (*)    Albumin 2.6 (*)  All other components within normal limits  URINALYSIS, ROUTINE W REFLEX MICROSCOPIC - Abnormal; Notable for the following components:   Color, Urine AMBER (*)    APPearance HAZY  (*)    Ketones, ur 5 (*)    Protein, ur 30 (*)    All other components within normal limits  I-STAT ARTERIAL BLOOD GAS, ED - Abnormal; Notable for the following components:   pCO2 arterial 59.4 (*)    Bicarbonate 40.1 (*)    TCO2 42 (*)    Acid-Base Excess 14.0 (*)    HCT 30.0 (*)    Hemoglobin 10.2 (*)    All other components within normal limits  CBG MONITORING, ED - Abnormal; Notable for the following components:   Glucose-Capillary 142 (*)    All other components within normal limits  ETHANOL  RAPID URINE DRUG SCREEN, HOSP PERFORMED  TROPONIN I (HIGH SENSITIVITY)  TROPONIN I (HIGH SENSITIVITY)    EKG None  Radiology DG Abdomen 1 View  Result Date: 02/26/2021 CLINICAL DATA:  Abdominal pain. EXAM: ABDOMEN - 1 VIEW COMPARISON:  02/18/2014 FINDINGS: Bowel gas pattern demonstrates air throughout the colon. There are several air-filled nondilated small bowel loops over the right abdomen. No free peritoneal air. Moderate fecal retention over the right colon. Surgical clips over the pelvis. Diffuse osteopenia. Mild degenerate change of the spine and hips. IMPRESSION: Nonspecific, nonobstructive bowel gas pattern with moderate fecal retention over the right colon. Electronically Signed   By: Marin Olp M.D.   On: 02/26/2021 10:29   CT Head Wo Contrast  Result Date: 02/26/2021 CLINICAL DATA:  Confusion and delirium for 3 days. EXAM: CT HEAD WITHOUT CONTRAST TECHNIQUE: Contiguous axial images were obtained from the base of the skull through the vertex without intravenous contrast. COMPARISON:  01/12/2008, report only. FINDINGS: Brain: Cerebral atrophy for age. Mild low density in the periventricular white matter likely related to small vessel disease. No mass lesion, hemorrhage, hydrocephalus, acute infarct, intra-axial, or extra-axial fluid collection. Vascular: No hyperdense vessel or unexpected calcification. Skull: Normal Sinuses/Orbits: Normal imaged portions of the orbits and globes.  Clear paranasal sinuses and mastoid air cells. Other: None. IMPRESSION: 1. No acute intracranial abnormality. 2. Cerebral atrophy and small vessel ischemic change. Electronically Signed   By: Abigail Miyamoto M.D.   On: 02/26/2021 10:28   DG Chest Portable 1 View  Result Date: 02/26/2021 CLINICAL DATA:  55 year old female with altered mental status and shortness of breath with chronic respiratory failure, tracheostomy dependent EXAM: PORTABLE CHEST 1 VIEW COMPARISON:  Prior chest x-ray 12/28/2019 FINDINGS: Tracheostomy tube in good position with the tip midline and at the level of the clavicles. Stable cardiac and mediastinal contours. The lungs are hyperinflated with chronic bronchitic changes. Mild vascular congestion without overt edema. No focal airspace infiltrate, pleural effusion or pneumothorax. No acute osseous abnormality. The right upper extremity PICC has been removed since the prior study. IMPRESSION: Stable chest x-ray without evidence of acute cardiopulmonary process. Electronically Signed   By: Jacqulynn Cadet M.D.   On: 02/26/2021 10:11    Procedures Procedures   Medications Ordered in ED Medications  sodium chloride 0.9 % bolus 500 mL (0 mLs Intravenous Stopped 02/26/21 1139)    ED Course  I have reviewed the triage vital signs and the nursing notes.  Pertinent labs & imaging results that were available during my care of the patient were reviewed by me and considered in my medical decision making (see chart for details).  Clinical Course as of 02/26/21  1353  Sat Feb 27, 7360  184 55 year old female with reported altered mental status, episode of hypertension with a blood pressure 207/117 per her facility.  On arrival, she is well-appearing, answering questions appropriately, known to x4, with no focal neurodeficits.  Blood pressures are soft at 100/62, however per her facility she does tend to have lower blood pressures.  Physical exam overall reassuring, no significant abdominal  tenderness, lung sounds clear.  Plan for altered mental status work-up including labs, CT of the head, chest x-ray, ABG, glucose, UA. [MB]  1002 pCO2 arterial(!): 59.4 [MB]  1043 CT Head Wo Contrast CT of the head without any acute changes, and discussed findings with radiology, small vessel ischemic changes appear chronic and not acute. [MB]  3903 UA with some ketones and proteinuria likely suggestive of dehydration.  No evidence of UTI. [MB]  1350 I did  speak with the staff at [facility], patient reportedly does have low blood blood pressures that run in the 00P and 23R systolic.  Again low suspicion for sepsis or other cardiogenic source.  She is overall very well-appearing, appears to be at baseline mental status.  Stable for discharge. [MB]  [place] of the patient discussed with Dr. Jeanell Sparrow who is agreeable to the above plan and disposition  [MB]    Clinical Course User Index [MB] Lyndel Safe   MDM Rules/Calculators/A&P                           Final Clinical Impression(s) / ED Diagnoses Final diagnoses:  Altered mental status, unspecified altered mental status type    Rx / DC Orders ED Discharge Orders     None        Lyndel Safe 02/26/21 1353    Pattricia Boss, MD 02/26/21 1520

## 2021-02-26 NOTE — Discharge Instructions (Addendum)
You were evaluated in the Emergency Department and after careful evaluation, we did not find any emergent condition requiring admission or further testing in the hospital. ° °Please return to the Emergency Department if you experience any worsening of your condition.  We encourage you to follow up with a primary care provider.  Thank you for allowing us to be a part of your care. °

## 2021-02-26 NOTE — ED Triage Notes (Signed)
Pt from [facility] room 312, [facility] with reports of being unresponsive this morning. Per staff, pt dx with PNA a few weeks ago. Since then has developed diarrhea. Staff has noted pt being more somnolent. Today pt was not responding and hypertensive in the 200's at [facility]. [facility] reports BP 110/70. Per [facility], pt normally on trach collar and vent at bedtime. Pt with intermittent chest pain. EKG unremarkable.

## 2021-02-26 NOTE — ED Notes (Signed)
Patient transported to CT 

## 2021-02-26 NOTE — ED Notes (Signed)
Urine culture sent to lab with UA.

## 2021-02-26 NOTE — Progress Notes (Signed)
Patient transported on vent to CT and returned to ED 803 without complications.

## 2022-07-26 ENCOUNTER — Other Ambulatory Visit (HOSPITAL_COMMUNITY): Payer: Self-pay | Admitting: Internal Medicine

## 2022-07-26 DIAGNOSIS — R4182 Altered mental status, unspecified: Secondary | ICD-10-CM

## 2022-07-30 ENCOUNTER — Emergency Department (HOSPITAL_COMMUNITY): Payer: Medicare Other

## 2022-07-30 ENCOUNTER — Emergency Department (HOSPITAL_COMMUNITY)
Admission: EM | Admit: 2022-07-30 | Discharge: 2022-07-31 | Disposition: A | Discharge status: Skilled Nursing Facility | Payer: Medicare Other | Attending: Emergency Medicine | Admitting: Emergency Medicine

## 2022-07-30 ENCOUNTER — Other Ambulatory Visit: Payer: Self-pay

## 2022-07-30 ENCOUNTER — Other Ambulatory Visit (HOSPITAL_COMMUNITY): Payer: Medicare Other

## 2022-07-30 DIAGNOSIS — I5032 Chronic diastolic (congestive) heart failure: Secondary | ICD-10-CM | POA: Diagnosis not present

## 2022-07-30 DIAGNOSIS — R4182 Altered mental status, unspecified: Secondary | ICD-10-CM

## 2022-07-30 DIAGNOSIS — J189 Pneumonia, unspecified organism: Secondary | ICD-10-CM

## 2022-07-30 DIAGNOSIS — Z79899 Other long term (current) drug therapy: Secondary | ICD-10-CM | POA: Insufficient documentation

## 2022-07-30 DIAGNOSIS — Y95 Nosocomial condition: Secondary | ICD-10-CM

## 2022-07-30 LAB — CBC WITH DIFFERENTIAL/PLATELET
Abs Immature Granulocytes: 0.01 10*3/uL (ref 0.00–0.07)
Basophils Absolute: 0 10*3/uL (ref 0.0–0.1)
Basophils Relative: 0 %
Eosinophils Absolute: 0.1 10*3/uL (ref 0.0–0.5)
Eosinophils Relative: 1 %
HCT: 30.9 % — ABNORMAL LOW (ref 36.0–46.0)
Hemoglobin: 9.3 g/dL — ABNORMAL LOW (ref 12.0–15.0)
Immature Granulocytes: 0 %
Lymphocytes Relative: 25 %
Lymphs Abs: 1.6 10*3/uL (ref 0.7–4.0)
MCH: 27 pg (ref 26.0–34.0)
MCHC: 30.1 g/dL (ref 30.0–36.0)
MCV: 89.8 fL (ref 80.0–100.0)
Monocytes Absolute: 0.7 10*3/uL (ref 0.1–1.0)
Monocytes Relative: 10 %
Neutro Abs: 4 10*3/uL (ref 1.7–7.7)
Neutrophils Relative %: 64 %
Platelets: 184 10*3/uL (ref 150–400)
RBC: 3.44 MIL/uL — ABNORMAL LOW (ref 3.87–5.11)
RDW: 16.6 % — ABNORMAL HIGH (ref 11.5–15.5)
WBC: 6.3 10*3/uL (ref 4.0–10.5)
nRBC: 0 % (ref 0.0–0.2)

## 2022-07-30 LAB — I-STAT ARTERIAL BLOOD GAS, ED
Acid-Base Excess: 12 mmol/L — ABNORMAL HIGH (ref 0.0–2.0)
Bicarbonate: 36.8 mmol/L — ABNORMAL HIGH (ref 20.0–28.0)
Calcium, Ion: 1.12 mmol/L — ABNORMAL LOW (ref 1.15–1.40)
HCT: 27 % — ABNORMAL LOW (ref 36.0–46.0)
Hemoglobin: 9.2 g/dL — ABNORMAL LOW (ref 12.0–15.0)
O2 Saturation: 100 %
Patient temperature: 99.1
Potassium: 4.3 mmol/L (ref 3.5–5.1)
Sodium: 137 mmol/L (ref 135–145)
TCO2: 38 mmol/L — ABNORMAL HIGH (ref 22–32)
pCO2 arterial: 46.4 mmHg (ref 32–48)
pH, Arterial: 7.508 — ABNORMAL HIGH (ref 7.35–7.45)
pO2, Arterial: 163 mmHg — ABNORMAL HIGH (ref 83–108)

## 2022-07-30 LAB — COMPREHENSIVE METABOLIC PANEL
ALT: 10 U/L (ref 0–44)
AST: 13 U/L — ABNORMAL LOW (ref 15–41)
Albumin: 2.3 g/dL — ABNORMAL LOW (ref 3.5–5.0)
Alkaline Phosphatase: 50 U/L (ref 38–126)
Anion gap: 13 (ref 5–15)
BUN: 31 mg/dL — ABNORMAL HIGH (ref 6–20)
CO2: 32 mmol/L (ref 22–32)
Calcium: 8.5 mg/dL — ABNORMAL LOW (ref 8.9–10.3)
Chloride: 95 mmol/L — ABNORMAL LOW (ref 98–111)
Creatinine, Ser: 1.65 mg/dL — ABNORMAL HIGH (ref 0.44–1.00)
GFR, Estimated: 36 mL/min — ABNORMAL LOW (ref >60)
Glucose, Bld: 152 mg/dL — ABNORMAL HIGH (ref 70–99)
Potassium: 4.4 mmol/L (ref 3.5–5.1)
Sodium: 140 mmol/L (ref 135–145)
Total Bilirubin: 0.3 mg/dL (ref 0.3–1.2)
Total Protein: 7.1 g/dL (ref 6.5–8.1)

## 2022-07-30 LAB — LACTIC ACID, PLASMA: Lactic Acid, Venous: 1.6 mmol/L (ref 0.5–1.9)

## 2022-07-30 MED ORDER — SODIUM CHLORIDE 0.9 % IV SOLN
2.0000 g | Freq: Once | INTRAVENOUS | Status: AC
  Administered 2022-07-31: 2 g via INTRAVENOUS
  Filled 2022-07-30: qty 10

## 2022-07-30 MED ORDER — VANCOMYCIN HCL IN DEXTROSE 1-5 GM/200ML-% IV SOLN
1000.0000 mg | Freq: Once | INTRAVENOUS | Status: AC
  Administered 2022-07-31: 1000 mg via INTRAVENOUS
  Filled 2022-07-30: qty 200

## 2022-07-30 MED ORDER — VANCOMYCIN HCL 1250 MG/250ML IV SOLN: 1250.0000 mg | INTRAVENOUS | Status: DC

## 2022-07-30 NOTE — ED Provider Notes (Addendum)
Kim Jenkins Provider Note   CSN: [id number] Arrival date & time: 07/30/22  1709     History  Chief Complaint  Patient presents with   Altered Mental Status    Kim Jenkins is a 56 y.o. female.  No direct communication but EMS stated that [facility] sent patient here to have CT head and get a chest x-ray.  For a change in her mental status for the last month.  Patient here tells [place] that she is from [place].  Says she has no significant complaints.  Patient is on a chronic ventilator which is typical of their patients.  Patient has a history of acute on chronic respiratory failure vent dependent.  History of chronic diastolic congestive heart failure as well.  Blood pressures since arrival here have been 0076A systolic.  Not sure if that is baseline for her or not oxygen saturations been 100% she is not tachycardic heart rate 72 temp was 98.4.  No fever.       Home Medications Prior to Admission medications   Medication Sig Start Date End Date Taking? Authorizing Provider  ARIPiprazole (ABILIFY) 2 MG tablet Take 2 mg by mouth daily. 07/07/20   [provider]  ARIPiprazole (ABILIFY) 5 MG tablet Take 1 tablet (5 mg total) by mouth daily. Patient not taking: Reported on 07/29/2020 09/13/18   Ward, Ozella Almond, PA-C  Cholecalciferol (VITAMIN D-3) 25 MCG (1000 UT) CAPS Take 1,000 Units by mouth daily.    [provider]  dextromethorphan-guaiFENesin (ROBITUSSIN-DM) 10-100 MG/5ML liquid Take 5 mLs by mouth every 6 (six) hours.     [provider]  diazepam (VALIUM) 2 MG tablet Take 2 mg by mouth 2 (two) times daily. 07/07/20   [provider]  divalproex (DEPAKOTE ER) 500 MG 24 hr tablet Take 2 tablets (1,000 mg total) by mouth every 12 (twelve) hours. 09/13/18   Ward, Ozella Almond, PA-C  Exenatide ER (BYDUREON) 2 MG PEN Inject 2 mg into the skin every Friday.     [provider]  famotidine (PEPCID) 20 MG  tablet Take 20 mg by mouth daily.     [provider]  fluticasone (FLONASE) 50 MCG/ACT nasal spray Place 2 sprays into both nostrils daily.     [provider]  hydrALAZINE (APRESOLINE) 50 MG tablet Take 50 mg by mouth every 6 (six) hours as needed (SBP > 160 and DPB > 100).    [provider]  insulin glargine (LANTUS SOLOSTAR) 100 UNIT/ML Solostar Pen Inject 20 Units into the skin daily.    [provider]  ipratropium-albuterol (DUONEB) 0.5-2.5 (3) MG/3ML SOLN Inhale 3 mLs into the lungs as needed (for dyspnea).  10/20/19   [provider]  lidocaine (LIDODERM) 5 % Place 1 patch onto the skin daily. Remove & Discard patch within 12 hours or as directed by MD    [provider]  loperamide (IMODIUM A-D) 2 MG tablet Take 2 mg by mouth every 6 (six) hours as needed for diarrhea or loose stools.     [provider]  metoprolol tartrate (LOPRESSOR) 50 MG tablet Take 50 mg by mouth every 12 (twelve) hours.     [provider]  Multiple Vitamin (MULTIVITAMIN WITH MINERALS) TABS tablet Take 1 tablet by mouth daily.    [provider]  nitroGLYCERIN (NITROSTAT) 0.4 MG SL tablet Place 0.4 mg under the tongue every 5 (five) minutes x 3 doses as needed for chest pain (AND CALL  PROVIDER).     [provider]  olopatadine (PATANOL) 0.1 % ophthalmic solution Place 1 drop into both eyes 2 (two) times daily.    [provider]  ondansetron (ZOFRAN-ODT) 4 MG disintegrating tablet Take 4 mg by mouth every 6 (six) hours as needed for nausea or vomiting (DISSOLVE ORALLY).    [provider]  potassium chloride SA (KLOR-CON) 20 MEQ tablet Take 20 mEq by mouth daily. 07/07/20   [provider]  Propylene Glycol (SYSTANE BALANCE) 0.6 % SOLN Place 1 drop into both eyes every 6 (six) hours.     [provider]  Rivaroxaban (XARELTO) 15 MG TABS tablet Take 1 tablet (15 mg total) by mouth 2 (two) times  daily. Patient not taking: Reported on 07/29/2020 12/09/19   Erick Colace, NP  rivaroxaban (XARELTO) 20 MG TABS tablet Take 1 tablet (20 mg total) by mouth daily. Patient not taking: Reported on 07/29/2020 12/28/19   Erick Colace, NP  sodium chloride (OCEAN) 0.65 % SOLN nasal spray Place 2 sprays into both nostrils daily as needed for congestion (or allergic rhinitis).     [provider]  sodium chloride flush (NS) 0.9 % SOLN 10-40 mLs by Intracatheter route as needed (flush). Patient taking differently: Inject 10 mLs into the vein 2 (two) times daily.  12/09/19   Erick Colace, NP  torsemide (DEMADEX) 20 MG tablet Take 20 mg by mouth daily.    [provider]  traZODone (DESYREL) 150 MG tablet Take 1 tablet (150 mg total) by mouth at bedtime. 09/13/18   Ward, Ozella Almond, PA-C  venlafaxine XR (EFFEXOR-XR) 75 MG 24 hr capsule Take 225 mg by mouth daily with breakfast.    [provider]  XTAMPZA ER 9 MG C12A Take 9 mg by mouth 2 (two) times daily. 07/28/20   [provider]      Allergies    Meperidine and related and Cefazolin    Review of Systems   Review of Systems  Constitutional:  Negative for chills and fever.  HENT:  Negative for ear pain and sore throat.   Eyes:  Negative for pain and visual disturbance.  Respiratory:  Negative for cough and shortness of breath.   Cardiovascular:  Negative for chest pain and palpitations.  Gastrointestinal:  Negative for abdominal pain and vomiting.  Genitourinary:  Negative for dysuria and hematuria.  Musculoskeletal:  Negative for arthralgias and back pain.  Skin:  Negative for color change and rash.  Neurological:  Negative for seizures and syncope.  All other systems reviewed and are negative.   Physical Exam Updated Vital Signs BP (!) 68/46   Pulse (!) 59   Temp 99.1 F (37.3 C) (Oral)   Resp (!) 22   Ht 1.676 m ('5\' 6"'$ )   Wt (!) 139.5 kg   SpO2 97%   BMI 49.64 kg/m  Physical  Exam Vitals and nursing note reviewed.  Constitutional:      General: She is not in acute distress.    Appearance: She is well-developed. She is obese.  HENT:     Head: Normocephalic and atraumatic.  Eyes:     Conjunctiva/sclera: Conjunctivae normal.  Neck:     Comments: Trach in place.  On ventilator Cardiovascular:     Rate and Rhythm: Normal rate and regular rhythm.     Heart sounds: No murmur heard. Pulmonary:     Effort: Pulmonary effort is normal. No respiratory distress.     Breath sounds:  Normal breath sounds.  Abdominal:     Palpations: Abdomen is soft.     Tenderness: There is no abdominal tenderness.  Musculoskeletal:        General: No swelling.     Cervical back: Neck supple.  Skin:    General: Skin is warm and dry.     Capillary Refill: Capillary refill takes less than 2 seconds.  Neurological:     Mental Status: She is alert. Mental status is at baseline.  Psychiatric:        Mood and Affect: Mood normal.     ED Results / Procedures / Treatments   Labs (all labs ordered are listed, but only abnormal results are displayed) Labs Reviewed  CBC WITH DIFFERENTIAL/PLATELET - Abnormal; Notable for the following components:      Result Value   RBC 3.44 (*)    Hemoglobin 9.3 (*)    HCT 30.9 (*)    RDW 16.6 (*)    All other components within normal limits  COMPREHENSIVE METABOLIC PANEL - Abnormal; Notable for the following components:   Chloride 95 (*)    Glucose, Bld 152 (*)    BUN 31 (*)    Creatinine, Ser 1.65 (*)    Calcium 8.5 (*)    Albumin 2.3 (*)    AST 13 (*)    GFR, Estimated 36 (*)    All other components within normal limits  I-STAT ARTERIAL BLOOD GAS, ED - Abnormal; Notable for the following components:   pH, Arterial 7.508 (*)    pO2, Arterial 163 (*)    Bicarbonate 36.8 (*)    TCO2 38 (*)    Acid-Base Excess 12.0 (*)    Calcium, Ion 1.12 (*)    HCT 27.0 (*)    Hemoglobin 9.2 (*)    All other components within normal limits  CULTURE,  BLOOD (ROUTINE X 2)  CULTURE, BLOOD (ROUTINE X 2)  MRSA CULTURE  LACTIC ACID, PLASMA  URINALYSIS, ROUTINE W REFLEX MICROSCOPIC  LACTIC ACID, PLASMA    EKG None  Radiology CT Chest Wo Contrast  Result Date: 07/30/2022 CLINICAL DATA:  Lethargy, weakness, ventilator dependent EXAM: CT CHEST WITHOUT CONTRAST TECHNIQUE: Multidetector CT imaging of the chest was performed following the standard protocol without IV contrast. RADIATION DOSE REDUCTION: This exam was performed according to the departmental dose-optimization program which includes automated exposure control, adjustment of the mA and/or kV according to patient size and/or use of iterative reconstruction technique. COMPARISON:  07/30/2022, 02/13/2020 FINDINGS: Cardiovascular: Unenhanced imaging of the heart is unremarkable without pericardial effusion. Normal caliber of the thoracic aorta. Atherosclerosis of the aorta and coronary vasculature. Evaluation of the vascular lumen is limited without IV contrast. Mediastinum/Nodes: Tracheostomy tube in the expected position. Thyroid and esophagus are unremarkable. No pathologic adenopathy. Lungs/Pleura: There is a small right pleural effusion, with dependent consolidation in the right lower lobe concerning for pneumonia. Minimal hypoventilatory changes at the left lung base. Upper lobe predominant emphysema. No pneumothorax. Mucoid material and fluid are seen filling the right lower lobe bronchi. Upper Abdomen: No acute abnormality. Musculoskeletal: No acute or destructive bony lesions. Reconstructed images demonstrate no additional findings. IMPRESSION: 1. Multifocal right lower lobe consolidation consistent with pneumonia. 2. Small right parapneumonic effusion. 3. Opacification of the right lower lobe bronchi. 4. Aortic Atherosclerosis (ICD10-I70.0) and Emphysema (ICD10-J43.9). Electronically Signed   By: Randa Ngo M.D.   On: 07/30/2022 21:38   CT Head Wo Contrast  Result Date:  07/30/2022 CLINICAL DATA:  Mental status  change of unknown cause. EXAM: CT HEAD WITHOUT CONTRAST TECHNIQUE: Contiguous axial images were obtained from the base of the skull through the vertex without intravenous contrast. RADIATION DOSE REDUCTION: This exam was performed according to the departmental dose-optimization program which includes automated exposure control, adjustment of the mA and/or kV according to patient size and/or use of iterative reconstruction technique. COMPARISON:  02/26/2021. FINDINGS: Brain: No evidence of acute infarction, hemorrhage, hydrocephalus, extra-axial collection or mass lesion/mass effect. Vascular: No hyperdense vessel or unexpected calcification. Skull: Normal. Negative for fracture or focal lesion. Sinuses/Orbits: Globes and orbits are unremarkable. Sinuses are clear. Other: None. IMPRESSION: 1. No acute intracranial abnormalities. No change from the prior head CT. Electronically Signed   By: Lajean Manes M.D.   On: 07/30/2022 18:17   DG Chest Port 1 View  Result Date: 07/30/2022 CLINICAL DATA:  Altered mental status. Chronic ventilator dependent. Lethargy and weakness. EXAM: PORTABLE CHEST 1 VIEW COMPARISON:  02/26/2021. FINDINGS: Cardiac silhouette is normal in size. No mediastinal or hilar masses. Tracheostomy tube is stable. There is opacity at lung bases similar on the left to the prior study, increased on the right, likely atelectasis. Remainder of the lungs is clear. No definitive pleural effusion.  No pneumothorax. Skeletal structures are demineralized, grossly intact. IMPRESSION: 1. Increased opacity at the right lung base suspected to be atelectasis. Pneumonia is possible. 2. No other evidence of acute cardiopulmonary disease and no other change from the prior study. Electronically Signed   By: Lajean Manes M.D.   On: 07/30/2022 18:15    Procedures Procedures    Medications Ordered in ED Medications  aztreonam (AZACTAM) 2 g in sodium chloride 0.9 % 100  mL IVPB (has no administration in time range)  vancomycin (VANCOCIN) IVPB 1000 mg/200 mL premix (has no administration in time range)    Followed by  vancomycin (VANCOCIN) IVPB 1000 mg/200 mL premix (has no administration in time range)  vancomycin (VANCOREADY) IVPB 1250 mg/250 mL (has no administration in time range)    ED Course/ Medical Decision Making/ A&P                           Medical Decision Making Amount and/or Complexity of Data Reviewed Labs: ordered. Radiology: ordered.  Risk Prescription drug management.   Patient CT head without any acute changes.  [place] head CT was February 26, 2021.  No changes.  Chest x-ray raises concerns about atelectasis versus possible pneumonia in the right lung base increase up is at [place] there.  No evidence of acute cardiopulmonary disease otherwise.  Admits compared to chest x-ray on February 26, 2021.  We will discuss with [facility] to see if they want CT of the chest or any further work-up.  Patient not febrile here.  We did not do any additional lab work because she was sent here just with the request for CT head and chest.  So we will see what the game plan is.  Nursing contacted [facility] nurse.  They can to contact their provider on-call to see what additional evaluation they may want.  On-call physician for [facility] does want CT chest to rule out the pneumonia they also want lab work UA and blood gas.  We will order these things.  They did confirm that her blood pressures do go down when she is sleeping blood pressures have been a little bit low since she has been here at least on our monitoring with systolics around 95 they  do wax and wane we have not had a consistent trend of it being low.   Final Clinical Impression(s) / ED Diagnoses Final diagnoses:  Altered mental status, unspecified altered mental status type  Hospital-acquired pneumonia    Rx / DC Orders ED Discharge Orders     None         Fredia Sorrow, MD 07/30/22  1947    Fredia Sorrow, MD 07/30/22 2039    Fredia Sorrow, MD 07/30/22 2101  Addendum: CT chest does seem to confirm a right lower lobe pneumonia.  Patient white blood cell count is normal though at 6.3.  We will go ahead and start IV antibiotics for healthcare acquired pneumonia.  Patient apparently has an allergy to cephalosporins.  Although not well-defined what that would be.  Used a protocol to order the antibiotics.  That would be Azactam and vancomycin.  Blood cultures lactic acid pending hemoglobin down a little bit at 9.3 complete metabolic panel pending blood gas reassuring pH 7.5 PO2 163 PCO2 46.4 with a bicarb of 36.8.  They may want to make some subtle adjustments.  But nothing significantly abnormal.  Once we get the rest of the labs back we will have the nurse recontact nurse at the [place] facility and then they can discuss it with their on-call provider.  Patient's blood pressure while we were establishing IVs to come up to 646 systolic.  Clinically not overly concerned about sepsis.  Patient does have low blood pressures when she is sleeping according to those the care for at [facility].  However this could change if the lactic acid is markedly elevated.  His lactic acid was normal.  I think that patient should be treated for the pneumonia.  IV antibiotics been ordered.  Blood cultures are pending.  Do not feel that she needs admission for that as oxygen levels have been fine blood pressures currently 97 systolic.  We will have him recontact [facility] talk to their provider I think patient can be discharged back to [facility] for treatment of the pneumonia.  They did want a urinalysis we will have to in and out For that but I think she is going need to be treated with antibiotics anyways that would probably take care of any urinary tract infection.  In addition no concerns for hypoxia.    Fredia Sorrow, MD 07/30/22 8032    Fredia Sorrow, MD 07/30/22 1224     Fredia Sorrow, MD 07/30/22 [phone]

## 2022-07-30 NOTE — ED Notes (Signed)
[place], RN at [facility] updated on results. [facility] MD to call and discuss plan of care with EDP

## 2022-07-30 NOTE — Progress Notes (Signed)
Per EMS Pt wears trach collar during the day and ventilator QHS. EMS stated [facility] lately has placed Pt on ventilator during the day and night. Pt was placed on ventilator upon arrival on the following settings. Trach supplies pending.  07/30/22 1749  Adult Ventilator Settings  Vent Type Servo i  Humidity HME  Vent Mode PRVC  Vt Set (S)  470 mL (8CCs)  Set Rate 18 bmp  FiO2 (%) 60 %  PEEP 5 cmH20

## 2022-07-30 NOTE — Progress Notes (Signed)
Pharmacy Antibiotic Note  Kim Jenkins is a 56 y.o. female admitted on 07/30/2022 from [facility] with pneumonia.  Pharmacy has been consulted for vancomycin dosing. Patient is also on aztreonam.  11/12 Vancomycin '1250mg'$  Q 24 hr Scr used: 1.65 mg/dL Weight: 139.5 kg Vd coeff: 0.5 L/kg Est AUC: 533   Plan: Vancomycin '2000mg'$  then 1250 mg Q 24 hr Continue aztreonam per team Monitor cultures/MRSA, clinical status, renal function, vancomycin level Narrow abx as able and f/u duration    Height: '5\' 6"'$  (167.6 cm) Weight: (!) 139.5 kg (307 lb 8.7 oz) IBW/kg (Calculated) : 59.3  Temp (24hrs), Avg:98.8 F (37.1 C), Min:98.4 F (36.9 C), Max:99.1 F (37.3 C)  Recent Labs  Lab 07/30/22 2149  WBC 6.3    CrCl cannot be calculated (Patient's most recent lab result is older than the maximum 21 days allowed.).    Allergies  Allergen Reactions   Meperidine And Related Shortness Of Breath and Other (See Comments)    "Allergic," per MAR   Cefazolin Other (See Comments)    "Allergic," per [facility]    Antimicrobials this admission: Vanc 11/12 >>  Aztreonam 11/12 >>   Dose adjustments this admission: N/a  Microbiology results: 11/12 BCx: pend 11/12 MRSA: pend   Thank you for allowing pharmacy to be a part of this patient's care.   Benetta Spar, PharmD, BCPS, BCCP Clinical Pharmacist  Please check AMION for all [place] phone numbers After 10:00 PM, call [facility] [phone]

## 2022-07-30 NOTE — ED Triage Notes (Signed)
Pt BIB EMS from [facility] for emergent scans. Pt has been having increased lethargy and weakness. Per facility, up until a month ago she was able to communicate in complete sentences. She is now only able to give thumbs-up and thumbs-down responses. Facility requesting a CT of the head and a chest XR.  EMS vitals 126/80 23 RR ETCO2 60 93% on 3L  CBG 161

## 2022-07-30 NOTE — Progress Notes (Signed)
Pt was transported to CT via ventilator with no apparent complications at this time. RT will continue to monitor as needed.

## 2022-07-31 DIAGNOSIS — J189 Pneumonia, unspecified organism: Secondary | ICD-10-CM | POA: Diagnosis not present

## 2022-07-31 DIAGNOSIS — R4182 Altered mental status, unspecified: Secondary | ICD-10-CM | POA: Diagnosis present

## 2022-07-31 DIAGNOSIS — I5032 Chronic diastolic (congestive) heart failure: Secondary | ICD-10-CM | POA: Diagnosis not present

## 2022-07-31 DIAGNOSIS — Z79899 Other long term (current) drug therapy: Secondary | ICD-10-CM | POA: Diagnosis not present

## 2022-07-31 LAB — URINALYSIS, ROUTINE W REFLEX MICROSCOPIC
Bilirubin Urine: NEGATIVE
Glucose, UA: NEGATIVE mg/dL
Hgb urine dipstick: NEGATIVE
Ketones, ur: NEGATIVE mg/dL
Nitrite: NEGATIVE
Protein, ur: 30 mg/dL — AB
Specific Gravity, Urine: 1.019 (ref 1.005–1.030)
pH: 6 (ref 5.0–8.0)

## 2022-07-31 MED ORDER — LACTATED RINGERS IV BOLUS
500.0000 mL | Freq: Once | INTRAVENOUS | Status: AC
  Administered 2022-07-31: 500 mL via INTRAVENOUS

## 2022-07-31 MED ORDER — SODIUM CHLORIDE 0.9 % IV SOLN: INTRAVENOUS | Status: DC

## 2022-07-31 NOTE — ED Notes (Signed)
Called [facility] to transport patient back to [facility].

## 2022-08-03 ENCOUNTER — Encounter (HOSPITAL_COMMUNITY): Payer: Self-pay

## 2022-08-03 ENCOUNTER — Ambulatory Visit (HOSPITAL_COMMUNITY): Payer: Medicare Other

## 2022-08-04 LAB — CULTURE, BLOOD (ROUTINE X 2)
Culture: NO GROWTH
Culture: NO GROWTH
Special Requests: ADEQUATE
Special Requests: ADEQUATE

## 2022-08-04 LAB — MRSA CULTURE

## 2022-08-05 ENCOUNTER — Telehealth (HOSPITAL_BASED_OUTPATIENT_CLINIC_OR_DEPARTMENT_OTHER): Payer: Self-pay | Admitting: Emergency Medicine

## 2022-08-05 NOTE — Progress Notes (Signed)
ED Antimicrobial Stewardship Positive Culture Follow Up   Kim Jenkins is an 56 y.o. female who presented to [facility] on 07/30/2022 with a chief complaint of  Chief Complaint  Patient presents with   Altered Mental Status    Recent Results (from the past 720 hour(s))  Culture, blood (Routine X 2) w Reflex to ID Panel     Status: None   Collection Time: 07/30/22  9:49 PM   Specimen: BLOOD  Result Value Ref Range Status   Specimen Description BLOOD LEFT ANTECUBITAL  Final   Special Requests   Final    BOTTLES DRAWN AEROBIC AND ANAEROBIC Blood Culture adequate volume   Culture   Final    NO GROWTH 5 DAYS Performed at [facility], [address] [place], [place] [address]    Report Status 08/04/2022 FINAL  Final  Culture, blood (Routine X 2) w Reflex to ID Panel     Status: None   Collection Time: 07/30/22 10:24 PM   Specimen: BLOOD RIGHT FOREARM  Result Value Ref Range Status   Specimen Description BLOOD RIGHT FOREARM  Final   Special Requests   Final    BOTTLES DRAWN AEROBIC AND ANAEROBIC Blood Culture adequate volume   Culture   Final    NO GROWTH 5 DAYS Performed at [facility], [place] [address] [place], [place] [address]    Report Status 08/04/2022 FINAL  Final  MRSA culture     Status: Abnormal   Collection Time: 07/31/22  5:23 AM   Specimen: Nose; Body Fluid  Result Value Ref Range Status   Specimen Description NOSE  Final   Special Requests   Final    NONE Performed at [facility], [address] [place], [place] [address]    Culture METHICILLIN RESISTANT STAPHYLOCOCCUS AUREUS (A)  Final   Report Status 08/04/2022 FINAL  Final   Organism ID, Bacteria METHICILLIN RESISTANT STAPHYLOCOCCUS AUREUS  Final      Susceptibility   Methicillin resistant staphylococcus aureus - MIC*    CIPROFLOXACIN >=8 RESISTANT Resistant     ERYTHROMYCIN >=8 RESISTANT Resistant     GENTAMICIN <=0.5 SENSITIVE Sensitive     OXACILLIN >=4 RESISTANT Resistant      TETRACYCLINE <=1 SENSITIVE Sensitive     VANCOMYCIN <=0.5 SENSITIVE Sensitive     TRIMETH/SULFA >=320 RESISTANT Resistant     CLINDAMYCIN <=0.25 SENSITIVE Sensitive     RIFAMPIN <=0.5 SENSITIVE Sensitive     Inducible Clindamycin NEGATIVE Sensitive     * METHICILLIN RESISTANT STAPHYLOCOCCUS AUREUS    '[x]'$  Patient discharged back to [facility]. Received appropriate antibiotics in ED. Please, notify patient's care team at [facility] for their records and review. Thanks!   ED Provider: Campbell Stall MD   Lorelei Pont, PharmD, BCPS 08/05/2022 10:09 AM ED Clinical Pharmacist -  [phone]

## 2022-08-27 ENCOUNTER — Emergency Department (HOSPITAL_COMMUNITY): Payer: Medicare Other

## 2022-08-27 ENCOUNTER — Other Ambulatory Visit: Payer: Self-pay

## 2022-08-27 ENCOUNTER — Emergency Department (HOSPITAL_COMMUNITY)
Admission: EM | Admit: 2022-08-27 | Discharge: 2022-08-27 | Disposition: A | Discharge status: Home / Self Care | Payer: Medicare Other | Attending: Emergency Medicine | Admitting: Emergency Medicine

## 2022-08-27 DIAGNOSIS — Z20822 Contact with and (suspected) exposure to covid-19: Secondary | ICD-10-CM | POA: Insufficient documentation

## 2022-08-27 DIAGNOSIS — Z79899 Other long term (current) drug therapy: Secondary | ICD-10-CM | POA: Insufficient documentation

## 2022-08-27 DIAGNOSIS — Z794 Long term (current) use of insulin: Secondary | ICD-10-CM | POA: Insufficient documentation

## 2022-08-27 DIAGNOSIS — I959 Hypotension, unspecified: Secondary | ICD-10-CM | POA: Insufficient documentation

## 2022-08-27 DIAGNOSIS — I700 Atherosclerosis of aorta: Secondary | ICD-10-CM | POA: Insufficient documentation

## 2022-08-27 DIAGNOSIS — R4182 Altered mental status, unspecified: Secondary | ICD-10-CM | POA: Diagnosis not present

## 2022-08-27 DIAGNOSIS — Z7951 Long term (current) use of inhaled steroids: Secondary | ICD-10-CM | POA: Diagnosis not present

## 2022-08-27 DIAGNOSIS — D3502 Benign neoplasm of left adrenal gland: Secondary | ICD-10-CM | POA: Insufficient documentation

## 2022-08-27 DIAGNOSIS — J439 Emphysema, unspecified: Secondary | ICD-10-CM | POA: Insufficient documentation

## 2022-08-27 DIAGNOSIS — J90 Pleural effusion, not elsewhere classified: Secondary | ICD-10-CM | POA: Insufficient documentation

## 2022-08-27 LAB — I-STAT CHEM 8, ED
BUN: 29 mg/dL — ABNORMAL HIGH (ref 6–20)
Calcium, Ion: 1.14 mmol/L — ABNORMAL LOW (ref 1.15–1.40)
Chloride: 105 mmol/L (ref 98–111)
Creatinine, Ser: 1.3 mg/dL — ABNORMAL HIGH (ref 0.44–1.00)
Glucose, Bld: 121 mg/dL — ABNORMAL HIGH (ref 70–99)
HCT: 34 % — ABNORMAL LOW (ref 36.0–46.0)
Hemoglobin: 11.6 g/dL — ABNORMAL LOW (ref 12.0–15.0)
Potassium: 4 mmol/L (ref 3.5–5.1)
Sodium: 142 mmol/L (ref 135–145)
TCO2: 28 mmol/L (ref 22–32)

## 2022-08-27 LAB — COMPREHENSIVE METABOLIC PANEL
ALT: 9 U/L (ref 0–44)
AST: 14 U/L — ABNORMAL LOW (ref 15–41)
Albumin: 2.2 g/dL — ABNORMAL LOW (ref 3.5–5.0)
Alkaline Phosphatase: 56 U/L (ref 38–126)
Anion gap: 12 (ref 5–15)
BUN: 30 mg/dL — ABNORMAL HIGH (ref 6–20)
CO2: 26 mmol/L (ref 22–32)
Calcium: 8.7 mg/dL — ABNORMAL LOW (ref 8.9–10.3)
Chloride: 103 mmol/L (ref 98–111)
Creatinine, Ser: 1.27 mg/dL — ABNORMAL HIGH (ref 0.44–1.00)
GFR, Estimated: 49 mL/min — ABNORMAL LOW (ref >60)
Glucose, Bld: 115 mg/dL — ABNORMAL HIGH (ref 70–99)
Potassium: 4.2 mmol/L (ref 3.5–5.1)
Sodium: 141 mmol/L (ref 135–145)
Total Bilirubin: 0.3 mg/dL (ref 0.3–1.2)
Total Protein: 7.1 g/dL (ref 6.5–8.1)

## 2022-08-27 LAB — CBC WITH DIFFERENTIAL/PLATELET
Abs Immature Granulocytes: 0.02 10*3/uL (ref 0.00–0.07)
Basophils Absolute: 0 10*3/uL (ref 0.0–0.1)
Basophils Relative: 0 %
Eosinophils Absolute: 0.1 10*3/uL (ref 0.0–0.5)
Eosinophils Relative: 2 %
HCT: 35.9 % — ABNORMAL LOW (ref 36.0–46.0)
Hemoglobin: 11.1 g/dL — ABNORMAL LOW (ref 12.0–15.0)
Immature Granulocytes: 0 %
Lymphocytes Relative: 19 %
Lymphs Abs: 1.2 10*3/uL (ref 0.7–4.0)
MCH: 27.8 pg (ref 26.0–34.0)
MCHC: 30.9 g/dL (ref 30.0–36.0)
MCV: 90 fL (ref 80.0–100.0)
Monocytes Absolute: 0.7 10*3/uL (ref 0.1–1.0)
Monocytes Relative: 12 %
Neutro Abs: 4.3 10*3/uL (ref 1.7–7.7)
Neutrophils Relative %: 67 %
Platelets: 163 10*3/uL (ref 150–400)
RBC: 3.99 MIL/uL (ref 3.87–5.11)
RDW: 16.8 % — ABNORMAL HIGH (ref 11.5–15.5)
WBC: 6.4 10*3/uL (ref 4.0–10.5)
nRBC: 0 % (ref 0.0–0.2)

## 2022-08-27 LAB — LACTIC ACID, PLASMA
Lactic Acid, Venous: 0.8 mmol/L (ref 0.5–1.9)
Lactic Acid, Venous: 1.4 mmol/L (ref 0.5–1.9)

## 2022-08-27 LAB — I-STAT VENOUS BLOOD GAS, ED
Acid-Base Excess: 3 mmol/L — ABNORMAL HIGH (ref 0.0–2.0)
Bicarbonate: 28.5 mmol/L — ABNORMAL HIGH (ref 20.0–28.0)
Calcium, Ion: 1.18 mmol/L (ref 1.15–1.40)
HCT: 34 % — ABNORMAL LOW (ref 36.0–46.0)
Hemoglobin: 11.6 g/dL — ABNORMAL LOW (ref 12.0–15.0)
O2 Saturation: 99 %
Potassium: 4 mmol/L (ref 3.5–5.1)
Sodium: 142 mmol/L (ref 135–145)
TCO2: 30 mmol/L (ref 22–32)
pCO2, Ven: 44.3 mmHg (ref 44–60)
pH, Ven: 7.416 (ref 7.25–7.43)
pO2, Ven: 133 mmHg — ABNORMAL HIGH (ref 32–45)

## 2022-08-27 LAB — RESP PANEL BY RT-PCR (RSV, FLU A&B, COVID)  RVPGX2
Influenza A by PCR: NEGATIVE
Influenza B by PCR: NEGATIVE
Resp Syncytial Virus by PCR: NEGATIVE
SARS Coronavirus 2 by RT PCR: NEGATIVE

## 2022-08-27 MED ORDER — SODIUM CHLORIDE 0.9 % IV BOLUS
500.0000 mL | Freq: Once | INTRAVENOUS | Status: AC
  Administered 2022-08-27: 500 mL via INTRAVENOUS

## 2022-08-27 MED ORDER — AMOXICILLIN-POT CLAVULANATE 875-125 MG PO TABS: 1.0000 | ORAL_TABLET | Freq: Two times a day (BID) | ORAL | 0 refills | Status: DC

## 2022-08-27 NOTE — ED Provider Notes (Signed)
Signout from Dr. Francia Greaves.  56 year old female resident at [facility] sent over for low blood pressure and altered mental status.  At baseline patient is vent dependent on trach.  Blood pressures have been stable here.  Lab work fairly unremarkable.  CT imaging does show some what sounds like more chronic findings.  Still pending urinalysis.  Plan is to review workup with PCCM to see if they have any recommendations for admission versus return to facility. Physical Exam  BP 93/74   Pulse 72   Temp 98.6 F (37 C) (Oral)   Resp 16   SpO2 99%   Physical Exam  Procedures  Procedures  ED Course / MDM    Medical Decision Making Amount and/or Complexity of Data Reviewed Labs: ordered. Radiology: ordered.   Critical care evaluated patient's current workup.  They do not feel she needs admission to the hospital at this time.  They feel she probably has some level of chronic aspiration.  They are recommending she goes on Augmentin for 7 days.       Hayden Rasmussen, MD 08/28/22 [id number]

## 2022-08-27 NOTE — ED Notes (Signed)
Pts O2 saturation is not 72%..it is 94%

## 2022-08-27 NOTE — ED Notes (Signed)
Pt still has not created urine

## 2022-08-27 NOTE — ED Notes (Signed)
Purewick in place, pt encouraged to urinate

## 2022-08-27 NOTE — ED Notes (Signed)
RN reviewed discharge instructions with pt and PTAR. Both verbalized understanding and had no further questions. VSS upon discharge

## 2022-08-27 NOTE — Discharge Instructions (Signed)
You were seen in the emergency department for evaluation of low blood pressure and somnolence this morning, low urine output.  Your lab work was fairly unremarkable.  You had a CAT scan of your head and chest.  The CAT scan of your chest showed some possible infectious or inflammatory changes and the pulmonology team here is recommending that you go on Augmentin for 7 days.  They did not feel you needed admission at this time.  Please return to the emergency department if any worsening or concerning symptoms.

## 2022-08-27 NOTE — ED Provider Notes (Signed)
[facility] EMERGENCY DEPARTMENT Provider Note   CSN: [id number] Arrival date & time: 08/27/22  5397     History {Add pertinent medical, surgical, social history, OB history to HPI:1} Chief Complaint  Patient presents with   Altered Mental Status   Hypotension    Kim Jenkins is a 56 y.o. female.  56 year old female arrives from [facility] via EMS transport.  Patient has been dependent.  Patient was sent for evaluation of reported low blood pressures, decreased urine output, and disorientation.  Patient was noted to be normotensive with EMS - systolic BP of approximately 105-110.  Patient is at baseline mental status.  Patient without complaint.  Additional history obtained from Darylene Price (nurse at [facility]) who reports blood pressure 84/53 this a.m.  Patient appears to be somewhat disoriented early this morning at [facility].  No fevers noted.  Decreased urine output noted by nursing at [facility] over the last 4 to 5 days. This has been treated with intermittent IV boluses.  Notably patient was given 150 mg of trazodone (a new medication) starting on 12/9 for sleep.  This was decreased within past 24 hours to 50 mg of trazodone nightly.  The history is provided by the patient, the EMS personnel, the nursing home and medical records.       Home Medications Prior to Admission medications   Medication Sig Start Date End Date Taking? Authorizing Provider  ARIPiprazole (ABILIFY) 2 MG tablet Take 2 mg by mouth daily. 07/07/20   [provider]  ARIPiprazole (ABILIFY) 5 MG tablet Take 1 tablet (5 mg total) by mouth daily. Patient not taking: Reported on 07/29/2020 09/13/18   Ward, Ozella Almond, PA-C  Cholecalciferol (VITAMIN D-3) 25 MCG (1000 UT) CAPS Take 1,000 Units by mouth daily.    [provider]  dextromethorphan-guaiFENesin (ROBITUSSIN-DM) 10-100 MG/5ML liquid Take 5 mLs by mouth every 6 (six) hours.     [provider]  diazepam  (VALIUM) 2 MG tablet Take 2 mg by mouth 2 (two) times daily. 07/07/20   [provider]  divalproex (DEPAKOTE ER) 500 MG 24 hr tablet Take 2 tablets (1,000 mg total) by mouth every 12 (twelve) hours. 09/13/18   Ward, Ozella Almond, PA-C  Exenatide ER (BYDUREON) 2 MG PEN Inject 2 mg into the skin every Friday.     [provider]  famotidine (PEPCID) 20 MG tablet Take 20 mg by mouth daily.     [provider]  fluticasone (FLONASE) 50 MCG/ACT nasal spray Place 2 sprays into both nostrils daily.     [provider]  hydrALAZINE (APRESOLINE) 50 MG tablet Take 50 mg by mouth every 6 (six) hours as needed (SBP > 160 and DPB > 100).    [provider]  insulin glargine (LANTUS SOLOSTAR) 100 UNIT/ML Solostar Pen Inject 20 Units into the skin daily.    [provider]  ipratropium-albuterol (DUONEB) 0.5-2.5 (3) MG/3ML SOLN Inhale 3 mLs into the lungs as needed (for dyspnea).  10/20/19   [provider]  lidocaine (LIDODERM) 5 % Place 1 patch onto the skin daily. Remove & Discard patch within 12 hours or as directed by MD    [provider]  loperamide (IMODIUM A-D) 2 MG tablet Take 2 mg by mouth every 6 (six) hours as needed for diarrhea or loose stools.     [provider]  metoprolol tartrate (LOPRESSOR) 50 MG tablet Take 50 mg by mouth every 12 (twelve) hours.     [provider]  Multiple Vitamin (MULTIVITAMIN WITH MINERALS) TABS tablet Take 1 tablet by mouth daily.    [provider]  nitroGLYCERIN (NITROSTAT) 0.4 MG SL tablet Place 0.4 mg under the tongue every 5 (five) minutes x 3 doses as needed for chest pain (AND CALL PROVIDER).     [provider]  olopatadine (PATANOL) 0.1 % ophthalmic solution Place 1 drop into both eyes 2 (two) times daily.    [provider]  ondansetron (ZOFRAN-ODT) 4 MG disintegrating tablet Take 4 mg by mouth every 6 (six) hours as needed for nausea or vomiting  (DISSOLVE ORALLY).    [provider]  potassium chloride SA (KLOR-CON) 20 MEQ tablet Take 20 mEq by mouth daily. 07/07/20   [provider]  Propylene Glycol (SYSTANE BALANCE) 0.6 % SOLN Place 1 drop into both eyes every 6 (six) hours.     [provider]  Rivaroxaban (XARELTO) 15 MG TABS tablet Take 1 tablet (15 mg total) by mouth 2 (two) times daily. Patient not taking: Reported on 07/29/2020 12/09/19   Erick Colace, NP  rivaroxaban (XARELTO) 20 MG TABS tablet Take 1 tablet (20 mg total) by mouth daily. Patient not taking: Reported on 07/29/2020 12/28/19   Erick Colace, NP  sodium chloride (OCEAN) 0.65 % SOLN nasal spray Place 2 sprays into both nostrils daily as needed for congestion (or allergic rhinitis).     [provider]  sodium chloride flush (NS) 0.9 % SOLN 10-40 mLs by Intracatheter route as needed (flush). Patient taking differently: Inject 10 mLs into the vein 2 (two) times daily.  12/09/19   Erick Colace, NP  torsemide (DEMADEX) 20 MG tablet Take 20 mg by mouth daily.    [provider]  traZODone (DESYREL) 150 MG tablet Take 1 tablet (150 mg total) by mouth at bedtime. 09/13/18   Ward, Ozella Almond, PA-C  venlafaxine XR (EFFEXOR-XR) 75 MG 24 hr capsule Take 225 mg by mouth daily with breakfast.    [provider]  XTAMPZA ER 9 MG C12A Take 9 mg by mouth 2 (two) times daily. 07/28/20   [provider]      Allergies    Meperidine and related and Cefazolin    Review of Systems   Review of Systems  Unable to perform ROS: Acuity of condition  All other systems reviewed and are negative.   Physical Exam Updated Vital Signs BP 102/79   Pulse 74   Temp 98.6 F (37 C) (Oral)   Resp 17   SpO2 98%  Physical Exam Vitals and nursing note reviewed.  Constitutional:      General: She is not in acute distress.    Appearance: Normal appearance. She is well-developed.  HENT:     Head: Normocephalic and  atraumatic.     Mouth/Throat:     Comments: Status post trach, vent dependent Eyes:     Conjunctiva/sclera: Conjunctivae normal.     Pupils: Pupils are equal, round, and reactive to light.  Cardiovascular:     Rate and Rhythm: Normal rate and regular rhythm.     Heart sounds: Normal heart sounds.  Pulmonary:     Effort: Pulmonary effort is normal. No respiratory distress.     Breath sounds: Normal breath sounds.  Abdominal:     General: There is no distension.     Palpations: Abdomen is soft.     Tenderness: There is no abdominal tenderness.  Musculoskeletal:  General: No deformity. Normal range of motion.     Cervical back: Normal range of motion and neck supple.  Skin:    General: Skin is warm and dry.  Neurological:     General: No focal deficit present.     Mental Status: She is alert and oriented to person, place, and time.     ED Results / Procedures / Treatments   Labs (all labs ordered are listed, but only abnormal results are displayed) Labs Reviewed  CULTURE, BLOOD (ROUTINE X 2)  CULTURE, BLOOD (ROUTINE X 2)  RESP PANEL BY RT-PCR (RSV, FLU A&B, COVID)  [id number]  COMPREHENSIVE METABOLIC PANEL  CBC WITH DIFFERENTIAL/PLATELET  LACTIC ACID, PLASMA  LACTIC ACID, PLASMA  URINALYSIS, ROUTINE W REFLEX MICROSCOPIC  I-STAT CHEM 8, ED  I-STAT VENOUS BLOOD GAS, ED    EKG EKG Interpretation  Date/Time:  Sunday August 27 2022 10:02:47 EST Ventricular Rate:  73 PR Interval:  129 QRS Duration: 78 QT Interval:  411 QTC Calculation: 453 R Axis:   70 Text Interpretation: Sinus rhythm Low voltage, precordial leads Abnormal R-wave progression, early transition Confirmed by Dene Gentry [phone]) on 08/27/2022 10:04:25 AM  Radiology DG Chest Port 1 View  Result Date: 08/27/2022 CLINICAL DATA:  Hypotension. Altered mental status and ventilator dependent. EXAM: PORTABLE CHEST 1 VIEW COMPARISON:  07/30/2022 FINDINGS: There is a right arm PICC line with tip at the  superior cavoatrial junction. Tracheostomy tube tip is above the carina. Stable cardiomediastinal contours. Decreased aeration to the right base is favored to represent small pleural effusion with overlying atelectasis. Left lung appears clear. Remote right posterior rib fracture deformities are again noted. IMPRESSION: Decreased aeration to the right base is favored to represent small pleural effusion with overlying atelectasis or airspace disease. Electronically Signed   By: Kerby Moors M.D.   On: 08/27/2022 10:26    Procedures Procedures  {Document cardiac monitor, telemetry assessment procedure when appropriate:1}  Medications Ordered in ED Medications  sodium chloride 0.9 % bolus 500 mL (has no administration in time range)    ED Course/ Medical Decision Making/ A&P                           Medical Decision Making Amount and/or Complexity of Data Reviewed Labs: ordered. Radiology: ordered.    Medical Screen Complete  This patient presented to the ED with complaint of hypotension.  This complaint involves an extensive number of treatment options. The initial differential diagnosis includes, but is not limited to, dehydration, sepsis, metabolic abnormality, etc.  This presentation is: Acute, Chronic, Self-Limited, Previously Undiagnosed, Uncertain Prognosis, Complicated, Systemic Symptoms, and Threat to Life/Bodily Function  Patient is vent dependent at baseline.  She arrives from [facility] for evaluation of reported transient hypotension that was noted this morning.  Patient is not hypotensive during transport or here in the ED.  Patient's mental status is at baseline.  Notable detailed from history is recent introduction of trazodone into the patient's medications at [place].  She was receiving up to 150 mg nightly for the last several nights for sleep.  I suspect that this may play a role in the patient's transient hypotension and disorientation that was noted this  morning by [facility] staff.  Patient with negative COVID, flu, and RSV testing.  Patient's chemistries are without significant abnormality.  Patient is creatinine is 1.27 which is better than baseline.  White count is 6.4. Lactic acid is 1.4.  CT head  and CT chest are pending.  Co morbidities that complicated the patient's evaluation  Vent dependent, morbid obesity   Additional history obtained:  Additional history obtained from EMS External records from outside sources obtained and reviewed including prior ED visits and prior Inpatient records.    Lab Tests:  I ordered and personally interpreted labs.  The pertinent results include: CBC, CMP, i-STAT Chem-8, VBG, lactic acid   Imaging Studies ordered:  I ordered imaging studies including CXR, CT Head, CT Chest  I independently visualized and interpreted obtained imaging which showed *** I agree with the radiologist interpretation.   Cardiac Monitoring:  The patient was maintained on a cardiac monitor.  I personally viewed and interpreted the cardiac monitor which showed an underlying rhythm of: NSR   Medicines ordered:  I ordered medication including gentle IV fluid bolus for suspected dehydration Reevaluation of the patient after these medicines showed that the patient: stayed the same    Problem List / ED Course:  Transient hypotension   Reevaluation:  After the interventions noted above, I reevaluated the patient and found that they have: {resolved/improved/worsened:23923::"improved"}   Social Determinants of Health:  ***   Disposition:  After consideration of the diagnostic results and the patients response to treatment, I feel that the patent would benefit from ***.    {Document critical care time when appropriate:1} {Document review of labs and clinical decision tools ie heart score, Chads2Vasc2 etc:1}  {Document your independent review of radiology images, and any outside records:1} {Document your  discussion with family members, caretakers, and with consultants:1} {Document social determinants of health affecting pt's care:1} {Document your decision making why or why not admission, treatments were needed:1} Final Clinical Impression(s) / ED Diagnoses Final diagnoses:  None    Rx / DC Orders ED Discharge Orders     None

## 2022-08-27 NOTE — ED Notes (Signed)
Rn and NT cleaned up pt, changed sheets, and applied purwick. RN and NT readjusted pt in bed.

## 2022-08-27 NOTE — ED Triage Notes (Signed)
Pt BIB [facility] for AMS and hypotension. Pt is axox4 on arrival and BP is 104/70 with a MAP of 82. [facility] states pt was not hypotensive with them. Pt is at baseline on arrival.

## 2022-08-27 NOTE — ED Notes (Signed)
In and out cath unsuccessful due to patients anatomy. RN and NT attempted twice. MD notified.

## 2022-09-01 LAB — CULTURE, BLOOD (ROUTINE X 2)
Culture: NO GROWTH
Culture: NO GROWTH
Special Requests: ADEQUATE

## 2022-09-09 ENCOUNTER — Emergency Department (HOSPITAL_COMMUNITY): Payer: Medicare Other

## 2022-09-09 ENCOUNTER — Inpatient Hospital Stay (HOSPITAL_COMMUNITY)
Admission: EM | Admit: 2022-09-09 | Discharge: 2022-09-12 | DRG: 091 | Disposition: A | Discharge status: Other Acute Inpatient Hospital | Payer: Medicare Other | Source: Other Acute Inpatient Hospital | Attending: Internal Medicine | Admitting: Internal Medicine

## 2022-09-09 DIAGNOSIS — T43595A Adverse effect of other antipsychotics and neuroleptics, initial encounter: Secondary | ICD-10-CM | POA: Diagnosis present

## 2022-09-09 DIAGNOSIS — E873 Alkalosis: Secondary | ICD-10-CM | POA: Diagnosis present

## 2022-09-09 DIAGNOSIS — L89313 Pressure ulcer of right buttock, stage 3: Secondary | ICD-10-CM | POA: Diagnosis present

## 2022-09-09 DIAGNOSIS — F05 Delirium due to known physiological condition: Secondary | ICD-10-CM | POA: Diagnosis not present

## 2022-09-09 DIAGNOSIS — Z79891 Long term (current) use of opiate analgesic: Secondary | ICD-10-CM

## 2022-09-09 DIAGNOSIS — I10 Essential (primary) hypertension: Secondary | ICD-10-CM | POA: Diagnosis present

## 2022-09-09 DIAGNOSIS — T424X5A Adverse effect of benzodiazepines, initial encounter: Secondary | ICD-10-CM | POA: Diagnosis present

## 2022-09-09 DIAGNOSIS — T450X5A Adverse effect of antiallergic and antiemetic drugs, initial encounter: Secondary | ICD-10-CM | POA: Diagnosis present

## 2022-09-09 DIAGNOSIS — R55 Syncope and collapse: Principal | ICD-10-CM

## 2022-09-09 DIAGNOSIS — Z881 Allergy status to other antibiotic agents status: Secondary | ICD-10-CM

## 2022-09-09 DIAGNOSIS — Z9911 Dependence on respirator [ventilator] status: Secondary | ICD-10-CM

## 2022-09-09 DIAGNOSIS — L89326 Pressure-induced deep tissue damage of left buttock: Secondary | ICD-10-CM | POA: Diagnosis present

## 2022-09-09 DIAGNOSIS — Z1152 Encounter for screening for COVID-19: Secondary | ICD-10-CM | POA: Diagnosis not present

## 2022-09-09 DIAGNOSIS — T43215A Adverse effect of selective serotonin and norepinephrine reuptake inhibitors, initial encounter: Secondary | ICD-10-CM | POA: Diagnosis present

## 2022-09-09 DIAGNOSIS — G9340 Encephalopathy, unspecified: Secondary | ICD-10-CM | POA: Diagnosis not present

## 2022-09-09 DIAGNOSIS — L304 Erythema intertrigo: Secondary | ICD-10-CM | POA: Diagnosis present

## 2022-09-09 DIAGNOSIS — T40415A Adverse effect of fentanyl or fentanyl analogs, initial encounter: Secondary | ICD-10-CM | POA: Diagnosis present

## 2022-09-09 DIAGNOSIS — G8929 Other chronic pain: Secondary | ICD-10-CM | POA: Diagnosis present

## 2022-09-09 DIAGNOSIS — Z79899 Other long term (current) drug therapy: Secondary | ICD-10-CM

## 2022-09-09 DIAGNOSIS — E662 Morbid (severe) obesity with alveolar hypoventilation: Secondary | ICD-10-CM | POA: Diagnosis present

## 2022-09-09 DIAGNOSIS — G928 Other toxic encephalopathy: Principal | ICD-10-CM | POA: Diagnosis present

## 2022-09-09 DIAGNOSIS — I959 Hypotension, unspecified: Secondary | ICD-10-CM | POA: Diagnosis not present

## 2022-09-09 DIAGNOSIS — F319 Bipolar disorder, unspecified: Secondary | ICD-10-CM | POA: Diagnosis present

## 2022-09-09 DIAGNOSIS — Z930 Tracheostomy status: Secondary | ICD-10-CM

## 2022-09-09 DIAGNOSIS — T426X5A Adverse effect of other antiepileptic and sedative-hypnotic drugs, initial encounter: Secondary | ICD-10-CM | POA: Diagnosis present

## 2022-09-09 DIAGNOSIS — R400 Somnolence: Secondary | ICD-10-CM | POA: Diagnosis not present

## 2022-09-09 DIAGNOSIS — Z6841 Body Mass Index (BMI) 40.0 and over, adult: Secondary | ICD-10-CM | POA: Diagnosis not present

## 2022-09-09 DIAGNOSIS — N179 Acute kidney failure, unspecified: Secondary | ICD-10-CM | POA: Diagnosis present

## 2022-09-09 DIAGNOSIS — J9621 Acute and chronic respiratory failure with hypoxia: Secondary | ICD-10-CM | POA: Diagnosis present

## 2022-09-09 DIAGNOSIS — Z885 Allergy status to narcotic agent status: Secondary | ICD-10-CM

## 2022-09-09 DIAGNOSIS — E119 Type 2 diabetes mellitus without complications: Secondary | ICD-10-CM | POA: Diagnosis present

## 2022-09-09 DIAGNOSIS — R4182 Altered mental status, unspecified: Secondary | ICD-10-CM | POA: Diagnosis present

## 2022-09-09 DIAGNOSIS — J9611 Chronic respiratory failure with hypoxia: Secondary | ICD-10-CM | POA: Diagnosis not present

## 2022-09-09 DIAGNOSIS — Z86711 Personal history of pulmonary embolism: Secondary | ICD-10-CM

## 2022-09-09 LAB — COMPREHENSIVE METABOLIC PANEL
ALT: 11 U/L (ref 0–44)
AST: 22 U/L (ref 15–41)
Albumin: 2.4 g/dL — ABNORMAL LOW (ref 3.5–5.0)
Alkaline Phosphatase: 54 U/L (ref 38–126)
Anion gap: 11 (ref 5–15)
BUN: 30 mg/dL — ABNORMAL HIGH (ref 6–20)
CO2: 33 mmol/L — ABNORMAL HIGH (ref 22–32)
Calcium: 9.1 mg/dL (ref 8.9–10.3)
Chloride: 97 mmol/L — ABNORMAL LOW (ref 98–111)
Creatinine, Ser: 1.21 mg/dL — ABNORMAL HIGH (ref 0.44–1.00)
GFR, Estimated: 52 mL/min — ABNORMAL LOW (ref >60)
Glucose, Bld: 133 mg/dL — ABNORMAL HIGH (ref 70–99)
Potassium: 3.7 mmol/L (ref 3.5–5.1)
Sodium: 141 mmol/L (ref 135–145)
Total Bilirubin: 0.4 mg/dL (ref 0.3–1.2)
Total Protein: 7.3 g/dL (ref 6.5–8.1)

## 2022-09-09 LAB — AMMONIA: Ammonia: 19 umol/L (ref 9–35)

## 2022-09-09 LAB — I-STAT VENOUS BLOOD GAS, ED
Acid-Base Excess: 11 mmol/L — ABNORMAL HIGH (ref 0.0–2.0)
Bicarbonate: 36.7 mmol/L — ABNORMAL HIGH (ref 20.0–28.0)
Calcium, Ion: 1.19 mmol/L (ref 1.15–1.40)
HCT: 35 % — ABNORMAL LOW (ref 36.0–46.0)
Hemoglobin: 11.9 g/dL — ABNORMAL LOW (ref 12.0–15.0)
O2 Saturation: 77 %
Potassium: 3.7 mmol/L (ref 3.5–5.1)
Sodium: 141 mmol/L (ref 135–145)
TCO2: 38 mmol/L — ABNORMAL HIGH (ref 22–32)
pCO2, Ven: 55.4 mmHg (ref 44–60)
pH, Ven: 7.429 (ref 7.25–7.43)
pO2, Ven: 42 mmHg (ref 32–45)

## 2022-09-09 LAB — URINALYSIS, ROUTINE W REFLEX MICROSCOPIC
Bilirubin Urine: NEGATIVE
Glucose, UA: NEGATIVE mg/dL
Ketones, ur: 5 mg/dL — AB
Leukocytes,Ua: NEGATIVE
Nitrite: NEGATIVE
Protein, ur: NEGATIVE mg/dL
Specific Gravity, Urine: 1.023 (ref 1.005–1.030)
pH: 5 (ref 5.0–8.0)

## 2022-09-09 LAB — TSH: TSH: 2.129 u[IU]/mL (ref 0.350–4.500)

## 2022-09-09 LAB — CBC
HCT: 36.6 % (ref 36.0–46.0)
Hemoglobin: 11.3 g/dL — ABNORMAL LOW (ref 12.0–15.0)
MCH: 28 pg (ref 26.0–34.0)
MCHC: 30.9 g/dL (ref 30.0–36.0)
MCV: 90.6 fL (ref 80.0–100.0)
Platelets: 141 10*3/uL — ABNORMAL LOW (ref 150–400)
RBC: 4.04 MIL/uL (ref 3.87–5.11)
RDW: 16.6 % — ABNORMAL HIGH (ref 11.5–15.5)
WBC: 7.9 10*3/uL (ref 4.0–10.5)
nRBC: 0 % (ref 0.0–0.2)

## 2022-09-09 LAB — RESP PANEL BY RT-PCR (RSV, FLU A&B, COVID)  RVPGX2
Influenza A by PCR: NEGATIVE
Influenza B by PCR: NEGATIVE
Resp Syncytial Virus by PCR: NEGATIVE
SARS Coronavirus 2 by RT PCR: NEGATIVE

## 2022-09-09 LAB — TROPONIN I (HIGH SENSITIVITY)
Troponin I (High Sensitivity): 16 ng/L (ref <18)
Troponin I (High Sensitivity): 19 ng/L — ABNORMAL HIGH (ref <18)

## 2022-09-09 LAB — D-DIMER, QUANTITATIVE: D-Dimer, Quant: 1.09 ug/mL-FEU — ABNORMAL HIGH (ref 0.00–0.50)

## 2022-09-09 LAB — GLUCOSE, CAPILLARY
Glucose-Capillary: 109 mg/dL — ABNORMAL HIGH (ref 70–99)
Glucose-Capillary: 129 mg/dL — ABNORMAL HIGH (ref 70–99)

## 2022-09-09 LAB — MAGNESIUM: Magnesium: 2.1 mg/dL (ref 1.7–2.4)

## 2022-09-09 LAB — BRAIN NATRIURETIC PEPTIDE: B Natriuretic Peptide: 87.5 pg/mL (ref 0.0–100.0)

## 2022-09-09 MED ORDER — IOHEXOL 350 MG/ML SOLN
75.0000 mL | Freq: Once | INTRAVENOUS | Status: AC | PRN
  Administered 2022-09-09: 75 mL via INTRAVENOUS

## 2022-09-09 MED ORDER — ORAL CARE MOUTH RINSE
15.0000 mL | OROMUCOSAL | Status: DC
  Administered 2022-09-10 – 2022-09-12 (×23): 15 mL via OROMUCOSAL

## 2022-09-09 MED ORDER — IPRATROPIUM-ALBUTEROL 0.5-2.5 (3) MG/3ML IN SOLN
3.0000 mL | Freq: Four times a day (QID) | RESPIRATORY_TRACT | Status: DC
  Administered 2022-09-09 – 2022-09-10 (×2): 3 mL via RESPIRATORY_TRACT
  Filled 2022-09-09 (×2): qty 3

## 2022-09-09 MED ORDER — FAMOTIDINE 20 MG PO TABS
20.0000 mg | ORAL_TABLET | Freq: Two times a day (BID) | ORAL | Status: DC
  Administered 2022-09-10: 20 mg
  Filled 2022-09-09 (×2): qty 1

## 2022-09-09 MED ORDER — CHLORHEXIDINE GLUCONATE CLOTH 2 % EX PADS
6.0000 | MEDICATED_PAD | Freq: Every day | CUTANEOUS | Status: DC
  Administered 2022-09-09: 6 via TOPICAL

## 2022-09-09 MED ORDER — ORAL CARE MOUTH RINSE: 15.0000 mL | OROMUCOSAL | Status: DC | PRN

## 2022-09-09 MED ORDER — DOCUSATE SODIUM 100 MG PO CAPS: 100.0000 mg | ORAL_CAPSULE | Freq: Two times a day (BID) | ORAL | Status: DC | PRN

## 2022-09-09 MED ORDER — POLYETHYLENE GLYCOL 3350 17 G PO PACK: 17.0000 g | PACK | Freq: Every day | ORAL | Status: DC | PRN

## 2022-09-09 NOTE — ED Provider Notes (Signed)
5:41 PM Workup overall unremarkable.  Only thing pertinent is some consolidation in her lungs on the CTA.  No PE.  Not febrile.  Discussed with ICU in consultation, they will come see.  Will add an ammonia.  Will order a tracheal aspirate.  Her blood pressures are soft but per chart review this seems about normal for her.  No fever.  Dr. Erin Fulling has seen patient and will admit.   Sherwood Gambler, MD 09/09/22 2038

## 2022-09-09 NOTE — ED Triage Notes (Signed)
Pt BIB [facility] from [facility] for an unresponsive episode at lunch time and low O2 sats.  Pt has a trach and facility had to place her on the vent.  Pt normally just uses vent at night.  Pt had a similar episode last week and was seen here for the same.  Pt awake on arrival but lethargic.

## 2022-09-09 NOTE — H&P (Signed)
NAME:  Kim Jenkins, MRN:  [id number], DOB:  12-29-1965, LOS: 0 ADMISSION DATE:  09/09/2022, CONSULTATION DATE:  09/09/22 REFERRING MD:  ED  CHIEF COMPLAINT: AMS     History of Present Illness:  Kim Jenkins is a 56 year old woman with history of obesity, chronic trach, bipolar disorder, DMII, and OSA/OHS who was brought to the ER from [facility] due to AMS. She reportedly had decreased mental status today and was on ventilator all day. She is usually on ventilator at night. She was in the ER on 12/10 for AMS and given augmentin for concern of pneumonia due to aspiration.   VBG showed pH 7.429. Serum chemistry showed chloride 97, serum bicarb 33, Cr 1.21. BNP 87 and trop 16. CBC shows hemoglobin 11.3 platelets 141.   CT head is without acute abnormality. CT Chest shows bibasilar dependent consolidation. Negative for PE.  Pertinent  Medical History   Past Medical History:  Diagnosis Date   Bipolar 1 disorder (Kim Jenkins)    Chronic pain    Depression    Diabetes mellitus (Kim Jenkins)    Hypertension    Kidney stones    Morbid obesity (Kim Jenkins)    Obesity hypoventilation syndrome (Kim Jenkins)    Obstructive sleep apnea    Panniculitis    Significant Hospital Events: Including procedures, antibiotic start and stop dates in addition to other pertinent events   12/23 admitted for AMS  Interim History / Subjective:  As above  Objective   Blood pressure 101/85, pulse 67, temperature 98.4 F (36.9 C), resp. rate 18, SpO2 97 %.    Vent Mode: PRVC FiO2 (%):  [40 %] 40 % Set Rate:  [18 bmp] 18 bmp Vt Set:  [500 mL] 500 mL PEEP:  [5 cmH20] 5 cmH20 Plateau Pressure:  [15 cmH20-30 cmH20] 15 cmH20  No intake or output data in the 24 hours ending 09/09/22 1835 There were no vitals filed for this visit.  Examination: General: obese, no acute distress, on vent HENT: [place]/AT, trach in place, erythema of neck and necks, no tenderness Lungs: diminished breath sounds, inspiratory/expiratory wheezes  scattered Cardiovascular: rrr, no murmurs Abdomen: non-tender, obese Extremities: warm, trace edema Neuro: awakes to verbal stimuli, follows simple commands GU: n/a  Resolved Hospital Problem list     Assessment & Plan:  Altered Mental Status - etiology is unknown at this time. Possible hypercapnea in setting of OHS vs polypharmacy vs psychiatric issue. Less likely seizures as lacking clinical history.  - check ammonia - CT head unremarkable. Low concern for infection at this time - Monitor closely  Acute on Chronic Hypoxemic Respiratory Failure Obesity Hypoventilation Syndrome/ Obstructive Sleep apnea - continue mechanical ventilatory support overnight - PEEP increased to 8 from 5 - low concern for infection at this time - duonebs q 6 hrs  History of PE - will check with pharmacy if she has been on Xarelto  DMII - SSI  Bipolar Disorder - hold home meds for now  Chronic Pain - hold narcotics for now  Best Practice (right click and "Reselect all SmartList Selections" daily)   Diet/type: NPO DVT prophylaxis:  GI prophylaxis: H2B Lines: Central line Foley:  N/A Code Status:  full code Last date of multidisciplinary goals of care discussion [n/a]  Labs   CBC: Recent Labs  Lab 09/09/22 1430 09/09/22 1445  WBC 7.9  --   HGB 11.3* 11.9*  HCT 36.6 35.0*  MCV 90.6  --   PLT 141*  --     Basic  Metabolic Panel: Recent Labs  Lab 09/09/22 1430 09/09/22 1445  NA 141 141  K 3.7 3.7  CL 97*  --   CO2 33*  --   GLUCOSE 133*  --   BUN 30*  --   CREATININE 1.21*  --   CALCIUM 9.1  --   MG 2.1  --    GFR: CrCl cannot be calculated (Unknown ideal weight.). Recent Labs  Lab 09/09/22 1430  WBC 7.9    Liver Function Tests: Recent Labs  Lab 09/09/22 1430  AST 22  ALT 11  ALKPHOS 54  BILITOT 0.4  PROT 7.3  ALBUMIN 2.4*   No results for input(s): "LIPASE", "AMYLASE" in the last 168 hours. No results for input(s): "AMMONIA" in the last 168  hours.  ABG    Component Value Date/Time   PHART 7.508 (H) 07/30/2022 2145   PCO2ART 46.4 07/30/2022 2145   PO2ART 163 (H) 07/30/2022 2145   HCO3 36.7 (H) 09/09/2022 1445   TCO2 38 (H) 09/09/2022 1445   O2SAT 77 09/09/2022 1445     Coagulation Profile: No results for input(s): "INR", "PROTIME" in the last 168 hours.  Cardiac Enzymes: No results for input(s): "CKTOTAL", "CKMB", "CKMBINDEX", "TROPONINI" in the last 168 hours.  HbA1C: Hgb A1c MFr Bld  Date/Time Value Ref Range Status  12/05/2019 06:15 AM 5.2 4.8 - 5.6 % Final    Comment:    (NOTE) Pre diabetes:          5.7%-6.4% Diabetes:              >6.4% Glycemic control for   <7.0% adults with diabetes     CBG: No results for input(s): "GLUCAP" in the last 168 hours.  Review of Systems:   Unable to obtain complete ROS due to mental status  Past Medical History:  She,  has a past medical history of Bipolar 1 disorder ([place]), Chronic pain, Depression, Diabetes mellitus (Kim Jenkins), Hypertension, Kidney stones, Morbid obesity (Kim Jenkins), Obesity hypoventilation syndrome (Kim Jenkins), Obstructive sleep apnea, and Panniculitis.   Surgical History:   Past Surgical History:  Procedure Laterality Date   ESOPHAGOGASTRODUODENOSCOPY N/A 05/06/2018   Procedure: ESOPHAGOGASTRODUODENOSCOPY (EGD);  Surgeon: Irving Copas., MD;  Location: [facility];  Service: Gastroenterology;  Laterality: N/A;   RIGHT OOPHORECTOMY     TRACHEOSTOMY     VENTRAL HERNIA REPAIR       Social History:   reports that she has never smoked. She has never used smokeless tobacco. She reports that she does not drink alcohol and does not use drugs.   Family History:  Her family history is not on file.   Allergies Allergies  Allergen Reactions   Meperidine And Related Shortness Of Breath and Other (See Comments)    "Allergic," per [facility]   Cefazolin Other (See Comments)    "Allergic," per [facility]     Home Medications  Prior to Admission medications    Medication Sig Start Date End Date Taking? Authorizing Provider  acetaminophen (TYLENOL) 325 MG tablet Take 650 mg by mouth every 6 (six) hours as needed for mild pain.   Yes [provider]  dextromethorphan-guaiFENesin (ROBITUSSIN-DM) 10-100 MG/5ML liquid Take 5 mLs by mouth every 4 (four) hours as needed for cough.   Yes [provider]  diazepam (VALIUM) 2 MG tablet Take 2 mg by mouth 2 (two) times daily. 07/07/20  Yes [provider]  diclofenac (VOLTAREN) 75 MG EC tablet Take 75 mg by mouth 2 (two) times daily. 08/02/22  Yes [provider]  diphenhydrAMINE (BENADRYL) 25 mg capsule Take 25 mg by mouth every 6 (six) hours as needed for allergies.   Yes [provider]  divalproex (DEPAKOTE ER) 500 MG 24 hr tablet Take 2 tablets (1,000 mg total) by mouth every 12 (twelve) hours. 09/13/18  Yes Ward, Ozella Almond, PA-C  erythromycin ophthalmic ointment Place 1 Application into both eyes 4 (four) times daily.   Yes [provider]  famotidine (PEPCID) 40 MG tablet Take 40 mg by mouth 2 (two) times daily.   Yes [provider]  fentaNYL (DURAGESIC) 25 MCG/HR Place 1 patch onto the skin every 3 (three) days. 08/03/22  Yes [provider]  FLUoxetine (PROZAC) 20 MG capsule Take 20 mg by mouth daily. 08/25/22  Yes [provider]  fluticasone (FLONASE) 50 MCG/ACT nasal spray Place 2 sprays into both nostrils daily.    Yes [provider]  gabapentin (NEURONTIN) 300 MG capsule Take 300 mg by mouth 2 (two) times daily. 08/11/22  Yes [provider]  hydrALAZINE (APRESOLINE) 50 MG tablet Take 50 mg by mouth every 6 (six) hours as needed (SBP > 160 and DPB > 100).   Yes [provider]  HYDROcodone-acetaminophen (NORCO/VICODIN) 5-325 MG tablet Take 1 tablet by mouth 2 (two) times daily as needed for moderate pain.   Yes [provider]  loperamide (IMODIUM A-D) 2 MG tablet Take 2 mg by mouth  every 6 (six) hours as needed for diarrhea or loose stools.    Yes [provider]  moxifloxacin (VIGAMOX) 0.5 % ophthalmic solution Place 1 drop into both eyes in the morning, at noon, in the evening, and at bedtime.   Yes [provider]  Multiple Vitamin (MULTIVITAMIN WITH MINERALS) TABS tablet Take 1 tablet by mouth daily.   Yes [provider]  nitroGLYCERIN (NITROSTAT) 0.4 MG SL tablet Place 0.4 mg under the tongue every 5 (five) minutes x 3 doses as needed for chest pain (AND CALL PROVIDER).    Yes [provider]  olopatadine (PATANOL) 0.1 % ophthalmic solution Place 1 drop into both eyes 2 (two) times daily.   Yes [provider]  omeprazole (PRILOSEC) 40 MG capsule Take 40 mg by mouth daily. 08/12/22  Yes [provider]  ondansetron (ZOFRAN-ODT) 4 MG disintegrating tablet Take 4 mg by mouth every 6 (six) hours as needed for nausea or vomiting (DISSOLVE ORALLY).   Yes [provider]  polyethylene glycol powder (GLYCOLAX/MIRALAX) 17 GM/SCOOP powder Take 1 Container by mouth every 12 (twelve) hours as needed for mild constipation.   Yes [provider]  Propylene Glycol (SYSTANE BALANCE) 0.6 % SOLN Place 1 drop into both eyes every 6 (six) hours.    Yes [provider]  sodium chloride (OCEAN) 0.65 % SOLN nasal spray Place 2 sprays into both nostrils 2 (two) times daily as needed for congestion.   Yes [provider]  torsemide (DEMADEX) 20 MG tablet Take 20 mg by mouth daily.   Yes [provider]  traZODone (DESYREL) 150 MG tablet Take 1 tablet (150 mg total) by mouth at bedtime. Patient taking differently: Take 50 mg by mouth at bedtime. 09/13/18  Yes Ward, Ozella Almond, PA-C  venlafaxine XR (EFFEXOR-XR) 75 MG 24 hr capsule Take 75 mg by mouth daily with breakfast.   Yes [provider]  VRAYLAR 4.5 MG CAPS Take 4.5 mg by mouth daily. 08/16/22  Yes [provider]   amoxicillin-clavulanate (AUGMENTIN) 875-125  MG tablet Take 1 tablet by mouth every 12 (twelve) hours. Patient not taking: Reported on 09/09/2022 08/27/22   Hayden Rasmussen, MD  ARIPiprazole (ABILIFY) 5 MG tablet Take 1 tablet (5 mg total) by mouth daily. Patient not taking: Reported on 07/29/2020 09/13/18   Ward, Ozella Almond, PA-C  Rivaroxaban (XARELTO) 15 MG TABS tablet Take 1 tablet (15 mg total) by mouth 2 (two) times daily. Patient not taking: Reported on 07/29/2020 12/09/19   Erick Colace, NP  rivaroxaban (XARELTO) 20 MG TABS tablet Take 1 tablet (20 mg total) by mouth daily. Patient not taking: Reported on 07/29/2020 12/28/19   Erick Colace, NP  sodium chloride flush (NS) 0.9 % SOLN 10-40 mLs by Intracatheter route as needed (flush). Patient not taking: Reported on 09/09/2022 12/09/19   Erick Colace, NP     Critical care time: 76 minutes    Freda Jackson, MD [facility] Office: [phone]   See Amion for personal pager PCCM on call pager [phone] until 7pm. Please call [facility] 7p-7a. [phone]

## 2022-09-09 NOTE — Progress Notes (Signed)
Pt transported to CT and back to room on full vent support. No complications noted.

## 2022-09-09 NOTE — ED Notes (Signed)
Per verbal order from Dr. Doren Custard, ok to use PICC line in right arm.

## 2022-09-09 NOTE — Progress Notes (Signed)
Pt transported to 1H08 without complications

## 2022-09-09 NOTE — ED Notes (Signed)
Pt to CT scan with respiratory therapist and on monitor.  No issues during transport.

## 2022-09-09 NOTE — ED Provider Notes (Signed)
[facility] EMERGENCY DEPARTMENT Provider Note   CSN: [id number] Arrival date & time: 09/09/22  1415     History  Chief Complaint  Patient presents with   Altered Mental Status    Kim Jenkins is a 56 y.o. female.  HPI Patient presents for possible syncopal episode, somnolence and increased respiratory support requirement.  Medical history includes chronic respiratory failure, tracheostomy, morbid obesity, DM2, bipolar disorder, HTN. She has had chronic hypoxic hyper apneic respiratory failure for the past 8 years.  This is secondary to obesity hypoventilation and obstructive sleep apnea.  At baseline, she is ventilator dependent at nighttime and on speaking valve during the day.  He currently resides in [facility].  Today, she has had decreased responsiveness and has been ventilator dependent during the day.  There was a questionable syncopal episode that occurred around midday.  Transport he reports that she is typically interactive and is not back to her mental baseline.  Per chart review, she was seen in the ED 2 weeks ago for altered mental status.  She was prescribed Augmentin for suspicion of chronic aspiration.    Home Medications Prior to Admission medications   Medication Sig Start Date End Date Taking? Authorizing Provider  acetaminophen (TYLENOL) 325 MG tablet Take 650 mg by mouth every 6 (six) hours as needed for mild pain.   Yes [provider]  dextromethorphan-guaiFENesin (ROBITUSSIN-DM) 10-100 MG/5ML liquid Take 5 mLs by mouth every 4 (four) hours as needed for cough.   Yes [provider]  diazepam (VALIUM) 2 MG tablet Take 2 mg by mouth 2 (two) times daily. 07/07/20  Yes [provider]  diclofenac (VOLTAREN) 75 MG EC tablet Take 75 mg by mouth 2 (two) times daily. 08/02/22  Yes [provider]  diphenhydrAMINE (BENADRYL) 25 mg capsule Take 25 mg by mouth every 6 (six) hours as needed for allergies.   Yes  [provider]  divalproex (DEPAKOTE ER) 500 MG 24 hr tablet Take 2 tablets (1,000 mg total) by mouth every 12 (twelve) hours. 09/13/18  Yes Ward, Ozella Almond, PA-C  erythromycin ophthalmic ointment Place 1 Application into both eyes 4 (four) times daily.   Yes [provider]  famotidine (PEPCID) 40 MG tablet Take 40 mg by mouth 2 (two) times daily.   Yes [provider]  fentaNYL (DURAGESIC) 25 MCG/HR Place 1 patch onto the skin every 3 (three) days. 08/03/22  Yes [provider]  FLUoxetine (PROZAC) 20 MG capsule Take 20 mg by mouth daily. 08/25/22  Yes [provider]  fluticasone (FLONASE) 50 MCG/ACT nasal spray Place 2 sprays into both nostrils daily.    Yes [provider]  gabapentin (NEURONTIN) 300 MG capsule Take 300 mg by mouth 2 (two) times daily. 08/11/22  Yes [provider]  hydrALAZINE (APRESOLINE) 50 MG tablet Take 50 mg by mouth every 6 (six) hours as needed (SBP > 160 and DPB > 100).   Yes [provider]  HYDROcodone-acetaminophen (NORCO/VICODIN) 5-325 MG tablet Take 1 tablet by mouth 2 (two) times daily as needed for moderate pain.   Yes [provider]  loperamide (IMODIUM A-D) 2 MG tablet Take 2 mg by mouth every 6 (six) hours as needed for diarrhea or loose stools.    Yes [provider]  moxifloxacin (VIGAMOX) 0.5 % ophthalmic solution Place 1 drop into both eyes in the morning, at noon, in the evening, and at bedtime.   Yes [provider]  Multiple  Vitamin (MULTIVITAMIN WITH MINERALS) TABS tablet Take 1 tablet by mouth daily.   Yes [provider]  nitroGLYCERIN (NITROSTAT) 0.4 MG SL tablet Place 0.4 mg under the tongue every 5 (five) minutes x 3 doses as needed for chest pain (AND CALL PROVIDER).    Yes [provider]  olopatadine (PATANOL) 0.1 % ophthalmic solution Place 1 drop into both eyes 2 (two) times daily.   Yes [provider]  omeprazole  (PRILOSEC) 40 MG capsule Take 40 mg by mouth daily. 08/12/22  Yes [provider]  ondansetron (ZOFRAN-ODT) 4 MG disintegrating tablet Take 4 mg by mouth every 6 (six) hours as needed for nausea or vomiting (DISSOLVE ORALLY).   Yes [provider]  polyethylene glycol powder (GLYCOLAX/MIRALAX) 17 GM/SCOOP powder Take 1 Container by mouth every 12 (twelve) hours as needed for mild constipation.   Yes [provider]  Propylene Glycol (SYSTANE BALANCE) 0.6 % SOLN Place 1 drop into both eyes every 6 (six) hours.    Yes [provider]  sodium chloride (OCEAN) 0.65 % SOLN nasal spray Place 2 sprays into both nostrils 2 (two) times daily as needed for congestion.   Yes [provider]  torsemide (DEMADEX) 20 MG tablet Take 20 mg by mouth daily.   Yes [provider]  traZODone (DESYREL) 150 MG tablet Take 1 tablet (150 mg total) by mouth at bedtime. Patient taking differently: Take 50 mg by mouth at bedtime. 09/13/18  Yes Ward, Ozella Almond, PA-C  venlafaxine XR (EFFEXOR-XR) 75 MG 24 hr capsule Take 75 mg by mouth daily with breakfast.   Yes [provider]  VRAYLAR 4.5 MG CAPS Take 4.5 mg by mouth daily. 08/16/22  Yes [provider]  amoxicillin-clavulanate (AUGMENTIN) 875-125 MG tablet Take 1 tablet by mouth every 12 (twelve) hours. Patient not taking: Reported on 09/09/2022 08/27/22   Hayden Rasmussen, MD  ARIPiprazole (ABILIFY) 5 MG tablet Take 1 tablet (5 mg total) by mouth daily. Patient not taking: Reported on 07/29/2020 09/13/18   Ward, Ozella Almond, PA-C  Rivaroxaban (XARELTO) 15 MG TABS tablet Take 1 tablet (15 mg total) by mouth 2 (two) times daily. Patient not taking: Reported on 07/29/2020 12/09/19   Erick Colace, NP  rivaroxaban (XARELTO) 20 MG TABS tablet Take 1 tablet (20 mg total) by mouth daily. Patient not taking: Reported on 07/29/2020 12/28/19   Erick Colace, NP  sodium chloride flush (NS) 0.9 % SOLN  10-40 mLs by Intracatheter route as needed (flush). Patient not taking: Reported on 09/09/2022 12/09/19   Erick Colace, NP      Allergies    Meperidine and related and Cefazolin    Review of Systems   Review of Systems  Unable to perform ROS: Other (On ventilator)    Physical Exam Updated Vital Signs BP 104/80   Pulse 71   Temp 98.4 F (36.9 C)   Resp 18   SpO2 98%  Physical Exam Constitutional:      General: She is not in acute distress.    Appearance: She is obese. She is ill-appearing (Chronically). She is not toxic-appearing or diaphoretic.  HENT:     Head: Normocephalic.     Right Ear: External ear normal.     Left Ear: External ear normal.     Nose: Nose normal.  Eyes:     Extraocular Movements: Extraocular movements intact.  Cardiovascular:     Rate and Rhythm: Normal rate and regular rhythm.  Pulmonary:  Breath sounds: Rhonchi present. No wheezing or rales.     Comments: Tracheostomy present.  On ventilator. Abdominal:     General: There is no distension.     Tenderness: There is no abdominal tenderness.  Musculoskeletal:        General: No tenderness or deformity.     Cervical back: No rigidity.  Skin:    General: Skin is warm and dry.  Neurological:     Mental Status: She is alert.     Motor: Weakness (Global) present.     ED Results / Procedures / Treatments   Labs (all labs ordered are listed, but only abnormal results are displayed) Labs Reviewed  COMPREHENSIVE METABOLIC PANEL - Abnormal; Notable for the following components:      Result Value   Chloride 97 (*)    CO2 33 (*)    Glucose, Bld 133 (*)    BUN 30 (*)    Creatinine, Ser 1.21 (*)    Albumin 2.4 (*)    GFR, Estimated 52 (*)    All other components within normal limits  CBC - Abnormal; Notable for the following components:   Hemoglobin 11.3 (*)    RDW 16.6 (*)    Platelets 141 (*)    All other components within normal limits  D-DIMER, QUANTITATIVE - Abnormal; Notable for  the following components:   D-Dimer, Quant 1.09 (*)    All other components within normal limits  I-STAT VENOUS BLOOD GAS, ED - Abnormal; Notable for the following components:   Bicarbonate 36.7 (*)    TCO2 38 (*)    Acid-Base Excess 11.0 (*)    HCT 35.0 (*)    Hemoglobin 11.9 (*)    All other components within normal limits  RESP PANEL BY RT-PCR (RSV, FLU A&B, COVID)  RVPGX2  URINE CULTURE  BRAIN NATRIURETIC PEPTIDE  MAGNESIUM  URINALYSIS, ROUTINE W REFLEX MICROSCOPIC  TROPONIN I (HIGH SENSITIVITY)  TROPONIN I (HIGH SENSITIVITY)    EKG EKG Interpretation  Date/Time:  Saturday September 09 2022 14:29:30 EST Ventricular Rate:  77 PR Interval:  126 QRS Duration: 77 QT Interval:  382 QTC Calculation: 433 R Axis:   73 Text Interpretation: Sinus rhythm Atrial premature complexes Low voltage, precordial leads Nonspecific repol abnormality, diffuse leads Confirmed by Godfrey Pick [phone]) on 09/09/2022 3:16:00 PM  Radiology DG Chest Port 1 View  Result Date: 09/09/2022 CLINICAL DATA:  Increased ventilator requirement, hypoxia EXAM: PORTABLE CHEST 1 VIEW COMPARISON:  08/27/2022 FINDINGS: Single frontal view of the chest demonstrates stable tracheostomy tube and right-sided PICC. Cardiac silhouette is unremarkable. No acute airspace disease, effusion, or pneumothorax. Minimal linear consolidation left lung base likely hypoventilatory. No acute bony abnormality. IMPRESSION: 1. Minimal left basilar atelectasis.  No acute airspace disease. 2. Stable support devices. Electronically Signed   By: Randa Ngo M.D.   On: 09/09/2022 14:57    Procedures Procedures    Medications Ordered in ED Medications - No data to display  ED Course/ Medical Decision Making/ A&P                           Medical Decision Making Amount and/or Complexity of Data Reviewed Labs: ordered. Radiology: ordered.   This patient presents to the ED for concern of altered mental status, this involves an  extensive number of treatment options, and is a complaint that carries with it a high risk of complications and morbidity.  The differential diagnosis includes polypharmacy, seizure,  CVA, TIA, hypercapnia, infection, hypoglycemia, other metabolic derangements   Co morbidities that complicate the patient evaluation  chronic respiratory failure, tracheostomy, morbid obesity, DM2, bipolar disorder, HTN   Additional history obtained:  Additional history obtained from transport team External records from outside source obtained and reviewed including EMR   Lab Tests:  I Ordered, and personally interpreted labs.  The pertinent results include: Baseline creatinine, increased bicarb from baseline, baseline anemia, no leukocytosis, mild elevation in D-dimer, normal BNP, normal troponin   Imaging Studies ordered:  I ordered imaging studies including chest x-ray, CT head, CTA chest I independently visualized and interpreted imaging which showed no acute findings on chest x-ray; CT imaging pending at time of signout. I agree with the radiologist interpretation   Cardiac Monitoring: / EKG:  The patient was maintained on a cardiac monitor.  I personally viewed and interpreted the cardiac monitored which showed an underlying rhythm of: Sinus rhythm   Problem List / ED Course / Critical interventions / Medication management  Patient presents for altered mental status and concern of difficulty breathing.  At baseline, she is ventilator dependent at night only.  Today, she has been ventilator dependent during the day.  She arrives from [facility] with concerns of altered mental status.  History on arrival is very limited.  Patient is awake and alert.  She is able to answer basic questions with yes or no.  When asked about her pain, she does point to her chest.  She appears to have global weakness on exam.  Broad diagnostic workup was initiated.  On lab work, patient does have a mildly elevated  D-dimer.  Troponin is normal.  She has hypercarbia on blood gas which is compensated.  This elevation in bicarb does appear to be new when compared to prior lab work.  At time of signout, CT imaging is pending.  Care of patient was signed out to oncoming ED provider.   Social Determinants of Health:  Resides at [facility], [facility]         Final Clinical Impression(s) / ED Diagnoses Final diagnoses:  Syncope, unspecified syncope type    Rx / DC Orders ED Discharge Orders     None         Godfrey Pick, MD 09/09/22 1616

## 2022-09-10 DIAGNOSIS — J9611 Chronic respiratory failure with hypoxia: Secondary | ICD-10-CM

## 2022-09-10 DIAGNOSIS — E662 Morbid (severe) obesity with alveolar hypoventilation: Secondary | ICD-10-CM

## 2022-09-10 LAB — POCT I-STAT 7, (LYTES, BLD GAS, ICA,H+H)
Acid-Base Excess: 10 mmol/L — ABNORMAL HIGH (ref 0.0–2.0)
Bicarbonate: 34.8 mmol/L — ABNORMAL HIGH (ref 20.0–28.0)
Calcium, Ion: 1.24 mmol/L (ref 1.15–1.40)
HCT: 35 % — ABNORMAL LOW (ref 36.0–46.0)
Hemoglobin: 11.9 g/dL — ABNORMAL LOW (ref 12.0–15.0)
O2 Saturation: 98 %
Patient temperature: 98.2
Potassium: 3.8 mmol/L (ref 3.5–5.1)
Sodium: 142 mmol/L (ref 135–145)
TCO2: 36 mmol/L — ABNORMAL HIGH (ref 22–32)
pCO2 arterial: 48.1 mmHg — ABNORMAL HIGH (ref 32–48)
pH, Arterial: 7.466 — ABNORMAL HIGH (ref 7.35–7.45)
pO2, Arterial: 94 mmHg (ref 83–108)

## 2022-09-10 LAB — CBC
HCT: 35.1 % — ABNORMAL LOW (ref 36.0–46.0)
Hemoglobin: 10.6 g/dL — ABNORMAL LOW (ref 12.0–15.0)
MCH: 27.4 pg (ref 26.0–34.0)
MCHC: 30.2 g/dL (ref 30.0–36.0)
MCV: 90.7 fL (ref 80.0–100.0)
Platelets: 146 10*3/uL — ABNORMAL LOW (ref 150–400)
RBC: 3.87 MIL/uL (ref 3.87–5.11)
RDW: 16.9 % — ABNORMAL HIGH (ref 11.5–15.5)
WBC: 7.3 10*3/uL (ref 4.0–10.5)
nRBC: 0 % (ref 0.0–0.2)

## 2022-09-10 LAB — BASIC METABOLIC PANEL
Anion gap: 9 (ref 5–15)
BUN: 28 mg/dL — ABNORMAL HIGH (ref 6–20)
CO2: 33 mmol/L — ABNORMAL HIGH (ref 22–32)
Calcium: 9.2 mg/dL (ref 8.9–10.3)
Chloride: 98 mmol/L (ref 98–111)
Creatinine, Ser: 1.11 mg/dL — ABNORMAL HIGH (ref 0.44–1.00)
GFR, Estimated: 58 mL/min — ABNORMAL LOW (ref >60)
Glucose, Bld: 119 mg/dL — ABNORMAL HIGH (ref 70–99)
Potassium: 3.5 mmol/L (ref 3.5–5.1)
Sodium: 140 mmol/L (ref 135–145)

## 2022-09-10 LAB — GLUCOSE, CAPILLARY
Glucose-Capillary: 119 mg/dL — ABNORMAL HIGH (ref 70–99)
Glucose-Capillary: 122 mg/dL — ABNORMAL HIGH (ref 70–99)
Glucose-Capillary: 128 mg/dL — ABNORMAL HIGH (ref 70–99)
Glucose-Capillary: 132 mg/dL — ABNORMAL HIGH (ref 70–99)
Glucose-Capillary: 134 mg/dL — ABNORMAL HIGH (ref 70–99)
Glucose-Capillary: 153 mg/dL — ABNORMAL HIGH (ref 70–99)

## 2022-09-10 LAB — HIV ANTIBODY (ROUTINE TESTING W REFLEX): HIV Screen 4th Generation wRfx: NONREACTIVE

## 2022-09-10 LAB — MRSA NEXT GEN BY PCR, NASAL: MRSA by PCR Next Gen: DETECTED — AB

## 2022-09-10 MED ORDER — ZINC OXIDE 12.8 % EX OINT
TOPICAL_OINTMENT | Freq: Two times a day (BID) | CUTANEOUS | Status: DC
  Filled 2022-09-10 (×3): qty 56.7

## 2022-09-10 MED ORDER — IPRATROPIUM-ALBUTEROL 0.5-2.5 (3) MG/3ML IN SOLN: 3.0000 mL | Freq: Two times a day (BID) | RESPIRATORY_TRACT | Status: DC

## 2022-09-10 MED ORDER — VENLAFAXINE HCL ER 75 MG PO CP24
75.0000 mg | ORAL_CAPSULE | Freq: Every day | ORAL | Status: DC
  Filled 2022-09-10: qty 1

## 2022-09-10 MED ORDER — TRAZODONE HCL 50 MG PO TABS
100.0000 mg | ORAL_TABLET | Freq: Every day | ORAL | Status: DC
  Filled 2022-09-10: qty 2

## 2022-09-10 MED ORDER — ALBUMIN HUMAN 25 % IV SOLN
25.0000 g | Freq: Once | INTRAVENOUS | Status: AC
  Administered 2022-09-10: 25 g via INTRAVENOUS
  Filled 2022-09-10: qty 100

## 2022-09-10 MED ORDER — POTASSIUM CHLORIDE 10 MEQ/50ML IV SOLN
10.0000 meq | INTRAVENOUS | Status: AC
  Administered 2022-09-10 (×4): 10 meq via INTRAVENOUS
  Filled 2022-09-10 (×4): qty 50

## 2022-09-10 MED ORDER — FAMOTIDINE IN NACL 20-0.9 MG/50ML-% IV SOLN
20.0000 mg | Freq: Two times a day (BID) | INTRAVENOUS | Status: DC
  Administered 2022-09-10 – 2022-09-12 (×4): 20 mg via INTRAVENOUS
  Filled 2022-09-10 (×5): qty 50

## 2022-09-10 MED ORDER — TORSEMIDE 20 MG PO TABS
20.0000 mg | ORAL_TABLET | Freq: Every day | ORAL | Status: DC
  Administered 2022-09-10: 20 mg via ORAL
  Filled 2022-09-10: qty 1

## 2022-09-10 MED ORDER — IPRATROPIUM-ALBUTEROL 0.5-2.5 (3) MG/3ML IN SOLN: 3.0000 mL | Freq: Four times a day (QID) | RESPIRATORY_TRACT | Status: DC | PRN

## 2022-09-10 MED ORDER — CARIPRAZINE HCL 1.5 MG PO CAPS
4.5000 mg | ORAL_CAPSULE | Freq: Every day | ORAL | Status: DC
  Filled 2022-09-10 (×3): qty 3

## 2022-09-10 MED ORDER — NOREPINEPHRINE 4 MG/250ML-% IV SOLN
0.0000 ug/min | INTRAVENOUS | Status: DC
  Administered 2022-09-10: 2 ug/min via INTRAVENOUS
  Filled 2022-09-10: qty 250

## 2022-09-10 MED ORDER — CHLORHEXIDINE GLUCONATE CLOTH 2 % EX PADS
6.0000 | MEDICATED_PAD | Freq: Every day | CUTANEOUS | Status: DC
  Administered 2022-09-10 – 2022-09-11 (×2): 6 via TOPICAL

## 2022-09-10 NOTE — Progress Notes (Signed)
[place] Progress Note Patient Name: Kim Jenkins DOB: [birth year] MRN: [id number]   Date of Service  09/10/2022  HPI/Events of Note  Hypotension - BP = 74/50 with MAP = 59. HR = 53. Albumin = 2.4.  eICU Interventions  Plan: Monitor CVP now and Q 4 hours. 25% Albumin 25 gm IV now. Norepinephrine IV infusion. Titrate to MAP >= 65.     Intervention Category Major Interventions: Hypotension - evaluation and management  Tynisha Ogan Eugene 09/10/2022, 12:40 AM

## 2022-09-10 NOTE — Progress Notes (Signed)
[facility] ADULT ICU REPLACEMENT PROTOCOL   The patient does apply for the [facility] Adult ICU Electrolyte Replacment Protocol based on the criteria listed below:   1.Exclusion criteria: TCTS, ECMO, Dialysis, and Myasthenia Gravis patients 2. Is GFR >/= 30 ml/min? Yes.    Patient's GFR today is 58 3. Is SCr </= 2? Yes.   Patient's SCr is 1.11 mg/dL 4. Did SCr increase >/= 0.5 in 24 hours? No. 5.Pt's weight >40kg  Yes.   6. Abnormal electrolyte(s): K  7. Electrolytes replaced per protocol 8.  Call MD STAT for K+ </= 2.5, Phos </= 1, or Mag </= 1 Physician:  Olean Ree [facility] 09/10/2022 2:55 AM

## 2022-09-10 NOTE — Progress Notes (Signed)
[place] Progress Note Patient Name: Chessie Neuharth DOB: [birth year] MRN: [id number]   Date of Service  09/10/2022  HPI/Events of Note  EKG reveals sinus bradycardia with marked sinus arrhythmia, nonspecific T wave abnormality, and prolonged QT. HR = 50. HR is currently = 57. BP = 123/106.  eICU Interventions  Plan: Continue to observe HR. Consider low dose Dopamine IV infusion for HR consistently < 50.     Intervention Category Major Interventions: Arrhythmia - evaluation and management  Golden Emile Eugene 09/10/2022, 2:10 AM

## 2022-09-10 NOTE — Progress Notes (Signed)
SLP Cancellation Note  Patient Details Name: Kim Jenkins MRN: [id number] DOB: Mar 30, 1966   Cancelled treatment:       Reason Eval/Treat Not Completed: Patient not medically ready (Pt was on trach collar, but has recently been returned to the vent. SLP will follow up on a subsequent date unless contacted sooner.)  Farris Blash I. Hardin Negus, [place], [place] Office number [phone]  Horton Marshall 09/10/2022, 12:31 PM

## 2022-09-10 NOTE — Consult Note (Signed)
[place] Nurse Consult Note: Reason for Consult: pressure injuries Patient from LTAC, vent dependent, indwelling FC, mobid obesity  Wound type: bilateral buttocks with 2 areas of full thickness skin loss but no depth on the left buttock ICD (irritant contact dermatitis) ICD-10 CM Codes for Irritant Dermatitis L24A2 - Due to fecal, urinary or dual incontinence CSD (chronic skin damage) to the bilateral buttocks; shaggy appearance  Superficial skin loss under the pannus right side > left side  L30.4  - Erythema intertrigo. Also used for abrasion of the hand, chafing of the skin, dermatitis due to sweating and friction, friction dermatitis, friction eczema, and genital/thigh intertrigo.  Pressure Injury POA: Yes in relationship with moisture damage, sheer, and pressure to the left ischium  Measurement: open areas are less than 1cm in circumference and 0.1cm  in depth Wound bed: areas over the buttock are peeling, open areas are all pink, moist Drainage (amount, consistency, odor) scant from the buttocks, none under the pannus  Periwound: intact  Dressing procedure/placement/frequency: Cleanse all affected areas under the pannus and buttocks with bathing, pH balanced soap, and water. Pat dry Apply triple paste zinc based barrier ointment to the affected areas of the buttocks and inner thighs Cleanse under pannus removing all powders and creams. Use antimicrobial wicking fabric under pannus, see orders.   Low air loss mattress in place for moisture management and pressure redistribution.  If the patient moves out of the ICU would recommend bariatric air mattress.   Discussed POC with bedside nurse.  Re consult if needed, will not follow at this time. Thanks  Peggy Loge [employer], RN,CWOCN, CNS, [place] [phone])

## 2022-09-10 NOTE — Progress Notes (Signed)
NAME:  Kim Jenkins, MRN:  [id number], DOB:  April 22, 1966, LOS: 1 ADMISSION DATE:  09/09/2022, CONSULTATION DATE:  09/09/22 REFERRING MD:  ED  CHIEF COMPLAINT: AMS     History of Present Illness:  Kim Jenkins is a 56 year old woman with history of obesity, chronic trach, bipolar disorder, DMII, and OSA/OHS who was brought to the ER from [facility] due to AMS. She reportedly had decreased mental status today and was on ventilator all day. She is usually on ventilator at night. She was in the ER on 12/10 for AMS and given augmentin for concern of pneumonia due to aspiration.   VBG showed pH 7.429. Serum chemistry showed chloride 97, serum bicarb 33, Cr 1.21. BNP 87 and trop 16. CBC shows hemoglobin 11.3 platelets 141.   CT head is without acute abnormality. CT Chest shows bibasilar dependent consolidation. Negative for PE.  Pertinent  Medical History   Past Medical History:  Diagnosis Date   Bipolar 1 disorder ([place])    Chronic pain    Depression    Diabetes mellitus ([place])    Hypertension    Kidney stones    Morbid obesity ([place])    Obesity hypoventilation syndrome ([place])    Obstructive sleep apnea    Panniculitis    Significant Hospital Events: Including procedures, antibiotic start and stop dates in addition to other pertinent events   12/23 admitted for AMS 12/24 no overnight events  Interim History / Subjective:  Feels relatively well this morning, denies any pain or discomfort Placed on trach collar  Objective   Blood pressure 127/67, pulse 67, temperature 98.2 F (36.8 C), temperature source Axillary, resp. rate 13, weight 134.7 kg, SpO2 94 %. CVP:  [6 mmHg-9 mmHg] 9 mmHg  Vent Mode: Stand-by FiO2 (%):  [40 %] 40 % Set Rate:  [18 bmp] 18 bmp Vt Set:  [500 mL] 500 mL PEEP:  [5 cmH20-8 cmH20] 8 cmH20 Plateau Pressure:  [15 cmH20-30 cmH20] 18 cmH20   Intake/Output Summary (Last 24 hours) at 09/10/2022 0831 Last data filed at 09/10/2022 0800 Gross per 24 hour  Intake 284.55 ml   Output 600 ml  Net -315.45 ml   Filed Weights   09/10/22 0145  Weight: 134.7 kg    Examination: General: Morbidly obese, in no acute distress HENT: Moist oral mucosa Lungs: Decreased breath sounds bilaterally no wheezes Cardiovascular: S1-S2 appreciated Abdomen: Soft, nontender Extremities: Trace edema Neuro: Following commands GU: n/a  Resolved Hospital Problem list     Assessment & Plan:   Altered mental status -May be related to hypercapnia however alkalemia on ABG with pCO2 of 48 -This is likely related to obesity hypoventilation -CT head unremarkable -Will continue to monitor  Acute on chronic hypoxemic respiratory failure Obesity hypoventilation syndrome Obstructive sleep apnea -Continue mechanical ventilatory support at night -PEEP increased to 8 at night -Continue bronchodilator treatment  History of pulmonary embolism -Documentation so far shows not on anticoagulation -No anticoagulation  Type 2 diabetes -Continue SSI  History of bipolar disorder -Initiate home dose of Efexor  Chronic pain -Mental status is better at present -Will cautiously introduce chronic pain meds  Best Practice (right click and "Reselect all SmartList Selections" daily)   Diet/type: NPO DVT prophylaxis:  GI prophylaxis: H2B Lines: Central line Foley:  N/A Code Status:  full code Last date of multidisciplinary goals of care discussion [n/a]  Labs   CBC: Recent Labs  Lab 09/09/22 1430 09/09/22 1445 09/10/22 0149 09/10/22 0426  WBC 7.9  --  7.3  --   HGB 11.3* 11.9* 10.6* 11.9*  HCT 36.6 35.0* 35.1* 35.0*  MCV 90.6  --  90.7  --   PLT 141*  --  146*  --     Basic Metabolic Panel: Recent Labs  Lab 09/09/22 1430 09/09/22 1445 09/10/22 0149 09/10/22 0426  NA 141 141 140 142  K 3.7 3.7 3.5 3.8  CL 97*  --  98  --   CO2 33*  --  33*  --   GLUCOSE 133*  --  119*  --   BUN 30*  --  28*  --   CREATININE 1.21*  --  1.11*  --   CALCIUM 9.1  --  9.2  --   MG  2.1  --   --   --    GFR: Estimated Creatinine Clearance: 78.1 mL/min (A) (by C-G formula based on SCr of 1.11 mg/dL (H)). Recent Labs  Lab 09/09/22 1430 09/10/22 0149  WBC 7.9 7.3    Liver Function Tests: Recent Labs  Lab 09/09/22 1430  AST 22  ALT 11  ALKPHOS 54  BILITOT 0.4  PROT 7.3  ALBUMIN 2.4*   No results for input(s): "LIPASE", "AMYLASE" in the last 168 hours. Recent Labs  Lab 09/09/22 1741  AMMONIA 19    ABG    Component Value Date/Time   PHART 7.466 (H) 09/10/2022 0426   PCO2ART 48.1 (H) 09/10/2022 0426   PO2ART 94 09/10/2022 0426   HCO3 34.8 (H) 09/10/2022 0426   TCO2 36 (H) 09/10/2022 0426   O2SAT 98 09/10/2022 0426     Coagulation Profile: No results for input(s): "INR", "PROTIME" in the last 168 hours.  Cardiac Enzymes: No results for input(s): "CKTOTAL", "CKMB", "CKMBINDEX", "TROPONINI" in the last 168 hours.  HbA1C: Hgb A1c MFr Bld  Date/Time Value Ref Range Status  12/05/2019 06:15 AM 5.2 4.8 - 5.6 % Final    Comment:    (NOTE) Pre diabetes:          5.7%-6.4% Diabetes:              >6.4% Glycemic control for   <7.0% adults with diabetes     CBG: Recent Labs  Lab 09/09/22 2007 09/09/22 2314 09/10/22 0310 09/10/22 0733  GLUCAP 109* 129* 134* 153*    Review of Systems:   Unable to obtain complete ROS due to mental status  Past Medical History:  She,  has a past medical history of Bipolar 1 disorder ([place]), Chronic pain, Depression, Diabetes mellitus ([place]), Hypertension, Kidney stones, Morbid obesity ([place]), Obesity hypoventilation syndrome ([place]), Obstructive sleep apnea, and Panniculitis.   Surgical History:   Past Surgical History:  Procedure Laterality Date   ESOPHAGOGASTRODUODENOSCOPY N/A 05/06/2018   Procedure: ESOPHAGOGASTRODUODENOSCOPY (EGD);  Surgeon: Irving Copas., MD;  Location: [facility];  Service: Gastroenterology;  Laterality: N/A;   RIGHT OOPHORECTOMY     TRACHEOSTOMY     VENTRAL HERNIA REPAIR        Social History:   reports that she has never smoked. She has never used smokeless tobacco. She reports that she does not drink alcohol and does not use drugs.   Family History:  Her family history is not on file.   Allergies Allergies  Allergen Reactions   Meperidine And Related Shortness Of Breath and Other (See Comments)    "Allergic," per MAR   Cefazolin Other (See Comments)    "Allergic," per [facility]    The patient is critically ill with multiple organ  systems failure and requires high complexity decision making for assessment and support, frequent evaluation and titration of therapies, application of advanced monitoring technologies and extensive interpretation of multiple databases. Critical Care Time devoted to patient care services described in this note independent of APP/resident time (if applicable)  is 32 minutes.   Sherrilyn Rist MD [facility] Personal pager: See Amion If unanswered, please page CCM On-call: [phone]

## 2022-09-11 DIAGNOSIS — R400 Somnolence: Secondary | ICD-10-CM

## 2022-09-11 LAB — CBC
HCT: 36.5 % (ref 36.0–46.0)
Hemoglobin: 11.2 g/dL — ABNORMAL LOW (ref 12.0–15.0)
MCH: 27.9 pg (ref 26.0–34.0)
MCHC: 30.7 g/dL (ref 30.0–36.0)
MCV: 90.8 fL (ref 80.0–100.0)
Platelets: 153 10*3/uL (ref 150–400)
RBC: 4.02 MIL/uL (ref 3.87–5.11)
RDW: 16.2 % — ABNORMAL HIGH (ref 11.5–15.5)
WBC: 6.3 10*3/uL (ref 4.0–10.5)
nRBC: 0 % (ref 0.0–0.2)

## 2022-09-11 LAB — URINE CULTURE: Culture: 5000 — AB

## 2022-09-11 LAB — BASIC METABOLIC PANEL
Anion gap: 9 (ref 5–15)
BUN: 19 mg/dL (ref 6–20)
CO2: 34 mmol/L — ABNORMAL HIGH (ref 22–32)
Calcium: 9.4 mg/dL (ref 8.9–10.3)
Chloride: 96 mmol/L — ABNORMAL LOW (ref 98–111)
Creatinine, Ser: 0.95 mg/dL (ref 0.44–1.00)
GFR, Estimated: >60 mL/min (ref >60)
Glucose, Bld: 134 mg/dL — ABNORMAL HIGH (ref 70–99)
Potassium: 3.4 mmol/L — ABNORMAL LOW (ref 3.5–5.1)
Sodium: 139 mmol/L (ref 135–145)

## 2022-09-11 LAB — GLUCOSE, CAPILLARY
Glucose-Capillary: 128 mg/dL — ABNORMAL HIGH (ref 70–99)
Glucose-Capillary: 135 mg/dL — ABNORMAL HIGH (ref 70–99)
Glucose-Capillary: 144 mg/dL — ABNORMAL HIGH (ref 70–99)

## 2022-09-11 LAB — MAGNESIUM: Magnesium: 2.1 mg/dL (ref 1.7–2.4)

## 2022-09-11 MED ORDER — ENOXAPARIN SODIUM 80 MG/0.8ML IJ SOSY
0.5000 mg/kg | PREFILLED_SYRINGE | INTRAMUSCULAR | Status: DC
  Administered 2022-09-11 – 2022-09-12 (×2): 65 mg via SUBCUTANEOUS
  Filled 2022-09-11 (×2): qty 0.65

## 2022-09-11 MED ORDER — HYDRALAZINE HCL 50 MG PO TABS: 50.0000 mg | ORAL_TABLET | Freq: Four times a day (QID) | ORAL | Status: DC | PRN

## 2022-09-11 MED ORDER — FENTANYL 25 MCG/HR TD PT72: 1.0000 | MEDICATED_PATCH | TRANSDERMAL | Status: DC

## 2022-09-11 MED ORDER — GABAPENTIN 300 MG PO CAPS
300.0000 mg | ORAL_CAPSULE | Freq: Two times a day (BID) | ORAL | Status: DC
  Administered 2022-09-11 – 2022-09-12 (×2): 300 mg via ORAL
  Filled 2022-09-11 (×3): qty 1

## 2022-09-11 MED ORDER — FENTANYL 25 MCG/HR TD PT72
1.0000 | MEDICATED_PATCH | TRANSDERMAL | Status: DC
  Administered 2022-09-12: 1 via TRANSDERMAL
  Filled 2022-09-11 (×2): qty 1

## 2022-09-11 MED ORDER — SODIUM CHLORIDE 0.9 % IV SOLN: INTRAVENOUS | Status: DC | PRN

## 2022-09-11 MED ORDER — ENOXAPARIN SODIUM 40 MG/0.4ML IJ SOSY: 40.0000 mg | PREFILLED_SYRINGE | INTRAMUSCULAR | Status: DC

## 2022-09-11 MED ORDER — POTASSIUM CHLORIDE 10 MEQ/50ML IV SOLN
10.0000 meq | INTRAVENOUS | Status: AC
  Administered 2022-09-11 (×4): 10 meq via INTRAVENOUS
  Filled 2022-09-11 (×4): qty 50

## 2022-09-11 MED ORDER — FLUOXETINE HCL 10 MG PO CAPS
20.0000 mg | ORAL_CAPSULE | Freq: Every day | ORAL | Status: DC
  Administered 2022-09-12: 20 mg via ORAL
  Filled 2022-09-11 (×2): qty 2

## 2022-09-11 NOTE — Progress Notes (Signed)
NAME:  Kim Jenkins, MRN:  [id number], DOB:  [birth year], LOS: 2 ADMISSION DATE:  09/09/2022, CONSULTATION DATE:  09/09/22 REFERRING MD:  ED  CHIEF COMPLAINT: AMS     History of Present Illness:  Kim Jenkins is a 56 year old woman with history of obesity, chronic trach, bipolar disorder, DMII, and OSA/OHS who was brought to the ER from [facility] due to AMS. She reportedly had decreased mental status today and was on ventilator all day. She is usually on ventilator at night. She was in the ER on 12/10 for AMS and given augmentin for concern of pneumonia due to aspiration.   VBG showed pH 7.429. Serum chemistry showed chloride 97, serum bicarb 33, Cr 1.21. BNP 87 and trop 16. CBC shows hemoglobin 11.3 platelets 141.   CT head is without acute abnormality. CT Chest shows bibasilar dependent consolidation. Negative for PE.  Pertinent  Medical History   Past Medical History:  Diagnosis Date   Bipolar 1 disorder ([place])    Chronic pain    Depression    Diabetes mellitus ([place])    Hypertension    Kidney stones    Morbid obesity ([place])    Obesity hypoventilation syndrome ([place])    Obstructive sleep apnea    Panniculitis    Significant Hospital Events: Including procedures, antibiotic start and stop dates in addition to other pertinent events   12/23 admitted for AMS 12/24 no overnight events  Interim History / Subjective:   No overnight events Feels relatively well  Objective   Blood pressure 117/88, pulse 79, temperature 98.9 F (37.2 C), temperature source Axillary, resp. rate 18, height '5\' 6"'$  (1.676 m), weight 131.6 kg, SpO2 100 %. CVP:  [6 mmHg-11 mmHg] 8 mmHg  Vent Mode: PRVC FiO2 (%):  [40 %] 40 % Set Rate:  [18 bmp] 18 bmp Vt Set:  [500 mL] 500 mL PEEP:  [5 cmH20-8 cmH20] 5 cmH20   Intake/Output Summary (Last 24 hours) at 09/11/2022 0931 Last data filed at 09/11/2022 0700 Gross per 24 hour  Intake 124.36 ml  Output 235 ml  Net -110.64 ml   Filed Weights   09/10/22 0145  09/11/22 0500  Weight: 134.7 kg 131.6 kg   Examination: General: Morbid obesity, not in acute distress HENT: Moist oral mucosa Lungs: Decreased air entry bilaterally, no wheezes Cardiovascular: Sinus appreciated soft, bowel sounds appreciated, nontender Abdomen: Soft, nontender Extremities: Trace edema Neuro: Awake alert following commands GU: n/a  Resolved Hospital Problem list     Assessment & Plan:   Altered mental status -Related to hypercapnia, alkalemia on ABG with pCO2 of 48 -Likely related to obesity hypoventilation -CT head unremarkable -Will continue to monitor  Acute on chronic hypoxemic respiratory failure Obesity hypoventilation syndrome Obstructive sleep apnea -Continue mechanical ventilator support PEEP increased to 8 -Continue bronchodilator treatments  Type 2 diabetes -Continue SSI  History of pulmonary embolism in the past -No longer on anticoagulation  Type 2 diabetes -Continue SSI  History of bipolar disorder -Effexor initiated, on hold pending speech eval Best Practice (right click and "Reselect all SmartList Selections" daily)   Diet/type: NPO DVT prophylaxis:  GI prophylaxis: H2B Lines: Central line Foley:  N/A Code Status:  full code Last date of multidisciplinary goals of care discussion [n/a]  Labs   CBC: Recent Labs  Lab 09/09/22 1430 09/09/22 1445 09/10/22 0149 09/10/22 0426 09/11/22 0315  WBC 7.9  --  7.3  --  6.3  HGB 11.3* 11.9* 10.6* 11.9* 11.2*  HCT 36.6 35.0*  35.1* 35.0* 36.5  MCV 90.6  --  90.7  --  90.8  PLT 141*  --  146*  --  244    Basic Metabolic Panel: Recent Labs  Lab 09/09/22 1430 09/09/22 1445 09/10/22 0149 09/10/22 0426 09/11/22 0315  NA 141 141 140 142 139  K 3.7 3.7 3.5 3.8 3.4*  CL 97*  --  98  --  96*  CO2 33*  --  33*  --  34*  GLUCOSE 133*  --  119*  --  134*  BUN 30*  --  28*  --  19  CREATININE 1.21*  --  1.11*  --  0.95  CALCIUM 9.1  --  9.2  --  9.4  MG 2.1  --   --   --  2.1    GFR: Estimated Creatinine Clearance: 89.9 mL/min (by C-G formula based on SCr of 0.95 mg/dL). Recent Labs  Lab 09/09/22 1430 09/10/22 0149 09/11/22 0315  WBC 7.9 7.3 6.3    Liver Function Tests: Recent Labs  Lab 09/09/22 1430  AST 22  ALT 11  ALKPHOS 54  BILITOT 0.4  PROT 7.3  ALBUMIN 2.4*   No results for input(s): "LIPASE", "AMYLASE" in the last 168 hours. Recent Labs  Lab 09/09/22 1741  AMMONIA 19    ABG    Component Value Date/Time   PHART 7.466 (H) 09/10/2022 0426   PCO2ART 48.1 (H) 09/10/2022 0426   PO2ART 94 09/10/2022 0426   HCO3 34.8 (H) 09/10/2022 0426   TCO2 36 (H) 09/10/2022 0426   O2SAT 98 09/10/2022 0426     Coagulation Profile: No results for input(s): "INR", "PROTIME" in the last 168 hours.  Cardiac Enzymes: No results for input(s): "CKTOTAL", "CKMB", "CKMBINDEX", "TROPONINI" in the last 168 hours.  HbA1C: Hgb A1c MFr Bld  Date/Time Value Ref Range Status  12/05/2019 06:15 AM 5.2 4.8 - 5.6 % Final    Comment:    (NOTE) Pre diabetes:          5.7%-6.4% Diabetes:              >6.4% Glycemic control for   <7.0% adults with diabetes     CBG: Recent Labs  Lab 09/10/22 1110 09/10/22 1514 09/10/22 1908 09/10/22 2331 09/11/22 0319  GLUCAP 119* 122* 132* 128* 128*    Review of Systems:   Unable to obtain complete ROS due to mental status  Past Medical History:  She,  has a past medical history of Bipolar 1 disorder ([place]), Chronic pain, Depression, Diabetes mellitus ([place]), Hypertension, Kidney stones, Morbid obesity ([place]), Obesity hypoventilation syndrome ([place]), Obstructive sleep apnea, and Panniculitis.   Surgical History:   Past Surgical History:  Procedure Laterality Date   ESOPHAGOGASTRODUODENOSCOPY N/A 05/06/2018   Procedure: ESOPHAGOGASTRODUODENOSCOPY (EGD);  Surgeon: Irving Copas., MD;  Location: [facility];  Service: Gastroenterology;  Laterality: N/A;   RIGHT OOPHORECTOMY     TRACHEOSTOMY     VENTRAL  HERNIA REPAIR       Social History:   reports that she has never smoked. She has never used smokeless tobacco. She reports that she does not drink alcohol and does not use drugs.   Family History:  Her family history is not on file.   Allergies Allergies  Allergen Reactions   Meperidine And Related Shortness Of Breath and Other (See Comments)    "Allergic," per MAR   Cefazolin Other (See Comments)    "Allergic," per [facility]    The patient is critically  ill with multiple organ systems failure and requires high complexity decision making for assessment and support, frequent evaluation and titration of therapies, application of advanced monitoring technologies and extensive interpretation of multiple databases. Critical Care Time devoted to patient care services described in this note independent of APP/resident time (if applicable)  is 31 minutes.   Sherrilyn Rist MD [facility] Personal pager: See Amion If unanswered, please page CCM On-call: [phone]

## 2022-09-11 NOTE — Progress Notes (Signed)
[facility] ADULT ICU REPLACEMENT PROTOCOL   The patient does apply for the [facility] Adult ICU Electrolyte Replacment Protocol based on the criteria listed below:   1.Exclusion criteria: TCTS, ECMO, Dialysis, and Myasthenia Gravis patients 2. Is GFR >/= 30 ml/min? Yes.    Patient's GFR today is >60 3. Is SCr </= 2? Yes.   Patient's SCr is 0.95 mg/dL 4. Did SCr increase >/= 0.5 in 24 hours? No. 5.Pt's weight >40kg  Yes.   6. Abnormal electrolyte(s): K  7. Electrolytes replaced per protocol 8.  Call MD STAT for K+ </= 2.5, Phos </= 1, or Mag </= 1 Physician:  Luci Bank [facility] 09/11/2022 4:33 AM

## 2022-09-11 NOTE — Progress Notes (Signed)
Pt in a delirious state.Pt in [place] having a ongoing conversation w/ dog(who is not in [place]). MD notified and reorientation provide by this RN

## 2022-09-11 NOTE — Progress Notes (Signed)
Pt AMS and PO meds refused. MD notified

## 2022-09-11 NOTE — Plan of Care (Addendum)
Patient remains in MICU. Patient is on ATC at time of shift change. Patient is AA+Ox1 but follows commands. Patient endorses visual hallucinations of a pet dog.   Edit: ATC overnight, no ventilation per RT, as well as per E-Link RN.   Problem: Education: Goal: Knowledge of General Education information will improve Description: Including pain rating scale, medication(s)/side effects and non-pharmacologic comfort measures Outcome: Not Progressing   Problem: Health Behavior/Discharge Planning: Goal: Ability to manage health-related needs will improve Outcome: Not Progressing   Problem: Clinical Measurements: Goal: Ability to maintain clinical measurements within normal limits will improve Outcome: Not Progressing Goal: Will remain free from infection Outcome: Not Progressing Goal: Diagnostic test results will improve Outcome: Not Progressing Goal: Respiratory complications will improve Outcome: Not Progressing Goal: Cardiovascular complication will be avoided Outcome: Not Progressing   Problem: Activity: Goal: Risk for activity intolerance will decrease Outcome: Not Progressing   Problem: Nutrition: Goal: Adequate nutrition will be maintained Outcome: Not Progressing   Problem: Coping: Goal: Level of anxiety will decrease Outcome: Not Progressing   Problem: Elimination: Goal: Will not experience complications related to bowel motility Outcome: Not Progressing Goal: Will not experience complications related to urinary retention Outcome: Not Progressing   Problem: Pain Managment: Goal: General experience of comfort will improve Outcome: Not Progressing   Problem: Safety: Goal: Ability to remain free from injury will improve Outcome: Not Progressing   Problem: Skin Integrity: Goal: Risk for impaired skin integrity will decrease Outcome: Not Progressing

## 2022-09-11 NOTE — Evaluation (Signed)
[facility] Speaking Valve - Evaluation Patient Details  Name: Kim Jenkins MRN: [id number] Date of Birth: September 14, 1966  Today's Date: 09/11/2022 Time: 1135-1150 SLP Time Calculation (min) (ACUTE ONLY): 15 min  Past Medical History:  Past Medical History:  Diagnosis Date   Bipolar 1 disorder ([place])    Chronic pain    Depression    Diabetes mellitus ([place])    Hypertension    Kidney stones    Morbid obesity ([place])    Obesity hypoventilation syndrome ([place])    Obstructive sleep apnea    Panniculitis    Past Surgical History:  Past Surgical History:  Procedure Laterality Date   ESOPHAGOGASTRODUODENOSCOPY N/A 05/06/2018   Procedure: ESOPHAGOGASTRODUODENOSCOPY (EGD);  Surgeon: Irving Copas., MD;  Location: [facility];  Service: Gastroenterology;  Laterality: N/A;   RIGHT OOPHORECTOMY     TRACHEOSTOMY     VENTRAL HERNIA REPAIR     HPI:  Patient is a 56 y.o. female with PMH: obesity, chronic trach (PMV during day, vent at night), DM-2, OSA/OHS, depression, HTN, bipolar 1 disorder. She is a resident at [facility] and was brought to ER on 09/09/22 due to AMS requiring her to remain on ventilator all day. She was in the ER on 12/10 for AMS and given augmentin for concern of pneumonia due to aspiration. During current hospitalization, CT head negative for acute abnormality, CT chest showed bibasilar dependent consolidation, negative for PE.    Assessment / Plan / Recommendation  Clinical Impression  Patient was assessed for readiness to use PMV (purple speaking valve) as at baseline at [facility] she is on trach collar during the day, wears PMV when awake, and is on vent at night. When SLP arrived into room, patient on trach collar, cuff deflated and is awake and alert. She is confused, oriented to self but stating place as "[facility]" and talking about birds that she is seeing in the room. SpO2 and RR both WFL prior to [place] being donned and no change with PMV on. Patient able to achieve voicing  immediately, which is clear, strong. No back pressure observed when PMV periodically removed. She wore PMV for over 30 minutes without any observed difficulties. SLP is recommending patient wear PMV all waking hours, remove at night. SLP Visit Diagnosis: Aphonia (R49.1)    SLP Assessment  Patient needs continued Speech [facility]    Recommendations for follow up therapy are one component of a multi-disciplinary discharge planning process, led by the attending physician.  Recommendations may be updated based on patient status, additional functional criteria and insurance authorization.  Follow Up Recommendations  SLP at Long-term acute care hospital    Assistance Recommended at Discharge Frequent or constant Supervision/Assistance  Functional Status Assessment Patient has had a recent decline in their functional status and demonstrates the ability to make significant improvements in function in a reasonable and predictable amount of time.  Frequency and Duration min 2x/week  2 weeks    PMSV Trial PMSV was placed for: 30 minutes Able to redirect subglottic air through upper airway: Yes Able to Attain Phonation: Yes Voice Quality: Normal Able to Expectorate Secretions: No attempts Level of Secretion Expectoration with PMSV: Not observed Breath Support for Phonation: Adequate Intelligibility: Intelligible Respirations During Trial: 14 SpO2 During Trial: 97 % Pulse During Trial: 73 Behavior: Alert;Confused;Expresses self well;Responsive to questions   Tracheostomy Tube       Vent Dependency  FiO2 (%): 40 %    Cuff Deflation Trial  Cuff deflated prior to SLP evaluation  [name], MA, CCC-SLP Speech Therapy

## 2022-09-11 NOTE — Progress Notes (Signed)
Swallow screen cancelled, pt not medically stable. PO meds held for now, this RN will reach out to pharmacy to change route. MD notified and MD agreed w/ decision.

## 2022-09-11 NOTE — Progress Notes (Signed)
Patient noticed to have some delirium  Will reinitiate some home meds Pain medications hide it is well Will continue to monitor

## 2022-09-11 NOTE — Evaluation (Signed)
Clinical/Bedside Swallow Evaluation Patient Details  Name: Kim Jenkins MRN: [id number] Date of Birth: Feb 06, 1966  Today's Date: 09/11/2022 Time: SLP Start Time (ACUTE ONLY): 1150 SLP Stop Time (ACUTE ONLY): 1210 SLP Time Calculation (min) (ACUTE ONLY): 20 min  Past Medical History:  Past Medical History:  Diagnosis Date   Bipolar 1 disorder ([place])    Chronic pain    Depression    Diabetes mellitus ([place])    Hypertension    Kidney stones    Morbid obesity ([place])    Obesity hypoventilation syndrome ([place])    Obstructive sleep apnea    Panniculitis    Past Surgical History:  Past Surgical History:  Procedure Laterality Date   ESOPHAGOGASTRODUODENOSCOPY N/A 05/06/2018   Procedure: ESOPHAGOGASTRODUODENOSCOPY (EGD);  Surgeon: Irving Copas., MD;  Location: [facility];  Service: Gastroenterology;  Laterality: N/A;   RIGHT OOPHORECTOMY     TRACHEOSTOMY     VENTRAL HERNIA REPAIR     HPI:  Patient is a 56 y.o. female with PMH: obesity, chronic trach (PMV during day, vent at night), DM-2, OSA/OHS, depression, HTN, bipolar 1 disorder. She is a resident at [facility] and was brought to ER on 09/09/22 due to AMS requiring her to remain on ventilator all day. She was in the ER on 12/10 for AMS and given augmentin for concern of pneumonia due to aspiration. During current hospitalization, CT head negative for acute abnormality, CT chest showed bibasilar dependent consolidation, negative for PE.    Assessment / Plan / Recommendation  Clinical Impression  Patient is presenting with a mild, primarily oral phase dysphagia but without overt s/s of aspiration or penetration. When asked, she reported she does not have dentures but is able to eat all foods. She is edentulous on top and has some incisors on bottom. SLP assessed her toleration of thin liquids (water), puree solids (applesauce) and regular solids (graham crackers) at bedside. Swallow initiation appeared timely, no coughing, throat  clearing or other overt s/s that could be indicative of a dysphagia. She exhibited mild delay in mastication and oral transit with graham cracker. PMV was in place during all PO intake. SLP recommending initiate PO diet of Dys 3 (mechanical soft) solids, thin liquids and  have PMV in place for all PO's. SLP will follow patient for PMV and swallow function. SLP Visit Diagnosis: Dysphagia, unspecified (R13.10)    Aspiration Risk  Mild aspiration risk    Diet Recommendation Dysphagia 3 (Mech soft);Thin liquid   Liquid Administration via: Cup;Straw Medication Administration: Whole meds with puree Supervision: Full supervision/cueing for compensatory strategies;Staff to assist with self feeding Compensations: Slow rate;Small sips/bites Postural Changes: Seated upright at 90 degrees    Other  Recommendations Oral Care Recommendations: Oral care BID;Staff/trained caregiver to provide oral care    Recommendations for follow up therapy are one component of a multi-disciplinary discharge planning process, led by the attending physician.  Recommendations may be updated based on patient status, additional functional criteria and insurance authorization.  Follow up Recommendations SLP at Long-term acute care hospital      Assistance Recommended at Discharge Frequent or constant Supervision/Assistance  Functional Status Assessment Patient has had a recent decline in their functional status and demonstrates the ability to make significant improvements in function in a reasonable and predictable amount of time.  Frequency and Duration min 2x/week  2 weeks       Prognosis Prognosis for Safe Diet Advancement: Good      Swallow Study   General Date of Onset:  09/09/22 HPI: Patient is a 56 y.o. female with PMH: obesity, chronic trach (PMV during day, vent at night), DM-2, OSA/OHS, depression, HTN, bipolar 1 disorder. She is a resident at [facility] and was brought to ER on 09/09/22 due to AMS requiring  her to remain on ventilator all day. She was in the ER on 12/10 for AMS and given augmentin for concern of pneumonia due to aspiration. During current hospitalization, CT head negative for acute abnormality, CT chest showed bibasilar dependent consolidation, negative for PE. Previous Swallow Assessment: during remote admission 2021 Diet Prior to this Study: NPO Temperature Spikes Noted: No Respiratory Status: Trach Collar History of Recent Intubation: No Behavior/Cognition: Alert;Cooperative;Pleasant mood;Confused Oral Cavity Assessment: Within Functional Limits Oral Care Completed by SLP: Yes Oral Cavity - Dentition: Edentulous;Missing dentition;Other (Comment) (edentulous top, has bottom incisors) Self-Feeding Abilities: Total assist Patient Positioning: Upright in bed Baseline Vocal Quality: Normal Volitional Cough: Strong Volitional Swallow: Able to elicit    Oral/Motor/Sensory Function Overall Oral Motor/Sensory Function: Within functional limits   Ice Chips     Thin Liquid Thin Liquid: Within functional limits Presentation: Straw    Nectar Thick     Honey Thick     Puree Puree: Within functional limits Presentation: Spoon   Solid     Solid: Impaired Oral Phase Impairments: Impaired mastication Oral Phase Functional Implications: Impaired mastication;Prolonged oral transit     [name], MA, CCC-SLP Speech Therapy

## 2022-09-12 DIAGNOSIS — G9340 Encephalopathy, unspecified: Secondary | ICD-10-CM

## 2022-09-12 LAB — COMPREHENSIVE METABOLIC PANEL
ALT: 15 U/L (ref 0–44)
AST: 26 U/L (ref 15–41)
Albumin: 2.8 g/dL — ABNORMAL LOW (ref 3.5–5.0)
Alkaline Phosphatase: 56 U/L (ref 38–126)
Anion gap: 9 (ref 5–15)
BUN: 14 mg/dL (ref 6–20)
CO2: 33 mmol/L — ABNORMAL HIGH (ref 22–32)
Calcium: 9.4 mg/dL (ref 8.9–10.3)
Chloride: 97 mmol/L — ABNORMAL LOW (ref 98–111)
Creatinine, Ser: 0.66 mg/dL (ref 0.44–1.00)
GFR, Estimated: >60 mL/min (ref >60)
Glucose, Bld: 136 mg/dL — ABNORMAL HIGH (ref 70–99)
Potassium: 3.8 mmol/L (ref 3.5–5.1)
Sodium: 139 mmol/L (ref 135–145)
Total Bilirubin: 0.3 mg/dL (ref 0.3–1.2)
Total Protein: 7.5 g/dL (ref 6.5–8.1)

## 2022-09-12 LAB — CBC WITH DIFFERENTIAL/PLATELET
Abs Immature Granulocytes: 0.03 10*3/uL (ref 0.00–0.07)
Basophils Absolute: 0 10*3/uL (ref 0.0–0.1)
Basophils Relative: 0 %
Eosinophils Absolute: 0.1 10*3/uL (ref 0.0–0.5)
Eosinophils Relative: 2 %
HCT: 37.2 % (ref 36.0–46.0)
Hemoglobin: 11.4 g/dL — ABNORMAL LOW (ref 12.0–15.0)
Immature Granulocytes: 0 %
Lymphocytes Relative: 21 %
Lymphs Abs: 1.4 10*3/uL (ref 0.7–4.0)
MCH: 28 pg (ref 26.0–34.0)
MCHC: 30.6 g/dL (ref 30.0–36.0)
MCV: 91.4 fL (ref 80.0–100.0)
Monocytes Absolute: 1 10*3/uL (ref 0.1–1.0)
Monocytes Relative: 14 %
Neutro Abs: 4.3 10*3/uL (ref 1.7–7.7)
Neutrophils Relative %: 63 %
Platelets: 139 10*3/uL — ABNORMAL LOW (ref 150–400)
RBC: 4.07 MIL/uL (ref 3.87–5.11)
RDW: 16.1 % — ABNORMAL HIGH (ref 11.5–15.5)
WBC: 6.9 10*3/uL (ref 4.0–10.5)
nRBC: 0 % (ref 0.0–0.2)

## 2022-09-12 LAB — MAGNESIUM: Magnesium: 2.4 mg/dL (ref 1.7–2.4)

## 2022-09-12 LAB — PHOSPHORUS: Phosphorus: 4 mg/dL (ref 2.5–4.6)

## 2022-09-12 MED ORDER — ORAL CARE MOUTH RINSE: 15.0000 mL | OROMUCOSAL | Status: DC | PRN

## 2022-09-12 MED ORDER — MUPIROCIN 2 % EX OINT
1.0000 | TOPICAL_OINTMENT | Freq: Two times a day (BID) | CUTANEOUS | Status: DC
  Administered 2022-09-12: 1 via NASAL
  Filled 2022-09-12: qty 22

## 2022-09-12 MED ORDER — MUPIROCIN 2 % EX OINT: 1.0000 | TOPICAL_OINTMENT | Freq: Two times a day (BID) | CUTANEOUS | 0 refills | Status: DC

## 2022-09-12 MED ORDER — CARIPRAZINE HCL 1.5 MG PO CAPS
3.0000 mg | ORAL_CAPSULE | ORAL | Status: DC
  Administered 2022-09-12: 3 mg via ORAL
  Filled 2022-09-12: qty 2
  Filled 2022-09-12: qty 1

## 2022-09-12 MED ORDER — ACETAMINOPHEN 325 MG PO TABS: 650.0000 mg | ORAL_TABLET | Freq: Four times a day (QID) | ORAL | Status: DC | PRN

## 2022-09-12 MED ORDER — ORAL CARE MOUTH RINSE
15.0000 mL | OROMUCOSAL | Status: DC
  Administered 2022-09-12 (×2): 15 mL via OROMUCOSAL

## 2022-09-12 MED ORDER — ZINC OXIDE 12.8 % EX OINT: TOPICAL_OINTMENT | Freq: Two times a day (BID) | CUTANEOUS | 0 refills | Status: DC

## 2022-09-12 NOTE — Progress Notes (Signed)
Report called to [facility] by [employer]. Patient transferred via [facility] to [facility]

## 2022-09-12 NOTE — Progress Notes (Signed)
[facility] Progress Note Patient Name: Kim Jenkins DOB: 1965/12/24 MRN: [id number]   Date of Service  09/12/2022  HPI/Events of Note  RN requested medication for some generalized pain. No other symptoms. Currently asleep. Has a fentanyl patch on.   eICU Interventions  PRN tylenol ordered     Intervention Category Intermediate Interventions: Pain - evaluation and management  Courtlynn Holloman G Andree Golphin 09/12/2022, 1:22 AM

## 2022-09-12 NOTE — TOC Transition Note (Signed)
Transition of Care [facility]) - CM/SW Discharge Note   Patient Details  Name: Kim Jenkins MRN: [id number] Date of Birth: 1966/05/08  [facility] CM/SW Contact:  Joanne Chars, LCSW Phone Number: 09/12/2022, 3:00 PM   Clinical Narrative:   CSW informed that pt is ready to DC back to [facility] subacute unit.  CSW confirmed with Angie/[facility] that they can receive pt today: room 312B, accepting MD Nona Dell.  RN call report to [phone].  CSW unable to reach either listed contact, Apolonio Schneiders or Peggye Fothergill, to inform them of DC.  RN reports she attempted to contact them as well.  [facility] informed.      Final next level of care: Skilled Nursing Facility Barriers to Discharge: No Barriers Identified   Patient Goals and CMS Choice      Discharge Placement                  Patient to be transferred to facility by: [facility] Name of family member notified: unable to reach either listed contact or leave message Patient and family notified of of transfer: 09/12/22  Discharge Plan and Services Additional resources added to the After Visit Summary for                                       Social Determinants of Health (SDOH) Interventions SDOH Screenings   Tobacco Use: Low Risk  (02/26/2021)     Readmission Risk Interventions     No data to display

## 2022-09-12 NOTE — Progress Notes (Signed)
   NAME:  Kim Jenkins, MRN:  [id number], DOB:  1965/10/14, LOS: 3 ADMISSION DATE:  09/09/2022, CONSULTATION DATE:  09/09/22 REFERRING MD:  ED  CHIEF COMPLAINT: AMS     History of Present Illness:  Kim Jenkins is a 56 year old woman with history of obesity, chronic trach, bipolar disorder, DMII, and OSA/OHS who was brought to the ER from [facility] due to AMS. She reportedly had decreased mental status today and was on ventilator all day. She is usually on ventilator at night. She was in the ER on 12/10 for AMS and given augmentin for concern of pneumonia due to aspiration.   VBG showed pH 7.429. Serum chemistry showed chloride 97, serum bicarb 33, Cr 1.21. BNP 87 and trop 16. CBC shows hemoglobin 11.3 platelets 141.   CT head is without acute abnormality. CT Chest shows bibasilar dependent consolidation. Negative for PE.  Pertinent  Medical History   Past Medical History:  Diagnosis Date   Bipolar 1 disorder ([place])    Chronic pain    Depression    Diabetes mellitus ([place])    Hypertension    Kidney stones    Morbid obesity ([place])    Obesity hypoventilation syndrome ([place])    Obstructive sleep apnea    Panniculitis    Significant Hospital Events: Including procedures, antibiotic start and stop dates in addition to other pertinent events   12/23 admitted for AMS 12/24 no overnight events  Interim History / Subjective:  No events. Resting comfortably. ?delirium last night, home pain meds resumed  Objective   Blood pressure 113/74, pulse 69, temperature 98.5 F (36.9 C), temperature source Axillary, resp. rate (!) 22, height '5\' 6"'$  (1.676 m), weight 130.9 kg, SpO2 97 %. CVP:  [3 mmHg-10 mmHg] 10 mmHg  Vent Mode: PRVC FiO2 (%):  [40 %-100 %] 40 % Set Rate:  [18 bmp] 18 bmp Vt Set:  [500 mL] 500 mL PEEP:  [5 cmH20] 5 cmH20   Intake/Output Summary (Last 24 hours) at 09/12/2022 0932 Last data filed at 09/12/2022 0700 Gross per 24 hour  Intake 293.99 ml  Output 200 ml  Net 93.99 ml     Filed Weights   09/10/22 0145 09/11/22 0500 09/12/22 0400  Weight: 134.7 kg 131.6 kg 130.9 kg   Examination: No distress On TC Reduced breath sounds and heart sounds due to body habitus Global anasarca Trach in place minimal secretions Moves ext to command  Resolved Hospital Problem list     Assessment & Plan:  Acute metabolic encephalopathy- likely polypharmacy in context of acute kidney injury (benadryl, valium, gabapentin, trazadone, fentanyl, vraylar on PTA list) Baseline bipolar, psych issues, superimposed ICU delirium- will be intermittent issue, encourage day/night cycles OHS s/p trach- unclear why they are putting on vent, has not needed AKI- resolved DM2- sugars good on SSI   - Fent patch, gabapentin, prozac resumed - Hold benzos for now - Restart home vraylar - Encourage day/night cycles, re-orient/re-direct PRN - Stable for transfer to progressive or back to [facility] as I suspect back to near baseline - Consider psych consult if stops improving  Erskine Emery MD PCCM

## 2022-09-12 NOTE — Discharge Summary (Signed)
Physician Discharge Summary  Patient ID: Kim Jenkins MRN: [id number] DOB/AGE: [birth year] 56 y.o.  Admit date: 09/09/2022 Discharge date: 09/12/2022  Admission Diagnoses:  Discharge Diagnoses:  Principal Problem:   AMS (altered mental status)   Discharged Condition: stable  Hospital Course:  Kim Jenkins is a 56 year old woman with history of obesity, chronic trach, bipolar disorder, DMII, and OSA/OHS who was brought to the ER from [facility] due to AMS. She reportedly had decreased mental status today and was on ventilator all day. She is usually on ventilator at night. She was in the ER on 12/10 for AMS and given augmentin for concern of pneumonia due to aspiration.    VBG showed pH 7.429. Serum chemistry showed chloride 97, serum bicarb 33, Cr 1.21. BNP 87 and trop 16. CBC shows hemoglobin 11.3 platelets 141.    CT head is without acute abnormality. CT Chest shows bibasilar dependent consolidation. Negative for PE.  She was noted to have AKI and improved with hydration, supportive care, and holding home alertness-depressing meds.  Some of these meds were reintroduced (see med rec below).  She can continue on qHS vent support at SNF.  Suspicion for etiology of AMS is polypharmacy exacerbated by AKI.  Significant Diagnostic Studies:  CT Head Neg   Discharge Exam: Blood pressure 128/74, pulse 81, temperature 98.9 F (37.2 C), temperature source Oral, resp. rate 16, height '5\' 6"'$  (1.676 m), weight 130.9 kg, SpO2 99 %. No distress On TC Reduced breath sounds and heart sounds due to body habitus Global anasarca Trach in place minimal secretions Moves ext to command Following commands, using PMV  Disposition: [facility]   Allergies as of 09/12/2022       Reactions   Meperidine And Related Shortness Of Breath, Other (See Comments)   "Allergic," per MAR   Cefazolin Other (See Comments)   "Allergic," per [facility]        Medication List     STOP taking these medications     acetaminophen 325 MG tablet Commonly known as: TYLENOL   amoxicillin-clavulanate 875-125 MG tablet Commonly known as: AUGMENTIN   ARIPiprazole 5 MG tablet Commonly known as: ABILIFY   dextromethorphan-guaiFENesin 10-100 MG/5ML liquid Commonly known as: ROBITUSSIN-DM   diazepam 2 MG tablet Commonly known as: VALIUM   diclofenac 75 MG EC tablet Commonly known as: VOLTAREN   diphenhydrAMINE 25 mg capsule Commonly known as: BENADRYL   HYDROcodone-acetaminophen 5-325 MG tablet Commonly known as: NORCO/VICODIN   Rivaroxaban 15 MG Tabs tablet Commonly known as: XARELTO   rivaroxaban 20 MG Tabs tablet Commonly known as: XARELTO   sodium chloride flush 0.9 % Soln Commonly known as: NS   torsemide 20 MG tablet Commonly known as: DEMADEX   traZODone 150 MG tablet Commonly known as: DESYREL       TAKE these medications    divalproex 500 MG 24 hr tablet Commonly known as: DEPAKOTE ER Take 2 tablets (1,000 mg total) by mouth every 12 (twelve) hours.   erythromycin ophthalmic ointment Place 1 Application into both eyes 4 (four) times daily.   famotidine 40 MG tablet Commonly known as: PEPCID Take 40 mg by mouth 2 (two) times daily.   fentaNYL 25 MCG/HR Commonly known as: [place] 1 patch onto the skin every 3 (three) days.   FLUoxetine 20 MG capsule Commonly known as: PROZAC Take 20 mg by mouth daily.   fluticasone 50 MCG/ACT nasal spray Commonly known as: FLONASE Place 2 sprays into both nostrils daily.  gabapentin 300 MG capsule Commonly known as: NEURONTIN Take 300 mg by mouth 2 (two) times daily.   hydrALAZINE 50 MG tablet Commonly known as: APRESOLINE Take 50 mg by mouth every 6 (six) hours as needed (SBP > 160 and DPB > 100).   loperamide 2 MG tablet Commonly known as: IMODIUM A-D Take 2 mg by mouth every 6 (six) hours as needed for diarrhea or loose stools.   moxifloxacin 0.5 % ophthalmic solution Commonly known as: VIGAMOX Place 1  drop into both eyes in the morning, at noon, in the evening, and at bedtime.   multivitamin with minerals Tabs tablet Take 1 tablet by mouth daily.   mupirocin ointment 2 % Commonly known as: BACTROBAN Place 1 Application into the nose 2 (two) times daily.   nitroGLYCERIN 0.4 MG SL tablet Commonly known as: NITROSTAT Place 0.4 mg under the tongue every 5 (five) minutes x 3 doses as needed for chest pain (AND CALL PROVIDER).   olopatadine 0.1 % ophthalmic solution Commonly known as: PATANOL Place 1 drop into both eyes 2 (two) times daily.   omeprazole 40 MG capsule Commonly known as: PRILOSEC Take 40 mg by mouth daily.   ondansetron 4 MG disintegrating tablet Commonly known as: ZOFRAN-ODT Take 4 mg by mouth every 6 (six) hours as needed for nausea or vomiting (DISSOLVE ORALLY).   polyethylene glycol powder 17 GM/SCOOP powder Commonly known as: GLYCOLAX/MIRALAX Take 1 Container by mouth every 12 (twelve) hours as needed for mild constipation.   sodium chloride 0.65 % Soln nasal spray Commonly known as: OCEAN Place 2 sprays into both nostrils 2 (two) times daily as needed for congestion.   Systane Balance 0.6 % Soln Generic drug: Propylene Glycol Place 1 drop into both eyes every 6 (six) hours.   venlafaxine XR 75 MG 24 hr capsule Commonly known as: EFFEXOR-XR Take 75 mg by mouth daily with breakfast.   Vraylar 4.5 MG Caps Generic drug: Cariprazine HCl Take 4.5 mg by mouth daily.   Zinc Oxide 12.8 % ointment Commonly known as: TRIPLE PASTE Apply topically 2 (two) times daily.         Signed: Candee Furbish 09/12/2022, 2:17 PM

## 2022-09-12 NOTE — Progress Notes (Signed)
Supposed to be on vent at night  Will keep in ICU pending [facility] bed  Erskine Emery MD PCCM

## 2022-09-12 NOTE — Progress Notes (Signed)
Speech Language Pathology Treatment: Dysphagia  Patient Details Name: Kim Jenkins MRN: [id number] DOB: [birth year] Today's Date: 09/12/2022 Time: 0920-0940 SLP Time Calculation (min) (ACUTE ONLY): 20 min  Assessment / Plan / Recommendation Clinical Impression  Patient seen by SLP for skilled treatment focused on dysphagia goals. When SLP entered room, patient had recently finished breakfast meal, appearing to have consumed at least 50%. RN did not report any significant difficulties with patient's PO intake. Patient requested some ice water and SLP then observed her with straw sips of water. She exhibited one instance of non-productive, dry sounding cough but otherwise continues to appear to tolerate current PO recommendations. Of note, when SLP inspecting trach following cough, inner cannula had come out slightly secondary to twist lock loose. SLP secured inner cannula and informed RN. SLP recommending for patient to continue on Dys 3 solids, thin liquids and will continue to follow her for diet toleration.   HPI HPI: Patient is a 56 y.o. female with PMH: obesity, chronic trach (PMV during day, vent at night), DM-2, OSA/OHS, depression, HTN, bipolar 1 disorder. She is a resident at [place] [facility] and was brought to ER on 09/09/22 due to AMS requiring her to remain on ventilator all day. She was in the ER on 12/10 for AMS and given augmentin for concern of pneumonia due to aspiration. During current hospitalization, CT head negative for acute abnormality, CT chest showed bibasilar dependent consolidation, negative for PE.      SLP Plan  Continue with current plan of care      Recommendations for follow up therapy are one component of a multi-disciplinary discharge planning process, led by the attending physician.  Recommendations may be updated based on patient status, additional functional criteria and insurance authorization.    Recommendations  Diet recommendations: Dysphagia 3 (mechanical  soft);Thin liquid Liquids provided via: Cup;Straw Medication Administration: Whole meds with puree Supervision: Intermittent supervision to cue for compensatory strategies;Patient able to self feed Compensations: Slow rate;Small sips/bites Postural Changes and/or Swallow Maneuvers: Seated upright 90 degrees      Patient may use Passy-Muir Speech Valve: During all waking hours (remove during sleep);During PO intake/meals PMSV Supervision: Intermittent         Oral Care Recommendations: Oral care BID;Staff/trained caregiver to provide oral care Follow Up Recommendations: SLP at Long-term acute care hospital Assistance recommended at discharge: Frequent or constant Supervision/Assistance SLP Visit Diagnosis: Dysphagia, unspecified (R13.10) Plan: Continue with current plan of care           [name], MA, CCC-SLP Speech Therapy

## 2022-09-14 LAB — CULTURE, RESPIRATORY W GRAM STAIN

## 2022-09-19 DIAGNOSIS — R000 Tachycardia, unspecified: Secondary | ICD-10-CM

## 2022-09-19 DIAGNOSIS — E662 Morbid (severe) obesity with alveolar hypoventilation: Secondary | ICD-10-CM

## 2022-09-19 DIAGNOSIS — G4733 Obstructive sleep apnea (adult) (pediatric): Secondary | ICD-10-CM

## 2022-09-19 DIAGNOSIS — J9621 Acute and chronic respiratory failure with hypoxia: Secondary | ICD-10-CM

## 2022-09-19 DIAGNOSIS — E119 Type 2 diabetes mellitus without complications: Secondary | ICD-10-CM

## 2022-09-19 DIAGNOSIS — F319 Bipolar disorder, unspecified: Secondary | ICD-10-CM

## 2022-10-03 DIAGNOSIS — E119 Type 2 diabetes mellitus without complications: Secondary | ICD-10-CM

## 2022-10-03 DIAGNOSIS — G4733 Obstructive sleep apnea (adult) (pediatric): Secondary | ICD-10-CM

## 2022-10-03 DIAGNOSIS — N390 Urinary tract infection, site not specified: Secondary | ICD-10-CM

## 2022-10-03 DIAGNOSIS — F319 Bipolar disorder, unspecified: Secondary | ICD-10-CM

## 2022-10-03 DIAGNOSIS — J9621 Acute and chronic respiratory failure with hypoxia: Secondary | ICD-10-CM

## 2022-10-03 DIAGNOSIS — E662 Morbid (severe) obesity with alveolar hypoventilation: Secondary | ICD-10-CM

## 2022-10-17 ENCOUNTER — Other Ambulatory Visit: Payer: Self-pay | Admitting: Internal Medicine

## 2022-10-17 DIAGNOSIS — F319 Bipolar disorder, unspecified: Secondary | ICD-10-CM

## 2022-10-17 DIAGNOSIS — E662 Morbid (severe) obesity with alveolar hypoventilation: Secondary | ICD-10-CM

## 2022-10-17 DIAGNOSIS — N390 Urinary tract infection, site not specified: Secondary | ICD-10-CM

## 2022-10-17 DIAGNOSIS — G4733 Obstructive sleep apnea (adult) (pediatric): Secondary | ICD-10-CM

## 2022-10-17 DIAGNOSIS — E119 Type 2 diabetes mellitus without complications: Secondary | ICD-10-CM

## 2022-10-17 DIAGNOSIS — J9621 Acute and chronic respiratory failure with hypoxia: Secondary | ICD-10-CM

## 2022-10-25 ENCOUNTER — Encounter (HOSPITAL_COMMUNITY): Payer: Self-pay

## 2022-10-25 ENCOUNTER — Emergency Department (HOSPITAL_COMMUNITY): Payer: Medicare Other

## 2022-10-25 ENCOUNTER — Other Ambulatory Visit: Payer: Self-pay

## 2022-10-25 ENCOUNTER — Inpatient Hospital Stay (HOSPITAL_COMMUNITY)
Admission: EM | Admit: 2022-10-25 | Discharge: 2022-10-27 | DRG: 208 | Disposition: A | Discharge status: Other Acute Inpatient Hospital | Payer: Medicare Other | Source: Other Acute Inpatient Hospital | Attending: Internal Medicine | Admitting: Internal Medicine

## 2022-10-25 DIAGNOSIS — J9620 Acute and chronic respiratory failure, unspecified whether with hypoxia or hypercapnia: Secondary | ICD-10-CM | POA: Diagnosis present

## 2022-10-25 DIAGNOSIS — L89229 Pressure ulcer of left hip, unspecified stage: Secondary | ICD-10-CM | POA: Diagnosis not present

## 2022-10-25 DIAGNOSIS — Z79899 Other long term (current) drug therapy: Secondary | ICD-10-CM

## 2022-10-25 DIAGNOSIS — Z888 Allergy status to other drugs, medicaments and biological substances status: Secondary | ICD-10-CM

## 2022-10-25 DIAGNOSIS — I959 Hypotension, unspecified: Secondary | ICD-10-CM | POA: Diagnosis present

## 2022-10-25 DIAGNOSIS — Z8701 Personal history of pneumonia (recurrent): Secondary | ICD-10-CM

## 2022-10-25 DIAGNOSIS — R110 Nausea: Secondary | ICD-10-CM | POA: Diagnosis not present

## 2022-10-25 DIAGNOSIS — J9811 Atelectasis: Secondary | ICD-10-CM | POA: Diagnosis not present

## 2022-10-25 DIAGNOSIS — I10 Essential (primary) hypertension: Secondary | ICD-10-CM | POA: Diagnosis not present

## 2022-10-25 DIAGNOSIS — Z1152 Encounter for screening for COVID-19: Secondary | ICD-10-CM | POA: Diagnosis not present

## 2022-10-25 DIAGNOSIS — Z95828 Presence of other vascular implants and grafts: Secondary | ICD-10-CM

## 2022-10-25 DIAGNOSIS — Z9911 Dependence on respirator [ventilator] status: Secondary | ICD-10-CM

## 2022-10-25 DIAGNOSIS — Z930 Tracheostomy status: Secondary | ICD-10-CM

## 2022-10-25 DIAGNOSIS — F319 Bipolar disorder, unspecified: Secondary | ICD-10-CM | POA: Diagnosis present

## 2022-10-25 DIAGNOSIS — J9622 Acute and chronic respiratory failure with hypercapnia: Secondary | ICD-10-CM | POA: Diagnosis not present

## 2022-10-25 DIAGNOSIS — E662 Morbid (severe) obesity with alveolar hypoventilation: Secondary | ICD-10-CM | POA: Diagnosis present

## 2022-10-25 DIAGNOSIS — L249 Irritant contact dermatitis, unspecified cause: Secondary | ICD-10-CM | POA: Diagnosis present

## 2022-10-25 DIAGNOSIS — Z87442 Personal history of urinary calculi: Secondary | ICD-10-CM | POA: Diagnosis not present

## 2022-10-25 DIAGNOSIS — G8929 Other chronic pain: Secondary | ICD-10-CM | POA: Diagnosis present

## 2022-10-25 DIAGNOSIS — Z881 Allergy status to other antibiotic agents status: Secondary | ICD-10-CM

## 2022-10-25 DIAGNOSIS — J189 Pneumonia, unspecified organism: Principal | ICD-10-CM

## 2022-10-25 DIAGNOSIS — E119 Type 2 diabetes mellitus without complications: Secondary | ICD-10-CM | POA: Diagnosis not present

## 2022-10-25 DIAGNOSIS — J9621 Acute and chronic respiratory failure with hypoxia: Secondary | ICD-10-CM | POA: Diagnosis present

## 2022-10-25 DIAGNOSIS — D649 Anemia, unspecified: Secondary | ICD-10-CM | POA: Diagnosis not present

## 2022-10-25 DIAGNOSIS — Z86711 Personal history of pulmonary embolism: Secondary | ICD-10-CM | POA: Diagnosis not present

## 2022-10-25 DIAGNOSIS — D72819 Decreased white blood cell count, unspecified: Secondary | ICD-10-CM | POA: Diagnosis present

## 2022-10-25 LAB — CBC WITH DIFFERENTIAL/PLATELET
Abs Immature Granulocytes: 0.02 10*3/uL (ref 0.00–0.07)
Basophils Absolute: 0 10*3/uL (ref 0.0–0.1)
Basophils Relative: 0 %
Eosinophils Absolute: 0.1 10*3/uL (ref 0.0–0.5)
Eosinophils Relative: 1 %
HCT: 37.4 % (ref 36.0–46.0)
Hemoglobin: 11.3 g/dL — ABNORMAL LOW (ref 12.0–15.0)
Immature Granulocytes: 0 %
Lymphocytes Relative: 28 %
Lymphs Abs: 1.7 10*3/uL (ref 0.7–4.0)
MCH: 27.4 pg (ref 26.0–34.0)
MCHC: 30.2 g/dL (ref 30.0–36.0)
MCV: 90.8 fL (ref 80.0–100.0)
Monocytes Absolute: 0.6 10*3/uL (ref 0.1–1.0)
Monocytes Relative: 10 %
Neutro Abs: 3.5 10*3/uL (ref 1.7–7.7)
Neutrophils Relative %: 61 %
Platelets: 147 10*3/uL — ABNORMAL LOW (ref 150–400)
RBC: 4.12 MIL/uL (ref 3.87–5.11)
RDW: 14.7 % (ref 11.5–15.5)
WBC: 5.9 10*3/uL (ref 4.0–10.5)
nRBC: 0 % (ref 0.0–0.2)

## 2022-10-25 LAB — COMPREHENSIVE METABOLIC PANEL
ALT: 10 U/L (ref 0–44)
AST: 14 U/L — ABNORMAL LOW (ref 15–41)
Albumin: 2.6 g/dL — ABNORMAL LOW (ref 3.5–5.0)
Alkaline Phosphatase: 43 U/L (ref 38–126)
Anion gap: 9 (ref 5–15)
BUN: 12 mg/dL (ref 6–20)
CO2: 31 mmol/L (ref 22–32)
Calcium: 8.9 mg/dL (ref 8.9–10.3)
Chloride: 96 mmol/L — ABNORMAL LOW (ref 98–111)
Creatinine, Ser: 0.67 mg/dL (ref 0.44–1.00)
GFR, Estimated: >60 mL/min (ref >60)
Glucose, Bld: 161 mg/dL — ABNORMAL HIGH (ref 70–99)
Potassium: 4.1 mmol/L (ref 3.5–5.1)
Sodium: 136 mmol/L (ref 135–145)
Total Bilirubin: 0.1 mg/dL — ABNORMAL LOW (ref 0.3–1.2)
Total Protein: 7.1 g/dL (ref 6.5–8.1)

## 2022-10-25 LAB — RESP PANEL BY RT-PCR (RSV, FLU A&B, COVID)  RVPGX2
Influenza A by PCR: NEGATIVE
Influenza B by PCR: NEGATIVE
Resp Syncytial Virus by PCR: NEGATIVE
SARS Coronavirus 2 by RT PCR: NEGATIVE

## 2022-10-25 LAB — I-STAT ARTERIAL BLOOD GAS, ED
Acid-Base Excess: 10 mmol/L — ABNORMAL HIGH (ref 0.0–2.0)
Bicarbonate: 37.9 mmol/L — ABNORMAL HIGH (ref 20.0–28.0)
Calcium, Ion: 1.32 mmol/L (ref 1.15–1.40)
HCT: 33 % — ABNORMAL LOW (ref 36.0–46.0)
Hemoglobin: 11.2 g/dL — ABNORMAL LOW (ref 12.0–15.0)
O2 Saturation: 88 %
Patient temperature: 37
Potassium: 4.3 mmol/L (ref 3.5–5.1)
Sodium: 137 mmol/L (ref 135–145)
TCO2: 40 mmol/L — ABNORMAL HIGH (ref 22–32)
pCO2 arterial: 69.4 mmHg (ref 32–48)
pH, Arterial: 7.345 — ABNORMAL LOW (ref 7.35–7.45)
pO2, Arterial: 60 mmHg — ABNORMAL LOW (ref 83–108)

## 2022-10-25 LAB — BRAIN NATRIURETIC PEPTIDE: B Natriuretic Peptide: 285.7 pg/mL — ABNORMAL HIGH (ref 0.0–100.0)

## 2022-10-25 LAB — TROPONIN I (HIGH SENSITIVITY): Troponin I (High Sensitivity): 11 ng/L (ref <18)

## 2022-10-25 LAB — VALPROIC ACID LEVEL: Valproic Acid Lvl: 87 ug/mL (ref 50.0–100.0)

## 2022-10-25 LAB — LACTIC ACID, PLASMA: Lactic Acid, Venous: 1.2 mmol/L (ref 0.5–1.9)

## 2022-10-25 NOTE — ED Triage Notes (Signed)
Pt bib [facility] from [facility] with complaints of chest pain that started 2 hours prior to arrival. Pt states pain is centralized but also has left shoulder pain. Pt arrives on trach collar and is on vent at night.  Pt was given '50mg'$  Hydralazine prior to EMS arrival for a bp of 173/101.

## 2022-10-25 NOTE — ED Provider Notes (Signed)
[facility] Provider Note   CSN: [id number] Arrival date & time: 10/25/22  2157     History  Chief Complaint  Patient presents with   Chest Pain    Kim Jenkins is a 57 y.o. female.  The history is provided by the patient and medical records.  Chest Pain  57 y.o. F with hx of chronic respiratory failure with tracheostomy (vent dependence nightly), anemia, DM2, depression, OSA, bipolar disorder, presenitng to the ED with chest pain.  States this began approx 2 hours PTA.  Points to central/substernal area of chest as location of pain.  Denies SOB but tracheostomy seems to have a bit of secretions.  No reported cough/fever but does have hx of frequent pneumonia.  She was given hydralazine 80m just PTA due to elevated BP-- pressure 139/98 on arrival.  Home Medications Prior to Admission medications   Medication Sig Start Date End Date Taking? Authorizing Provider  divalproex (DEPAKOTE ER) 500 MG 24 hr tablet Take 2 tablets (1,000 mg total) by mouth every 12 (twelve) hours. 09/13/18   Ward, JOzella Almond PA-C  erythromycin ophthalmic ointment Place 1 Application into both eyes 4 (four) times daily.    [provider]  famotidine (PEPCID) 40 MG tablet Take 40 mg by mouth 2 (two) times daily.    [provider]  fentaNYL (DURAGESIC) 25 MCG/HR Place 1 patch onto the skin every 3 (three) days. 08/03/22   [provider]  FLUoxetine (PROZAC) 20 MG capsule Take 20 mg by mouth daily. 08/25/22   [provider]  fluticasone (FLONASE) 50 MCG/ACT nasal spray Place 2 sprays into both nostrils daily.     [provider]  gabapentin (NEURONTIN) 300 MG capsule Take 300 mg by mouth 2 (two) times daily. 08/11/22   [provider]  hydrALAZINE (APRESOLINE) 50 MG tablet Take 50 mg by mouth every 6 (six) hours as needed (SBP > 160 and DPB > 100).    [provider]  loperamide (IMODIUM A-D) 2 MG tablet  Take 2 mg by mouth every 6 (six) hours as needed for diarrhea or loose stools.     [provider]  moxifloxacin (VIGAMOX) 0.5 % ophthalmic solution Place 1 drop into both eyes in the morning, at noon, in the evening, and at bedtime.    [provider]  Multiple Vitamin (MULTIVITAMIN WITH MINERALS) TABS tablet Take 1 tablet by mouth daily.    [provider]  mupirocin ointment (BACTROBAN) 2 % Place 1 Application into the nose 2 (two) times daily. 09/12/22   SCandee Furbish MD  nitroGLYCERIN (NITROSTAT) 0.4 MG SL tablet Place 0.4 mg under the tongue every 5 (five) minutes x 3 doses as needed for chest pain (AND CALL PROVIDER).     [provider]  olopatadine (PATANOL) 0.1 % ophthalmic solution Place 1 drop into both eyes 2 (two) times daily.    [provider]  omeprazole (PRILOSEC) 40 MG capsule Take 40 mg by mouth daily. 08/12/22   [provider]  ondansetron (ZOFRAN-ODT) 4 MG disintegrating tablet Take 4 mg by mouth every 6 (six) hours as needed for nausea or vomiting (DISSOLVE ORALLY).    [provider]  polyethylene glycol powder (GLYCOLAX/MIRALAX) 17 GM/SCOOP powder Take 1 Container by mouth every 12 (twelve) hours as needed for mild constipation.    [provider]  Propylene Glycol (SYSTANE BALANCE) 0.6 % SOLN Place 1 drop into both eyes every 6 (six)  hours.     [provider]  sodium chloride (OCEAN) 0.65 % SOLN nasal spray Place 2 sprays into both nostrils 2 (two) times daily as needed for congestion.    [provider]  venlafaxine XR (EFFEXOR-XR) 75 MG 24 hr capsule Take 75 mg by mouth daily with breakfast.    [provider]  VRAYLAR 4.5 MG CAPS Take 4.5 mg by mouth daily. 08/16/22   [provider]  Zinc Oxide (TRIPLE PASTE) 12.8 % ointment Apply topically 2 (two) times daily. 09/12/22   Candee Furbish, MD      Allergies    Meperidine and related and Cefazolin    Review  of Systems   Review of Systems  Cardiovascular:  Positive for chest pain.  All other systems reviewed and are negative.   Physical Exam Updated Vital Signs BP 126/74   Pulse 86   Temp 98.6 F (37 C) (Oral)   Resp 18   SpO2 96%   Physical Exam Vitals and nursing note reviewed.  Constitutional:      Appearance: She is well-developed. She is obese.  HENT:     Head: Normocephalic and atraumatic.  Eyes:     Conjunctiva/sclera: Conjunctivae normal.     Pupils: Pupils are equal, round, and reactive to light.  Neck:     Comments: Tracheostomy in place, seems to have some increased secretions Cardiovascular:     Rate and Rhythm: Normal rate and regular rhythm.     Heart sounds: Normal heart sounds.  Pulmonary:     Effort: Pulmonary effort is normal.     Breath sounds: Wheezing and rhonchi present.  Abdominal:     General: Bowel sounds are normal.     Palpations: Abdomen is soft.  Musculoskeletal:        General: Normal range of motion.     Cervical back: Normal range of motion.  Skin:    General: Skin is warm and dry.  Neurological:     Mental Status: She is alert and oriented to person, place, and time.     ED Results / Procedures / Treatments   Labs (all labs ordered are listed, but only abnormal results are displayed) Labs Reviewed  CBC WITH DIFFERENTIAL/PLATELET - Abnormal; Notable for the following components:      Result Value   Hemoglobin 11.3 (*)    Platelets 147 (*)    All other components within normal limits  COMPREHENSIVE METABOLIC PANEL - Abnormal; Notable for the following components:   Chloride 96 (*)    Glucose, Bld 161 (*)    Albumin 2.6 (*)    AST 14 (*)    Total Bilirubin 0.1 (*)    All other components within normal limits  BRAIN NATRIURETIC PEPTIDE - Abnormal; Notable for the following components:   B Natriuretic Peptide 285.7 (*)    All other components within normal limits  I-STAT ARTERIAL BLOOD GAS, ED - Abnormal; Notable for the  following components:   pH, Arterial 7.345 (*)    pCO2 arterial 69.4 (*)    pO2, Arterial 60 (*)    Bicarbonate 37.9 (*)    TCO2 40 (*)    Acid-Base Excess 10.0 (*)    HCT 33.0 (*)    Hemoglobin 11.2 (*)    All other components within normal limits  CBG MONITORING, ED - Abnormal; Notable for the following components:   Glucose-Capillary 110 (*)    All other components within normal limits  RESP PANEL BY RT-PCR (  RSV, FLU A&B, COVID)  RVPGX2  CULTURE, BLOOD (ROUTINE X 2)  CULTURE, BLOOD (ROUTINE X 2)  MRSA NEXT GEN BY PCR, NASAL  LACTIC ACID, PLASMA  LACTIC ACID, PLASMA  VALPROIC ACID LEVEL  CBC  CREATININE, SERUM  STREP PNEUMONIAE URINARY ANTIGEN  LEGIONELLA PNEUMOPHILA SEROGP 1 UR AG  CBC  BASIC METABOLIC PANEL  MAGNESIUM  PHOSPHORUS  HEMOGLOBIN A1C  PROCALCITONIN  TROPONIN I (HIGH SENSITIVITY)  TROPONIN I (HIGH SENSITIVITY)    EKG EKG Interpretation  Date/Time:  Wednesday October 25 2022 22:03:49 EST Ventricular Rate:  90 PR Interval:  133 QRS Duration: 79 QT Interval:  383 QTC Calculation: 469 R Axis:   54 Text Interpretation: Sinus rhythm Abnormal R-wave progression, early transition When compared with ECG of 09/10/2022, HEART RATE has increased Confirmed by Delora Fuel ([id number]) on 10/26/2022 12:44:05 AM  Radiology DG Chest Port 1 View  Result Date: 10/25/2022 CLINICAL DATA:  Cough and chest pain.  Tracheostomy. EXAM: PORTABLE CHEST 1 VIEW COMPARISON:  09/09/2022 FINDINGS: Patient rotation limits examination. Tracheostomy tube appears in place. Heart size and pulmonary vascularity are normal for technique. Dense triangular opacity in the left medial chest suggesting collapse or consolidation, new since prior study. Right lung is clear. No pleural effusions. No pneumothorax. A right PICC line appears in place without change since prior study. IMPRESSION: Triangular shaped opacity in the left lower lung medially suggesting consolidation or atelectasis. Electronically  Signed   By: Lucienne Capers M.D.   On: 10/25/2022 22:37    Procedures Procedures    CRITICAL CARE Performed by: Larene Pickett   Total critical care time: 45 minutes  Critical care time was exclusive of separately billable procedures and treating other patients.  Critical care was necessary to treat or prevent imminent or life-threatening deterioration.  Critical care was time spent personally by me on the following activities: development of treatment plan with patient and/or surrogate as well as nursing, discussions with consultants, evaluation of patient's response to treatment, examination of patient, obtaining history from patient or surrogate, ordering and performing treatments and interventions, ordering and review of laboratory studies, ordering and review of radiographic studies, pulse oximetry and re-evaluation of patient's condition.   Medications Ordered in ED Medications  vancomycin (VANCOREADY) IVPB 2000 mg/400 mL (2,000 mg Intravenous New Bag/Given 10/26/22 0103)  docusate sodium (COLACE) capsule 100 mg (has no administration in time range)  polyethylene glycol (MIRALAX / GLYCOLAX) packet 17 g (has no administration in time range)  heparin injection 5,000 Units (has no administration in time range)  pantoprazole (PROTONIX) injection 40 mg (has no administration in time range)  ondansetron (ZOFRAN) injection 4 mg (has no administration in time range)  insulin aspart (novoLOG) injection 0-15 Units ( Subcutaneous Not Given 10/26/22 0146)  acetaminophen (TYLENOL) tablet 650 mg (has no administration in time range)  vancomycin (VANCOREADY) IVPB 1250 mg/250 mL (has no administration in time range)  ceFEPIme (MAXIPIME) 2 g in sodium chloride 0.9 % 100 mL IVPB (has no administration in time range)  ceFEPIme (MAXIPIME) 2 g in sodium chloride 0.9 % 100 mL IVPB (0 g Intravenous Stopped 10/26/22 0103)    ED Course/ Medical Decision Making/ A&P                             Medical  Decision Making Amount and/or Complexity of Data Reviewed Labs: ordered. Radiology: ordered and independent interpretation performed. ECG/medicine tests: ordered and independent interpretation performed.  Risk  Prescription drug management. Decision regarding hospitalization.   57 year old female presenting to the ED with chest pain.  History of obesity and chronic trach dependence.  Points to central/substernal area as location of pain.  Lurline Idol does have some increased secretions.  RT to suction.  EKG without acute ischemia.  Labs, CXR, RVP ordered.    Labs as above--no leukocytosis, normal lactate.  No significant electrolyte derangement.  RVP is negative.  Chest x-ray with left lower lobe infiltrate, suspicious for pneumonia.  ABG with pCO2 of 69-- RT has adjusted vent settings for night time.  Started IV vanc/cefepime.  Discussed with PCCM given her vent dependence-- they have evaluated in the ED and will admit for ongoing care.  Final Clinical Impression(s) / ED Diagnoses Final diagnoses:  Pneumonia of left lower lobe due to infectious organism    Rx / DC Orders ED Discharge Orders     None         Larene Pickett, PA-C 10/26/22 [place], [place], DO 10/27/22 [phone]

## 2022-10-26 DIAGNOSIS — Z1152 Encounter for screening for COVID-19: Secondary | ICD-10-CM | POA: Diagnosis not present

## 2022-10-26 DIAGNOSIS — Z8701 Personal history of pneumonia (recurrent): Secondary | ICD-10-CM | POA: Diagnosis not present

## 2022-10-26 DIAGNOSIS — R110 Nausea: Secondary | ICD-10-CM | POA: Diagnosis present

## 2022-10-26 DIAGNOSIS — Z930 Tracheostomy status: Secondary | ICD-10-CM | POA: Diagnosis not present

## 2022-10-26 DIAGNOSIS — Z95828 Presence of other vascular implants and grafts: Secondary | ICD-10-CM | POA: Diagnosis not present

## 2022-10-26 DIAGNOSIS — Z86711 Personal history of pulmonary embolism: Secondary | ICD-10-CM | POA: Diagnosis not present

## 2022-10-26 DIAGNOSIS — J9811 Atelectasis: Secondary | ICD-10-CM | POA: Diagnosis present

## 2022-10-26 DIAGNOSIS — L89229 Pressure ulcer of left hip, unspecified stage: Secondary | ICD-10-CM | POA: Diagnosis present

## 2022-10-26 DIAGNOSIS — J9690 Respiratory failure, unspecified, unspecified whether with hypoxia or hypercapnia: Secondary | ICD-10-CM | POA: Diagnosis not present

## 2022-10-26 DIAGNOSIS — G8929 Other chronic pain: Secondary | ICD-10-CM | POA: Diagnosis present

## 2022-10-26 DIAGNOSIS — R079 Chest pain, unspecified: Secondary | ICD-10-CM | POA: Diagnosis not present

## 2022-10-26 DIAGNOSIS — J9620 Acute and chronic respiratory failure, unspecified whether with hypoxia or hypercapnia: Secondary | ICD-10-CM | POA: Diagnosis present

## 2022-10-26 DIAGNOSIS — D72819 Decreased white blood cell count, unspecified: Secondary | ICD-10-CM | POA: Diagnosis present

## 2022-10-26 DIAGNOSIS — Z888 Allergy status to other drugs, medicaments and biological substances status: Secondary | ICD-10-CM | POA: Diagnosis not present

## 2022-10-26 DIAGNOSIS — Z79899 Other long term (current) drug therapy: Secondary | ICD-10-CM | POA: Diagnosis not present

## 2022-10-26 DIAGNOSIS — F319 Bipolar disorder, unspecified: Secondary | ICD-10-CM | POA: Diagnosis present

## 2022-10-26 DIAGNOSIS — J9601 Acute respiratory failure with hypoxia: Secondary | ICD-10-CM | POA: Diagnosis not present

## 2022-10-26 DIAGNOSIS — J9621 Acute and chronic respiratory failure with hypoxia: Secondary | ICD-10-CM | POA: Diagnosis present

## 2022-10-26 DIAGNOSIS — R000 Tachycardia, unspecified: Secondary | ICD-10-CM | POA: Diagnosis not present

## 2022-10-26 DIAGNOSIS — I959 Hypotension, unspecified: Secondary | ICD-10-CM | POA: Diagnosis present

## 2022-10-26 DIAGNOSIS — L249 Irritant contact dermatitis, unspecified cause: Secondary | ICD-10-CM | POA: Diagnosis present

## 2022-10-26 DIAGNOSIS — Z881 Allergy status to other antibiotic agents status: Secondary | ICD-10-CM | POA: Diagnosis not present

## 2022-10-26 DIAGNOSIS — J9622 Acute and chronic respiratory failure with hypercapnia: Secondary | ICD-10-CM | POA: Diagnosis present

## 2022-10-26 DIAGNOSIS — Z87442 Personal history of urinary calculi: Secondary | ICD-10-CM | POA: Diagnosis not present

## 2022-10-26 DIAGNOSIS — E119 Type 2 diabetes mellitus without complications: Secondary | ICD-10-CM | POA: Diagnosis present

## 2022-10-26 DIAGNOSIS — I10 Essential (primary) hypertension: Secondary | ICD-10-CM | POA: Diagnosis present

## 2022-10-26 DIAGNOSIS — Z9911 Dependence on respirator [ventilator] status: Secondary | ICD-10-CM | POA: Diagnosis not present

## 2022-10-26 DIAGNOSIS — D649 Anemia, unspecified: Secondary | ICD-10-CM | POA: Diagnosis present

## 2022-10-26 DIAGNOSIS — E662 Morbid (severe) obesity with alveolar hypoventilation: Secondary | ICD-10-CM | POA: Diagnosis present

## 2022-10-26 LAB — CBC
HCT: 33.8 % — ABNORMAL LOW (ref 36.0–46.0)
Hemoglobin: 10.5 g/dL — ABNORMAL LOW (ref 12.0–15.0)
MCH: 27.9 pg (ref 26.0–34.0)
MCHC: 31.1 g/dL (ref 30.0–36.0)
MCV: 89.9 fL (ref 80.0–100.0)
Platelets: 167 10*3/uL (ref 150–400)
RBC: 3.76 MIL/uL — ABNORMAL LOW (ref 3.87–5.11)
RDW: 14.7 % (ref 11.5–15.5)
WBC: 6.4 10*3/uL (ref 4.0–10.5)
nRBC: 0 % (ref 0.0–0.2)

## 2022-10-26 LAB — BASIC METABOLIC PANEL
Anion gap: 9 (ref 5–15)
BUN: 14 mg/dL (ref 6–20)
CO2: 33 mmol/L — ABNORMAL HIGH (ref 22–32)
Calcium: 8.8 mg/dL — ABNORMAL LOW (ref 8.9–10.3)
Chloride: 94 mmol/L — ABNORMAL LOW (ref 98–111)
Creatinine, Ser: 0.58 mg/dL (ref 0.44–1.00)
GFR, Estimated: >60 mL/min (ref >60)
Glucose, Bld: 109 mg/dL — ABNORMAL HIGH (ref 70–99)
Potassium: 4.1 mmol/L (ref 3.5–5.1)
Sodium: 136 mmol/L (ref 135–145)

## 2022-10-26 LAB — GLUCOSE, CAPILLARY
Glucose-Capillary: 113 mg/dL — ABNORMAL HIGH (ref 70–99)
Glucose-Capillary: 104 mg/dL — ABNORMAL HIGH (ref 70–99)
Glucose-Capillary: 131 mg/dL — ABNORMAL HIGH (ref 70–99)
Glucose-Capillary: 111 mg/dL — ABNORMAL HIGH (ref 70–99)
Glucose-Capillary: 150 mg/dL — ABNORMAL HIGH (ref 70–99)
Glucose-Capillary: 123 mg/dL — ABNORMAL HIGH (ref 70–99)
Glucose-Capillary: 140 mg/dL — ABNORMAL HIGH (ref 70–99)

## 2022-10-26 LAB — STREP PNEUMONIAE URINARY ANTIGEN: Strep Pneumo Urinary Antigen: NEGATIVE

## 2022-10-26 LAB — MAGNESIUM: Magnesium: 1.8 mg/dL (ref 1.7–2.4)

## 2022-10-26 LAB — HEMOGLOBIN A1C
Hgb A1c MFr Bld: 5.4 % (ref 4.8–5.6)
Mean Plasma Glucose: 108.28 mg/dL

## 2022-10-26 LAB — CBG MONITORING, ED: Glucose-Capillary: 110 mg/dL — ABNORMAL HIGH (ref 70–99)

## 2022-10-26 LAB — PROCALCITONIN: Procalcitonin: 0.11 ng/mL

## 2022-10-26 LAB — TROPONIN I (HIGH SENSITIVITY): Troponin I (High Sensitivity): 16 ng/L (ref <18)

## 2022-10-26 LAB — LACTIC ACID, PLASMA: Lactic Acid, Venous: 0.9 mmol/L (ref 0.5–1.9)

## 2022-10-26 LAB — PHOSPHORUS: Phosphorus: 3.5 mg/dL (ref 2.5–4.6)

## 2022-10-26 LAB — MRSA NEXT GEN BY PCR, NASAL: MRSA by PCR Next Gen: DETECTED — AB

## 2022-10-26 MED ORDER — IPRATROPIUM-ALBUTEROL 0.5-2.5 (3) MG/3ML IN SOLN
3.0000 mL | Freq: Four times a day (QID) | RESPIRATORY_TRACT | Status: DC
  Administered 2022-10-26 – 2022-10-27 (×5): 3 mL via RESPIRATORY_TRACT
  Filled 2022-10-26 (×5): qty 3

## 2022-10-26 MED ORDER — ONDANSETRON HCL 4 MG/2ML IJ SOLN: 4.0000 mg | Freq: Four times a day (QID) | INTRAMUSCULAR | Status: DC | PRN

## 2022-10-26 MED ORDER — LACTATED RINGERS IV BOLUS
1000.0000 mL | Freq: Once | INTRAVENOUS | Status: AC
  Administered 2022-10-26: 1000 mL via INTRAVENOUS

## 2022-10-26 MED ORDER — HYDRALAZINE HCL 20 MG/ML IJ SOLN
10.0000 mg | INTRAMUSCULAR | Status: DC | PRN
  Filled 2022-10-26: qty 1

## 2022-10-26 MED ORDER — ALBUMIN HUMAN 25 % IV SOLN
25.0000 g | Freq: Once | INTRAVENOUS | Status: AC
  Administered 2022-10-26: 25 g via INTRAVENOUS
  Filled 2022-10-26: qty 100

## 2022-10-26 MED ORDER — SODIUM CHLORIDE 0.9 % IV SOLN
2.0000 g | Freq: Once | INTRAVENOUS | Status: AC
  Administered 2022-10-26: 2 g via INTRAVENOUS
  Filled 2022-10-26: qty 12.5

## 2022-10-26 MED ORDER — SODIUM CHLORIDE 3 % IN NEBU
4.0000 mL | INHALATION_SOLUTION | Freq: Two times a day (BID) | RESPIRATORY_TRACT | Status: DC
  Administered 2022-10-26 – 2022-10-27 (×3): 4 mL via RESPIRATORY_TRACT
  Filled 2022-10-26 (×4): qty 4

## 2022-10-26 MED ORDER — INSULIN ASPART 100 UNIT/ML IJ SOLN
0.0000 [IU] | INTRAMUSCULAR | Status: DC
  Administered 2022-10-26 – 2022-10-27 (×5): 2 [IU] via SUBCUTANEOUS

## 2022-10-26 MED ORDER — HEPARIN SODIUM (PORCINE) 5000 UNIT/ML IJ SOLN
5000.0000 [IU] | Freq: Three times a day (TID) | INTRAMUSCULAR | Status: DC
  Administered 2022-10-26 – 2022-10-27 (×3): 5000 [IU] via SUBCUTANEOUS
  Filled 2022-10-26 (×3): qty 1

## 2022-10-26 MED ORDER — OXYCODONE HCL 5 MG PO TABS
5.0000 mg | ORAL_TABLET | Freq: Four times a day (QID) | ORAL | Status: DC | PRN
  Administered 2022-10-26 – 2022-10-27 (×4): 5 mg via ORAL
  Filled 2022-10-26 (×4): qty 1

## 2022-10-26 MED ORDER — SODIUM CHLORIDE 0.9 % IV SOLN
2.0000 g | Freq: Three times a day (TID) | INTRAVENOUS | Status: DC
  Administered 2022-10-26: 2 g via INTRAVENOUS
  Filled 2022-10-26: qty 12.5

## 2022-10-26 MED ORDER — ACETAMINOPHEN 325 MG PO TABS
650.0000 mg | ORAL_TABLET | Freq: Four times a day (QID) | ORAL | Status: DC | PRN
  Administered 2022-10-26: 650 mg via ORAL
  Filled 2022-10-26: qty 2

## 2022-10-26 MED ORDER — FENTANYL 25 MCG/HR TD PT72
1.0000 | MEDICATED_PATCH | TRANSDERMAL | Status: DC
  Administered 2022-10-26: 1 via TRANSDERMAL
  Filled 2022-10-26: qty 1

## 2022-10-26 MED ORDER — PANTOPRAZOLE SODIUM 40 MG IV SOLR
40.0000 mg | Freq: Every day | INTRAVENOUS | Status: DC
  Administered 2022-10-26 – 2022-10-27 (×2): 40 mg via INTRAVENOUS
  Filled 2022-10-26 (×2): qty 10

## 2022-10-26 MED ORDER — CHLORHEXIDINE GLUCONATE CLOTH 2 % EX PADS
6.0000 | MEDICATED_PAD | Freq: Every day | CUTANEOUS | Status: DC
  Administered 2022-10-26 – 2022-10-27 (×2): 6 via TOPICAL

## 2022-10-26 MED ORDER — DOCUSATE SODIUM 100 MG PO CAPS: 100.0000 mg | ORAL_CAPSULE | Freq: Two times a day (BID) | ORAL | Status: DC | PRN

## 2022-10-26 MED ORDER — NOREPINEPHRINE 4 MG/250ML-% IV SOLN
0.0000 ug/min | INTRAVENOUS | Status: DC
  Administered 2022-10-26: 2 ug/min via INTRAVENOUS
  Filled 2022-10-26: qty 250

## 2022-10-26 MED ORDER — VANCOMYCIN HCL 2000 MG/400ML IV SOLN
2000.0000 mg | Freq: Once | INTRAVENOUS | Status: AC
  Administered 2022-10-26: 2000 mg via INTRAVENOUS
  Filled 2022-10-26: qty 400

## 2022-10-26 MED ORDER — VANCOMYCIN HCL 1250 MG/250ML IV SOLN
1250.0000 mg | Freq: Two times a day (BID) | INTRAVENOUS | Status: DC
  Filled 2022-10-26: qty 250

## 2022-10-26 MED ORDER — MAGNESIUM SULFATE 2 GM/50ML IV SOLN
2.0000 g | Freq: Once | INTRAVENOUS | Status: AC
  Administered 2022-10-26: 2 g via INTRAVENOUS
  Filled 2022-10-26: qty 50

## 2022-10-26 MED ORDER — POLYETHYLENE GLYCOL 3350 17 G PO PACK: 17.0000 g | PACK | Freq: Every day | ORAL | Status: DC | PRN

## 2022-10-26 MED ORDER — GUAIFENESIN ER 600 MG PO TB12
600.0000 mg | ORAL_TABLET | Freq: Two times a day (BID) | ORAL | Status: DC
  Administered 2022-10-26 – 2022-10-27 (×2): 600 mg via ORAL
  Filled 2022-10-26 (×5): qty 1

## 2022-10-26 NOTE — Progress Notes (Signed)
[facility] Progress Note Patient Name: Kim Jenkins DOB: [birth year] MRN: [id number]   Date of Service  10/26/2022  HPI/Events of Note  Hypotension - Soft BP = 86/61 with MAP = 70. Albumin = 2.6.   eICU Interventions  Plan: 25% Albumin 25 gm IV X 1.     Intervention Category Major Interventions: Hypotension - evaluation and management  Bartt Gonzaga Cornelia Copa 10/26/2022, 4:35 AM

## 2022-10-26 NOTE — ED Notes (Signed)
ED TO INPATIENT HANDOFF REPORT  ED Nurse Name and Phone #: [phone]  S Name/Age/Gender Kim Jenkins 57 y.o. female Room/Bed: 016C/016C  Code Status   Code Status: Full Code  Home/SNF/Other [facility] Patient oriented to: self, place, time, and situation Is this baseline? Yes   Triage Complete: Triage complete  Chief Complaint Acute on chronic respiratory failure ([place]) [J96.20]  Triage Note Pt bib [facility] from [facility] with complaints of chest pain that started 2 hours prior to arrival. Pt states pain is centralized but also has left shoulder pain. Pt arrives on trach collar and is on vent at night.  Pt was given '50mg'$  Hydralazine prior to EMS arrival for a bp of 173/101.    Allergies Allergies  Allergen Reactions   Meperidine And Related Shortness Of Breath and Other (See Comments)    "Allergic," per MAR   Cefazolin Other (See Comments)    "Allergic," per MAR    Level of Care/Admitting Diagnosis ED Disposition     ED Disposition  Admit   Condition  --   [place]: [place] [[id number]]  Level of Care: ICU [6]  May admit patient to [facility] or [facility] if equivalent level of care is available:: Yes  Covid Evaluation: Confirmed COVID Negative  Diagnosis: Acute on chronic respiratory failure [facility]) [[id number]]  Admitting Physician: Freddi Starr [[id number]]  Attending Physician: Freddi Starr [[id number]]  Certification:: I certify this patient will need inpatient services for at least 2 midnights  Estimated Length of Stay: 2          B Medical/Surgery History Past Medical History:  Diagnosis Date   Bipolar 1 disorder ([place])    Chronic pain    Depression    Diabetes mellitus ([place])    Hypertension    Kidney stones    Morbid obesity ([place])    Obesity hypoventilation syndrome ([place])    Obstructive sleep apnea    Panniculitis    Past Surgical History:  Procedure Laterality Date   ESOPHAGOGASTRODUODENOSCOPY N/A 05/06/2018    Procedure: ESOPHAGOGASTRODUODENOSCOPY (EGD);  Surgeon: Irving Copas., MD;  Location: [place];  Service: Gastroenterology;  Laterality: N/A;   RIGHT OOPHORECTOMY     TRACHEOSTOMY     VENTRAL HERNIA REPAIR       A IV Location/Drains/Wounds Patient Lines/Drains/Airways Status     Active Line/Drains/Airways     Name Placement date Placement time Site Days   PICC Single Lumen 36/62/94 Right Basilic 76/54/65  0354  Basilic  47   External Urinary Catheter 09/10/22  1400  --  46   Tracheostomy [place] XLT Proximal 6 mm Cuffed 09/09/22  --  6 mm  47   Pressure Injury 09/09/22 Buttocks Right Stage 3 -  Full thickness tissue loss. Subcutaneous fat may be visible but bone, tendon or muscle are NOT exposed. Right buttocks/thigh yellow, red, pink with flaking skin 09/09/22  2000  -- 47   Pressure Injury 09/09/22 Buttocks Left Deep Tissue Pressure Injury - Purple or maroon localized area of discolored intact skin or blood-filled blister due to damage of underlying soft tissue from pressure and/or shear. L buttocks/thigh purple/red bruisin 09/09/22  2000  -- 47   Wound / Incision (Open or Dehisced) 09/09/22 Irritant Dermatitis (Moisture Associated Skin Damage) Abdomen Lower;Medial pannus folds, chest/breast folds, and armpit pink/red MASD 09/09/22  2000  Abdomen  47   Wound / Incision (Open or Dehisced) 09/09/22 Irritant Dermatitis (Moisture Associated Skin Damage) Knee Left;Posterior posterior knee  pink/red MASD 09/09/22  2000  Knee  47   Wound / Incision (Open or Dehisced) 09/09/22 Thigh Left;Posterior thigh fold pink/red MASD 09/09/22  2000  Thigh  47   Wound / Incision (Open or Dehisced) 09/09/22 Irritant Dermatitis (Moisture Associated Skin Damage) Thigh Posterior;Right posterior thigh pink/red MASD 09/09/22  2000  Thigh  47   Wound / Incision (Open or Dehisced) 09/09/22 Irritant Dermatitis (Moisture Associated Skin Damage) Perineum perineal pink/red MASD with open bleeding areas 09/09/22   2000  Perineum  47            Intake/Output Last 24 hours No intake or output data in the 24 hours ending 10/26/22 0126  Labs/Imaging Results for orders placed or performed during the hospital encounter of 10/25/22 (from the past 48 hour(s))  CBC with Differential     Status: Abnormal   Collection Time: 10/25/22 10:06 PM  Result Value Ref Range   WBC 5.9 4.0 - 10.5 K/uL   RBC 4.12 3.87 - 5.11 MIL/uL   Hemoglobin 11.3 (L) 12.0 - 15.0 g/dL   HCT 37.4 36.0 - 46.0 %   MCV 90.8 80.0 - 100.0 fL   MCH 27.4 26.0 - 34.0 pg   MCHC 30.2 30.0 - 36.0 g/dL   RDW 14.7 11.5 - 15.5 %   Platelets 147 (L) 150 - 400 K/uL   nRBC 0.0 0.0 - 0.2 %   Neutrophils Relative % 61 %   Neutro Abs 3.5 1.7 - 7.7 K/uL   Lymphocytes Relative 28 %   Lymphs Abs 1.7 0.7 - 4.0 K/uL   Monocytes Relative 10 %   Monocytes Absolute 0.6 0.1 - 1.0 K/uL   Eosinophils Relative 1 %   Eosinophils Absolute 0.1 0.0 - 0.5 K/uL   Basophils Relative 0 %   Basophils Absolute 0.0 0.0 - 0.1 K/uL   Immature Granulocytes 0 %   Abs Immature Granulocytes 0.02 0.00 - 0.07 K/uL    Comment: Performed at [facility], [address], [place], [place] [address]  Comprehensive metabolic panel     Status: Abnormal   Collection Time: 10/25/22 10:06 PM  Result Value Ref Range   Sodium 136 135 - 145 mmol/L   Potassium 4.1 3.5 - 5.1 mmol/L   Chloride 96 (L) 98 - 111 mmol/L   CO2 31 22 - 32 mmol/L   Glucose, Bld 161 (H) 70 - 99 mg/dL    Comment: Glucose reference range applies only to samples taken after fasting for at least 8 hours.   BUN 12 6 - 20 mg/dL   Creatinine, Ser 0.67 0.44 - 1.00 mg/dL   Calcium 8.9 8.9 - 10.3 mg/dL   Total Protein 7.1 6.5 - 8.1 g/dL   Albumin 2.6 (L) 3.5 - 5.0 g/dL   AST 14 (L) 15 - 41 U/L   ALT 10 0 - 44 U/L   Alkaline Phosphatase 43 38 - 126 U/L   Total Bilirubin 0.1 (L) 0.3 - 1.2 mg/dL   GFR, Estimated >60 >60 mL/min    Comment: (NOTE) Calculated using the CKD-EPI Creatinine Equation (2021)     Anion gap 9 5 - 15    Comment: Performed at [facility], [place] [address] [place], [place] [address]  Troponin I (High Sensitivity)     Status: None   Collection Time: 10/25/22 10:06 PM  Result Value Ref Range   Troponin I (High Sensitivity) 11 <18 ng/L    Comment: (NOTE) Elevated high sensitivity troponin I (  hsTnI) values and significant  changes across serial measurements may suggest ACS but many other  chronic and acute conditions are known to elevate hsTnI results.  Refer to the "Links" section for chest pain algorithms and additional  guidance. Performed at [facility], [place] [address], [place], [place] [address]   Lactic acid, plasma     Status: None   Collection Time: 10/25/22 10:06 PM  Result Value Ref Range   Lactic Acid, Venous 1.2 0.5 - 1.9 mmol/L    Comment: Performed at [facility] [address], [place], [place] [address]  Valproic acid level     Status: None   Collection Time: 10/25/22 10:06 PM  Result Value Ref Range   Valproic Acid Lvl 87 50.0 - 100.0 ug/mL    Comment: Performed at [facility] [address] [place], [place] [address]  Brain natriuretic peptide     Status: Abnormal   Collection Time: 10/25/22 10:06 PM  Result Value Ref Range   B Natriuretic Peptide 285.7 (H) 0.0 - 100.0 pg/mL    Comment: Performed at [facility] [address], [place], [place] [address]  Resp panel by RT-PCR (RSV, Flu A&B, Covid) Anterior Nasal Swab     Status: None   Collection Time: 10/25/22 10:21 PM   Specimen: Anterior Nasal Swab  Result Value Ref Range   SARS Coronavirus 2 by RT PCR NEGATIVE NEGATIVE   Influenza A by PCR NEGATIVE NEGATIVE   Influenza B by PCR NEGATIVE NEGATIVE    Comment: (NOTE) The Xpert Xpress SARS-CoV-2/FLU/RSV plus assay is intended as an aid in the diagnosis of influenza from Nasopharyngeal swab specimens and should not be used as a sole basis for treatment. Nasal washings and aspirates are unacceptable for  Xpert Xpress SARS-CoV-2/FLU/RSV testing.  Fact Sheet for Patients: EntrepreneurPulse.com.au  Fact Sheet for Healthcare Providers: IncredibleEmployment.be  This test is not yet approved or cleared by the [place] FDA and has been authorized for detection and/or diagnosis of SARS-CoV-2 by FDA under an Emergency Use Authorization (EUA). This EUA will remain in effect (meaning this test can be used) for the duration of the COVID-19 declaration under Section 564(b)(1) of the Act, 21 U.S.C. section 360bbb-3(b)(1), unless the authorization is terminated or revoked.     Resp Syncytial Virus by PCR NEGATIVE NEGATIVE    Comment: (NOTE) Fact Sheet for Patients: EntrepreneurPulse.com.au  Fact Sheet for Healthcare Providers: IncredibleEmployment.be  This test is not yet approved or cleared by the [place] FDA and has been authorized for detection and/or diagnosis of SARS-CoV-2 by FDA under an Emergency Use Authorization (EUA). This EUA will remain in effect (meaning this test can be used) for the duration of the COVID-19 declaration under Section 564(b)(1) of the Act, 21 U.S.C. section 360bbb-3(b)(1), unless the authorization is terminated or revoked.  Performed at [facility], [place] [address] [place],  [address]   I-Stat arterial blood gas, ED [facility], [facility], [facility])     Status: Abnormal   Collection Time: 10/25/22 11:55 PM  Result Value Ref Range   pH, Arterial 7.345 (L) 7.35 - 7.45   pCO2 arterial 69.4 (HH) 32 - 48 mmHg   pO2, Arterial 60 (L) 83 - 108 mmHg   Bicarbonate 37.9 (H) 20.0 - 28.0 mmol/L   TCO2 40 (H) 22 - 32 mmol/L   O2 Saturation 88 %   Acid-Base Excess 10.0 (H) 0.0 - 2.0 mmol/L   Sodium 137 135 -  145 mmol/L   Potassium 4.3 3.5 - 5.1 mmol/L   Calcium, Ion 1.32 1.15 - 1.40 mmol/L   HCT 33.0 (L) 36.0 - 46.0 %   Hemoglobin 11.2 (L) 12.0 - 15.0 g/dL   Patient temperature 37.0 C     Collection site RADIAL, ALLEN'S TEST ACCEPTABLE    Drawn by RT    Sample type ARTERIAL    Comment NOTIFIED PHYSICIAN   Lactic acid, plasma     Status: None   Collection Time: 10/26/22 12:27 AM  Result Value Ref Range   Lactic Acid, Venous 0.9 0.5 - 1.9 mmol/L    Comment: Performed at [facility], [place] [address], [place], [place] [address]  Troponin I (High Sensitivity)     Status: None   Collection Time: 10/26/22 12:27 AM  Result Value Ref Range   Troponin I (High Sensitivity) 16 <18 ng/L    Comment: (NOTE) Elevated high sensitivity troponin I (hsTnI) values and significant  changes across serial measurements may suggest ACS but many other  chronic and acute conditions are known to elevate hsTnI results.  Refer to the "Links" section for chest pain algorithms and additional  guidance. Performed at [facility], [place] [address], [place], [place] [address]    DG Chest Port 1 View  Result Date: 10/25/2022 CLINICAL DATA:  Cough and chest pain.  Tracheostomy. EXAM: PORTABLE CHEST 1 VIEW COMPARISON:  09/09/2022 FINDINGS: Patient rotation limits examination. Tracheostomy tube appears in place. Heart size and pulmonary vascularity are normal for technique. Dense triangular opacity in the left medial chest suggesting collapse or consolidation, new since prior study. Right lung is clear. No pleural effusions. No pneumothorax. A right PICC line appears in place without change since prior study. IMPRESSION: Triangular shaped opacity in the left lower lung medially suggesting consolidation or atelectasis. Electronically Signed   By: Lucienne Capers M.D.   On: 10/25/2022 22:37    Pending Labs Unresulted Labs (From admission, onward)     Start     Ordered   10/26/22 0500  CBC  Tomorrow morning,   R        10/26/22 0121   10/26/22 3716  Basic metabolic panel  Tomorrow morning,   R        10/26/22 0121   10/26/22 0500  Magnesium  Tomorrow morning,   R        10/26/22 0121    10/26/22 0500  Phosphorus  Tomorrow morning,   R        10/26/22 0121   10/26/22 0125  Hemoglobin A1c  Once,   R       Comments: To assess prior glycemic control    10/26/22 0124   10/26/22 0123  MRSA Next Gen by PCR, Nasal  Once,   R        10/26/22 0122   10/26/22 0121  Strep pneumoniae urinary antigen (not at [facility])  Once,   R        10/26/22 0121   10/26/22 0121  Legionella Pneumophila Serogp 1 Ur Ag  Once,   R        10/26/22 0121   10/26/22 0119  CBC  (heparin)  Once,   R       Comments: Baseline for heparin therapy IF NOT ALREADY DRAWN.  Notify MD if PLT < 100 K.    10/26/22 0121   10/26/22 0119  Creatinine, serum  (heparin)  Once,   R  Comments: Baseline for heparin therapy IF NOT ALREADY DRAWN.    10/26/22 0121   10/26/22 0005  Blood culture (routine x 2)  BLOOD CULTURE X 2,   R      10/26/22 0004            Vitals/Pain Today's Vitals   10/25/22 2204 10/25/22 2226 10/25/22 2245 10/26/22 0005  BP: (!) 139/98  126/74   Pulse:  90 85 86  Resp:  14 (!) 21 18  Temp:      TempSrc:      SpO2:  95% 92% 96%  PainSc:        Isolation Precautions No active isolations  Medications Medications  vancomycin (VANCOREADY) IVPB 2000 mg/400 mL (2,000 mg Intravenous New Bag/Given 10/26/22 0103)  docusate sodium (COLACE) capsule 100 mg (has no administration in time range)  polyethylene glycol (MIRALAX / GLYCOLAX) packet 17 g (has no administration in time range)  heparin injection 5,000 Units (has no administration in time range)  pantoprazole (PROTONIX) injection 40 mg (has no administration in time range)  ondansetron (ZOFRAN) injection 4 mg (has no administration in time range)  insulin aspart (novoLOG) injection 0-15 Units (has no administration in time range)  ceFEPIme (MAXIPIME) 2 g in sodium chloride 0.9 % 100 mL IVPB (0 g Intravenous Stopped 10/26/22 0103)    Mobility non-ambulatory     Focused Assessments Cardiac Assessment Handoff:    Lab Results   Component Value Date   TROPONINI 0.04 (HH) 11/09/2018   Lab Results  Component Value Date   DDIMER 1.09 (H) 09/09/2022   Does the Patient currently have chest pain? Yes    R Recommendations: See Admitting Provider Note  Report given to:   Additional Notes:

## 2022-10-26 NOTE — Progress Notes (Signed)
Pharmacy Antibiotic Note  Arcelia Pals is a 57 y.o. female admitted from [facility] on 10/25/2022 with pneumonia.  Pharmacy has been consulted for Vancomycin and Cefepime dosing.  Vancomycin 2 g IV given in ED at 0100    Plan: Vancomycin 1250 mg IV q12h Cefepime 2 g IV q8h     Temp (24hrs), Avg:98.6 F (37 C), Min:98.6 F (37 C), Max:98.6 F (37 C)  Recent Labs  Lab 10/25/22 2206 10/26/22 0027  WBC 5.9  --   CREATININE 0.67  --   LATICACIDVEN 1.2 0.9    CrCl cannot be calculated (Unknown ideal weight.).    Allergies  Allergen Reactions   Meperidine And Related Shortness Of Breath and Other (See Comments)    "Allergic," per MAR   Cefazolin Other (See Comments)    "Allergic," per [facility]     Quinnley Colasurdo, Bronson Curb 10/26/2022 1:28 AM

## 2022-10-26 NOTE — H&P (Signed)
NAME:  Kim Jenkins, MRN:  [id number], DOB:  31-Dec-1965, LOS: 0 ADMISSION DATE:  10/25/2022, CONSULTATION DATE:  10/26/22 REFERRING MD:  Quincy Carnes, PA CHIEF COMPLAINT:  Resp Failure   History of Present Illness:  Kim Jenkins is a 57 year old woman with history of obesity, chronic trach, bipolar disorder, DMII, and OSA/OHS who was brought to the ER from [facility] for complaints of chest pain. Her chest pain started today. Her BP was 173/101 en route and was given '50mg'$  hydrlazine by EMS.   In ER, initial vitals were RR 21, HR 85, BP 126/74, SpO2 92% on 10L trach collar. ABG pH 7.345, pCO2 69.4, pO2 60. EKG sinus rhythm with no acute ST or T wave abnormalities. BNP 285. Trop 11. CXR showed triangular shaped opacity in left lower lung concerning for atelectasis vs consolidation. She was placed on vent support with peep of 8cmH2O. She was started on vancomycin and cefepime for concern of pneumonia.  PCCM called to admit the patient.  She reports increased tracheostomy secretions recently. Denies fevers and chills. She does have nausea without vomiting. Her chest pain has resolved at time of my interview.   Pertinent  Medical History    Bipolar 1 disorder (HCC)     Chronic pain     Depression     Diabetes mellitus ([place])     Hypertension     Kidney stones     Morbid obesity ([place])     Obesity hypoventilation syndrome ([place])     Obstructive sleep apnea     Panniculitis    Significant Hospital Events: Including procedures, antibiotic start and stop dates in addition to other pertinent events   2/8 admitted for chest pain  Interim History / Subjective:  As above  Objective   Blood pressure 126/74, pulse 86, temperature 98.6 F (37 C), temperature source Oral, resp. rate 18, SpO2 96 %.    Vent Mode: PRVC FiO2 (%):  [40 %] 40 % Set Rate:  [18 bmp] 18 bmp Vt Set:  [500 mL] 500 mL PEEP:  [8 cmH20] 8 cmH20 Plateau Pressure:  [18 cmH20] 18 cmH20  No intake or output data in the 24 hours ending  10/26/22 0051 There were no vitals filed for this visit.  Examination: General: obese, no acute distress, on vent HENT: [name], trach in place, no secretions noted Lungs: diminished breath sounds. No wheezing noted. Cardiovascular: rrr, no murmurs Abdomen: non-tender, obese, BS+ Extremities: warm, no edema Neuro: awakes to verbal stimuli, follows commands and answers questions GU: n/a  Resolved Hospital Problem list     Assessment & Plan:  Acute on Chronic Hypoxemic and Hypercapnic Respiratory Failure Pneumonia vs Atelectasis Chronic Tracheostomy OHS/OSA - continue vent support over night - transition to trach collar during day when able - Continue cefepime and vancomycin - Follow up cultures, MRSA screen and procalcictonin to descalate abx - Repeat CXR to determine if atelectasis. Low WBC count, afebrile - lower concern for infection at this time.   History of PE - not on anticoagulation   DMII - SSI   Bipolar Disorder - hold home meds for now   Chronic Pain - PRN tylenol - on fentanyl patch at home  Nausea - PRN zofran  Best Practice (right click and "Reselect all SmartList Selections" daily)   Diet/type: NPO w/ oral meds DVT prophylaxis: prophylactic heparin  GI prophylaxis: PPI Lines: N/A Foley:  N/A Code Status:  full code Last date of multidisciplinary goals of care discussion [n/a]  Labs   CBC: Recent Labs  Lab 10/25/22 2206 10/25/22 2355  WBC 5.9  --   NEUTROABS 3.5  --   HGB 11.3* 11.2*  HCT 37.4 33.0*  MCV 90.8  --   PLT 147*  --     Basic Metabolic Panel: Recent Labs  Lab 10/25/22 2206 10/25/22 2355  NA 136 137  K 4.1 4.3  CL 96*  --   CO2 31  --   GLUCOSE 161*  --   BUN 12  --   CREATININE 0.67  --   CALCIUM 8.9  --    GFR: CrCl cannot be calculated (Unknown ideal weight.). Recent Labs  Lab 10/25/22 2206  WBC 5.9  LATICACIDVEN 1.2    Liver Function Tests: Recent Labs  Lab 10/25/22 2206  AST 14*  ALT 10  ALKPHOS  43  BILITOT 0.1*  PROT 7.1  ALBUMIN 2.6*   No results for input(s): "LIPASE", "AMYLASE" in the last 168 hours. No results for input(s): "AMMONIA" in the last 168 hours.  ABG    Component Value Date/Time   PHART 7.345 (L) 10/25/2022 2355   PCO2ART 69.4 (HH) 10/25/2022 2355   PO2ART 60 (L) 10/25/2022 2355   HCO3 37.9 (H) 10/25/2022 2355   TCO2 40 (H) 10/25/2022 2355   O2SAT 88 10/25/2022 2355     Coagulation Profile: No results for input(s): "INR", "PROTIME" in the last 168 hours.  Cardiac Enzymes: No results for input(s): "CKTOTAL", "CKMB", "CKMBINDEX", "TROPONINI" in the last 168 hours.  HbA1C: Hgb A1c MFr Bld  Date/Time Value Ref Range Status  12/05/2019 06:15 AM 5.2 4.8 - 5.6 % Final    Comment:    (NOTE) Pre diabetes:          5.7%-6.4% Diabetes:              >6.4% Glycemic control for   <7.0% adults with diabetes     CBG: No results for input(s): "GLUCAP" in the last 168 hours.  Review of Systems:   As above in HPI  Past Medical History:  She,  has a past medical history of Bipolar 1 disorder ([place]), Chronic pain, Depression, Diabetes mellitus ([place]), Hypertension, Kidney stones, Morbid obesity ([place]), Obesity hypoventilation syndrome ([place]), Obstructive sleep apnea, and Panniculitis.   Surgical History:   Past Surgical History:  Procedure Laterality Date   ESOPHAGOGASTRODUODENOSCOPY N/A 05/06/2018   Procedure: ESOPHAGOGASTRODUODENOSCOPY (EGD);  Surgeon: Irving Copas., MD;  Location: [place];  Service: Gastroenterology;  Laterality: N/A;   RIGHT OOPHORECTOMY     TRACHEOSTOMY     VENTRAL HERNIA REPAIR       Social History:   reports that she has never smoked. She has never used smokeless tobacco. She reports that she does not drink alcohol and does not use drugs.   Family History:  Her family history is not on file.   Allergies Allergies  Allergen Reactions   Meperidine And Related Shortness Of Breath and Other (See Comments)     "Allergic," per MAR   Cefazolin Other (See Comments)    "Allergic," per [facility]     Home Medications  Prior to Admission medications   Medication Sig Start Date End Date Taking? Authorizing Provider  divalproex (DEPAKOTE ER) 500 MG 24 hr tablet Take 2 tablets (1,000 mg total) by mouth every 12 (twelve) hours. 09/13/18   Ward, Ozella Almond, PA-C  erythromycin ophthalmic ointment Place 1 Application into both eyes 4 (four) times daily.    [provider]  famotidine (PEPCID) 40 MG tablet Take 40 mg by mouth 2 (two) times daily.    [provider]  fentaNYL (DURAGESIC) 25 MCG/HR Place 1 patch onto the skin every 3 (three) days. 08/03/22   [provider]  FLUoxetine (PROZAC) 20 MG capsule Take 20 mg by mouth daily. 08/25/22   [provider]  fluticasone (FLONASE) 50 MCG/ACT nasal spray Place 2 sprays into both nostrils daily.     [provider]  gabapentin (NEURONTIN) 300 MG capsule Take 300 mg by mouth 2 (two) times daily. 08/11/22   [provider]  hydrALAZINE (APRESOLINE) 50 MG tablet Take 50 mg by mouth every 6 (six) hours as needed (SBP > 160 and DPB > 100).    [provider]  loperamide (IMODIUM A-D) 2 MG tablet Take 2 mg by mouth every 6 (six) hours as needed for diarrhea or loose stools.     [provider]  moxifloxacin (VIGAMOX) 0.5 % ophthalmic solution Place 1 drop into both eyes in the morning, at noon, in the evening, and at bedtime.    [provider]  Multiple Vitamin (MULTIVITAMIN WITH MINERALS) TABS tablet Take 1 tablet by mouth daily.    [provider]  mupirocin ointment (BACTROBAN) 2 % Place 1 Application into the nose 2 (two) times daily. 09/12/22   Candee Furbish, MD  nitroGLYCERIN (NITROSTAT) 0.4 MG SL tablet Place 0.4 mg under the tongue every 5 (five) minutes x 3 doses as needed for chest pain (AND CALL PROVIDER).     [provider]  olopatadine (PATANOL) 0.1 % ophthalmic  solution Place 1 drop into both eyes 2 (two) times daily.    [provider]  omeprazole (PRILOSEC) 40 MG capsule Take 40 mg by mouth daily. 08/12/22   [provider]  ondansetron (ZOFRAN-ODT) 4 MG disintegrating tablet Take 4 mg by mouth every 6 (six) hours as needed for nausea or vomiting (DISSOLVE ORALLY).    [provider]  polyethylene glycol powder (GLYCOLAX/MIRALAX) 17 GM/SCOOP powder Take 1 Container by mouth every 12 (twelve) hours as needed for mild constipation.    [provider]  Propylene Glycol (SYSTANE BALANCE) 0.6 % SOLN Place 1 drop into both eyes every 6 (six) hours.     [provider]  sodium chloride (OCEAN) 0.65 % SOLN nasal spray Place 2 sprays into both nostrils 2 (two) times daily as needed for congestion.    [provider]  venlafaxine XR (EFFEXOR-XR) 75 MG 24 hr capsule Take 75 mg by mouth daily with breakfast.    [provider]  VRAYLAR 4.5 MG CAPS Take 4.5 mg by mouth daily. 08/16/22   [provider]  Zinc Oxide (TRIPLE PASTE) 12.8 % ointment Apply topically 2 (two) times daily. 09/12/22   Candee Furbish, MD     Critical care time: 40 minutes    Freda Jackson, MD [facility] Office: [phone]   See Amion for personal pager PCCM on call pager [phone] until 7pm. Please call Elink 7p-7a. [phone]

## 2022-10-26 NOTE — TOC Progression Note (Signed)
Transition of Care [facility]) - Progression Note    Patient Details  Name: Kim Jenkins MRN: [id number] Date of Birth: [birth year]  Transition of Care [facility]) CM/SW [place], RN Phone Number: 10/26/2022, 11:26 AM  Clinical Narrative:     Patient from [facility] called Burgess Estelle who is relaying information of possible DC tomorrow to Levada Dy [phone]       Expected Discharge Plan and Services                                               Social Determinants of Health (SDOH) Interventions SDOH Screenings   Tobacco Use: Low Risk  (10/25/2022)    Readmission Risk Interventions     No data to display

## 2022-10-26 NOTE — Progress Notes (Signed)
PCCM Update Note  S:  Resting on the vent this morning. Does say she's had more secretions recently. Says she's on the vent at night and with naps at [facility].   O:  On levo 2 mcg/min, minimal vent settings  General appearance: 57 y.o., female, chronically ill appearing Eyes: PERRL, tracking appropriately HENT: NCAT; MMM, edentulous Neck: 6 prox shiley in place Lungs:mech breath sound sbl, with normal respiratory effort CV: RRR, no murmur  Abdomen: Soft, non-tender; non-distended, BS present  Extremities: trace peripheral edema, warm Neuro: Alert and oriented to person and place, no focal deficit   Labs/imaging reviewed: Bicarb 33-34 Procal 0.11 WBC 6  CXR with ?lll atelectasis  A/P: Chronic hypoxic hypercapnic respiratory failure sp tracheostomy OHS/OSA Increased secretions, LLL atelectasis Hypotension  - ramp up pulm toilet - stop ABX - 1 L bolus  - wean levo for map 65 - PT/mobilize  Additional critical care time 30 minutes  [place]

## 2022-10-26 NOTE — Evaluation (Addendum)
Clinical/Bedside Swallow Evaluation Patient Details  Name: Kim Jenkins MRN: [id number] Date of Birth: 1965-10-03  Today's Date: 10/26/2022 Time: SLP Start Time (ACUTE ONLY): 1045 SLP Stop Time (ACUTE ONLY): 1052 SLP Time Calculation (min) (ACUTE ONLY): 7 min  Past Medical History:  Past Medical History:  Diagnosis Date   Bipolar 1 disorder ([place])    Chronic pain    Depression    Diabetes mellitus ([place])    Hypertension    Kidney stones    Morbid obesity ([place])    Obesity hypoventilation syndrome ([place])    Obstructive sleep apnea    Panniculitis    Past Surgical History:  Past Surgical History:  Procedure Laterality Date   ESOPHAGOGASTRODUODENOSCOPY N/A 05/06/2018   Procedure: ESOPHAGOGASTRODUODENOSCOPY (EGD);  Surgeon: Irving Copas., MD;  Location: [facility];  Service: Gastroenterology;  Laterality: N/A;   RIGHT OOPHORECTOMY     TRACHEOSTOMY     VENTRAL HERNIA REPAIR     HPI:  Kim Jenkins is a 57 year old woman who was brought to the ER from [facility] for complaints of chest pain. CXR 2/7: "Triangular shaped opacity in the left lower lung medially suggesting consolidation or atelectasis." And started on vancomycin and cefepime for concern of pneumonia. Pt with history of obesity, chronic trach on vent overnight, bipolar disorder, DMII, and OSA/OHS.    Assessment / Plan / Recommendation  Clinical Impression  Pt presents with functional swallowing as assessed clinically.  She was seen with PMSV in place for all PO trials.  Pt tolerated all consistencies trialed with no clinical s/s of aspiration and exhibited good oral clearance of solid and puree textures.  Pt reports consuming regular texture diet at baseline.  This is her 4th pneumonia in the past year she reports (most recently seen by this service 09/12/22 with clinical recs for mech soft/thin), but she also states she has passed multiple swallow evaluations with [facility].  Pt reports past evaluation by [facility].  If objective  pharyngeal swallow study is indicated, would recommend FEES over MBSS to allow different view of swallow and 2/2 body habitus.  Reached out to MD to determine if additional testing is needed or if current chest imaging can be attributed to causes other than aspiration pneumonia.    Recommend resuming regular texture diet with thin liquid with PMSV in place.  SLP Visit Diagnosis: Dysphagia, unspecified (R13.10)    Aspiration Risk  Mild aspiration risk    Diet Recommendation Regular;Thin liquid   Liquid Administration via: Cup;Straw Medication Administration: Whole meds with liquid Supervision: Patient able to self feed Compensations: Slow rate;Small sips/bites (Wear PMSV) Postural Changes: Seated upright at 90 degrees    Other  Recommendations Oral Care Recommendations: Oral care BID;Staff/trained caregiver to provide oral care    Recommendations for follow up therapy are one component of a multi-disciplinary discharge planning process, led by the attending physician.  Recommendations may be updated based on patient status, additional functional criteria and insurance authorization.  Follow up Recommendations SLP at Long-term acute care hospital      Assistance Recommended at Discharge  N/A  Functional Status Assessment Patient has not had a recent decline in their functional status  Frequency and Duration  (N/A)          Prognosis Prognosis for improved oropharyngeal function:  (N/A)      Swallow Study   General Date of Onset: 10/24/22 HPI: Kim Jenkins is a 57 year old woman who was brought to the ER from [facility] for complaints of  chest pain. CXR 2/7: "Triangular shaped opacity in the left lower lung medially suggesting consolidation or atelectasis." And started on vancomycin and cefepime for concern of pneumonia. Pt with history of obesity, chronic trach on vent overnight, bipolar disorder, DMII, and OSA/OHS. Type of Study: Bedside Swallow Evaluation Previous Swallow  Assessment: Most recent CSE this facility 09/12/22 Diet Prior to this Study: NPO Respiratory Status: Trach Collar Trach Size and Type: Deflated;Cuff;With PMSV in place;#6 Behavior/Cognition: Alert;Cooperative;Pleasant mood Oral Cavity Assessment: Within Functional Limits Oral Cavity - Dentition: Missing dentition;Edentulous (upper arch) Vision: Functional for self-feeding Self-Feeding Abilities: Able to feed self Patient Positioning: Upright in bed Baseline Vocal Quality: Normal Volitional Cough: Strong Volitional Swallow: Able to elicit    Oral/Motor/Sensory Function Overall Oral Motor/Sensory Function: Within functional limits Facial ROM: Within Functional Limits Facial Symmetry: Within Functional Limits Lingual ROM: Within Functional Limits Lingual Symmetry: Within Functional Limits Lingual Strength: Within Functional Limits Velum: Within Functional Limits Mandible: Within Functional Limits   Ice Chips Ice chips: Within functional limits   Thin Liquid Thin Liquid: Within functional limits Presentation: Straw    Nectar Thick Nectar Thick Liquid: Not tested   Honey Thick Honey Thick Liquid: Not tested   Puree Puree: Within functional limits Presentation: Spoon   Solid     Solid: Within functional limits Presentation: [place], [place], [place] Office: [phone] 10/26/2022,11:45 AM

## 2022-10-26 NOTE — Progress Notes (Signed)
[facility] Progress Note Patient Name: Kim Jenkins DOB: 11-25-1965 MRN: [id number]   Date of Service  10/26/2022  HPI/Events of Note  Hypertension - BP = 158/87.  eICU Interventions  Plan: Hydralazine 10 mg IV Q 4 hours PRN SBP > 160 or DBP > 100.      Intervention Category Major Interventions: Other:  Lysle Dingwall 10/26/2022, 7:56 PM

## 2022-10-26 NOTE — Progress Notes (Signed)
[facility] Progress Note Patient Name: Kim Jenkins DOB: 12-27-1965 MRN: [id number]   Date of Service  10/26/2022  HPI/Events of Note  Hypotension - BP = 73/46 with MAP = 56. No improvement post 25% Albumin bolus. Patient has PICC line.   eICU Interventions  Plan: Norepinephrine IV infusion. Titrate to MAO > 65.      Intervention Category Major Interventions: Hypotension - evaluation and management  [name] 10/26/2022, 6:00 AM

## 2022-10-26 NOTE — Evaluation (Addendum)
[facility] Speaking Valve - Evaluation Patient Details  Name: Kim Jenkins MRN: [id number] Date of Birth: October 17, 1965  Today's Date: 10/26/2022 Time: 8185-6314 SLP Time Calculation (min) (ACUTE ONLY): 7 min  Past Medical History:  Past Medical History:  Diagnosis Date   Bipolar 1 disorder ([place])    Chronic pain    Depression    Diabetes mellitus ([place])    Hypertension    Kidney stones    Morbid obesity ([place])    Obesity hypoventilation syndrome ([place])    Obstructive sleep apnea    Panniculitis    Past Surgical History:  Past Surgical History:  Procedure Laterality Date   ESOPHAGOGASTRODUODENOSCOPY N/A 05/06/2018   Procedure: ESOPHAGOGASTRODUODENOSCOPY (EGD);  Surgeon: Irving Copas., MD;  Location: [facility];  Service: Gastroenterology;  Laterality: N/A;   RIGHT OOPHORECTOMY     TRACHEOSTOMY     VENTRAL HERNIA REPAIR     HPI:  Kim Jenkins is a 57 year old woman who was brought to the ER from [facility] for complaints of chest pain. CXR 2/7: "Triangular shaped opacity in the left lower lung medially suggesting consolidation or atelectasis." And started on vancomycin and cefepime for concern of pneumonia. Pt with history of obesity, chronic trach on vent overnight, bipolar disorder, DMII, and OSA/OHS.    Assessment / Plan / Recommendation  Clinical Impression  Pt with chronic tracheostomy (6 cuffed) and ventilator dependence overnight.  She is independent with valve use as baseline, but her personal valve was not transported with her from [facility].  She can verbalize reasons to remove PMSV and reports she can don/doff independently, though at times she has difficulty placing it at trach hub.  Pt tolerated valve placement today with no change in vital signs and no discomfort.  Pt reports relief at being able to talk again.  Pt wore valve throughout PO trials as part of swallowing assessment completed today as well.  Pt does not needs SLP follow up for PMSV usage.   Recommend pt wear  valve during all waking hours as tolerated, with cuff deflated, and with all PO intake.  SLP Visit Diagnosis: Aphonia (R49.1)    SLP Assessment  Patient does not need any further Speech [facility] Services    Recommendations for follow up therapy are one component of a multi-disciplinary discharge planning process, led by the attending physician.  Recommendations may be updated based on patient status, additional functional criteria and insurance authorization.  Follow Up Recommendations  SLP at Long-term acute care hospital    Assistance Recommended at Discharge Frequent or constant Supervision/Assistance  Functional Status Assessment Patient has not had a recent decline in their functional status  Frequency and Duration  (N/A)       PMSV Trial PMSV was placed for: 14 Able to redirect subglottic air through upper airway: Yes Able to Attain Phonation: Yes Voice Quality: Normal Able to Expectorate Secretions: No attempts Level of Secretion Expectoration with PMSV: Not observed Breath Support for Phonation: Adequate Intelligibility: Intelligible Respirations During Trial: 10 SpO2 During Trial: 96 % Behavior: Alert;Controlled;Cooperative;Expresses self well;Good eye contact;Responsive to questions   Tracheostomy Tube   6 cuffed   Vent Dependency  FiO2 (%): 28 %    Cuff Deflation Trial  tolerated         Celedonio Savage, MA, [place] Office: [phone] 10/26/2022, 11:53 AM

## 2022-10-26 NOTE — Progress Notes (Signed)
[facility] ADULT ICU REPLACEMENT PROTOCOL   The patient does apply for the [facility] Adult ICU Electrolyte Replacment Protocol based on the criteria listed below:   1.Exclusion criteria: TCTS, ECMO, Dialysis, and Myasthenia Gravis patients 2. Is GFR >/= 30 ml/min? Yes.    Patient's GFR today is >60 3. Is SCr </= 2? Yes.   Patient's SCr is 0.58 mg/dL 4. Did SCr increase >/= 0.5 in 24 hours? No. 5.Pt's weight >40kg  Yes.   6. Abnormal electrolyte(s): mag 1.8  7. Electrolytes replaced per protocol 8.  Call MD STAT for K+ </= 2.5, Phos </= 1, or Mag </= 1 Physician:  protocol  Darlys Gales 10/26/2022 3:21 AM

## 2022-10-27 ENCOUNTER — Inpatient Hospital Stay (HOSPITAL_COMMUNITY): Payer: Medicare Other

## 2022-10-27 LAB — BASIC METABOLIC PANEL
Anion gap: 8 (ref 5–15)
BUN: 14 mg/dL (ref 6–20)
CO2: 33 mmol/L — ABNORMAL HIGH (ref 22–32)
Calcium: 9 mg/dL (ref 8.9–10.3)
Chloride: 96 mmol/L — ABNORMAL LOW (ref 98–111)
Creatinine, Ser: 0.71 mg/dL (ref 0.44–1.00)
GFR, Estimated: >60 mL/min (ref >60)
Glucose, Bld: 130 mg/dL — ABNORMAL HIGH (ref 70–99)
Potassium: 4.6 mmol/L (ref 3.5–5.1)
Sodium: 137 mmol/L (ref 135–145)

## 2022-10-27 LAB — GLUCOSE, CAPILLARY
Glucose-Capillary: 108 mg/dL — ABNORMAL HIGH (ref 70–99)
Glucose-Capillary: 115 mg/dL — ABNORMAL HIGH (ref 70–99)
Glucose-Capillary: 129 mg/dL — ABNORMAL HIGH (ref 70–99)

## 2022-10-27 MED ORDER — GUAIFENESIN ER 600 MG PO TB12: 600.0000 mg | ORAL_TABLET | Freq: Two times a day (BID) | ORAL | 0 refills | Status: DC

## 2022-10-27 MED ORDER — DIVALPROEX SODIUM ER 500 MG PO TB24
1000.0000 mg | ORAL_TABLET | Freq: Two times a day (BID) | ORAL | Status: DC
  Administered 2022-10-27: 1000 mg via ORAL
  Filled 2022-10-27 (×2): qty 2

## 2022-10-27 MED ORDER — FLUOXETINE HCL 10 MG PO CAPS
20.0000 mg | ORAL_CAPSULE | Freq: Every morning | ORAL | Status: DC
  Administered 2022-10-27: 20 mg via ORAL
  Filled 2022-10-27: qty 2

## 2022-10-27 MED ORDER — GERHARDT'S BUTT CREAM
TOPICAL_CREAM | Freq: Two times a day (BID) | CUTANEOUS | Status: DC
  Filled 2022-10-27 (×2): qty 1

## 2022-10-27 MED ORDER — GABAPENTIN 300 MG PO CAPS
300.0000 mg | ORAL_CAPSULE | Freq: Two times a day (BID) | ORAL | Status: DC
  Administered 2022-10-27: 300 mg via ORAL
  Filled 2022-10-27: qty 1

## 2022-10-27 MED ORDER — ADULT MULTIVITAMIN W/MINERALS CH
1.0000 | ORAL_TABLET | Freq: Every day | ORAL | Status: DC
  Administered 2022-10-27: 1 via ORAL
  Filled 2022-10-27: qty 1

## 2022-10-27 MED ORDER — VENLAFAXINE HCL ER 75 MG PO CP24
75.0000 mg | ORAL_CAPSULE | Freq: Every morning | ORAL | Status: DC
  Administered 2022-10-27: 75 mg via ORAL
  Filled 2022-10-27: qty 1

## 2022-10-27 NOTE — Progress Notes (Signed)
NAME:  Kim Jenkins, MRN:  [id number], DOB:  [birth year], LOS: 1 ADMISSION DATE:  10/25/2022, CONSULTATION DATE:  10/26/22 REFERRING MD:  Quincy Carnes, PA CHIEF COMPLAINT:  Resp Failure   History of Present Illness:  57 year old F with history of obesity, chronic trach, bipolar disorder, DMII, and OSA/OHS who was brought to the ER from [facility] for complaints of chest pain. Her chest pain started today. Her BP was 173/101 en route and was given 48m hydrlazine by EMS.   In ER, initial vitals were RR 21, HR 85, BP 126/74, SpO2 92% on 10L trach collar. ABG pH 7.345, pCO2 69.4, pO2 60. EKG sinus rhythm with no acute ST or T wave abnormalities. BNP 285. Trop 11. CXR showed triangular shaped opacity in left lower lung concerning for atelectasis vs consolidation. She was placed on vent support with peep of 8cmH2O. She was started on vancomycin and cefepime for concern of pneumonia.  PCCM called to admit the patient.  She reports increased tracheostomy secretions recently. Denies fevers and chills. She does have nausea without vomiting. Her chest pain has resolved at time of my interview.   Pertinent  Medical History    Bipolar 1 disorder ([facility])     Chronic pain     Depression     Diabetes mellitus ([place]     Hypertension     Kidney stones     Morbid obesity ([place]     Obesity hypoventilation syndrome ([facility]     Obstructive sleep apnea     Panniculitis    Significant Hospital Events: Including procedures, antibiotic start and stop dates in addition to other pertinent events   2/8 Admitted for chest pain  Interim History / Subjective:  Afebrile  Off pressors  I/O 1.1L UOP, -1L in last 24 hours Vent overnight > now on ATC   Objective   Blood pressure (!) 142/101, pulse 83, temperature 98.5 F (36.9 C), temperature source Oral, resp. rate (!) 22, weight 119.2 kg, SpO2 100 %.    FiO2 (%):  [28 %-40 %] 30 % Set Rate:  [18 bmp] 18 bmp Vt Set:  [500 mL] 500 mL PEEP:  [8 cmH20] 8 cmH20    Intake/Output Summary (Last 24 hours) at 10/27/2022 [id number] data filed at 10/27/2022 0400 Gross per 24 hour  Intake 27.8 ml  Output 1125 ml  Net -1097.2 ml   Filed Weights   10/26/22 0244 10/27/22 0436  Weight: 125 kg 119.2 kg    Examination: General: adult female sitting up in bed in NAD  HEENT: MM pink/moist, trach midline c/d/I, #6 XLT, anicteric, PMV in place Neuro: AAOx4, speech clear, MAE  CV: s1s2 RRR, no m/r/g PULM: non-labored at rest, lungs bilaterally clear GI: soft, bsx4 active  Extremities: warm/dry, trace BLE edema  Skin: no rashes or lesions  CXR 2/9 >> pending  Resolved Hospital Problem list     Assessment & Plan:   Acute on Chronic Hypoxemic and Hypercapnic Respiratory Failure Suspected Atelectasis Chronic Tracheostomy OHS/OSA S/p vanco, cefepime on admit. ABX stopped  -PRVC support overnight  -ATC during day  -assess CXR now, pending review may be able to go back to [facility]  -continue hypertonic saline for now  -monitor off abx  -continue duoneb  -PCT negative   History of PE Not on anticoagulation. 2021, treated.  -monitor for symptoms -troponin flat, EKG without ST changes    DM II -SSI, moderate scale    Bipolar Disorder -resume home meds   Chronic  Pain -PRN tylenol  -continue home fentanyl patch   Nausea -PRN Zofran   Chronic Deconditioning  -worked with PT, at [facility] (right click and "Reselect all SmartList Selections" daily)  Diet/type: NPO w/ oral meds DVT prophylaxis: prophylactic heparin  GI prophylaxis: PPI Lines: N/A Foley:  N/A Code Status:  full code Last date of multidisciplinary goals of care discussion [n/a]  Anticipate transfer back to [facility] 2/9. SW notified.  Pending bed.   Critical care time: n/a   Noe Gens, MSN, APRN, NP-C, AGACNP-BC [facility] 10/27/2022, 8:25 AM   Please see [email or web address] for pager details.   From 7A-7P if no response, please call  [phone] After hours, please call ELink [phone]

## 2022-10-27 NOTE — Consult Note (Signed)
WOC Nurse Consult Note: Reason for Consult: pressure injuries and MASD Patient from LTAC, vent dependent, indwelling FC, mobid obesity  Wound type:  ICD (irritant contact dermatitis) ICD-10 CM Codes for Irritant Dermatitis L30.4  - Erythema intertrigo. Also used for abrasion of the hand, chafing of the skin, dermatitis due to sweating and friction, friction dermatitis, friction eczema, and genital/thigh intertrigo.  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 Pat dry Apply Gerhardt's barrier ointment to the affected areas  Cleanse under pannus removing all powders and creams. Use antimicrobial wicking fabric under pannus, see orders.    Low air loss mattress in place for moisture management and pressure redistribution.  If the patient moves out of the ICU would recommend bariatric air mattress.    Discussed POC with bedside nurse.  Re consult if needed, will not follow at this time. Thanks  Acire Tang [employer], RN,CWOCN, CNS, [place] [phone])

## 2022-10-27 NOTE — Discharge Summary (Signed)
Physician Discharge Summary  Patient ID: Kim Jenkins MRN: [id number] DOB/AGE: 1966/02/23 57 y.o.  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   The patient reported her pain began on day of presentation. She was hypertensive with EMS and was treated with hydralazine.     In ER, initial vitals were RR 21, HR 85, BP 126/74, SpO2 92% on 10L trach collar. ABG pH 7.345, pCO2 69.4, pO2 60. EKG sinus rhythm with no acute ST or T wave abnormalities. BNP 285. Trop 11. CXR showed triangular shaped opacity in left lower lung concerning for atelectasis vs consolidation. She was placed on vent support with peep of 8cmH2O. She was started on vancomycin and cefepime for concern of pneumonia.    PCCM called to admit the patient.   She reports increased tracheostomy secretions recently. Denies fevers and chills. She does have nausea without vomiting. Chest pain had resolved at time of PCCM evaluation.  She was treated with pulmonary hygiene - positive pressure, hypertonic saline neb and mucinex.  The patient was admitted to ICU for evaluation.  She was empirically treated with IV broad spectrum abx.  She quickly was returned to baseline of ATC during day, vent QHS. Her PCT was negative. ABX were de-escalated.She was evaluated by PT and felt to be at baseline.  The patient was medically cleared for discharge back to [facility] on 2/9 with plans as below.       DISCHARGE PLAN BY DIAGNOSIS      Chest Pain   Discharge Plan: -EKG without acute ST changes, troponin flat. Pain spontaneously resolved.   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 LLL Atelectasis  Chronic Tracheostomy with Nocturnal Ventilator Dependence OHS / OSA  Hx PE - 2021, treated  Discharge Plan: -ATC during day -vent at night per prior settings -limit all sedating medications  -ensure pulmonary hygiene - mobilize as able / upright positioning  DM II   Discharge Plan: -glucose control per [facility]  -no SSI on prior med review or oral agents   Bipolar Disorder  Chronic Pain   Discharge Plan: -continue home medications, consider slow reduction of potentially sedating agents given her OHS/OSA  Nausea   Discharge Plan: -PRN zofran   Chronic Deconditioning  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 Also used for abrasion of the hand, chafing of the skin, dermatitis due to sweating and friction, friction dermatitis, friction eczema,  and genital/thigh intertrigo.  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 Pat dry Apply Gerhardt's barrier ointment to the affected  areas  Cleanse under pannus removing all powders and creams. Use antimicrobial wicking fabric under pannus, see orders.    Low air loss mattress in place for moisture management and pressure redistribution.  If the patient moves out of the ICU would recommend bariatric air mattress.                            KEY EVENTS 2/7 Admit  2/8 ABX stopped, PCT negative.  2/9 PT eval, at baseline, on ATC with PMV in place.   SIGNIFICANT DIAGNOSTIC STUDIES 2/7 CXR > triangular shaped LLL opacity (atx vs consolidation) 2/9 CXR > low volumes, no acute process, RUE PICC in good position   MICRO DATA  2/7 COVID / Flu A&B / RSV >> negative 2/8 MRSA PCR >> negative    Discharge Exam: General: morbidly obese female sitting up in bed watching cartoons, in NAD Neuro: AAOx4, speech clear, MAE,  CV: s1s2 RRR, no m/r/g PULM: #6 proximal XLT trach midline clean/dry, good air entry bilaterally  GI: soft/obese, bsx4 active, tolerating PO's Extremities: warm/dry, BLE trace edema   Vitals:   10/27/22 0600 10/27/22 0700 10/27/22 0800 10/27/22 0806  BP: (!) 160/135 (!) 145/110 (!) 142/101   Pulse: 76 70 91 83  Resp: 15 13  (!) 22  Temp:   98.5 Kim Jenkins (36.9 C)   TempSrc:   Oral   SpO2: 100% 100% 100% 100%  Weight:         Discharge Labs  BMET Recent Labs  Lab 10/25/22 2206 10/25/22 2355 10/26/22 0157 10/27/22 0153  NA 136 137 136 137  K 4.1 4.3 4.1 4.6  CL 96*  --  94* 96*  CO2 31  --  33* 33*  GLUCOSE 161*  --  109* 130*  BUN 12  --  14 14  CREATININE 0.67  --  0.58 0.71  CALCIUM 8.9  --  8.8* 9.0  MG  --   --  1.8  --   PHOS  --   --  3.5  --     CBC Recent Labs  Lab 10/25/22 2206 10/25/22 2355 10/26/22 0157  HGB 11.3* 11.2* 10.5*  HCT 37.4 33.0* 33.8*  WBC 5.9  --  6.4  PLT 147*  --  167      Discharge Instructions     Call MD for:  difficulty breathing, headache or visual disturbances   Complete by: As directed    Call MD for:  extreme fatigue   Complete by: As  directed    Call MD for:  hives   Complete by: As directed    Call MD for:  persistant dizziness or light-headedness   Complete by: As directed    Call MD for:  persistant nausea and vomiting   Complete by: As directed    Call MD for:  redness, tenderness, or signs of infection (pain,  swelling, redness, odor or green/yellow discharge around incision site)   Complete by: As directed    Call MD for:  severe uncontrolled pain   Complete by: As directed    Call MD for:  temperature >100.4   Complete by: As directed    Diet general   Complete by: As directed    Discharge instructions   Complete by: As directed    1. Leave all tubes / lines in place for discharge  2. Review medication reconciliation carefully as they may have changed.  3. Continue ATC during day, Vent at night  4. PMV as tolerated.  5. Return if new or worsening symptoms.   Discharge wound care:   Complete by: As directed    [place] Nurse Consult Note: Reason for Consult: pressure injuries and MASD Patient from [place], vent dependent, indwelling FC, mobid obesity  Wound type:  ICD (irritant contact dermatitis) ICD-10 CM Codes for Irritant Dermatitis L30.4  - Erythema intertrigo. Also used for abrasion of the hand, chafing of the skin, dermatitis due to sweating and friction, friction dermatitis, friction eczema, and genital/thigh intertrigo.  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 Pat dry Apply Gerhardt's  barrier ointment to the affected areas  Cleanse under pannus removing all powders and creams. Use antimicrobial wicking fabric under pannus, see orders.   Increase activity slowly   Complete by: As directed          [facility] Follow up.   Why: Follow up with care team at [facility] (MD, SLP, PT, OT, Wound Care). Contact information: [address] [place] [phone]                 Allergies as of 10/27/2022       Reactions   Meperidine And Related Shortness Of Breath, Other (See Comments)   "Allergic," per MAR   Cefazolin Other (See Comments)   "Allergic," per [facility]        Medication List     TAKE these medications    divalproex 500 MG 24 hr tablet Commonly known as: DEPAKOTE ER Take 2 tablets (1,000 mg total) by mouth every 12 (twelve) hours.   erythromycin ophthalmic ointment Place 1 Application into both eyes 3 (three) times daily.   famotidine 40 MG tablet Commonly known as: PEPCID Take 40 mg by mouth 2 (two) times daily.   fentaNYL 25 MCG/HR Commonly known as: [place] 1 patch onto the skin every 3 (three) days.   FLUoxetine 20 MG capsule Commonly known as: PROZAC Take 20 mg by mouth every morning.   gabapentin 300 MG capsule Commonly known as: NEURONTIN Take 300 mg by mouth 2 (two) times daily.   guaiFENesin 600 MG 12 hr tablet Commonly known as: MUCINEX Take 1 tablet (600 mg total) by mouth 2 (two) times daily.   hydrALAZINE 50 MG tablet Commonly known as: APRESOLINE Take 50 mg by mouth every 6 (six) hours as needed (SBP > 160 and DPB > 100).   loperamide  2 MG tablet Commonly known as: IMODIUM A-D Take 2 mg by mouth every 6 (six) hours as needed for diarrhea or loose stools.   multivitamin with minerals Tabs tablet Take 1 tablet by mouth daily.   mupirocin ointment 2 % Commonly known as: BACTROBAN Place 1 Application into the nose 2 (two) times daily.    nitroGLYCERIN 0.4 MG SL tablet Commonly known as: NITROSTAT Place 0.4 mg under the tongue every 5 (five) minutes x 3 doses as needed for chest pain (AND CALL PROVIDER).   omeprazole 40 MG capsule Commonly known as: PRILOSEC Take 40 mg by mouth every morning.   ondansetron 4 MG disintegrating tablet Commonly known as: ZOFRAN-ODT Take 4 mg by mouth every 6 (six) hours as needed for nausea or vomiting (DISSOLVE ORALLY).   polyethylene glycol 17 g packet Commonly known as: MIRALAX / GLYCOLAX Take 17 g by mouth every 12 (twelve) hours as needed for mild constipation.   sodium chloride 0.65 % Soln nasal spray Commonly known as: OCEAN Place 2 sprays into both nostrils 2 (two) times daily as needed for congestion.   Systane Hydration PF 0.4-0.3 % Soln Generic drug: Polyethyl Glyc-Propyl Glyc PF Place 1 drop into both eyes 3 (three) times daily.   venlafaxine XR 75 MG 24 hr capsule Commonly known as: EFFEXOR-XR Take 75 mg by mouth every morning.   Vraylar 4.5 MG Caps Generic drug: Cariprazine HCl Take 4.5 mg by mouth every morning.   Zinc Oxide 12.8 % ointment Commonly known as: TRIPLE PASTE Apply topically 2 (two) times daily.               Discharge Care Instructions  (From admission, onward)           Start     Ordered   10/27/22 0000  Discharge wound care:       Comments: [facility] Nurse Consult Note: Reason for Consult: pressure injuries and MASD Patient from [place], vent dependent, indwelling FC, mobid obesity  Wound type:  ICD (irritant contact dermatitis) ICD-10 CM Codes for Irritant Dermatitis L30.4  - Erythema intertrigo. Also used for abrasion of the hand, chafing of the skin, dermatitis due to sweating and friction, friction dermatitis, friction eczema, and genital/thigh intertrigo.  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 Pat dry Apply Gerhardt's barrier ointment to the affected areas  Cleanse under pannus removing all powders and creams. Use antimicrobial wicking fabric under pannus, see orders.   10/27/22 1102              Disposition: [facility]   Discharged Condition: Kyrie Barnell has met maximum benefit of inpatient care and is medically stable and cleared for discharge.  Patient is pending follow up as above.      Time spent on disposition:  Greater than 35 Minutes.     Signed: Noe Gens, MSN, APRN, NP-C, AGACNP-BC  Pulmonary & Critical Care 10/27/2022, 11:14 AM   Please see [email or web address] for pager details.

## 2022-10-27 NOTE — Evaluation (Signed)
Physical Therapy Evaluation/ Discharge Patient Details Name: Tommie Petrov MRN: [id number] DOB: 16-Jun-1966 Today's Date: 10/27/2022  History of Present Illness  57 yo female admitted 2/7 from [facility] for chest pain. PMHx: obesity, chronic trach with nocturnal vent, bipolar disorder, DMII, and OSA/OHS  Clinical Impression  Pt pleasant and reports being long-term resident at [facility] for 7 years with inability to ambulate for most of that time. Pt is max assist for ADLs and mobility at bed level at baseline with staff lifting her OOB at least 1x/month. Pt does nto currently receive therapy at [facility] and reports current function as baseline. Pt assisted to roll and perform pericare and RN aware of need for lift for mobility. No further acute therapy needs at this time with pt aware and agreeable, will sign off.     30% trach collar, SPO2 98% HR 84     Recommendations for follow up therapy are one component of a multi-disciplinary discharge planning process, led by the attending physician.  Recommendations may be updated based on patient status, additional functional criteria and insurance authorization.  Follow Up Recommendations Long-term institutional care without follow-up therapy Can patient physically be transported by private vehicle: No    Assistance Recommended at Discharge Frequent or constant Supervision/Assistance  Patient can return home with the following  A lot of help with walking and/or transfers;A lot of help with bathing/dressing/bathroom;Assistance with cooking/housework;Assist for transportation    Equipment Recommendations None recommended by PT  Recommendations for Other Services       Functional Status Assessment Patient has not had a recent decline in their functional status     Precautions / Restrictions Precautions Precautions: Other (comment);Fall Precaution Comments: trach      Mobility  Bed Mobility Overal bed mobility: Needs Assistance Bed Mobility:  Rolling, Supine to Sit Rolling: Max assist         General bed mobility comments: pt initially stating she felt she could pivot to EOB however with initiation pt could barely move legs toward HOB, unable to lift trunk. utilized bed positioning to transition to upright chair. Rolling bil with max assist and cues for sequence for pericare.Total +2 to slide toward [facility]    Transfers                        Ambulation/Gait                  Stairs            Wheelchair Mobility    Modified Rankin (Stroke Patients Only)       Balance                                             Pertinent Vitals/Pain Pain Assessment Pain Score: 4  Pain Location: perineal area    Home Living Family/patient expects to be discharged to:: Skilled nursing facility                        Prior Function Prior Level of Function : Needs assist       Physical Assist : Mobility (physical);ADLs (physical) Mobility (physical): Bed mobility;Transfers   Mobility Comments: assist to roll and position in bed at baseline with total assist to lift OOB 1x/month per pt ADLs Comments: assist for all ADLs, self feeds  Hand Dominance        Extremity/Trunk Assessment   Upper Extremity Assessment Upper Extremity Assessment: Generalized weakness (limited bil shoulder flexion grossly 90 degrees, strength grossly 2/5)    Lower Extremity Assessment Lower Extremity Assessment: Generalized weakness (strength grossly 2/5)    Cervical / Trunk Assessment Cervical / Trunk Assessment: Other exceptions Cervical / Trunk Exceptions: increased body habitus  Communication   Communication: Tracheostomy;Passy-Muir valve  Cognition Arousal/Alertness: Awake/alert Behavior During Therapy: WFL for tasks assessed/performed Overall Cognitive Status: Impaired/Different from baseline Area of Impairment: Safety/judgement                          Safety/Judgement: Decreased awareness of deficits              General Comments      Exercises     Assessment/Plan    PT Assessment Patient does not need any further PT services  PT Problem List         PT Treatment Interventions      PT Goals (Current goals can be found in the Care Plan section)  Acute Rehab PT Goals PT Goal Formulation: All assessment and education complete, DC therapy    Frequency       Co-evaluation               AM-PAC PT "6 Clicks" Mobility  Outcome Measure Help needed turning from your back to your side while in a flat bed without using bedrails?: Total Help needed moving from lying on your back to sitting on the side of a flat bed without using bedrails?: Total Help needed moving to and from a bed to a chair (including a wheelchair)?: Total Help needed standing up from a chair using your arms (e.g., wheelchair or bedside chair)?: Total Help needed to walk in hospital room?: Total Help needed climbing 3-5 steps with a railing? : Total 6 Click Score: 6    End of Session   Activity Tolerance: Patient tolerated treatment well Patient left: in bed;with call bell/phone within reach Nurse Communication: Mobility status;Need for lift equipment PT Visit Diagnosis: Other abnormalities of gait and mobility (R26.89)    Time: [id number] PT Time Calculation (min) (ACUTE ONLY): 21 min   Charges:   PT Evaluation $PT Eval Moderate Complexity: 1 Mod          [place], PT Acute Rehabilitation Services Office: [phone]   Lamarr Lulas 10/27/2022, 9:47 AM

## 2022-10-27 NOTE — TOC Transition Note (Signed)
Transition of Care [facility]) - CM/SW Discharge Note   Patient Details  Name: Kim Jenkins MRN: [id number] Date of Birth: [birth year]  [facility] CM/SW Contact:  Joanne Chars, LCSW Phone Number: 10/27/2022, 11:31 AM   Clinical Narrative:   Pt discharging to [facility] subacute unit, room 312.  Accepting MD is Dr Nona Dell.  RN call report to [phone].  (Main number is [phone])    Final next level of care: Skilled Nursing Facility Barriers to Discharge: Barriers Resolved   Patient Goals and CMS Choice      Discharge Placement                  Patient to be transferred to facility by: [facility] Name of family member notified: both contacts have phones that are disconnected. Patient and family notified of of transfer: 10/27/22  Discharge Plan and Services Additional resources added to the After Visit Summary for                                       Social Determinants of Health (SDOH) Interventions SDOH Screenings   Tobacco Use: Low Risk  (10/25/2022)     Readmission Risk Interventions     No data to display

## 2022-10-27 NOTE — Progress Notes (Signed)
RT NOTE: patient just recently finished eating breakfast.  Will hold on AM CPT at this time.  Tolerating ATC well at this time.  Will continue to monitor.

## 2022-10-27 NOTE — Progress Notes (Signed)
CPT not performed, pt being discharged at this time.

## 2022-10-27 NOTE — Progress Notes (Signed)
SLP Cancellation Note  Patient Details Name: Kim Jenkins MRN: [id number] DOB: [birth year]   Cancelled treatment:        Spoke with NP. CXR today with "Low lung volumes without evidence of acute cardiopulmonary disease." Pneumonia less likely.  Pt does not need instrumental assessment of swallowing at this time.  SLP will sign off.  Please reconsult ST if there is a change in functional status.    Celedonio Savage, MA, [place] Office: [phone] 10/27/2022, 9:59 AM

## 2022-10-30 LAB — LEGIONELLA PNEUMOPHILA SEROGP 1 UR AG: L. pneumophila Serogp 1 Ur Ag: NEGATIVE

## 2022-10-31 DIAGNOSIS — G4733 Obstructive sleep apnea (adult) (pediatric): Secondary | ICD-10-CM | POA: Diagnosis not present

## 2022-10-31 DIAGNOSIS — J189 Pneumonia, unspecified organism: Secondary | ICD-10-CM

## 2022-10-31 DIAGNOSIS — E662 Morbid (severe) obesity with alveolar hypoventilation: Secondary | ICD-10-CM

## 2022-10-31 DIAGNOSIS — J9621 Acute and chronic respiratory failure with hypoxia: Secondary | ICD-10-CM | POA: Diagnosis not present

## 2022-10-31 DIAGNOSIS — F319 Bipolar disorder, unspecified: Secondary | ICD-10-CM | POA: Diagnosis not present

## 2022-10-31 DIAGNOSIS — E119 Type 2 diabetes mellitus without complications: Secondary | ICD-10-CM | POA: Diagnosis not present

## 2022-10-31 LAB — CULTURE, BLOOD (ROUTINE X 2)
Culture: NO GROWTH
Culture: NO GROWTH
Special Requests: ADEQUATE

## 2022-11-08 ENCOUNTER — Other Ambulatory Visit: Payer: Self-pay | Admitting: Internal Medicine

## 2022-11-14 DIAGNOSIS — F319 Bipolar disorder, unspecified: Secondary | ICD-10-CM

## 2022-11-14 DIAGNOSIS — G4733 Obstructive sleep apnea (adult) (pediatric): Secondary | ICD-10-CM | POA: Diagnosis not present

## 2022-11-14 DIAGNOSIS — J189 Pneumonia, unspecified organism: Secondary | ICD-10-CM | POA: Diagnosis not present

## 2022-11-14 DIAGNOSIS — E662 Morbid (severe) obesity with alveolar hypoventilation: Secondary | ICD-10-CM

## 2022-11-14 DIAGNOSIS — J9621 Acute and chronic respiratory failure with hypoxia: Secondary | ICD-10-CM | POA: Diagnosis not present

## 2022-11-14 DIAGNOSIS — E119 Type 2 diabetes mellitus without complications: Secondary | ICD-10-CM | POA: Diagnosis not present

## 2022-11-28 DIAGNOSIS — E662 Morbid (severe) obesity with alveolar hypoventilation: Secondary | ICD-10-CM

## 2022-11-28 DIAGNOSIS — J189 Pneumonia, unspecified organism: Secondary | ICD-10-CM | POA: Diagnosis not present

## 2022-11-28 DIAGNOSIS — E119 Type 2 diabetes mellitus without complications: Secondary | ICD-10-CM | POA: Diagnosis not present

## 2022-11-28 DIAGNOSIS — G4733 Obstructive sleep apnea (adult) (pediatric): Secondary | ICD-10-CM | POA: Diagnosis not present

## 2022-11-28 DIAGNOSIS — F319 Bipolar disorder, unspecified: Secondary | ICD-10-CM

## 2022-11-28 DIAGNOSIS — J9621 Acute and chronic respiratory failure with hypoxia: Secondary | ICD-10-CM | POA: Diagnosis not present

## 2022-11-30 ENCOUNTER — Other Ambulatory Visit: Payer: Self-pay | Admitting: Internal Medicine

## 2022-11-30 MED ORDER — FENTANYL 25 MCG/HR TD PT72: 1.0000 | MEDICATED_PATCH | TRANSDERMAL | 0 refills | Status: DC

## 2022-12-05 ENCOUNTER — Other Ambulatory Visit: Payer: Self-pay | Admitting: Internal Medicine

## 2022-12-12 DIAGNOSIS — J9621 Acute and chronic respiratory failure with hypoxia: Secondary | ICD-10-CM | POA: Diagnosis not present

## 2022-12-12 DIAGNOSIS — E119 Type 2 diabetes mellitus without complications: Secondary | ICD-10-CM | POA: Diagnosis not present

## 2022-12-12 DIAGNOSIS — G4733 Obstructive sleep apnea (adult) (pediatric): Secondary | ICD-10-CM | POA: Diagnosis not present

## 2022-12-12 DIAGNOSIS — J189 Pneumonia, unspecified organism: Secondary | ICD-10-CM | POA: Diagnosis not present

## 2022-12-12 DIAGNOSIS — F319 Bipolar disorder, unspecified: Secondary | ICD-10-CM

## 2022-12-12 DIAGNOSIS — E662 Morbid (severe) obesity with alveolar hypoventilation: Secondary | ICD-10-CM

## 2022-12-26 DIAGNOSIS — J189 Pneumonia, unspecified organism: Secondary | ICD-10-CM | POA: Diagnosis not present

## 2022-12-26 DIAGNOSIS — F319 Bipolar disorder, unspecified: Secondary | ICD-10-CM

## 2022-12-26 DIAGNOSIS — G4733 Obstructive sleep apnea (adult) (pediatric): Secondary | ICD-10-CM | POA: Diagnosis not present

## 2022-12-26 DIAGNOSIS — E119 Type 2 diabetes mellitus without complications: Secondary | ICD-10-CM | POA: Diagnosis not present

## 2022-12-26 DIAGNOSIS — J9621 Acute and chronic respiratory failure with hypoxia: Secondary | ICD-10-CM | POA: Diagnosis not present

## 2022-12-26 DIAGNOSIS — E662 Morbid (severe) obesity with alveolar hypoventilation: Secondary | ICD-10-CM

## 2023-01-09 DIAGNOSIS — J9621 Acute and chronic respiratory failure with hypoxia: Secondary | ICD-10-CM | POA: Diagnosis not present

## 2023-01-09 DIAGNOSIS — E119 Type 2 diabetes mellitus without complications: Secondary | ICD-10-CM | POA: Diagnosis not present

## 2023-01-09 DIAGNOSIS — E662 Morbid (severe) obesity with alveolar hypoventilation: Secondary | ICD-10-CM

## 2023-01-09 DIAGNOSIS — G4733 Obstructive sleep apnea (adult) (pediatric): Secondary | ICD-10-CM | POA: Diagnosis not present

## 2023-01-09 DIAGNOSIS — J189 Pneumonia, unspecified organism: Secondary | ICD-10-CM | POA: Diagnosis not present

## 2023-01-09 DIAGNOSIS — F319 Bipolar disorder, unspecified: Secondary | ICD-10-CM

## 2023-01-16 ENCOUNTER — Other Ambulatory Visit: Payer: Self-pay | Admitting: Internal Medicine

## 2023-01-23 DIAGNOSIS — F319 Bipolar disorder, unspecified: Secondary | ICD-10-CM

## 2023-01-23 DIAGNOSIS — J9621 Acute and chronic respiratory failure with hypoxia: Secondary | ICD-10-CM | POA: Diagnosis not present

## 2023-01-23 DIAGNOSIS — G4733 Obstructive sleep apnea (adult) (pediatric): Secondary | ICD-10-CM | POA: Diagnosis not present

## 2023-01-23 DIAGNOSIS — J189 Pneumonia, unspecified organism: Secondary | ICD-10-CM | POA: Diagnosis not present

## 2023-01-23 DIAGNOSIS — E662 Morbid (severe) obesity with alveolar hypoventilation: Secondary | ICD-10-CM

## 2023-01-23 DIAGNOSIS — E119 Type 2 diabetes mellitus without complications: Secondary | ICD-10-CM | POA: Diagnosis not present

## 2023-02-06 DIAGNOSIS — J9621 Acute and chronic respiratory failure with hypoxia: Secondary | ICD-10-CM | POA: Diagnosis not present

## 2023-02-06 DIAGNOSIS — E119 Type 2 diabetes mellitus without complications: Secondary | ICD-10-CM | POA: Diagnosis not present

## 2023-02-06 DIAGNOSIS — F319 Bipolar disorder, unspecified: Secondary | ICD-10-CM

## 2023-02-06 DIAGNOSIS — E662 Morbid (severe) obesity with alveolar hypoventilation: Secondary | ICD-10-CM

## 2023-02-06 DIAGNOSIS — G4733 Obstructive sleep apnea (adult) (pediatric): Secondary | ICD-10-CM | POA: Diagnosis not present

## 2023-02-06 DIAGNOSIS — J189 Pneumonia, unspecified organism: Secondary | ICD-10-CM | POA: Diagnosis not present

## 2023-02-14 ENCOUNTER — Emergency Department (HOSPITAL_COMMUNITY): Payer: Medicare Other

## 2023-02-14 ENCOUNTER — Other Ambulatory Visit: Payer: Self-pay

## 2023-02-14 ENCOUNTER — Emergency Department (HOSPITAL_COMMUNITY)
Admission: EM | Admit: 2023-02-14 | Discharge: 2023-02-15 | Disposition: A | Discharge status: Other Acute Inpatient Hospital | Payer: Medicare Other | Attending: Emergency Medicine | Admitting: Emergency Medicine

## 2023-02-14 DIAGNOSIS — G8929 Other chronic pain: Secondary | ICD-10-CM | POA: Insufficient documentation

## 2023-02-14 DIAGNOSIS — I10 Essential (primary) hypertension: Secondary | ICD-10-CM | POA: Insufficient documentation

## 2023-02-14 DIAGNOSIS — J449 Chronic obstructive pulmonary disease, unspecified: Secondary | ICD-10-CM | POA: Insufficient documentation

## 2023-02-14 DIAGNOSIS — I110 Hypertensive heart disease with heart failure: Secondary | ICD-10-CM | POA: Diagnosis not present

## 2023-02-14 DIAGNOSIS — I509 Heart failure, unspecified: Secondary | ICD-10-CM | POA: Diagnosis not present

## 2023-02-14 DIAGNOSIS — E119 Type 2 diabetes mellitus without complications: Secondary | ICD-10-CM | POA: Insufficient documentation

## 2023-02-14 DIAGNOSIS — R079 Chest pain, unspecified: Secondary | ICD-10-CM | POA: Insufficient documentation

## 2023-02-14 LAB — CBC
HCT: 39.8 % (ref 36.0–46.0)
Hemoglobin: 12.3 g/dL (ref 12.0–15.0)
MCH: 27.3 pg (ref 26.0–34.0)
MCHC: 30.9 g/dL (ref 30.0–36.0)
MCV: 88.4 fL (ref 80.0–100.0)
Platelets: 227 10*3/uL (ref 150–400)
RBC: 4.5 MIL/uL (ref 3.87–5.11)
RDW: 14.9 % (ref 11.5–15.5)
WBC: 8.7 10*3/uL (ref 4.0–10.5)
nRBC: 0 % (ref 0.0–0.2)

## 2023-02-14 LAB — BASIC METABOLIC PANEL
Anion gap: 12 (ref 5–15)
BUN: 25 mg/dL — ABNORMAL HIGH (ref 6–20)
CO2: 30 mmol/L (ref 22–32)
Calcium: 9.2 mg/dL (ref 8.9–10.3)
Chloride: 93 mmol/L — ABNORMAL LOW (ref 98–111)
Creatinine, Ser: 0.72 mg/dL (ref 0.44–1.00)
GFR, Estimated: >60 mL/min (ref >60)
Glucose, Bld: 179 mg/dL — ABNORMAL HIGH (ref 70–99)
Potassium: 4.8 mmol/L (ref 3.5–5.1)
Sodium: 135 mmol/L (ref 135–145)

## 2023-02-14 LAB — TROPONIN I (HIGH SENSITIVITY)
Troponin I (High Sensitivity): 6 ng/L (ref <18)
Troponin I (High Sensitivity): 6 ng/L (ref <18)

## 2023-02-14 LAB — BRAIN NATRIURETIC PEPTIDE: B Natriuretic Peptide: 92.6 pg/mL (ref 0.0–100.0)

## 2023-02-14 MED ORDER — MORPHINE SULFATE (PF) 4 MG/ML IV SOLN
4.0000 mg | Freq: Once | INTRAVENOUS | Status: AC
  Administered 2023-02-14: 4 mg via INTRAVENOUS
  Filled 2023-02-14: qty 1

## 2023-02-14 MED ORDER — IPRATROPIUM-ALBUTEROL 0.5-2.5 (3) MG/3ML IN SOLN
3.0000 mL | Freq: Once | RESPIRATORY_TRACT | Status: AC
  Administered 2023-02-14: 3 mL via RESPIRATORY_TRACT
  Filled 2023-02-14: qty 3

## 2023-02-14 NOTE — ED Provider Notes (Signed)
[place] EMERGENCY DEPARTMENT AT [facility] Provider Note   CSN: [id number] Arrival date & time: 02/14/23  1830     History  No chief complaint on file.   Kim Jenkins is a 57 y.o. 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  She states that she had midsternal chest pain that started yesterday.  Denies associated shortness of breath, fever, chills, diaphoresis, lightheadedness, dizziness, or syncope.  History of heart failure.  No history of CAD/MI.  States that she was evaluated outpatient this morning and was sent to the ED for possible elevated troponin of 18. No leg pain or swelling. No radiating chest pain.       Home Medications Prior to Admission medications   Medication Sig Start Date End Date Taking? Authorizing Provider  divalproex (DEPAKOTE ER) 500 MG 24 hr tablet Take 2 tablets (1,000 mg total) by mouth every 12 (twelve) hours. 09/13/18   Ward, Chase Picket, PA-C  erythromycin ophthalmic ointment Place 1 Application into both eyes 3 (three) times daily.    [provider]  famotidine (PEPCID) 40 MG tablet Take 40 mg by mouth 2 (two) times daily.    [provider]  fentaNYL (DURAGESIC) 25 MCG/HR Place 1 patch onto the skin every 3 (three) days. 11/30/22   Crist Fat, MD  FLUoxetine (PROZAC) 20 MG capsule Take 20 mg by mouth every morning. 08/25/22   [provider]  gabapentin (NEURONTIN) 300 MG capsule Take 300 mg by mouth 2 (two) times daily.    [provider]  guaiFENesin (MUCINEX) 600 MG 12 hr tablet Take 1 tablet (600 mg total) by mouth 2 (two) times daily. 10/27/22   Jeanella Craze, NP  hydrALAZINE (APRESOLINE) 50 MG tablet Take 50 mg by mouth every 6 (six) hours as needed (SBP > 160 and DPB > 100).    [provider]  loperamide (IMODIUM  A-D) 2 MG tablet Take 2 mg by mouth every 6 (six) hours as needed for diarrhea or loose stools.     [provider]  Multiple Vitamin (MULTIVITAMIN WITH MINERALS) TABS tablet Take 1 tablet by mouth daily.    [provider]  mupirocin ointment (BACTROBAN) 2 % Place 1 Application into the nose 2 (two) times daily. Patient not taking: Reported on 10/26/2022 09/12/22   Lorin Glass, MD  nitroGLYCERIN (NITROSTAT) 0.4 MG SL tablet Place 0.4 mg under the tongue every 5 (five) minutes x 3 doses as needed for chest pain (AND CALL PROVIDER).     [provider]  omeprazole (PRILOSEC) 40 MG capsule Take 40 mg by mouth every morning. 08/12/22   [provider]  ondansetron (ZOFRAN-ODT) 4 MG disintegrating tablet Take 4 mg by mouth every 6 (six) hours as needed for nausea or vomiting (DISSOLVE ORALLY).    [provider]  Polyethyl Glyc-Propyl Glyc PF (SYSTANE HYDRATION PF) 0.4-0.3 % SOLN Place 1 drop into both eyes 3 (three) times daily.    [provider]  polyethylene glycol (MIRALAX / GLYCOLAX) 17 g packet Take 17 g by mouth every 12 (twelve) hours as needed for mild constipation.    [provider]  sodium chloride (OCEAN) 0.65 % SOLN nasal spray Place 2 sprays into both nostrils 2 (two) times daily as needed for congestion.    [provider]  venlafaxine XR (EFFEXOR-XR) 75  MG 24 hr capsule Take 75 mg by mouth every morning.    [provider]  VRAYLAR 4.5 MG CAPS Take 4.5 mg by mouth every morning. 08/16/22   [provider]  Zinc Oxide (TRIPLE PASTE) 12.8 % ointment Apply topically 2 (two) times daily. Patient not taking: Reported on 10/26/2022 09/12/22   Lorin Glass, MD      Allergies    Meperidine and related and Cefazolin    Review of Systems   Review of Systems  All other systems reviewed and are negative.   Physical Exam Updated Vital Signs BP 134/83 (BP Location: Right Arm)   Pulse 75   Temp  98.5 F (36.9 C) (Oral)   Resp (!) 21   Ht 5\' 6"  (1.676 m)   Wt 119.2 kg   SpO2 97%   BMI 42.42 kg/m  Physical Exam Vitals and nursing note reviewed.  Constitutional:      General: She is not in acute distress.    Appearance: Normal appearance.  HENT:     Head: Normocephalic and atraumatic.     Mouth/Throat:     Mouth: Mucous membranes are moist.  Eyes:     General: No scleral icterus.    Conjunctiva/sclera: Conjunctivae normal.  Neck:     Comments: Trach in place Cardiovascular:     Rate and Rhythm: Normal rate and regular rhythm.     Heart sounds: No murmur heard. Pulmonary:     Effort: Pulmonary effort is normal. No respiratory distress.     Breath sounds: No stridor. Wheezing (minimal scattered) present. No rhonchi or rales.  Chest:     Chest wall: No tenderness.  Abdominal:     General: Abdomen is flat. There is no distension.     Palpations: Abdomen is soft.     Tenderness: There is no abdominal tenderness. There is no guarding or rebound.  Musculoskeletal:        General: Normal range of motion.     Right lower leg: No edema.     Left lower leg: No edema.  Skin:    General: Skin is warm and dry.     Capillary Refill: Capillary refill takes less than 2 seconds.     Coloration: Skin is not jaundiced or pale.  Neurological:     Mental Status: She is alert. Mental status is at baseline.  Psychiatric:        Mood and Affect: Mood normal.        Behavior: Behavior normal.     ED Results / Procedures / Treatments   Labs (all labs ordered are listed, but only abnormal results are displayed) Labs Reviewed  BASIC METABOLIC PANEL - Abnormal; Notable for the following components:      Result Value   Chloride 93 (*)    Glucose, Bld 179 (*)    BUN 25 (*)    All other components within normal limits  CBC  BRAIN NATRIURETIC PEPTIDE  TROPONIN I (HIGH SENSITIVITY)  TROPONIN I (HIGH SENSITIVITY)    EKG NSR at 78bpm, no acute ST-T changes, no  STEMI  Radiology DG Chest Port 1 View  Result Date: 02/14/2023 CLINICAL DATA:  Chest pain. EXAM: PORTABLE CHEST 1 VIEW COMPARISON:  October 27, 2022 FINDINGS: Stable tracheostomy tube positioning is noted. The right-sided PICC line seen on the prior study has been removed. The heart size and mediastinal contours are within normal limits. The lungs are hyperinflated. There is no evidence of acute infiltrate, pleural effusion  or pneumothorax. The visualized skeletal structures are unremarkable. IMPRESSION: COPD without evidence of acute or active cardiopulmonary disease. Electronically Signed   By: Aram Candela M.D.   On: 02/14/2023 19:55    Procedures Procedures    Medications Ordered in ED Medications  ipratropium-albuterol (DUONEB) 0.5-2.5 (3) MG/3ML nebulizer solution 3 mL (3 mLs Nebulization Given 02/14/23 2000)  morphine (PF) 4 MG/ML injection 4 mg (4 mg Intravenous Given 02/14/23 2307)    ED Course/ Medical Decision Making/ A&P                             Medical Decision Making Amount and/or Complexity of Data Reviewed Labs: ordered. Decision-making details documented in ED Course. Radiology: ordered. Decision-making details documented in ED Course. ECG/medicine tests: ordered. Decision-making details documented in ED Course.  Risk Prescription drug management.   Medical Decision Making:   Khali Starkovich is a 57 y.o. female who presented to the ED today with chest pain detailed above.    Patient's presentation is complicated by their history of multiple comorbidities.  Patient placed on continuous vitals and telemetry monitoring while in ED which was reviewed periodically.  Complete initial physical exam performed, notably the patient  was in NAD. RRR. LCTA apart from minimal scattered wheezing. Appeared euvolemic. Neurologically intact.    Reviewed and confirmed nursing documentation for past medical history, family history, social history.    Initial Assessment:   With  the patient's presentation of chest pain, the emergent differential diagnosis of chest pain includes: Acute coronary syndrome, pericarditis, aortic dissection, pulmonary embolism, tension pneumothorax, and esophageal rupture.  I do not believe the patient has an emergent cause of chest pain, other urgent/non-acute considerations include, but are not limited to: chronic angina, aortic stenosis, cardiomyopathy, myocarditis, mitral valve prolapse, pulmonary hypertension, hypertrophic obstructive cardiomyopathy (HOCM), aortic insufficiency, right ventricular hypertrophy, pneumonia, pleuritis, bronchitis, pneumothorax, tumor, gastroesophageal reflux disease (GERD), esophageal spasm, Mallory-Weiss syndrome, peptic ulcer disease, biliary disease, pancreatitis, functional gastrointestinal pain, cervical or thoracic disk disease or arthritis, shoulder arthritis, costochondritis, subacromial bursitis, anxiety or panic attack, herpes zoster, breast disorders, chest wall tumors, thoracic outlet syndrome, mediastinitis.    Initial Plan:  Screening labs including CBC and Metabolic panel to evaluate for infectious or metabolic etiology of disease.  Urinalysis with reflex culture ordered to evaluate for UTI or relevant urologic/nephrologic pathology.  CXR to evaluate for structural/infectious intrathoracic pathology.  BNP to assess for fluid overload Breathing treatment for wheezing EKG and troponin to evaluate for cardiac pathology Morphine for chronic back pain Objective evaluation as reviewed   Initial Study Results:   Laboratory  All laboratory results reviewed without evidence of clinically relevant pathology.   Exceptions include: Cl 93, glucose 179   EKG EKG was reviewed independently. ST segments without concerns for elevations.   EKG: normal sinus rhythm.   Radiology:  All images reviewed independently. Agree with radiology report at this time.   DG Chest Port 1 View  Result Date:  02/14/2023 CLINICAL DATA:  Chest pain. EXAM: PORTABLE CHEST 1 VIEW COMPARISON:  October 27, 2022 FINDINGS: Stable tracheostomy tube positioning is noted. The right-sided PICC line seen on the prior study has been removed. The heart size and mediastinal contours are within normal limits. The lungs are hyperinflated. There is no evidence of acute infiltrate, pleural effusion or pneumothorax. The visualized skeletal structures are unremarkable. IMPRESSION: COPD without evidence of acute or active cardiopulmonary disease. Electronically Signed   By: Waylan Rocher  [place] M.D.   On: 02/14/2023 19:55      Final Assessment and Plan:   57 year old female presents to the ED for evaluation of chest pain.  Questionable history of heart failure per patient though not currently managed by cardiology.  She appears euvolemic on exam.  She has minimal scattered wheezing.  No history of COPD.  Not a current tobacco user.  This resolved with breathing treatment.  No signs of respiratory distress.  Sent to ED for evaluation after 1 day of chest pain with questionable elevated troponin.  Reports that troponin returned at 18.  Suspect this is high-sensitivity troponin.  No acute ST-T changes on EKG today.  Patient with minimal chest pain on initial exam.  No radiating chest pain or associated diaphoresis, lightheadedness, dizziness, or syncope.  No personal history of CAD per patient.  She is in no acute distress.  Vital signs reassuring.  Workup initiated as above for further assessment.  Chest x-ray with COPD changes but no other significant findings.  Flat troponin x 2 but states.  Normal BNP.  No other significant findings and lab work today.  Discussed reassuring workup with patient.  She does have some risk factors for heart disease so we will refer her to cardiology for outpatient follow-up.  She is agreeable to this plan.  Strict ED return precautions given, all questions answered, and stable for discharge.  Clinical  Impression:  1. Chest pain, unspecified type   2. Chronic obstructive pulmonary disease, unspecified COPD type [facility])      Discharge           Final Clinical Impression(s) / ED Diagnoses Final diagnoses:  Chest pain, unspecified type  Chronic obstructive pulmonary disease, unspecified COPD type [facility])    Rx / DC Orders ED Discharge Orders          Ordered    Ambulatory referral to Cardiology        02/14/23 2330              Tonette Lederer, PA-C 02/14/23 2338    Rolan Bucco, MD 02/14/23 2345

## 2023-02-14 NOTE — Discharge Instructions (Signed)
Thank you for letting us take care of you today.  Both of your cardiac enzymes were normal. Your EKG today did not show signs of a heart attack. Your chest x-ray showed changes related to COPD but no acute infection or other complication today. With your other health conditions, I do think you would benefit from seeing a cardiology. I referred you to cardiology. They should call you within 72 hours to set up an appointment. If you do not hear from them, please call to schedule this appointment.  For any new or worsening symptoms such as significant chest pain, shortness of breath, lightheadedness, dizziness, loss of consciousness, or other new, concerning symptoms, please return to nearest ED for re-evaluation.

## 2023-02-14 NOTE — ED Triage Notes (Signed)
Pt BIB [facility] from [facility] for CP.   CP began yesterday, pt was given 3 nitro which she responded to well.  Today a trop came back at 32.    Pt is vent dependent at night but came to [place] on a trach collar at 6L & 30%.  She has a 6.0 Shiley XLT trach with a passymuir valve that she uses to talk and eat.

## 2023-02-20 DIAGNOSIS — F319 Bipolar disorder, unspecified: Secondary | ICD-10-CM

## 2023-02-20 DIAGNOSIS — J189 Pneumonia, unspecified organism: Secondary | ICD-10-CM

## 2023-02-20 DIAGNOSIS — E662 Morbid (severe) obesity with alveolar hypoventilation: Secondary | ICD-10-CM

## 2023-02-20 DIAGNOSIS — E119 Type 2 diabetes mellitus without complications: Secondary | ICD-10-CM

## 2023-02-20 DIAGNOSIS — J9621 Acute and chronic respiratory failure with hypoxia: Secondary | ICD-10-CM

## 2023-02-20 DIAGNOSIS — G4733 Obstructive sleep apnea (adult) (pediatric): Secondary | ICD-10-CM

## 2023-03-06 DIAGNOSIS — J9621 Acute and chronic respiratory failure with hypoxia: Secondary | ICD-10-CM | POA: Diagnosis not present

## 2023-03-06 DIAGNOSIS — E662 Morbid (severe) obesity with alveolar hypoventilation: Secondary | ICD-10-CM

## 2023-03-06 DIAGNOSIS — J189 Pneumonia, unspecified organism: Secondary | ICD-10-CM | POA: Diagnosis not present

## 2023-03-06 DIAGNOSIS — F319 Bipolar disorder, unspecified: Secondary | ICD-10-CM

## 2023-03-06 DIAGNOSIS — E119 Type 2 diabetes mellitus without complications: Secondary | ICD-10-CM | POA: Diagnosis not present

## 2023-03-06 DIAGNOSIS — G4733 Obstructive sleep apnea (adult) (pediatric): Secondary | ICD-10-CM | POA: Diagnosis not present

## 2023-03-20 DIAGNOSIS — E119 Type 2 diabetes mellitus without complications: Secondary | ICD-10-CM | POA: Diagnosis not present

## 2023-03-20 DIAGNOSIS — F319 Bipolar disorder, unspecified: Secondary | ICD-10-CM

## 2023-03-20 DIAGNOSIS — G4733 Obstructive sleep apnea (adult) (pediatric): Secondary | ICD-10-CM | POA: Diagnosis not present

## 2023-03-20 DIAGNOSIS — E662 Morbid (severe) obesity with alveolar hypoventilation: Secondary | ICD-10-CM

## 2023-03-20 DIAGNOSIS — J189 Pneumonia, unspecified organism: Secondary | ICD-10-CM | POA: Diagnosis not present

## 2023-03-20 DIAGNOSIS — J9621 Acute and chronic respiratory failure with hypoxia: Secondary | ICD-10-CM | POA: Diagnosis not present

## 2023-03-27 ENCOUNTER — Other Ambulatory Visit: Payer: Self-pay | Admitting: Internal Medicine

## 2023-04-03 DIAGNOSIS — J189 Pneumonia, unspecified organism: Secondary | ICD-10-CM | POA: Diagnosis not present

## 2023-04-03 DIAGNOSIS — E119 Type 2 diabetes mellitus without complications: Secondary | ICD-10-CM | POA: Diagnosis not present

## 2023-04-03 DIAGNOSIS — E662 Morbid (severe) obesity with alveolar hypoventilation: Secondary | ICD-10-CM

## 2023-04-03 DIAGNOSIS — G4733 Obstructive sleep apnea (adult) (pediatric): Secondary | ICD-10-CM | POA: Diagnosis not present

## 2023-04-03 DIAGNOSIS — F319 Bipolar disorder, unspecified: Secondary | ICD-10-CM

## 2023-04-03 DIAGNOSIS — J9621 Acute and chronic respiratory failure with hypoxia: Secondary | ICD-10-CM | POA: Diagnosis not present

## 2023-04-06 ENCOUNTER — Emergency Department (HOSPITAL_COMMUNITY): Payer: Medicare Other

## 2023-04-06 ENCOUNTER — Encounter (HOSPITAL_COMMUNITY): Payer: Self-pay

## 2023-04-06 ENCOUNTER — Emergency Department (HOSPITAL_COMMUNITY)
Admission: EM | Admit: 2023-04-06 | Discharge: 2023-04-07 | Disposition: A | Discharge status: Home / Self Care | Payer: Medicare Other | Attending: Emergency Medicine | Admitting: Emergency Medicine

## 2023-04-06 ENCOUNTER — Other Ambulatory Visit: Payer: Self-pay

## 2023-04-06 DIAGNOSIS — R0789 Other chest pain: Secondary | ICD-10-CM | POA: Diagnosis present

## 2023-04-06 DIAGNOSIS — J189 Pneumonia, unspecified organism: Secondary | ICD-10-CM

## 2023-04-06 DIAGNOSIS — J168 Pneumonia due to other specified infectious organisms: Secondary | ICD-10-CM | POA: Insufficient documentation

## 2023-04-06 DIAGNOSIS — J449 Chronic obstructive pulmonary disease, unspecified: Secondary | ICD-10-CM | POA: Diagnosis not present

## 2023-04-06 DIAGNOSIS — R000 Tachycardia, unspecified: Secondary | ICD-10-CM

## 2023-04-06 DIAGNOSIS — I509 Heart failure, unspecified: Secondary | ICD-10-CM | POA: Diagnosis not present

## 2023-04-06 LAB — BASIC METABOLIC PANEL
Anion gap: 10 (ref 5–15)
BUN: 23 mg/dL — ABNORMAL HIGH (ref 6–20)
CO2: 31 mmol/L (ref 22–32)
Calcium: 9.5 mg/dL (ref 8.9–10.3)
Chloride: 94 mmol/L — ABNORMAL LOW (ref 98–111)
Creatinine, Ser: 0.69 mg/dL (ref 0.44–1.00)
GFR, Estimated: >60 mL/min (ref >60)
Glucose, Bld: 140 mg/dL — ABNORMAL HIGH (ref 70–99)
Potassium: 4.6 mmol/L (ref 3.5–5.1)
Sodium: 135 mmol/L (ref 135–145)

## 2023-04-06 LAB — CBC
HCT: 40.1 % (ref 36.0–46.0)
Hemoglobin: 12.7 g/dL (ref 12.0–15.0)
MCH: 28 pg (ref 26.0–34.0)
MCHC: 31.7 g/dL (ref 30.0–36.0)
MCV: 88.5 fL (ref 80.0–100.0)
Platelets: 217 10*3/uL (ref 150–400)
RBC: 4.53 MIL/uL (ref 3.87–5.11)
RDW: 15 % (ref 11.5–15.5)
WBC: 8.4 10*3/uL (ref 4.0–10.5)
nRBC: 0 % (ref 0.0–0.2)

## 2023-04-06 LAB — TROPONIN I (HIGH SENSITIVITY): Troponin I (High Sensitivity): 9 ng/L (ref <18)

## 2023-04-06 NOTE — ED Notes (Signed)
Patient arrived via [facility] from [facility] c/o cp x 3 hours . Patient took 324 asa pta .is bed bound trach pt on 40% fio. Patient states left side chest pressure is tender to touch has wet congested cough hx of CHF COPD . A/o x 4 respirations even and non labored CP reproducible via palpation worsens with movement

## 2023-04-07 ENCOUNTER — Emergency Department (HOSPITAL_COMMUNITY): Payer: Medicare Other

## 2023-04-07 DIAGNOSIS — J168 Pneumonia due to other specified infectious organisms: Secondary | ICD-10-CM | POA: Diagnosis not present

## 2023-04-07 LAB — TROPONIN I (HIGH SENSITIVITY): Troponin I (High Sensitivity): 10 ng/L (ref <18)

## 2023-04-07 MED ORDER — FENTANYL CITRATE PF 50 MCG/ML IJ SOSY: 100.0000 ug | PREFILLED_SYRINGE | Freq: Once | INTRAMUSCULAR | Status: AC

## 2023-04-07 MED ORDER — ONDANSETRON HCL 4 MG/2ML IJ SOLN
4.0000 mg | Freq: Once | INTRAMUSCULAR | Status: AC
  Administered 2023-04-07: 4 mg via INTRAVENOUS
  Filled 2023-04-07: qty 2

## 2023-04-07 MED ORDER — IOHEXOL 350 MG/ML SOLN
75.0000 mL | Freq: Once | INTRAVENOUS | Status: AC | PRN
  Administered 2023-04-07: 75 mL via INTRAVENOUS

## 2023-04-07 MED ORDER — FENTANYL CITRATE PF 50 MCG/ML IJ SOSY
PREFILLED_SYRINGE | INTRAMUSCULAR | Status: AC
  Administered 2023-04-07: 100 ug via INTRAVENOUS
  Filled 2023-04-07: qty 2

## 2023-04-07 NOTE — ED Notes (Signed)
Pt stable upon departure.  

## 2023-04-07 NOTE — ED Notes (Signed)
Patient to be discharged back to [facility] via ambulance. 2 Attempts made to call and give report with no success,

## 2023-04-07 NOTE — ED Provider Notes (Signed)
[place] EMERGENCY DEPARTMENT AT [facility] Provider Note   CSN: [id number] Arrival date & time: 04/06/23  2249     History  Chief Complaint  Patient presents with   Chest Pain    Patient arrived via [facility] from [facility] c/o left side CP x 3 hours ASA324mg  given vs 134/98 100 20 95% on trach blowby at 15 liters CBG 122    Jacalynn Buzzell is a 57 y.o. female.  Patient sent to the emergency department from [facility].  Patient started to experience left-sided chest pressure at 7 PM.  Pain does not radiate.  She does have chronic trach, on blow-by O2.  She has had a cough.       Home Medications Prior to Admission medications   Medication Sig Start Date End Date Taking? Authorizing Provider  divalproex (DEPAKOTE ER) 500 MG 24 hr tablet Take 2 tablets (1,000 mg total) by mouth every 12 (twelve) hours. 09/13/18   Ward, Chase Picket, PA-C  erythromycin ophthalmic ointment Place 1 Application into both eyes 3 (three) times daily.    [provider]  famotidine (PEPCID) 40 MG tablet Take 40 mg by mouth 2 (two) times daily.    [provider]  fentaNYL (DURAGESIC) 25 MCG/HR Place 1 patch onto the skin every 3 (three) days. 11/30/22   Crist Fat, MD  FLUoxetine (PROZAC) 20 MG capsule Take 20 mg by mouth every morning. 08/25/22   [provider]  gabapentin (NEURONTIN) 300 MG capsule Take 300 mg by mouth 2 (two) times daily.    [provider]  guaiFENesin (MUCINEX) 600 MG 12 hr tablet Take 1 tablet (600 mg total) by mouth 2 (two) times daily. 10/27/22   Jeanella Craze, NP  hydrALAZINE (APRESOLINE) 50 MG tablet Take 50 mg by mouth every 6 (six) hours as needed (SBP > 160 and DPB > 100).    [provider]  loperamide (IMODIUM A-D) 2 MG tablet Take 2 mg by mouth every 6 (six) hours as needed for diarrhea or loose stools.     [provider]  Multiple Vitamin (MULTIVITAMIN WITH MINERALS) TABS tablet Take 1 tablet by mouth daily.     [provider]  mupirocin ointment (BACTROBAN) 2 % Place 1 Application into the nose 2 (two) times daily. Patient not taking: Reported on 10/26/2022 09/12/22   Lorin Glass, MD  nitroGLYCERIN (NITROSTAT) 0.4 MG SL tablet Place 0.4 mg under the tongue every 5 (five) minutes x 3 doses as needed for chest pain (AND CALL PROVIDER).     [provider]  omeprazole (PRILOSEC) 40 MG capsule Take 40 mg by mouth every morning. 08/12/22   [provider]  ondansetron (ZOFRAN-ODT) 4 MG disintegrating tablet Take 4 mg by mouth every 6 (six) hours as needed for nausea or vomiting (DISSOLVE ORALLY).    [provider]  Polyethyl Glyc-Propyl Glyc PF (SYSTANE HYDRATION PF) 0.4-0.3 % SOLN Place 1 drop into both eyes 3 (three) times daily.    [provider]  polyethylene glycol (MIRALAX / GLYCOLAX) 17 g packet Take 17 g by mouth every 12 (twelve) hours as needed for mild constipation.    [provider]  sodium chloride (OCEAN) 0.65 % SOLN nasal spray Place 2 sprays into both nostrils 2 (two) times daily as needed for congestion.    [provider]  venlafaxine XR (EFFEXOR-XR) 75 MG 24 hr capsule Take 75 mg by mouth every morning.    [provider]  VRAYLAR 4.5 MG CAPS Take 4.5 mg by mouth every morning. 08/16/22   [provider]  Zinc Oxide (TRIPLE PASTE) 12.8 % ointment Apply topically 2 (two) times daily. Patient not taking: Reported on 10/26/2022 09/12/22   Lorin Glass, MD      Allergies    Meperidine and related and Cefazolin    Review of Systems   Review of Systems  Physical Exam Updated Vital Signs BP 133/69   Pulse 99   Temp 98.3 F (36.8 C) (Oral)   Resp 19   Ht 5\' 6"  (1.676 m)   Wt (!) 158.8 kg   SpO2 98%   BMI 56.49 kg/m  Physical Exam Vitals and nursing note reviewed.  Constitutional:      General: She is not in acute distress.    Appearance: She is well-developed. She is obese.  HENT:     Head:  Normocephalic and atraumatic.     Mouth/Throat:     Mouth: Mucous membranes are moist.  Eyes:     General: Vision grossly intact. Gaze aligned appropriately.     Extraocular Movements: Extraocular movements intact.     Conjunctiva/sclera: Conjunctivae normal.  Cardiovascular:     Rate and Rhythm: Normal rate and regular rhythm.     Pulses: Normal pulses.     Heart sounds: Normal heart sounds, S1 normal and S2 normal. No murmur heard.    No friction rub. No gallop.  Pulmonary:     Effort: Pulmonary effort is normal. No respiratory distress.     Breath sounds: Normal breath sounds.  Abdominal:     General: Bowel sounds are normal.     Palpations: Abdomen is soft.     Tenderness: There is no abdominal tenderness. There is no guarding or rebound.     Hernia: No hernia is present.  Musculoskeletal:        General: No swelling.     Cervical back: Full passive range of motion without pain, normal range of motion and neck supple. No spinous process tenderness or muscular tenderness. Normal range of motion.     Right lower leg: No edema.     Left lower leg: No edema.  Skin:    General: Skin is warm and dry.     Capillary Refill: Capillary refill takes less than 2 seconds.     Findings: No ecchymosis, erythema, rash or wound.  Neurological:     General: No focal deficit present.     Mental Status: She is alert and oriented to person, place, and time.     GCS: GCS eye subscore is 4. GCS verbal subscore is 5. GCS motor subscore is 6.     Cranial Nerves: Cranial nerves 2-12 are intact.     Sensory: Sensation is intact.     Motor: Motor function is intact.     Coordination: Coordination is intact.  Psychiatric:        Attention and Perception: Attention normal.        Mood and Affect: Mood normal.        Speech: Speech normal.        Behavior: Behavior normal.     ED Results / Procedures / Treatments   Labs (all labs ordered are listed, but only abnormal results are displayed) Labs  Reviewed  BASIC METABOLIC PANEL - Abnormal; Notable for the following components:      Result Value   Chloride 94 (*)    Glucose, Bld 140 (*)  BUN 23 (*)    All other components within normal limits  CBC  TROPONIN I (HIGH SENSITIVITY)  TROPONIN I (HIGH SENSITIVITY)    EKG EKG Interpretation Date/Time:  Friday April 06 2023 22:56:00 EDT Ventricular Rate:  89 PR Interval:  129 QRS Duration:  74 QT Interval:  352 QTC Calculation: 429 R Axis:   76  Text Interpretation: Sinus rhythm Atrial premature complex Confirmed by Gilda Crease [phone]) on 04/06/2023 10:59:46 PM  Radiology CT Angio Chest PE W and/or Wo Contrast  Result Date: 04/07/2023 CLINICAL DATA:  Pulmonary embolism (PE) suspected, high prob EXAM: CT ANGIOGRAPHY CHEST WITH CONTRAST TECHNIQUE: Multidetector CT imaging of the chest was performed using the standard protocol during bolus administration of intravenous contrast. Multiplanar CT image reconstructions and MIPs were obtained to evaluate the vascular anatomy. RADIATION DOSE REDUCTION: This exam was performed according to the departmental dose-optimization program which includes automated exposure control, adjustment of the mA and/or kV according to patient size and/or use of iterative reconstruction technique. CONTRAST:  75mL OMNIPAQUE IOHEXOL 350 MG/ML SOLN COMPARISON:  09/09/2022 FINDINGS: Cardiovascular: No filling defects in the pulmonary arteries to suggest pulmonary emboli. Heart is normal size. Aorta is normal caliber. Scattered coronary artery and aortic calcifications. Mediastinum/Nodes: No mediastinal, hilar, or axillary adenopathy. Tracheostomy tube tip in the midtrachea. Thyroid and esophagus unremarkable. Lungs/Pleura: Elevation of the right hemidiaphragm. Small chronic right pleural effusion. Chronic right lower lobe airspace disease. Findings are similar prior study. Left basilar opacity, less pronounced than prior study. Upper Abdomen: No acute  abnormality. Probable gallstones within the gallbladder. Musculoskeletal: Chest wall soft tissues are unremarkable. No acute bony abnormality. Review of the MIP images confirms the above findings. IMPRESSION: No evidence of pulmonary embolus. Small right pleural effusion with right lower lobe airspace opacities. Findings are similar to prior CT. Cannot exclude recurrent pneumonia. Left base atelectasis, decreased since prior study. Scattered coronary artery calcifications. Aortic Atherosclerosis (ICD10-I70.0). Electronically Signed   By: Charlett Nose M.D.   On: 04/07/2023 03:14   DG Chest 2 View  Result Date: 04/06/2023 CLINICAL DATA:  Chest pain.  History of CHF and COPD. EXAM: CHEST - 2 VIEW COMPARISON:  02/14/2023. FINDINGS: The heart is enlarged and the mediastinal contour is stable. Patchy airspace disease is noted in the lower lobes bilaterally with small bilateral pleural effusions, best seen on lateral view. A tracheostomy tube terminates 9.4 cm above the carina. No acute osseous abnormality IMPRESSION: 1. Patchy airspace disease in the lower lobes bilaterally, possible atelectasis, edema, or infiltrate. 2. Small bilateral pleural effusions. 3. Cardiomegaly. Electronically Signed   By: Thornell Sartorius M.D.   On: 04/06/2023 23:51    Procedures Procedures    Medications Ordered in ED Medications  fentaNYL (SUBLIMAZE) injection 100 mcg (100 mcg Intravenous Given 04/07/23 0204)  ondansetron (ZOFRAN) injection 4 mg (4 mg Intravenous Given 04/07/23 0203)  iohexol (OMNIPAQUE) 350 MG/ML injection 75 mL (75 mLs Intravenous Contrast Given 04/07/23 0303)    ED Course/ Medical Decision Making/ A&P                             Medical Decision Making Amount and/or Complexity of Data Reviewed Labs: ordered. Radiology: ordered.  Risk Prescription drug management.   Differential Diagnosis considered includes, but not limited to: STEMI; NSTEMI; myocarditis; pericarditis; pulmonary embolism; aortic  dissection; pneumothorax; pneumonia; gastritis; musculoskeletal pain  Presents to the emergency department with chest pain.  She describes a heaviness over  the left chest that came on at rest and has been persistent.  Patient does have a cough and history of COPD.  No significant wheezing on arrival.  EKG without ischemic changes.  Troponin negative x 2.  Chest x-ray suspicious for pneumonia.  Patient underwent CT angio to further evaluate.  No evidence of PE, does have small effusion and possible persistent pneumonia.  Patient in no respiratory distress.  Will be appropriate for return to [facility], continue treatment for possible pneumonia.        Final Clinical Impression(s) / ED Diagnoses Final diagnoses:  Pneumonia of right lower lobe due to infectious organism    Rx / DC Orders ED Discharge Orders     None         Andruw Battie, Canary Brim, MD 04/07/23 [phone]

## 2023-04-07 NOTE — ED Notes (Signed)
[facility] here to transport pt at this time.  

## 2023-04-17 DIAGNOSIS — J189 Pneumonia, unspecified organism: Secondary | ICD-10-CM | POA: Diagnosis not present

## 2023-04-17 DIAGNOSIS — G4733 Obstructive sleep apnea (adult) (pediatric): Secondary | ICD-10-CM | POA: Diagnosis not present

## 2023-04-17 DIAGNOSIS — F319 Bipolar disorder, unspecified: Secondary | ICD-10-CM

## 2023-04-17 DIAGNOSIS — J9621 Acute and chronic respiratory failure with hypoxia: Secondary | ICD-10-CM | POA: Diagnosis not present

## 2023-04-17 DIAGNOSIS — E662 Morbid (severe) obesity with alveolar hypoventilation: Secondary | ICD-10-CM

## 2023-04-17 DIAGNOSIS — E119 Type 2 diabetes mellitus without complications: Secondary | ICD-10-CM | POA: Diagnosis not present

## 2023-04-25 ENCOUNTER — Other Ambulatory Visit: Payer: Self-pay | Admitting: Internal Medicine

## 2023-04-28 DIAGNOSIS — J9690 Respiratory failure, unspecified, unspecified whether with hypoxia or hypercapnia: Secondary | ICD-10-CM

## 2023-05-01 DIAGNOSIS — J9621 Acute and chronic respiratory failure with hypoxia: Secondary | ICD-10-CM | POA: Diagnosis not present

## 2023-05-01 DIAGNOSIS — G4733 Obstructive sleep apnea (adult) (pediatric): Secondary | ICD-10-CM | POA: Diagnosis not present

## 2023-05-01 DIAGNOSIS — E119 Type 2 diabetes mellitus without complications: Secondary | ICD-10-CM | POA: Diagnosis not present

## 2023-05-01 DIAGNOSIS — J189 Pneumonia, unspecified organism: Secondary | ICD-10-CM | POA: Diagnosis not present

## 2023-05-01 DIAGNOSIS — E662 Morbid (severe) obesity with alveolar hypoventilation: Secondary | ICD-10-CM

## 2023-05-01 DIAGNOSIS — F319 Bipolar disorder, unspecified: Secondary | ICD-10-CM

## 2023-05-15 DIAGNOSIS — J189 Pneumonia, unspecified organism: Secondary | ICD-10-CM | POA: Diagnosis not present

## 2023-05-15 DIAGNOSIS — E119 Type 2 diabetes mellitus without complications: Secondary | ICD-10-CM | POA: Diagnosis not present

## 2023-05-15 DIAGNOSIS — F319 Bipolar disorder, unspecified: Secondary | ICD-10-CM

## 2023-05-15 DIAGNOSIS — E662 Morbid (severe) obesity with alveolar hypoventilation: Secondary | ICD-10-CM

## 2023-05-15 DIAGNOSIS — J9621 Acute and chronic respiratory failure with hypoxia: Secondary | ICD-10-CM | POA: Diagnosis not present

## 2023-05-15 DIAGNOSIS — G4733 Obstructive sleep apnea (adult) (pediatric): Secondary | ICD-10-CM | POA: Diagnosis not present

## 2023-05-29 ENCOUNTER — Other Ambulatory Visit: Payer: Self-pay | Admitting: Internal Medicine

## 2023-05-29 DIAGNOSIS — G4733 Obstructive sleep apnea (adult) (pediatric): Secondary | ICD-10-CM

## 2023-05-29 DIAGNOSIS — F319 Bipolar disorder, unspecified: Secondary | ICD-10-CM

## 2023-05-29 DIAGNOSIS — E119 Type 2 diabetes mellitus without complications: Secondary | ICD-10-CM

## 2023-05-29 DIAGNOSIS — J189 Pneumonia, unspecified organism: Secondary | ICD-10-CM

## 2023-05-29 DIAGNOSIS — J9621 Acute and chronic respiratory failure with hypoxia: Secondary | ICD-10-CM

## 2023-05-29 DIAGNOSIS — E662 Morbid (severe) obesity with alveolar hypoventilation: Secondary | ICD-10-CM

## 2023-06-12 DIAGNOSIS — E662 Morbid (severe) obesity with alveolar hypoventilation: Secondary | ICD-10-CM

## 2023-06-12 DIAGNOSIS — G4733 Obstructive sleep apnea (adult) (pediatric): Secondary | ICD-10-CM | POA: Diagnosis not present

## 2023-06-12 DIAGNOSIS — J189 Pneumonia, unspecified organism: Secondary | ICD-10-CM | POA: Diagnosis not present

## 2023-06-12 DIAGNOSIS — E119 Type 2 diabetes mellitus without complications: Secondary | ICD-10-CM | POA: Diagnosis not present

## 2023-06-12 DIAGNOSIS — F319 Bipolar disorder, unspecified: Secondary | ICD-10-CM

## 2023-06-12 DIAGNOSIS — J9621 Acute and chronic respiratory failure with hypoxia: Secondary | ICD-10-CM | POA: Diagnosis not present

## 2023-06-26 DIAGNOSIS — E119 Type 2 diabetes mellitus without complications: Secondary | ICD-10-CM | POA: Diagnosis not present

## 2023-06-26 DIAGNOSIS — G4733 Obstructive sleep apnea (adult) (pediatric): Secondary | ICD-10-CM | POA: Diagnosis not present

## 2023-06-26 DIAGNOSIS — J9621 Acute and chronic respiratory failure with hypoxia: Secondary | ICD-10-CM | POA: Diagnosis not present

## 2023-06-26 DIAGNOSIS — F319 Bipolar disorder, unspecified: Secondary | ICD-10-CM

## 2023-06-26 DIAGNOSIS — E662 Morbid (severe) obesity with alveolar hypoventilation: Secondary | ICD-10-CM

## 2023-06-26 DIAGNOSIS — J189 Pneumonia, unspecified organism: Secondary | ICD-10-CM | POA: Diagnosis not present

## 2023-07-03 ENCOUNTER — Other Ambulatory Visit: Payer: Self-pay | Admitting: Internal Medicine

## 2023-07-10 DIAGNOSIS — G4733 Obstructive sleep apnea (adult) (pediatric): Secondary | ICD-10-CM | POA: Diagnosis not present

## 2023-07-10 DIAGNOSIS — F319 Bipolar disorder, unspecified: Secondary | ICD-10-CM

## 2023-07-10 DIAGNOSIS — J9621 Acute and chronic respiratory failure with hypoxia: Secondary | ICD-10-CM | POA: Diagnosis not present

## 2023-07-10 DIAGNOSIS — E119 Type 2 diabetes mellitus without complications: Secondary | ICD-10-CM | POA: Diagnosis not present

## 2023-07-10 DIAGNOSIS — E662 Morbid (severe) obesity with alveolar hypoventilation: Secondary | ICD-10-CM

## 2023-07-10 DIAGNOSIS — J189 Pneumonia, unspecified organism: Secondary | ICD-10-CM | POA: Diagnosis not present

## 2023-07-31 ENCOUNTER — Other Ambulatory Visit: Payer: Self-pay | Admitting: Internal Medicine

## 2023-08-01 ENCOUNTER — Other Ambulatory Visit: Payer: Self-pay | Admitting: Internal Medicine

## 2023-08-05 DIAGNOSIS — J9690 Respiratory failure, unspecified, unspecified whether with hypoxia or hypercapnia: Secondary | ICD-10-CM | POA: Diagnosis not present

## 2023-08-07 DIAGNOSIS — G4733 Obstructive sleep apnea (adult) (pediatric): Secondary | ICD-10-CM | POA: Diagnosis not present

## 2023-08-07 DIAGNOSIS — F319 Bipolar disorder, unspecified: Secondary | ICD-10-CM

## 2023-08-07 DIAGNOSIS — J9621 Acute and chronic respiratory failure with hypoxia: Secondary | ICD-10-CM | POA: Diagnosis not present

## 2023-08-07 DIAGNOSIS — J189 Pneumonia, unspecified organism: Secondary | ICD-10-CM | POA: Diagnosis not present

## 2023-08-07 DIAGNOSIS — E662 Morbid (severe) obesity with alveolar hypoventilation: Secondary | ICD-10-CM

## 2023-08-07 DIAGNOSIS — E119 Type 2 diabetes mellitus without complications: Secondary | ICD-10-CM | POA: Diagnosis not present

## 2023-08-21 DIAGNOSIS — J9621 Acute and chronic respiratory failure with hypoxia: Secondary | ICD-10-CM

## 2023-08-21 DIAGNOSIS — G4733 Obstructive sleep apnea (adult) (pediatric): Secondary | ICD-10-CM

## 2023-08-21 DIAGNOSIS — E119 Type 2 diabetes mellitus without complications: Secondary | ICD-10-CM

## 2023-08-21 DIAGNOSIS — F319 Bipolar disorder, unspecified: Secondary | ICD-10-CM

## 2023-08-21 DIAGNOSIS — E662 Morbid (severe) obesity with alveolar hypoventilation: Secondary | ICD-10-CM

## 2023-08-21 DIAGNOSIS — J189 Pneumonia, unspecified organism: Secondary | ICD-10-CM

## 2023-09-04 DIAGNOSIS — E119 Type 2 diabetes mellitus without complications: Secondary | ICD-10-CM | POA: Diagnosis not present

## 2023-09-04 DIAGNOSIS — E662 Morbid (severe) obesity with alveolar hypoventilation: Secondary | ICD-10-CM

## 2023-09-04 DIAGNOSIS — G4733 Obstructive sleep apnea (adult) (pediatric): Secondary | ICD-10-CM | POA: Diagnosis not present

## 2023-09-04 DIAGNOSIS — J9621 Acute and chronic respiratory failure with hypoxia: Secondary | ICD-10-CM | POA: Diagnosis not present

## 2023-09-04 DIAGNOSIS — J189 Pneumonia, unspecified organism: Secondary | ICD-10-CM | POA: Diagnosis not present

## 2023-09-04 DIAGNOSIS — F319 Bipolar disorder, unspecified: Secondary | ICD-10-CM

## 2023-09-11 ENCOUNTER — Other Ambulatory Visit: Payer: Self-pay | Admitting: Internal Medicine

## 2023-09-18 DIAGNOSIS — J9621 Acute and chronic respiratory failure with hypoxia: Secondary | ICD-10-CM

## 2023-09-18 DIAGNOSIS — G4733 Obstructive sleep apnea (adult) (pediatric): Secondary | ICD-10-CM

## 2023-09-18 DIAGNOSIS — E662 Morbid (severe) obesity with alveolar hypoventilation: Secondary | ICD-10-CM

## 2023-09-18 DIAGNOSIS — F319 Bipolar disorder, unspecified: Secondary | ICD-10-CM

## 2023-09-18 DIAGNOSIS — E119 Type 2 diabetes mellitus without complications: Secondary | ICD-10-CM

## 2023-10-02 DIAGNOSIS — E119 Type 2 diabetes mellitus without complications: Secondary | ICD-10-CM | POA: Diagnosis not present

## 2023-10-02 DIAGNOSIS — E662 Morbid (severe) obesity with alveolar hypoventilation: Secondary | ICD-10-CM

## 2023-10-02 DIAGNOSIS — F319 Bipolar disorder, unspecified: Secondary | ICD-10-CM | POA: Diagnosis not present

## 2023-10-02 DIAGNOSIS — G4733 Obstructive sleep apnea (adult) (pediatric): Secondary | ICD-10-CM | POA: Diagnosis not present

## 2023-10-02 DIAGNOSIS — J9621 Acute and chronic respiratory failure with hypoxia: Secondary | ICD-10-CM | POA: Diagnosis not present

## 2023-10-11 ENCOUNTER — Other Ambulatory Visit: Payer: Self-pay | Admitting: Internal Medicine

## 2023-10-16 DIAGNOSIS — F319 Bipolar disorder, unspecified: Secondary | ICD-10-CM | POA: Diagnosis not present

## 2023-10-16 DIAGNOSIS — G4733 Obstructive sleep apnea (adult) (pediatric): Secondary | ICD-10-CM | POA: Diagnosis not present

## 2023-10-16 DIAGNOSIS — J9621 Acute and chronic respiratory failure with hypoxia: Secondary | ICD-10-CM | POA: Diagnosis not present

## 2023-10-16 DIAGNOSIS — E119 Type 2 diabetes mellitus without complications: Secondary | ICD-10-CM | POA: Diagnosis not present

## 2023-10-16 DIAGNOSIS — E662 Morbid (severe) obesity with alveolar hypoventilation: Secondary | ICD-10-CM

## 2023-10-30 DIAGNOSIS — F319 Bipolar disorder, unspecified: Secondary | ICD-10-CM

## 2023-10-30 DIAGNOSIS — E119 Type 2 diabetes mellitus without complications: Secondary | ICD-10-CM | POA: Diagnosis not present

## 2023-10-30 DIAGNOSIS — J9621 Acute and chronic respiratory failure with hypoxia: Secondary | ICD-10-CM | POA: Diagnosis not present

## 2023-10-30 DIAGNOSIS — E662 Morbid (severe) obesity with alveolar hypoventilation: Secondary | ICD-10-CM

## 2023-10-30 DIAGNOSIS — G4733 Obstructive sleep apnea (adult) (pediatric): Secondary | ICD-10-CM | POA: Diagnosis not present

## 2023-11-13 DIAGNOSIS — G4733 Obstructive sleep apnea (adult) (pediatric): Secondary | ICD-10-CM | POA: Diagnosis not present

## 2023-11-13 DIAGNOSIS — E119 Type 2 diabetes mellitus without complications: Secondary | ICD-10-CM | POA: Diagnosis not present

## 2023-11-13 DIAGNOSIS — J9621 Acute and chronic respiratory failure with hypoxia: Secondary | ICD-10-CM | POA: Diagnosis not present

## 2023-11-13 DIAGNOSIS — E662 Morbid (severe) obesity with alveolar hypoventilation: Secondary | ICD-10-CM

## 2023-11-13 DIAGNOSIS — F319 Bipolar disorder, unspecified: Secondary | ICD-10-CM | POA: Diagnosis not present

## 2023-11-20 ENCOUNTER — Other Ambulatory Visit: Payer: Self-pay | Admitting: Internal Medicine

## 2023-11-27 DIAGNOSIS — E662 Morbid (severe) obesity with alveolar hypoventilation: Secondary | ICD-10-CM

## 2023-11-27 DIAGNOSIS — F319 Bipolar disorder, unspecified: Secondary | ICD-10-CM | POA: Diagnosis not present

## 2023-11-27 DIAGNOSIS — J9621 Acute and chronic respiratory failure with hypoxia: Secondary | ICD-10-CM | POA: Diagnosis not present

## 2023-11-27 DIAGNOSIS — E119 Type 2 diabetes mellitus without complications: Secondary | ICD-10-CM | POA: Diagnosis not present

## 2023-11-27 DIAGNOSIS — G4733 Obstructive sleep apnea (adult) (pediatric): Secondary | ICD-10-CM | POA: Diagnosis not present

## 2023-12-11 DIAGNOSIS — G4733 Obstructive sleep apnea (adult) (pediatric): Secondary | ICD-10-CM | POA: Diagnosis not present

## 2023-12-11 DIAGNOSIS — E119 Type 2 diabetes mellitus without complications: Secondary | ICD-10-CM | POA: Diagnosis not present

## 2023-12-11 DIAGNOSIS — E662 Morbid (severe) obesity with alveolar hypoventilation: Secondary | ICD-10-CM

## 2023-12-11 DIAGNOSIS — F319 Bipolar disorder, unspecified: Secondary | ICD-10-CM | POA: Diagnosis not present

## 2023-12-11 DIAGNOSIS — J9621 Acute and chronic respiratory failure with hypoxia: Secondary | ICD-10-CM | POA: Diagnosis not present

## 2023-12-19 ENCOUNTER — Other Ambulatory Visit: Payer: Self-pay | Admitting: Internal Medicine

## 2023-12-19 DIAGNOSIS — G4733 Obstructive sleep apnea (adult) (pediatric): Secondary | ICD-10-CM | POA: Diagnosis not present

## 2023-12-19 DIAGNOSIS — J9621 Acute and chronic respiratory failure with hypoxia: Secondary | ICD-10-CM | POA: Diagnosis not present

## 2023-12-19 DIAGNOSIS — E119 Type 2 diabetes mellitus without complications: Secondary | ICD-10-CM | POA: Diagnosis not present

## 2023-12-19 DIAGNOSIS — F319 Bipolar disorder, unspecified: Secondary | ICD-10-CM | POA: Diagnosis not present

## 2023-12-19 DIAGNOSIS — E662 Morbid (severe) obesity with alveolar hypoventilation: Secondary | ICD-10-CM

## 2023-12-20 ENCOUNTER — Other Ambulatory Visit: Payer: Self-pay | Admitting: Internal Medicine

## 2023-12-25 DIAGNOSIS — F319 Bipolar disorder, unspecified: Secondary | ICD-10-CM | POA: Diagnosis not present

## 2023-12-25 DIAGNOSIS — E119 Type 2 diabetes mellitus without complications: Secondary | ICD-10-CM | POA: Diagnosis not present

## 2023-12-25 DIAGNOSIS — J9621 Acute and chronic respiratory failure with hypoxia: Secondary | ICD-10-CM | POA: Diagnosis not present

## 2023-12-25 DIAGNOSIS — E662 Morbid (severe) obesity with alveolar hypoventilation: Secondary | ICD-10-CM

## 2023-12-25 DIAGNOSIS — G4733 Obstructive sleep apnea (adult) (pediatric): Secondary | ICD-10-CM | POA: Diagnosis not present

## 2024-01-01 DIAGNOSIS — J9621 Acute and chronic respiratory failure with hypoxia: Secondary | ICD-10-CM | POA: Diagnosis not present

## 2024-01-01 DIAGNOSIS — G4733 Obstructive sleep apnea (adult) (pediatric): Secondary | ICD-10-CM | POA: Diagnosis not present

## 2024-01-01 DIAGNOSIS — E662 Morbid (severe) obesity with alveolar hypoventilation: Secondary | ICD-10-CM

## 2024-01-01 DIAGNOSIS — F319 Bipolar disorder, unspecified: Secondary | ICD-10-CM | POA: Diagnosis not present

## 2024-01-01 DIAGNOSIS — E119 Type 2 diabetes mellitus without complications: Secondary | ICD-10-CM | POA: Diagnosis not present

## 2024-01-08 DIAGNOSIS — E119 Type 2 diabetes mellitus without complications: Secondary | ICD-10-CM | POA: Diagnosis not present

## 2024-01-08 DIAGNOSIS — F319 Bipolar disorder, unspecified: Secondary | ICD-10-CM

## 2024-01-08 DIAGNOSIS — N390 Urinary tract infection, site not specified: Secondary | ICD-10-CM | POA: Diagnosis not present

## 2024-01-08 DIAGNOSIS — J9621 Acute and chronic respiratory failure with hypoxia: Secondary | ICD-10-CM | POA: Diagnosis not present

## 2024-01-08 DIAGNOSIS — E662 Morbid (severe) obesity with alveolar hypoventilation: Secondary | ICD-10-CM

## 2024-01-08 DIAGNOSIS — G4733 Obstructive sleep apnea (adult) (pediatric): Secondary | ICD-10-CM | POA: Diagnosis not present

## 2024-01-15 DIAGNOSIS — E119 Type 2 diabetes mellitus without complications: Secondary | ICD-10-CM | POA: Diagnosis not present

## 2024-01-15 DIAGNOSIS — N390 Urinary tract infection, site not specified: Secondary | ICD-10-CM | POA: Diagnosis not present

## 2024-01-15 DIAGNOSIS — G4733 Obstructive sleep apnea (adult) (pediatric): Secondary | ICD-10-CM | POA: Diagnosis not present

## 2024-01-15 DIAGNOSIS — J9621 Acute and chronic respiratory failure with hypoxia: Secondary | ICD-10-CM | POA: Diagnosis not present

## 2024-01-15 DIAGNOSIS — E662 Morbid (severe) obesity with alveolar hypoventilation: Secondary | ICD-10-CM

## 2024-01-22 DIAGNOSIS — E119 Type 2 diabetes mellitus without complications: Secondary | ICD-10-CM | POA: Diagnosis not present

## 2024-01-22 DIAGNOSIS — R319 Hematuria, unspecified: Secondary | ICD-10-CM | POA: Diagnosis not present

## 2024-01-22 DIAGNOSIS — J9621 Acute and chronic respiratory failure with hypoxia: Secondary | ICD-10-CM | POA: Diagnosis not present

## 2024-01-22 DIAGNOSIS — E662 Morbid (severe) obesity with alveolar hypoventilation: Secondary | ICD-10-CM

## 2024-01-22 DIAGNOSIS — G4733 Obstructive sleep apnea (adult) (pediatric): Secondary | ICD-10-CM | POA: Diagnosis not present

## 2024-01-29 ENCOUNTER — Other Ambulatory Visit: Payer: Self-pay | Admitting: Internal Medicine

## 2024-01-29 DIAGNOSIS — E662 Morbid (severe) obesity with alveolar hypoventilation: Secondary | ICD-10-CM

## 2024-01-29 DIAGNOSIS — J9621 Acute and chronic respiratory failure with hypoxia: Secondary | ICD-10-CM | POA: Diagnosis not present

## 2024-01-29 DIAGNOSIS — E119 Type 2 diabetes mellitus without complications: Secondary | ICD-10-CM | POA: Diagnosis not present

## 2024-01-29 DIAGNOSIS — G4733 Obstructive sleep apnea (adult) (pediatric): Secondary | ICD-10-CM | POA: Diagnosis not present

## 2024-01-29 DIAGNOSIS — F319 Bipolar disorder, unspecified: Secondary | ICD-10-CM | POA: Diagnosis not present

## 2024-02-05 DIAGNOSIS — G4733 Obstructive sleep apnea (adult) (pediatric): Secondary | ICD-10-CM | POA: Diagnosis not present

## 2024-02-05 DIAGNOSIS — E662 Morbid (severe) obesity with alveolar hypoventilation: Secondary | ICD-10-CM

## 2024-02-05 DIAGNOSIS — F319 Bipolar disorder, unspecified: Secondary | ICD-10-CM | POA: Diagnosis not present

## 2024-02-05 DIAGNOSIS — E119 Type 2 diabetes mellitus without complications: Secondary | ICD-10-CM | POA: Diagnosis not present

## 2024-02-05 DIAGNOSIS — J9621 Acute and chronic respiratory failure with hypoxia: Secondary | ICD-10-CM | POA: Diagnosis not present

## 2024-02-11 DIAGNOSIS — E662 Morbid (severe) obesity with alveolar hypoventilation: Secondary | ICD-10-CM

## 2024-02-11 DIAGNOSIS — F319 Bipolar disorder, unspecified: Secondary | ICD-10-CM | POA: Diagnosis not present

## 2024-02-11 DIAGNOSIS — G4733 Obstructive sleep apnea (adult) (pediatric): Secondary | ICD-10-CM | POA: Diagnosis not present

## 2024-02-11 DIAGNOSIS — E119 Type 2 diabetes mellitus without complications: Secondary | ICD-10-CM | POA: Diagnosis not present

## 2024-02-11 DIAGNOSIS — J9621 Acute and chronic respiratory failure with hypoxia: Secondary | ICD-10-CM | POA: Diagnosis not present

## 2024-02-15 DIAGNOSIS — R079 Chest pain, unspecified: Secondary | ICD-10-CM

## 2024-02-19 DIAGNOSIS — G4733 Obstructive sleep apnea (adult) (pediatric): Secondary | ICD-10-CM

## 2024-02-19 DIAGNOSIS — F319 Bipolar disorder, unspecified: Secondary | ICD-10-CM

## 2024-02-19 DIAGNOSIS — E662 Morbid (severe) obesity with alveolar hypoventilation: Secondary | ICD-10-CM

## 2024-02-19 DIAGNOSIS — E119 Type 2 diabetes mellitus without complications: Secondary | ICD-10-CM

## 2024-02-19 DIAGNOSIS — J9621 Acute and chronic respiratory failure with hypoxia: Secondary | ICD-10-CM

## 2024-02-26 DIAGNOSIS — E119 Type 2 diabetes mellitus without complications: Secondary | ICD-10-CM | POA: Diagnosis not present

## 2024-02-26 DIAGNOSIS — G4733 Obstructive sleep apnea (adult) (pediatric): Secondary | ICD-10-CM | POA: Diagnosis not present

## 2024-02-26 DIAGNOSIS — J9621 Acute and chronic respiratory failure with hypoxia: Secondary | ICD-10-CM | POA: Diagnosis not present

## 2024-02-26 DIAGNOSIS — F319 Bipolar disorder, unspecified: Secondary | ICD-10-CM | POA: Diagnosis not present

## 2024-02-26 DIAGNOSIS — E662 Morbid (severe) obesity with alveolar hypoventilation: Secondary | ICD-10-CM

## 2024-02-27 ENCOUNTER — Other Ambulatory Visit: Payer: Self-pay | Admitting: Internal Medicine

## 2024-03-11 DIAGNOSIS — E662 Morbid (severe) obesity with alveolar hypoventilation: Secondary | ICD-10-CM

## 2024-03-11 DIAGNOSIS — G4733 Obstructive sleep apnea (adult) (pediatric): Secondary | ICD-10-CM | POA: Diagnosis not present

## 2024-03-11 DIAGNOSIS — J9621 Acute and chronic respiratory failure with hypoxia: Secondary | ICD-10-CM | POA: Diagnosis not present

## 2024-03-11 DIAGNOSIS — F319 Bipolar disorder, unspecified: Secondary | ICD-10-CM | POA: Diagnosis not present

## 2024-03-11 DIAGNOSIS — E119 Type 2 diabetes mellitus without complications: Secondary | ICD-10-CM | POA: Diagnosis not present

## 2024-03-18 DIAGNOSIS — E662 Morbid (severe) obesity with alveolar hypoventilation: Secondary | ICD-10-CM

## 2024-03-18 DIAGNOSIS — E119 Type 2 diabetes mellitus without complications: Secondary | ICD-10-CM | POA: Diagnosis not present

## 2024-03-18 DIAGNOSIS — F319 Bipolar disorder, unspecified: Secondary | ICD-10-CM | POA: Diagnosis not present

## 2024-03-18 DIAGNOSIS — G4733 Obstructive sleep apnea (adult) (pediatric): Secondary | ICD-10-CM | POA: Diagnosis not present

## 2024-03-18 DIAGNOSIS — J9621 Acute and chronic respiratory failure with hypoxia: Secondary | ICD-10-CM | POA: Diagnosis not present

## 2024-03-25 ENCOUNTER — Other Ambulatory Visit: Payer: Self-pay | Admitting: Internal Medicine

## 2024-03-25 DIAGNOSIS — J9621 Acute and chronic respiratory failure with hypoxia: Secondary | ICD-10-CM | POA: Diagnosis not present

## 2024-03-25 DIAGNOSIS — E662 Morbid (severe) obesity with alveolar hypoventilation: Secondary | ICD-10-CM

## 2024-03-25 DIAGNOSIS — F319 Bipolar disorder, unspecified: Secondary | ICD-10-CM | POA: Diagnosis not present

## 2024-03-25 DIAGNOSIS — E119 Type 2 diabetes mellitus without complications: Secondary | ICD-10-CM | POA: Diagnosis not present

## 2024-03-25 DIAGNOSIS — G4733 Obstructive sleep apnea (adult) (pediatric): Secondary | ICD-10-CM | POA: Diagnosis not present

## 2024-04-01 DIAGNOSIS — E662 Morbid (severe) obesity with alveolar hypoventilation: Secondary | ICD-10-CM

## 2024-04-01 DIAGNOSIS — G4733 Obstructive sleep apnea (adult) (pediatric): Secondary | ICD-10-CM | POA: Diagnosis not present

## 2024-04-01 DIAGNOSIS — E119 Type 2 diabetes mellitus without complications: Secondary | ICD-10-CM | POA: Diagnosis not present

## 2024-04-01 DIAGNOSIS — F319 Bipolar disorder, unspecified: Secondary | ICD-10-CM | POA: Diagnosis not present

## 2024-04-01 DIAGNOSIS — J9621 Acute and chronic respiratory failure with hypoxia: Secondary | ICD-10-CM | POA: Diagnosis not present

## 2024-04-08 DIAGNOSIS — J9621 Acute and chronic respiratory failure with hypoxia: Secondary | ICD-10-CM | POA: Diagnosis not present

## 2024-04-08 DIAGNOSIS — F319 Bipolar disorder, unspecified: Secondary | ICD-10-CM | POA: Diagnosis not present

## 2024-04-08 DIAGNOSIS — E119 Type 2 diabetes mellitus without complications: Secondary | ICD-10-CM | POA: Diagnosis not present

## 2024-04-08 DIAGNOSIS — G4733 Obstructive sleep apnea (adult) (pediatric): Secondary | ICD-10-CM | POA: Diagnosis not present

## 2024-04-08 DIAGNOSIS — E662 Morbid (severe) obesity with alveolar hypoventilation: Secondary | ICD-10-CM

## 2024-04-15 DIAGNOSIS — J9621 Acute and chronic respiratory failure with hypoxia: Secondary | ICD-10-CM | POA: Diagnosis not present

## 2024-04-15 DIAGNOSIS — E662 Morbid (severe) obesity with alveolar hypoventilation: Secondary | ICD-10-CM

## 2024-04-15 DIAGNOSIS — E119 Type 2 diabetes mellitus without complications: Secondary | ICD-10-CM | POA: Diagnosis not present

## 2024-04-15 DIAGNOSIS — G4733 Obstructive sleep apnea (adult) (pediatric): Secondary | ICD-10-CM | POA: Diagnosis not present

## 2024-04-15 DIAGNOSIS — F319 Bipolar disorder, unspecified: Secondary | ICD-10-CM | POA: Diagnosis not present

## 2024-04-22 ENCOUNTER — Other Ambulatory Visit: Payer: Self-pay | Admitting: Internal Medicine

## 2024-04-22 DIAGNOSIS — F319 Bipolar disorder, unspecified: Secondary | ICD-10-CM | POA: Diagnosis not present

## 2024-04-22 DIAGNOSIS — G4733 Obstructive sleep apnea (adult) (pediatric): Secondary | ICD-10-CM | POA: Diagnosis not present

## 2024-04-22 DIAGNOSIS — E119 Type 2 diabetes mellitus without complications: Secondary | ICD-10-CM | POA: Diagnosis not present

## 2024-04-22 DIAGNOSIS — J9621 Acute and chronic respiratory failure with hypoxia: Secondary | ICD-10-CM | POA: Diagnosis not present

## 2024-04-22 DIAGNOSIS — E662 Morbid (severe) obesity with alveolar hypoventilation: Secondary | ICD-10-CM

## 2024-04-29 DIAGNOSIS — F319 Bipolar disorder, unspecified: Secondary | ICD-10-CM | POA: Diagnosis not present

## 2024-04-29 DIAGNOSIS — E662 Morbid (severe) obesity with alveolar hypoventilation: Secondary | ICD-10-CM

## 2024-04-29 DIAGNOSIS — J9621 Acute and chronic respiratory failure with hypoxia: Secondary | ICD-10-CM | POA: Diagnosis not present

## 2024-04-29 DIAGNOSIS — E119 Type 2 diabetes mellitus without complications: Secondary | ICD-10-CM | POA: Diagnosis not present

## 2024-04-29 DIAGNOSIS — G4733 Obstructive sleep apnea (adult) (pediatric): Secondary | ICD-10-CM | POA: Diagnosis not present

## 2024-05-06 DIAGNOSIS — F319 Bipolar disorder, unspecified: Secondary | ICD-10-CM | POA: Diagnosis not present

## 2024-05-06 DIAGNOSIS — E119 Type 2 diabetes mellitus without complications: Secondary | ICD-10-CM | POA: Diagnosis not present

## 2024-05-06 DIAGNOSIS — E662 Morbid (severe) obesity with alveolar hypoventilation: Secondary | ICD-10-CM

## 2024-05-06 DIAGNOSIS — G4733 Obstructive sleep apnea (adult) (pediatric): Secondary | ICD-10-CM | POA: Diagnosis not present

## 2024-05-06 DIAGNOSIS — J9621 Acute and chronic respiratory failure with hypoxia: Secondary | ICD-10-CM | POA: Diagnosis not present

## 2024-05-13 DIAGNOSIS — J9621 Acute and chronic respiratory failure with hypoxia: Secondary | ICD-10-CM | POA: Diagnosis not present

## 2024-05-13 DIAGNOSIS — F319 Bipolar disorder, unspecified: Secondary | ICD-10-CM | POA: Diagnosis not present

## 2024-05-13 DIAGNOSIS — G4733 Obstructive sleep apnea (adult) (pediatric): Secondary | ICD-10-CM | POA: Diagnosis not present

## 2024-05-13 DIAGNOSIS — E662 Morbid (severe) obesity with alveolar hypoventilation: Secondary | ICD-10-CM

## 2024-05-13 DIAGNOSIS — E119 Type 2 diabetes mellitus without complications: Secondary | ICD-10-CM | POA: Diagnosis not present

## 2024-05-20 DIAGNOSIS — E662 Morbid (severe) obesity with alveolar hypoventilation: Secondary | ICD-10-CM

## 2024-05-20 DIAGNOSIS — G4733 Obstructive sleep apnea (adult) (pediatric): Secondary | ICD-10-CM | POA: Diagnosis not present

## 2024-05-20 DIAGNOSIS — F319 Bipolar disorder, unspecified: Secondary | ICD-10-CM | POA: Diagnosis not present

## 2024-05-20 DIAGNOSIS — E119 Type 2 diabetes mellitus without complications: Secondary | ICD-10-CM | POA: Diagnosis not present

## 2024-05-20 DIAGNOSIS — J9621 Acute and chronic respiratory failure with hypoxia: Secondary | ICD-10-CM | POA: Diagnosis not present

## 2024-05-26 ENCOUNTER — Other Ambulatory Visit: Payer: Self-pay

## 2024-05-26 ENCOUNTER — Emergency Department (HOSPITAL_COMMUNITY)
Admission: EM | Admit: 2024-05-26 | Discharge: 2024-05-26 | Disposition: A | Discharge status: Home / Self Care | Attending: Emergency Medicine | Admitting: Emergency Medicine

## 2024-05-26 ENCOUNTER — Encounter (HOSPITAL_COMMUNITY): Payer: Self-pay

## 2024-05-26 DIAGNOSIS — I10 Essential (primary) hypertension: Secondary | ICD-10-CM | POA: Diagnosis not present

## 2024-05-26 DIAGNOSIS — G4733 Obstructive sleep apnea (adult) (pediatric): Secondary | ICD-10-CM | POA: Insufficient documentation

## 2024-05-26 DIAGNOSIS — J9611 Chronic respiratory failure with hypoxia: Secondary | ICD-10-CM | POA: Diagnosis not present

## 2024-05-26 DIAGNOSIS — Z930 Tracheostomy status: Secondary | ICD-10-CM | POA: Insufficient documentation

## 2024-05-26 DIAGNOSIS — R21 Rash and other nonspecific skin eruption: Secondary | ICD-10-CM | POA: Insufficient documentation

## 2024-05-26 DIAGNOSIS — Z6841 Body Mass Index (BMI) 40.0 and over, adult: Secondary | ICD-10-CM | POA: Insufficient documentation

## 2024-05-26 DIAGNOSIS — F319 Bipolar disorder, unspecified: Secondary | ICD-10-CM | POA: Diagnosis not present

## 2024-05-26 DIAGNOSIS — Z79899 Other long term (current) drug therapy: Secondary | ICD-10-CM | POA: Diagnosis not present

## 2024-05-26 DIAGNOSIS — J9612 Chronic respiratory failure with hypercapnia: Secondary | ICD-10-CM | POA: Diagnosis not present

## 2024-05-26 DIAGNOSIS — E119 Type 2 diabetes mellitus without complications: Secondary | ICD-10-CM | POA: Insufficient documentation

## 2024-05-26 DIAGNOSIS — F0390 Unspecified dementia without behavioral disturbance: Secondary | ICD-10-CM | POA: Diagnosis not present

## 2024-05-26 DIAGNOSIS — Z7982 Long term (current) use of aspirin: Secondary | ICD-10-CM | POA: Diagnosis not present

## 2024-05-26 LAB — BASIC METABOLIC PANEL WITH GFR
Anion gap: 13 (ref 5–15)
BUN: 33 mg/dL — ABNORMAL HIGH (ref 6–20)
CO2: 28 mmol/L (ref 22–32)
Calcium: 9.1 mg/dL (ref 8.9–10.3)
Chloride: 96 mmol/L — ABNORMAL LOW (ref 98–111)
Creatinine, Ser: 0.92 mg/dL (ref 0.44–1.00)
GFR, Estimated: >60 mL/min (ref >60)
Glucose, Bld: 173 mg/dL — ABNORMAL HIGH (ref 70–99)
Potassium: 3.5 mmol/L (ref 3.5–5.1)
Sodium: 137 mmol/L (ref 135–145)

## 2024-05-26 LAB — CBC
HCT: 39.2 % (ref 36.0–46.0)
Hemoglobin: 11.5 g/dL — ABNORMAL LOW (ref 12.0–15.0)
MCH: 27.4 pg (ref 26.0–34.0)
MCHC: 29.3 g/dL — ABNORMAL LOW (ref 30.0–36.0)
MCV: 93.3 fL (ref 80.0–100.0)
Platelets: 284 K/uL (ref 150–400)
RBC: 4.2 MIL/uL (ref 3.87–5.11)
RDW: 15.8 % — ABNORMAL HIGH (ref 11.5–15.5)
WBC: 7.9 K/uL (ref 4.0–10.5)
nRBC: 0.3 % — ABNORMAL HIGH (ref 0.0–0.2)

## 2024-05-26 MED ORDER — SILVER SULFADIAZINE 1 % EX CREA: 1.0000 | TOPICAL_CREAM | Freq: Every day | CUTANEOUS | 0 refills | Status: AC | PRN

## 2024-05-26 MED ORDER — TRIAMCINOLONE ACETONIDE 0.1 % EX CREA: 1.0000 | TOPICAL_CREAM | Freq: Two times a day (BID) | CUTANEOUS | 0 refills | Status: AC | PRN

## 2024-05-26 MED ORDER — TRIAMCINOLONE ACETONIDE 0.1 % EX CREA
TOPICAL_CREAM | Freq: Once | CUTANEOUS | Status: DC
Start: 2024-05-26 — End: 2024-05-27
  Filled 2024-05-26: qty 15

## 2024-05-26 MED ORDER — SILVER SULFADIAZINE 1 % EX CREA
TOPICAL_CREAM | Freq: Once | CUTANEOUS | Status: AC
  Filled 2024-05-26: qty 85

## 2024-05-26 MED ORDER — ORAL CARE MOUTH RINSE: 15.0000 mL | OROMUCOSAL | Status: DC

## 2024-05-26 MED ORDER — LACTATED RINGERS IV SOLN: INTRAVENOUS | Status: DC

## 2024-05-26 MED ORDER — FAMOTIDINE IN NACL 20-0.9 MG/50ML-% IV SOLN
20.0000 mg | Freq: Two times a day (BID) | INTRAVENOUS | Status: DC
  Filled 2024-05-26: qty 50

## 2024-05-26 MED ORDER — ORAL CARE MOUTH RINSE: 15.0000 mL | OROMUCOSAL | Status: DC | PRN

## 2024-05-26 MED ORDER — DEXTROSE 5 % IV SOLN
800.0000 mg | Freq: Three times a day (TID) | INTRAVENOUS | Status: DC
  Administered 2024-05-26: 800 mg via INTRAVENOUS
  Filled 2024-05-26 (×4): qty 16

## 2024-05-26 NOTE — Consult Note (Signed)
 NAME:  Kim Jenkins, MRN:  [id number], DOB:  1966-07-06, LOS: 0 ADMISSION DATE:  05/26/2024, CONSULTATION DATE:  05/26/24 REFERRING MD:  Cottie , CHIEF COMPLAINT:  rash   History of Present Illness:  58 yo F SNF resident chronic trach noct vent OHS/OSA who presented to ED 05/26/24 w worsening rash x 1 week. Pt was to be admitted to [facility] for possible disseminated zoster. After seeing the pt, [facility] has asked that pt instead be txf to OSH with derm and will not be admitted here.   PCCM has been asked to consult in interim for her chronic trach and noct vent while she is here.  Her paperwork from [facility] does not speak to her pulmonary plan of care at all so it is difficult to know what she is on.   Pertinent  Medical History  OSA/OHS Chronic trach Chronic nocturnal vent Dementia Bipolar disorder   Significant Hospital Events: Including procedures, antibiotic start and stop dates in addition to other pertinent events   05/26/24 Ed for eval of rash. Declined admission to [facility] with ask for ED-Ed txf. PCCM consult for chronic trach.   Interim History / Subjective:  Declined in admission by hospitalist   Objective    Blood pressure (!) 152/90, pulse 95, temperature 98.6 F (37 C), temperature source Oral, resp. rate 20, SpO2 97%.    FiO2 (%):  [30 %-50 %] 50 %  No intake or output data in the 24 hours ending 05/26/24 1647 There were no vitals filed for this visit.  Examination: General: chronically ill obese F  Neuro: awake, follows commands, oriented x2  HENT: L chronic appearing eye defect. Anicteric sclera. Trach secure with PMV in place  Lungs: even unlabored on ATC  Cardiovascular: rrr Abdomen: soft obese  GU: defer  Extremities: no obvious acute joint deformity Skin: pale with scattered papular rash, some pustules.    Resolved problem list   Assessment and Plan   Rash  -hospitalist has declined admission and patient is instead to be transferred to OSH for derm    Chronic resp  failure with hypoxia and hypercarbia OSA/OHS Trach dependence Noct vent dependence   P -routine trach care as is followed by [facility] -I spoke with [facility] - she is on noct vent ACV Vt 500 RR 18 FiO2 28 PEEP 8.  Will order for similar, on PRVC, and continue ATC during the day -SLP consult for PMV management.   -the balloon for her trach MUST be deflated before PMV use.  -apparently she tolerates a regular diet with thin liquids when she is off the vent    -we generally follow these patients once a week with stable needs, let us  know if we are needed sooner while patient is in the ED     Labs   CBC: Recent Labs  Lab 05/26/24 1405  WBC 7.9  HGB 11.5*  HCT 39.2  MCV 93.3  PLT 284    Basic Metabolic Panel: Recent Labs  Lab 05/26/24 1405  NA 137  K 3.5  CL 96*  CO2 28  GLUCOSE 173*  BUN 33*  CREATININE 0.92  CALCIUM  9.1   GFR: CrCl cannot be calculated (Unknown ideal weight.). Recent Labs  Lab 05/26/24 1405  WBC 7.9    Liver Function Tests: No results for input(s): AST, ALT, ALKPHOS, BILITOT, PROT, ALBUMIN  in the last 168 hours. No results for input(s): LIPASE, AMYLASE in the last 168 hours. No results for input(s): AMMONIA in the last 168 hours.  ABG    Component Value Date/Time   PHART 7.345 (L) 10/25/2022 2355   PCO2ART 69.4 (HH) 10/25/2022 2355   PO2ART 60 (L) 10/25/2022 2355   HCO3 37.9 (H) 10/25/2022 2355   TCO2 40 (H) 10/25/2022 2355   O2SAT 88 10/25/2022 2355     Coagulation Profile: No results for input(s): INR, PROTIME in the last 168 hours.  Cardiac Enzymes: No results for input(s): CKTOTAL, CKMB, CKMBINDEX, TROPONINI in the last 168 hours.  HbA1C: Hgb A1c MFr Bld  Date/Time Value Ref Range Status  10/26/2022 01:57 AM 5.4 4.8 - 5.6 % Final    Comment:    (NOTE) Pre diabetes:          5.7%-6.4%  Diabetes:              >6.4%  Glycemic control for   <7.0% adults with diabetes   12/05/2019 06:15 AM 5.2 4.8 -  5.6 % Final    Comment:    (NOTE) Pre diabetes:          5.7%-6.4% Diabetes:              >6.4% Glycemic control for   <7.0% adults with diabetes     CBG: No results for input(s): GLUCAP in the last 168 hours.  Review of Systems:   Limited by dementia  Past Medical History:  She,  has a past medical history of Bipolar 1 disorder (HCC), Chronic pain, Depression, Diabetes mellitus (HCC), Hypertension, Kidney stones, Morbid obesity (HCC), Obesity hypoventilation syndrome (HCC), Obstructive sleep apnea, and Panniculitis.   Surgical History:   Past Surgical History:  Procedure Laterality Date   ESOPHAGOGASTRODUODENOSCOPY N/A 05/06/2018   Procedure: ESOPHAGOGASTRODUODENOSCOPY (EGD);  Surgeon: Wilhelmenia Aloha Raddle., MD;  [facility];  Service: Gastroenterology;  Laterality: N/A;   RIGHT OOPHORECTOMY     TRACHEOSTOMY     VENTRAL HERNIA REPAIR       Social History:   reports that she has never smoked. She has never used smokeless tobacco. She reports that she does not drink alcohol  and does not use drugs.   Family History:  Her family history is not on file.   Allergies Allergies  Allergen Reactions   Meperidine And Related Shortness Of Breath and Other (See Comments)    Allergic, per [facility]   Cefazolin Other (See Comments)    Allergic, per [facility]     Home Medications  Prior to Admission medications   Medication Sig Start Date End Date Taking? Authorizing Provider  divalproex  (DEPAKOTE  ER) 500 MG 24 hr tablet Take 2 tablets (1,000 mg total) by mouth every 12 (twelve) hours. 09/13/18   Ward, Jaime Pilcher, PA-C  erythromycin ophthalmic ointment Place 1 Application into both eyes 3 (three) times daily.    [provider]  famotidine  (PEPCID ) 40 MG tablet Take 40 mg by mouth 2 (two) times daily.    [provider]  fentaNYL  (DURAGESIC ) 25 MCG/HR Place 1 patch onto the skin every 3 (three) days. 11/30/22   Fleeta Valeria Mayo, MD  FLUoxetine  (PROZAC ) 20  MG capsule Take 20 mg by mouth every morning. 08/25/22   [provider]  gabapentin  (NEURONTIN ) 300 MG capsule Take 300 mg by mouth 2 (two) times daily.    [provider]  guaiFENesin  (MUCINEX ) 600 MG 12 hr tablet Take 1 tablet (600 mg total) by mouth 2 (two) times daily. 10/27/22   Aniceto Daphne CROME, NP  hydrALAZINE  (APRESOLINE ) 50 MG tablet Take 50 mg by mouth every 6 (  six) hours as needed (SBP > 160 and DPB > 100).    [provider]  loperamide (IMODIUM A-D) 2 MG tablet Take 2 mg by mouth every 6 (six) hours as needed for diarrhea or loose stools.     [provider]  Multiple Vitamin (MULTIVITAMIN WITH MINERALS) TABS tablet Take 1 tablet by mouth daily.    [provider]  mupirocin  ointment (BACTROBAN ) 2 % Place 1 Application into the nose 2 (two) times daily. Patient not taking: Reported on 10/26/2022 09/12/22   Claudene Toribio BROCKS, MD  nitroGLYCERIN (NITROSTAT) 0.4 MG SL tablet Place 0.4 mg under the tongue every 5 (five) minutes x 3 doses as needed for chest pain (AND CALL PROVIDER).     [provider]  omeprazole (PRILOSEC) 40 MG capsule Take 40 mg by mouth every morning. 08/12/22   [provider]  ondansetron  (ZOFRAN -ODT) 4 MG disintegrating tablet Take 4 mg by mouth every 6 (six) hours as needed for nausea or vomiting (DISSOLVE ORALLY).    [provider]  Polyethyl Glyc-Propyl Glyc PF (SYSTANE HYDRATION PF) 0.4-0.3 % SOLN Place 1 drop into both eyes 3 (three) times daily.    [provider]  polyethylene glycol (MIRALAX  / GLYCOLAX ) 17 g packet Take 17 g by mouth every 12 (twelve) hours as needed for mild constipation.    [provider]  sodium chloride  (OCEAN) 0.65 % SOLN nasal spray Place 2 sprays into both nostrils 2 (two) times daily as needed for congestion.    [provider]  venlafaxine  XR (EFFEXOR -XR) 75 MG 24 hr capsule Take 75 mg by mouth every morning.    [provider]   VRAYLAR  4.5 MG CAPS Take 4.5 mg by mouth every morning. 08/16/22   [provider]  Zinc  Oxide (TRIPLE PASTE) 12.8 % ointment Apply topically 2 (two) times daily. Patient not taking: Reported on 10/26/2022 09/12/22   Claudene Toribio BROCKS, MD     Critical care time: na     Ronnald Gave MSN, AGACNP-BC [facility] Pulmonary/Critical Care Medicine Amion for pager  05/26/2024, 4:47 PM

## 2024-05-26 NOTE — ED Provider Notes (Signed)
 [place] EMERGENCY DEPARTMENT AT [facility] Provider Note   CSN: [id number] Arrival date & time: 05/26/24  1326     Patient presents with: Rash   Kim Jenkins is a 58 y.o. female with a history of obesity, tracheostomy, presenting from a nursing facility with concern for body rash.  This is ongoing for about 1 week.  Patient reports that the rash began on her abdomen and spread for the rest of her body.  It is burning and painful.   HPI     Prior to Admission medications   Medication Sig Start Date End Date Taking? Authorizing Provider  acetaminophen  (TYLENOL ) 325 MG tablet Take 650 mg by mouth every 6 (six) hours as needed for mild pain (pain score 1-3).   Yes [provider]  amLODipine (NORVASC) 5 MG tablet Take 5 mg by mouth daily.   Yes [provider]  aspirin 81 MG chewable tablet Chew 81 mg by mouth daily.   Yes [provider]  divalproex  (DEPAKOTE  ER) 500 MG 24 hr tablet Take 2 tablets (1,000 mg total) by mouth every 12 (twelve) hours. 09/13/18  Yes Ward, Ami Copes, PA-C  famotidine  (PEPCID ) 40 MG tablet Take 40 mg by mouth every evening.   Yes [provider]  fentaNYL  (DURAGESIC ) 25 MCG/HR Place 1 patch onto the skin every 3 (three) days. 11/30/22  Yes Fleeta Valeria Mayo, MD  FLUoxetine  (PROZAC ) 20 MG capsule Take 20 mg by mouth every morning. 08/25/22  Yes [provider]  gabapentin  (NEURONTIN ) 600 MG tablet Take 600 mg by mouth every 12 (twelve) hours.   Yes [provider]  hydrALAZINE  (APRESOLINE ) 50 MG tablet Take 50 mg by mouth every 6 (six) hours as needed (SBP > 160 and DPB > 100).   Yes [provider]  ipratropium-albuterol  (DUONEB) 0.5-2.5 (3) MG/3ML SOLN Take 3 mLs by nebulization every 6 (six) hours as needed (for shortness of breath).   Yes [provider]  metformin (FORTAMET) 500 MG (OSM) 24 hr tablet Take 500 mg by mouth 2 (two) times daily with a meal.   Yes [provider]  Multiple Vitamin (MULTIVITAMIN WITH MINERALS) TABS tablet Take 1 tablet by mouth daily.   Yes [provider]  omeprazole (PRILOSEC) 40 MG capsule Take 40 mg by mouth every morning. 08/12/22  Yes [provider]  ondansetron  (ZOFRAN -ODT) 4 MG disintegrating tablet Take 4 mg by mouth every 6 (six) hours as needed for nausea or vomiting (DISSOLVE ORALLY).   Yes [provider]  Polyethyl Glyc-Propyl Glyc PF (SYSTANE HYDRATION PF) 0.4-0.3 % SOLN Place 1 drop into both eyes 4 (four) times daily.   Yes [provider]  pravastatin (PRAVACHOL) 20 MG tablet Take 20 mg by mouth daily.   Yes [provider]  silver  sulfADIAZINE  (SILVADENE ) 1 % cream Apply 1 Application topically daily as needed (pain, itching). 05/26/24  Yes Kammerer, Megan L, DO  triamcinolone  cream (KENALOG ) 0.1 % Apply 1 Application topically 2 (two) times daily as needed (pain, itching). Apply everywhere but face 05/26/24  Yes Kammerer, Megan L, DO  venlafaxine  XR (EFFEXOR -XR) 75 MG 24 hr capsule Take 75 mg by mouth every morning.   Yes [provider]  VRAYLAR  4.5 MG CAPS Take 4.5 mg by mouth every morning. 08/16/22  Yes [provider]  loperamide (IMODIUM A-D) 2 MG tablet Take 2 mg by mouth every 6 (six) hours as needed for diarrhea or loose stools.  [provider]  nitroGLYCERIN (NITROSTAT) 0.4 MG SL tablet Place 0.4 mg under the tongue every 5 (five) minutes x 3 doses as needed for chest pain (AND CALL PROVIDER).     [provider]  polyethylene glycol (MIRALAX  / GLYCOLAX ) 17 g packet Take 17 g by mouth every 12 (twelve) hours as needed for mild constipation.    [provider]  sodium chloride  (OCEAN) 0.65 % SOLN nasal spray Place 2 sprays into both nostrils 2 (two) times daily as needed for congestion.    [provider]    Allergies: Meperidine and related and Cefazolin    Review of Systems  Updated Vital  Signs BP (!) 143/120   Pulse 91   Temp 98.2 F (36.8 C) (Oral)   Resp 18   Ht 5' 6 (1.676 m)   Wt (!) 160 kg   SpO2 100%   BMI 56.93 kg/m   Physical Exam Constitutional:      General: She is not in acute distress. HENT:     Head: Normocephalic and atraumatic.  Eyes:     Conjunctiva/sclera: Conjunctivae normal.     Pupils: Pupils are equal, round, and reactive to light.  Cardiovascular:     Rate and Rhythm: Normal rate and regular rhythm.  Pulmonary:     Effort: Pulmonary effort is normal. No respiratory distress.  Abdominal:     General: There is no distension.     Tenderness: There is no abdominal tenderness.  Skin:    General: Skin is warm and dry.     Comments: Vesicular rash noted involving all surfaces of the body and the face, no sloughing of the mucosa, small bullae formed on the larger rash  Neurological:     General: No focal deficit present.     Mental Status: She is alert. Mental status is at baseline.  Psychiatric:        Mood and Affect: Mood normal.        Behavior: Behavior normal.     (all labs ordered are listed, but only abnormal results are displayed) Labs Reviewed  BASIC METABOLIC PANEL WITH GFR - Abnormal; Notable for the following components:      Result Value   Chloride 96 (*)    Glucose, Bld 173 (*)    BUN 33 (*)    All other components within normal limits  CBC - Abnormal; Notable for the following components:   Hemoglobin 11.5 (*)    MCHC 29.3 (*)    RDW 15.8 (*)    nRBC 0.3 (*)    All other components within normal limits    EKG: EKG Interpretation Date/Time:  Monday May 26 2024 13:40:20 EDT Ventricular Rate:  95 PR Interval:  138 QRS Duration:  66 QT Interval:  456 QTC Calculation: 574 R Axis:   94  Text Interpretation: Sinus rhythm Borderline right axis deviation Borderline T abnormalities, lateral leads Prolonged QT interval Confirmed by Cottie Cough [phone]) on 05/26/2024 2:14:36 PM  Radiology: No results  found.   Procedures   Medications Ordered in the ED  silver  sulfADIAZINE  (SILVADENE ) 1 % cream ( Topical Given 05/26/24 2151)    Clinical Course as of 05/27/24 0759  Mon May 26, 2024  1506 ICU team reports patient does not require Pulm Critical admission if she is on baseline vent needs/settings (vent dependant at night), but admitting team can consult pulm as needed for vent management [MT]  1537 I spoke to Dr Arlice who would prefer to  evaluate the patient's rash prior to admission [MT]  1612 Hospitalist declines admission.  Does not feel this is consistent with zoster infection and requested transfer to another hospital with dermatology present.  I've asked our secretary to reach out to [facility] and have requested pulm consult for ventilator management.   [MT]  1642 General surgery team reports if patient is still pending transfer tomorrow in AM they can be consulted for bunch or skin biopsy as needed.  At this time we are awaiting callback from [facility] dermatology regarding potential transfer and rash evaluation.  Dr Remonia EDP assuming care of this patient.  I've asked pharm and RN to perform med rec as well [MT]    Clinical Course User Index [MT] Lili Harts, Donnice PARAS, MD                                 Medical Decision Making Amount and/or Complexity of Data Reviewed Labs: ordered.  Risk Prescription drug management.   Patient is here with diffuse vesicular body rash that is most consistent with disseminated herpes zoster.  No evidence of encephalitis or confusion.  Doubt meningitis.  No severe headache or fever on arrival.  Less likely staph scalded skin syndrome or steven johnson's/TENS.  No significant sloughing or enlarged bullae noted.    Airborne/contact precautions ordered  Supplemental history provided by EMS  Basic labs reviewed - no emergent findings; WBC wnl  IV acyclovir  per pharmacy initially ordered for potential zoster coverage.  However, after consultation with  hospitalist and ID consultant, this rash was felt to be less consistent with zoster.  See ED course.     Final diagnoses:  Rash    ED Discharge Orders          Ordered    triamcinolone  cream (KENALOG ) 0.1 %  2 times daily PRN        05/26/24 1848    silver  sulfADIAZINE  (SILVADENE ) 1 % cream  Daily PRN        05/26/24 1848               Cottie Donnice PARAS, MD 05/27/24 0800

## 2024-05-26 NOTE — ED Triage Notes (Signed)
 Pt BIB EMS for body rash that started approx 1 week ago, started on her belly and has spread all over since. Become more painful, lesions are a deep red color, some open and oozing. Pt has dementia at baseline.

## 2024-05-26 NOTE — Consult Note (Signed)
 We were asked to see this patient in consultation regarding her chronic trach management while admitted, with initial plan to admit to hospitalist for possible disseminated zoster.  I have since been informed by hospitalist that the patient will instead be transferred ED-ED for dermatology evaluation.   As the patient is not being admitted here, we will sign off.  Please let us  know if we can be of further assistance   Ronnald Gave MSN, AGACNP-BC [facility] Pulmonary/Critical Care Medicine 05/26/2024, 4:04 PM

## 2024-05-26 NOTE — Progress Notes (Signed)
 Pharmacy Antibiotic Note  Kim Jenkins is a 58 y.o. female admitted on 05/26/2024 disseminated herpes zoster.  Pharmacy has been consulted for acyclovir  dosing.  Plan: Acyclovir  10mg /kg (adjusted BW) IV q 8h IVF LR @125  Monitor renal function, clinical progression and ability to transition to PO      Temp (24hrs), Avg:98.6 F (37 C), Min:98.6 F (37 C), Max:98.6 F (37 C)  Recent Labs  Lab 05/26/24 1405  WBC 7.9  CREATININE 0.92    CrCl cannot be calculated (Unknown ideal weight.).    Allergies  Allergen Reactions   Meperidine And Related Shortness Of Breath and Other (See Comments)    Allergic, per MAR   Cefazolin Other (See Comments)    Allergic, per [facility]    Dorn Poot, PharmD, [facility] Clinical Pharmacist ED Pharmacist Phone # [phone] 05/26/2024 2:52 PM

## 2024-05-26 NOTE — ED Notes (Signed)
 [facility] called for transport.

## 2024-05-26 NOTE — Discharge Instructions (Signed)
 The dermatology office will call you and make a follow-up appointment for you within the next 2 days.  You can use the creams as prescribed.

## 2024-05-26 NOTE — ED Notes (Signed)
 CALLED [facility]

## 2024-05-26 NOTE — ED Notes (Signed)
 Got patient on the monitor did EKG shown to er provider patient is resting with call [facility] reach

## 2024-05-26 NOTE — Progress Notes (Signed)
  CONSULT NOTE  Sharlet Notaro  DOB: [birth year]  PCP: Fleeta Valeria Mayo, MD FMW:[id number]  DOA: 05/26/2024  LOS: 0 days  Hospital Day: 1  Brief narrative: Kim Jenkins is a 58 y.o. female with PMH significant for morbid obesity, OSA, DM2, HTN, dementia, bipolar disorder, tracheostomy status who lives in a nursing home. Patient was brought to the ED by EMS today with complaint of generalized body rash that started about a week ago.  It first started on her abdomen and then spread all over since.  In the ED, afebrile, hemodynamically stable Labs with WBC count 7.9, hemoglobin 11.5, platelet 284 ED attending suspected disseminated shingles and started patient on IV acyclovir  Requested hospitalist service for patient management.  I reviewed the chart.  Patient does not have any prior history of shingles, not immunocompromised. I went down and examined the patient  On exam, Vital signs stable Morbidly obese, on tracheostomy Not in distress Alert, awake, slow to respond, uses only few words. She has generalized lesions which are crusted, somewhere vesicular, with large fluid-filled bullae in red background.  They are not in any dermatomal pattern. On touching the lesions, patient does not complain of any pain. Hurts on deep pressure at some places but not consistent I took several pictures -evaluated in media section  I curbside discussed the case and reviewed the pictures with ID attending Dr. Dennise. In the absence of infectious presentation, lack of immunocompromised status, this does not look like disseminated herpes.  This may be some other dermatological process. She needs to be evaluated with dermatology which unfortunately we do not have here at [facility]. Dr. Dennise and I called and discussed this with EDP Dr. Cottie. At this time the recommendation is to transfer her to tertiary care center where there is dermatology service on staff. I have relayed this message to critical care as well who was  consulted earlier from ED.   Signed, Chapman Rota, MD [facility] 05/26/2024

## 2024-05-26 NOTE — ED Provider Notes (Signed)
 I took over care of this patient at 5 PM. She does have some discomfort from the rash but no other new symptoms.  Physical Exam  BP (!) 143/120   Pulse 91   Temp 98.2 F (36.8 C) (Oral)   Resp 18   Ht 5' 6 (1.676 m)   Wt (!) 160 kg   SpO2 100%   BMI 56.93 kg/m   Physical Exam Skin:    Findings: Rash present.     Comments: Rash on feet/hands but not palms, soles, it is non blanching      Procedures  Procedures  ED Course / MDM   Clinical Course as of 05/26/24 2249  Mon May 26, 2024  1506 ICU team reports patient does not require Pulm Critical admission if she is on baseline vent needs/settings (vent dependant at night), but admitting team can consult pulm as needed for vent management [MT]  1537 I spoke to Dr Arlice who would prefer to evaluate the patient's rash prior to admission [MT]  1612 Hospitalist declines admission.  Does not feel this is consistent with zoster infection and requested transfer to another hospital with dermatology present.  I've asked our secretary to reach out to [facility] and have requested pulm consult for ventilator management.   [MT]  1642 General surgery team reports if patient is still pending transfer tomorrow in AM they can be consulted for bunch or skin biopsy as needed.  At this time we are awaiting callback from [facility] dermatology regarding potential transfer and rash evaluation.  Dr Remonia EDP assuming care of this patient.  I've asked pharm and RN to perform med rec as well [MT]    Clinical Course User Index [MT] Trifan, Donnice PARAS, MD   Medical Decision Making I discussed patient's case with the on-call dermatology provider at [facility].  We discussed the patient's case and her rash.  She thinks that this is likely not TENS/SJS and can likely follow-up in the office.  She will place the patient on the urgent follow-up with us  and they will call the nursing home tomorrow to arrange follow-up in the next 48 hours for this patient.  She  recommended triamcinolone  cream as well as Silvadene  as needed.  I prescribed this to the patient we placed them here.  Patient will be discharged back to the nursing home.  She feels comfortable with this plan.  Dermatology felt there was no reason to obtain skin biopsy or admit the patient now and they can see her in the office and get all this done fairly quickly.  Problems Addressed: Rash: undiagnosed new problem with uncertain prognosis  Amount and/or Complexity of Data Reviewed External Data Reviewed: notes. Labs: ordered. Decision-making details documented in ED Course.  Risk OTC drugs. Prescription drug management. Diagnosis or treatment significantly limited by social determinants of health.          Gennaro Duwaine CROME, DO 05/26/24 2249

## 2024-05-31 ENCOUNTER — Other Ambulatory Visit: Payer: Self-pay | Admitting: Internal Medicine

## 2024-05-31 MED ORDER — FENTANYL 25 MCG/HR TD PT72: 1.0000 | MEDICATED_PATCH | TRANSDERMAL | 0 refills | Status: DC

## 2024-06-05 DIAGNOSIS — R000 Tachycardia, unspecified: Secondary | ICD-10-CM | POA: Diagnosis not present

## 2024-07-29 ENCOUNTER — Other Ambulatory Visit: Payer: Self-pay | Admitting: Internal Medicine

## 2024-08-26 ENCOUNTER — Other Ambulatory Visit: Payer: Self-pay | Admitting: Internal Medicine

## 2024-08-26 MED ORDER — FENTANYL 25 MCG/HR TD PT72: 1.0000 | MEDICATED_PATCH | TRANSDERMAL | 0 refills | Status: AC

## 2024-10-13 ENCOUNTER — Encounter: Payer: Self-pay | Admitting: Internal Medicine

## 2024-10-20 ENCOUNTER — Inpatient Hospital Stay (HOSPITAL_COMMUNITY): Admission: EM | Admit: 2024-10-20 | Source: Home / Self Care

## 2024-10-20 ENCOUNTER — Emergency Department (HOSPITAL_COMMUNITY)

## 2024-10-20 DIAGNOSIS — G9341 Metabolic encephalopathy: Secondary | ICD-10-CM

## 2024-10-20 DIAGNOSIS — J189 Pneumonia, unspecified organism: Secondary | ICD-10-CM | POA: Diagnosis not present

## 2024-10-20 DIAGNOSIS — E8809 Other disorders of plasma-protein metabolism, not elsewhere classified: Secondary | ICD-10-CM | POA: Diagnosis not present

## 2024-10-20 DIAGNOSIS — A419 Sepsis, unspecified organism: Secondary | ICD-10-CM | POA: Diagnosis present

## 2024-10-20 DIAGNOSIS — Z930 Tracheostomy status: Secondary | ICD-10-CM

## 2024-10-20 DIAGNOSIS — R4182 Altered mental status, unspecified: Secondary | ICD-10-CM

## 2024-10-20 DIAGNOSIS — G4733 Obstructive sleep apnea (adult) (pediatric): Secondary | ICD-10-CM | POA: Diagnosis not present

## 2024-10-20 DIAGNOSIS — R6521 Severe sepsis with septic shock: Secondary | ICD-10-CM | POA: Diagnosis not present

## 2024-10-20 DIAGNOSIS — N390 Urinary tract infection, site not specified: Principal | ICD-10-CM

## 2024-10-20 LAB — URINALYSIS, W/ REFLEX TO CULTURE (INFECTION SUSPECTED)
Glucose, UA: NEGATIVE mg/dL
Ketones, ur: NEGATIVE mg/dL
Nitrite: POSITIVE — AB
Protein, ur: 30 mg/dL — AB
Specific Gravity, Urine: 1.025 (ref 1.005–1.030)
WBC, UA: >50 WBC/hpf (ref 0–5)
pH: 5 (ref 5.0–8.0)

## 2024-10-20 LAB — CBC WITH DIFFERENTIAL/PLATELET
Abs Immature Granulocytes: 0.16 10*3/uL — ABNORMAL HIGH (ref 0.00–0.07)
Basophils Absolute: 0 10*3/uL (ref 0.0–0.1)
Basophils Relative: 0 %
Eosinophils Absolute: 0 10*3/uL (ref 0.0–0.5)
Eosinophils Relative: 0 %
HCT: 38.2 % (ref 36.0–46.0)
Hemoglobin: 11.6 g/dL — ABNORMAL LOW (ref 12.0–15.0)
Immature Granulocytes: 1 %
Lymphocytes Relative: 15 %
Lymphs Abs: 2.3 10*3/uL (ref 0.7–4.0)
MCH: 30.1 pg (ref 26.0–34.0)
MCHC: 30.4 g/dL (ref 30.0–36.0)
MCV: 99.2 fL (ref 80.0–100.0)
Monocytes Absolute: 2.1 10*3/uL — ABNORMAL HIGH (ref 0.1–1.0)
Monocytes Relative: 13 %
Neutro Abs: 10.9 10*3/uL — ABNORMAL HIGH (ref 1.7–7.7)
Neutrophils Relative %: 71 %
Platelets: 252 10*3/uL (ref 150–400)
RBC: 3.85 MIL/uL — ABNORMAL LOW (ref 3.87–5.11)
RDW: 16.2 % — ABNORMAL HIGH (ref 11.5–15.5)
WBC: 15.4 10*3/uL — ABNORMAL HIGH (ref 4.0–10.5)
nRBC: 0.1 % (ref 0.0–0.2)

## 2024-10-20 LAB — COMPREHENSIVE METABOLIC PANEL WITH GFR
ALT: 10 U/L (ref 0–44)
AST: 33 U/L (ref 15–41)
Albumin: <1.5 g/dL — ABNORMAL LOW (ref 3.5–5.0)
Alkaline Phosphatase: 231 U/L — ABNORMAL HIGH (ref 38–126)
Anion gap: 7 (ref 5–15)
BUN: 19 mg/dL (ref 6–20)
CO2: 32 mmol/L (ref 22–32)
Calcium: 7.8 mg/dL — ABNORMAL LOW (ref 8.9–10.3)
Chloride: 98 mmol/L (ref 98–111)
Creatinine, Ser: 0.58 mg/dL (ref 0.44–1.00)
GFR, Estimated: >60 mL/min
Glucose, Bld: 125 mg/dL — ABNORMAL HIGH (ref 70–99)
Potassium: 3.3 mmol/L — ABNORMAL LOW (ref 3.5–5.1)
Sodium: 137 mmol/L (ref 135–145)
Total Bilirubin: 0.5 mg/dL (ref 0.0–1.2)
Total Protein: 5.8 g/dL — ABNORMAL LOW (ref 6.5–8.1)

## 2024-10-20 LAB — BLOOD GAS, VENOUS
Acid-Base Excess: 9.1 mmol/L — ABNORMAL HIGH (ref 0.0–2.0)
Bicarbonate: 34.6 mmol/L — ABNORMAL HIGH (ref 20.0–28.0)
O2 Saturation: 75.9 %
Patient temperature: 37
pCO2, Ven: 51 mmHg (ref 44–60)
pH, Ven: 7.44 — ABNORMAL HIGH (ref 7.25–7.43)
pO2, Ven: 45 mmHg (ref 32–45)

## 2024-10-20 LAB — PROTIME-INR
INR: 1.4 — ABNORMAL HIGH (ref 0.8–1.2)
Prothrombin Time: 17.7 s — ABNORMAL HIGH (ref 11.4–15.2)

## 2024-10-20 LAB — I-STAT CG4 LACTIC ACID, ED
Lactic Acid, Venous: 1.8 mmol/L (ref 0.5–1.9)
Lactic Acid, Venous: 2.1 mmol/L (ref 0.5–1.9)

## 2024-10-20 LAB — RESP PANEL BY RT-PCR (RSV, FLU A&B, COVID)  RVPGX2
Influenza A by PCR: NEGATIVE
Influenza B by PCR: NEGATIVE
Resp Syncytial Virus by PCR: NEGATIVE
SARS Coronavirus 2 by RT PCR: NEGATIVE

## 2024-10-20 MED ORDER — VANCOMYCIN HCL 2000 MG/400ML IV SOLN
2000.0000 mg | Freq: Once | INTRAVENOUS | Status: AC
  Administered 2024-10-20: 2000 mg via INTRAVENOUS
  Filled 2024-10-20: qty 400

## 2024-10-20 MED ORDER — SENNA 8.6 MG PO TABS: 1.0000 | ORAL_TABLET | Freq: Two times a day (BID) | ORAL | Status: DC | PRN

## 2024-10-20 MED ORDER — ENOXAPARIN SODIUM 80 MG/0.8ML IJ SOSY
80.0000 mg | PREFILLED_SYRINGE | INTRAMUSCULAR | Status: AC
  Administered 2024-10-21 – 2024-10-24 (×5): 80 mg via SUBCUTANEOUS
  Filled 2024-10-20 (×5): qty 0.8

## 2024-10-20 MED ORDER — HYDROCORTISONE SOD SUC (PF) 100 MG IJ SOLR
100.0000 mg | Freq: Three times a day (TID) | INTRAMUSCULAR | Status: DC
  Administered 2024-10-20 – 2024-10-21 (×3): 100 mg via INTRAVENOUS
  Filled 2024-10-20 (×3): qty 2

## 2024-10-20 MED ORDER — CHLORHEXIDINE GLUCONATE CLOTH 2 % EX PADS
6.0000 | MEDICATED_PAD | Freq: Every day | CUTANEOUS | Status: AC
  Administered 2024-10-20 – 2024-10-24 (×5): 6 via TOPICAL

## 2024-10-20 MED ORDER — LACTATED RINGERS IV BOLUS
1000.0000 mL | Freq: Once | INTRAVENOUS | Status: AC
  Administered 2024-10-20: 1000 mL via INTRAVENOUS

## 2024-10-20 MED ORDER — PIPERACILLIN-TAZOBACTAM 3.375 G IVPB 30 MIN
3.3750 g | Freq: Once | INTRAVENOUS | Status: AC
  Administered 2024-10-20: 3.375 g via INTRAVENOUS
  Filled 2024-10-20: qty 50

## 2024-10-20 MED ORDER — NOREPINEPHRINE 4 MG/250ML-% IV SOLN
0.0000 ug/min | INTRAVENOUS | Status: DC
  Administered 2024-10-20: 5 ug/min via INTRAVENOUS
  Administered 2024-10-21: 7 ug/min via INTRAVENOUS
  Filled 2024-10-20 (×2): qty 250

## 2024-10-20 MED ORDER — POLYETHYLENE GLYCOL 3350 17 G PO PACK: 17.0000 g | PACK | Freq: Every day | ORAL | Status: DC | PRN

## 2024-10-20 MED ORDER — PIPERACILLIN-TAZOBACTAM 3.375 G IVPB
3.3750 g | Freq: Three times a day (TID) | INTRAVENOUS | Status: DC
  Administered 2024-10-21 – 2024-10-24 (×12): 3.375 g via INTRAVENOUS
  Filled 2024-10-20 (×12): qty 50

## 2024-10-20 MED ORDER — SODIUM CHLORIDE 0.9 % IV SOLN
250.0000 mL | INTRAVENOUS | Status: AC
  Administered 2024-10-21: 250 mL via INTRAVENOUS

## 2024-10-20 MED ORDER — NOREPINEPHRINE 4 MG/250ML-% IV SOLN: 0.0000 ug/min | INTRAVENOUS | Status: DC

## 2024-10-20 MED ORDER — ENOXAPARIN SODIUM 40 MG/0.4ML IJ SOSY: 40.0000 mg | PREFILLED_SYRINGE | INTRAMUSCULAR | Status: DC

## 2024-10-20 MED ORDER — NOREPINEPHRINE 4 MG/250ML-% IV SOLN: 8.0000 ug/min | INTRAVENOUS | Status: DC

## 2024-10-20 MED ORDER — LACTATED RINGERS IV BOLUS (SEPSIS)
1000.0000 mL | Freq: Once | INTRAVENOUS | Status: AC
  Administered 2024-10-20: 1000 mL via INTRAVENOUS

## 2024-10-20 MED ORDER — VANCOMYCIN HCL 10 G IV SOLR
2500.0000 mg | INTRAVENOUS | Status: DC
  Filled 2024-10-20 (×4): qty 25

## 2024-10-20 NOTE — ED Triage Notes (Signed)
 BIB EMS from [facility] for hypotension, decreased LOC. Is trached at baseline. Normally feeds herself and only requires ventilation at night. BP's with systolic's in 90's, 70's. CBG 133. 200cc bolus given throught 20g PIV in left upper bicep.

## 2024-10-20 NOTE — ED Notes (Signed)
 Fentanyl  patch 25 mcg/hr removed from R upper shoulder/chest.  Disposed of per policy.  Witnessed by Ozell armin Pleasant, RN

## 2024-10-20 NOTE — Progress Notes (Signed)
 A consult was received from an ED physician for vancomycin  and zosyn  per pharmacy dosing (for an indication other than meningitis). The patient's profile has been reviewed for ht/wt/allergies/indication/available labs. A one time order has been placed for the above antibiotics.  Further antibiotics/pharmacy consults should be ordered by admitting physician if indicated.                       Bard Jeans, PharmD, BCPS [phone] 10/20/2024, 7:04 PM

## 2024-10-20 NOTE — ED Notes (Addendum)
 Pt with difficult IV access. Able to get  a maximum of 3 mL of blood for one aerobic blood culture bottle. Through US  placed IV. Significant anasarca present during US  IV attempt.

## 2024-10-21 ENCOUNTER — Inpatient Hospital Stay (HOSPITAL_COMMUNITY)

## 2024-10-21 DIAGNOSIS — Z930 Tracheostomy status: Secondary | ICD-10-CM | POA: Diagnosis not present

## 2024-10-21 DIAGNOSIS — R0609 Other forms of dyspnea: Secondary | ICD-10-CM

## 2024-10-21 DIAGNOSIS — G4733 Obstructive sleep apnea (adult) (pediatric): Secondary | ICD-10-CM | POA: Diagnosis not present

## 2024-10-21 DIAGNOSIS — N390 Urinary tract infection, site not specified: Secondary | ICD-10-CM | POA: Diagnosis not present

## 2024-10-21 DIAGNOSIS — R6521 Severe sepsis with septic shock: Secondary | ICD-10-CM

## 2024-10-21 DIAGNOSIS — A419 Sepsis, unspecified organism: Secondary | ICD-10-CM | POA: Diagnosis not present

## 2024-10-21 DIAGNOSIS — E8809 Other disorders of plasma-protein metabolism, not elsewhere classified: Secondary | ICD-10-CM | POA: Diagnosis not present

## 2024-10-21 LAB — CBC WITH DIFFERENTIAL/PLATELET
Abs Immature Granulocytes: 0.26 10*3/uL — ABNORMAL HIGH (ref 0.00–0.07)
Basophils Absolute: 0.1 10*3/uL (ref 0.0–0.1)
Basophils Relative: 0 %
Eosinophils Absolute: 0 10*3/uL (ref 0.0–0.5)
Eosinophils Relative: 0 %
HCT: 41.2 % (ref 36.0–46.0)
Hemoglobin: 12.4 g/dL (ref 12.0–15.0)
Immature Granulocytes: 1 %
Lymphocytes Relative: 4 %
Lymphs Abs: 0.8 10*3/uL (ref 0.7–4.0)
MCH: 30.2 pg (ref 26.0–34.0)
MCHC: 30.1 g/dL (ref 30.0–36.0)
MCV: 100.5 fL — ABNORMAL HIGH (ref 80.0–100.0)
Monocytes Absolute: 1 10*3/uL (ref 0.1–1.0)
Monocytes Relative: 5 %
Neutro Abs: 18.6 10*3/uL — ABNORMAL HIGH (ref 1.7–7.7)
Neutrophils Relative %: 90 %
Platelets: 353 10*3/uL (ref 150–400)
RBC: 4.1 MIL/uL (ref 3.87–5.11)
RDW: 16.6 % — ABNORMAL HIGH (ref 11.5–15.5)
WBC: 20.7 10*3/uL — ABNORMAL HIGH (ref 4.0–10.5)
nRBC: 0.1 % (ref 0.0–0.2)

## 2024-10-21 LAB — COMPREHENSIVE METABOLIC PANEL WITH GFR
ALT: 11 U/L (ref 0–44)
AST: 31 U/L (ref 15–41)
Albumin: 1.5 g/dL — ABNORMAL LOW (ref 3.5–5.0)
Alkaline Phosphatase: 238 U/L — ABNORMAL HIGH (ref 38–126)
Anion gap: 10 (ref 5–15)
BUN: 19 mg/dL (ref 6–20)
CO2: 28 mmol/L (ref 22–32)
Calcium: 7.6 mg/dL — ABNORMAL LOW (ref 8.9–10.3)
Chloride: 98 mmol/L (ref 98–111)
Creatinine, Ser: 0.57 mg/dL (ref 0.44–1.00)
GFR, Estimated: >60 mL/min
Glucose, Bld: 183 mg/dL — ABNORMAL HIGH (ref 70–99)
Potassium: 3.2 mmol/L — ABNORMAL LOW (ref 3.5–5.1)
Sodium: 136 mmol/L (ref 135–145)
Total Bilirubin: 0.5 mg/dL (ref 0.0–1.2)
Total Protein: 6.3 g/dL — ABNORMAL LOW (ref 6.5–8.1)

## 2024-10-21 LAB — GLUCOSE, CAPILLARY
Glucose-Capillary: 133 mg/dL — ABNORMAL HIGH (ref 70–99)
Glucose-Capillary: 177 mg/dL — ABNORMAL HIGH (ref 70–99)
Glucose-Capillary: 172 mg/dL — ABNORMAL HIGH (ref 70–99)
Glucose-Capillary: 161 mg/dL — ABNORMAL HIGH (ref 70–99)
Glucose-Capillary: 162 mg/dL — ABNORMAL HIGH (ref 70–99)
Glucose-Capillary: 148 mg/dL — ABNORMAL HIGH (ref 70–99)

## 2024-10-21 LAB — MAGNESIUM: Magnesium: 1.7 mg/dL (ref 1.7–2.4)

## 2024-10-21 LAB — BLOOD GAS, VENOUS
Acid-Base Excess: 0.6 mmol/L (ref 0.0–2.0)
Bicarbonate: 26.6 mmol/L (ref 20.0–28.0)
O2 Saturation: 92.6 %
Patient temperature: 36.9
pCO2, Ven: 47 mmHg (ref 44–60)
pH, Ven: 7.36 (ref 7.25–7.43)
pO2, Ven: 64 mmHg — ABNORMAL HIGH (ref 32–45)

## 2024-10-21 LAB — ECHOCARDIOGRAM COMPLETE
AR max vel: 1.47 cm2
AV Area VTI: 1.63 cm2
AV Area mean vel: 1.46 cm2
AV Mean grad: 6.7 mmHg
AV Peak grad: 11 mmHg
Ao pk vel: 1.66 m/s
Area-P 1/2: 3.79 cm2
Calc EF: 65.8 %
MV VTI: 2.02 cm2
S' Lateral: 3.5 cm
Single Plane A2C EF: 66.8 %
Single Plane A4C EF: 70.3 %
Weight: 6109.39 [oz_av]

## 2024-10-21 LAB — MRSA NEXT GEN BY PCR, NASAL: MRSA by PCR Next Gen: DETECTED — AB

## 2024-10-21 LAB — STREP PNEUMONIAE URINARY ANTIGEN: Strep Pneumo Urinary Antigen: NEGATIVE

## 2024-10-21 LAB — PROCALCITONIN: Procalcitonin: 0.47 ng/mL

## 2024-10-21 LAB — LACTIC ACID, PLASMA: Lactic Acid, Venous: 2.1 mmol/L (ref 0.5–1.9)

## 2024-10-21 LAB — HIV ANTIBODY (ROUTINE TESTING W REFLEX): HIV Screen 4th Generation wRfx: NONREACTIVE

## 2024-10-21 LAB — PRO BRAIN NATRIURETIC PEPTIDE: Pro Brain Natriuretic Peptide: 5751 pg/mL — ABNORMAL HIGH

## 2024-10-21 LAB — PHOSPHORUS: Phosphorus: 4.4 mg/dL (ref 2.5–4.6)

## 2024-10-21 MED ORDER — ORAL CARE MOUTH RINSE
15.0000 mL | OROMUCOSAL | Status: DC
  Administered 2024-10-21: 15 mL via OROMUCOSAL

## 2024-10-21 MED ORDER — ORAL CARE MOUTH RINSE
15.0000 mL | OROMUCOSAL | Status: DC
  Administered 2024-10-21 – 2024-10-24 (×37): 15 mL via OROMUCOSAL

## 2024-10-21 MED ORDER — POTASSIUM CHLORIDE 10 MEQ/100ML IV SOLN
10.0000 meq | INTRAVENOUS | Status: AC
  Administered 2024-10-21 (×6): 10 meq via INTRAVENOUS
  Filled 2024-10-21 (×6): qty 100

## 2024-10-21 MED ORDER — ADULT MULTIVITAMIN W/MINERALS CH
1.0000 | ORAL_TABLET | Freq: Every day | ORAL | Status: DC
  Administered 2024-10-22: 1 via ORAL
  Filled 2024-10-21: qty 1

## 2024-10-21 MED ORDER — PERFLUTREN LIPID MICROSPHERE
1.0000 mL | INTRAVENOUS | Status: AC | PRN
  Administered 2024-10-21: 6 mL via INTRAVENOUS

## 2024-10-21 MED ORDER — LACTATED RINGERS IV BOLUS
1000.0000 mL | Freq: Once | INTRAVENOUS | Status: AC
  Administered 2024-10-21: 1000 mL via INTRAVENOUS

## 2024-10-21 MED ORDER — MIDODRINE HCL 5 MG PO TABS
10.0000 mg | ORAL_TABLET | Freq: Three times a day (TID) | ORAL | Status: DC
  Administered 2024-10-22 – 2024-10-23 (×3): 10 mg
  Filled 2024-10-21 (×3): qty 2

## 2024-10-21 MED ORDER — JUVEN PO PACK
1.0000 | PACK | Freq: Two times a day (BID) | ORAL | Status: AC
  Administered 2024-10-22 – 2024-10-24 (×6): 1
  Filled 2024-10-21 (×6): qty 1

## 2024-10-21 MED ORDER — ORAL CARE MOUTH RINSE: 15.0000 mL | OROMUCOSAL | Status: DC | PRN

## 2024-10-21 MED ORDER — VITAL AF 1.2 CAL PO LIQD: 1000.0000 mL | ORAL | Status: DC

## 2024-10-21 NOTE — Progress Notes (Signed)
 [facility] ADULT ICU REPLACEMENT PROTOCOL   The patient does apply for the [facility] Adult ICU Electrolyte Replacment Protocol based on the criteria listed below:   1.Exclusion criteria: TCTS, ECMO, Dialysis, and Myasthenia Gravis patients 2. Is GFR >/= 30 ml/min? Yes.    Patient's GFR today is >60 3. Is SCr </= 2? Yes.   Patient's SCr is 0.57 mg/dL 4. Did SCr increase >/= 0.5 in 24 hours? No. 5.Pt's weight >40kg  Yes.   6. Abnormal electrolyte(s): k 3.2  7. Electrolytes replaced per protocol 8.  Call MD STAT for K+ </= 2.5, Phos </= 1, or Mag </= 1 Physician:    Nakeia Calvi A 10/21/2024 6:18 AM

## 2024-10-21 NOTE — Progress Notes (Signed)
 Initial Nutrition Assessment  DOCUMENTATION CODES:   Morbid obesity  INTERVENTION:   Recommend Cortrak placement if tube feeds warranted: -Initiate Vital AF 1.2 @ 20 ml/hr, advance by 10 ml every 6 hours to goal rate of 70 ml/hr. -Provides 2016 kcals, 126g protein and 1362 ml H2O -Multivitamin with minerals daily  -1 packet Juven BID via tube, each packet provides 95 calories, 2.5 grams of protein (collagen), and 9.8 grams of carbohydrate (3 grams sugar); also contains 7 grams of L-arginine and L-glutamine, 300 mg vitamin C, 15 mg vitamin E, 1.2 mcg vitamin B-12, 9.5 mg zinc , 200 mg calcium , and 1.5 g  Calcium  Beta-hydroxy-Beta-methylbutyrate to support wound healing   NUTRITION DIAGNOSIS:   Increased nutrient needs related to wound healing as evidenced by estimated needs.  GOAL:   Patient will meet greater than or equal to 90% of their needs  MONITOR:   Vent status  REASON FOR ASSESSMENT:   Consult, Ventilator Wound healing  ASSESSMENT:   59 year old F, [facility], with history of obesity, chronic trach with nocturnal ventilator use, bipolar disorder, DM II, and OSA/OHS who was brought by EMS due to hypotension 70/40 at the facility and more lethargy and confusion.  Patient from [facility]. Chronic trach, on vent nocturnally. Typically able to feed self until recently.  Patient with wounds on buttocks, received wound healing consult. Unable to feed patient currently. Will need Cortrak if tube feeds to be started. Tube feeding recommendations above.  Patient is currently intubated on ventilator support MV: 9.5 L/min Temp (24hrs), Avg:98.3 F (36.8 C), Min:95.7 F (35.4 C), Max:98.8 F (37.1 C) MAP: 82 Now off Levophed .  Admission weight: 382 lbs Current weight: 381 lbs  Medications: KCl  Labs reviewed: CBGs:161-172 Low potassium  NUTRITION - FOCUSED PHYSICAL EXAM:  No depletions noted.  Diet Order:   Diet Order             Diet NPO time  specified  Diet effective now                   EDUCATION NEEDS:   Not appropriate for education at this time  Skin:  Skin Assessment: Skin Integrity Issues: Skin Integrity Issues:: Stage II Stage II: bilateral buttocks  Last BM:  PTA  Height:   Ht Readings from Last 1 Encounters:  05/26/24 5' 6 (1.676 m)    Weight:   Wt Readings from Last 1 Encounters:  10/21/24 (!) 173.2 kg    BMI:  Body mass index is 61.63 kg/m.  Estimated Nutritional Needs:   Kcal:  1850-2150  Protein:  130-145g  Fluid:  2L/day   Morna Lee, MS, RD, LDN Inpatient Clinical Dietitian Contact via Secure chat

## 2024-10-21 NOTE — Progress Notes (Signed)
 Sputum sample obtained and sent to lab at this time.

## 2024-10-21 NOTE — Procedures (Signed)
 Central Venous Catheter Insertion Procedure Note  Kim Jenkins  [id number]  [birth year]  Date:10/21/24  Time:12:39 AM   Provider Performing:[name]   Procedure: Insertion of Non-tunneled Central Venous Catheter(36556) with US  guidance (23062)   Indication(s) Medication administration and Difficult access  Consent Unable to obtain consent due to emergent nature of procedure.  Anesthesia Topical only with 1% lidocaine    Timeout Verified patient identification, verified procedure, site/side was marked, verified correct patient position, special equipment/implants available, medications/allergies/relevant history reviewed, required imaging and test results available.  Sterile Technique Maximal sterile technique including full sterile barrier drape, hand hygiene, sterile gown, sterile gloves, mask, hair covering, sterile ultrasound probe cover (if used).  Procedure Description Area of catheter insertion was cleaned with chlorhexidine  and draped in sterile fashion.  With real-time ultrasound guidance a central venous catheter was placed into the right femoral vein. Nonpulsatile blood flow and easy flushing noted in all ports.  The catheter was sutured in place and sterile dressing applied.  Complications/Tolerance None; patient tolerated the procedure well. Chest X-ray is ordered to verify placement for internal jugular or subclavian cannulation.   Chest x-ray is not ordered for femoral cannulation.  EBL Minimal  Specimen(s) None

## 2024-10-22 ENCOUNTER — Inpatient Hospital Stay (HOSPITAL_COMMUNITY)

## 2024-10-22 DIAGNOSIS — E8809 Other disorders of plasma-protein metabolism, not elsewhere classified: Secondary | ICD-10-CM | POA: Diagnosis not present

## 2024-10-22 DIAGNOSIS — A419 Sepsis, unspecified organism: Secondary | ICD-10-CM | POA: Diagnosis not present

## 2024-10-22 DIAGNOSIS — R601 Generalized edema: Secondary | ICD-10-CM | POA: Diagnosis not present

## 2024-10-22 DIAGNOSIS — R6521 Severe sepsis with septic shock: Secondary | ICD-10-CM | POA: Diagnosis not present

## 2024-10-22 DIAGNOSIS — Z930 Tracheostomy status: Secondary | ICD-10-CM | POA: Diagnosis not present

## 2024-10-22 LAB — COMPREHENSIVE METABOLIC PANEL WITH GFR
ALT: 8 U/L (ref 0–44)
AST: 27 U/L (ref 15–41)
Albumin: <1.5 g/dL — ABNORMAL LOW (ref 3.5–5.0)
Alkaline Phosphatase: 226 U/L — ABNORMAL HIGH (ref 38–126)
Anion gap: 6 (ref 5–15)
BUN: 23 mg/dL — ABNORMAL HIGH (ref 6–20)
CO2: 31 mmol/L (ref 22–32)
Calcium: 7.9 mg/dL — ABNORMAL LOW (ref 8.9–10.3)
Chloride: 98 mmol/L (ref 98–111)
Creatinine, Ser: 0.74 mg/dL (ref 0.44–1.00)
GFR, Estimated: >60 mL/min
Glucose, Bld: 148 mg/dL — ABNORMAL HIGH (ref 70–99)
Potassium: 3.7 mmol/L (ref 3.5–5.1)
Sodium: 135 mmol/L (ref 135–145)
Total Bilirubin: 0.4 mg/dL (ref 0.0–1.2)
Total Protein: 6.1 g/dL — ABNORMAL LOW (ref 6.5–8.1)

## 2024-10-22 LAB — GLUCOSE, CAPILLARY
Glucose-Capillary: 157 mg/dL — ABNORMAL HIGH (ref 70–99)
Glucose-Capillary: 140 mg/dL — ABNORMAL HIGH (ref 70–99)
Glucose-Capillary: 137 mg/dL — ABNORMAL HIGH (ref 70–99)
Glucose-Capillary: 124 mg/dL — ABNORMAL HIGH (ref 70–99)
Glucose-Capillary: 114 mg/dL — ABNORMAL HIGH (ref 70–99)
Glucose-Capillary: 141 mg/dL — ABNORMAL HIGH (ref 70–99)

## 2024-10-22 LAB — CBC WITH DIFFERENTIAL/PLATELET
Abs Immature Granulocytes: 0.15 10*3/uL — ABNORMAL HIGH (ref 0.00–0.07)
Basophils Absolute: 0 10*3/uL (ref 0.0–0.1)
Basophils Relative: 0 %
Eosinophils Absolute: 0 10*3/uL (ref 0.0–0.5)
Eosinophils Relative: 0 %
HCT: 36.6 % (ref 36.0–46.0)
Hemoglobin: 11.4 g/dL — ABNORMAL LOW (ref 12.0–15.0)
Immature Granulocytes: 1 %
Lymphocytes Relative: 13 %
Lymphs Abs: 1.5 10*3/uL (ref 0.7–4.0)
MCH: 30.9 pg (ref 26.0–34.0)
MCHC: 31.1 g/dL (ref 30.0–36.0)
MCV: 99.2 fL (ref 80.0–100.0)
Monocytes Absolute: 1 10*3/uL (ref 0.1–1.0)
Monocytes Relative: 8 %
Neutro Abs: 9.3 10*3/uL — ABNORMAL HIGH (ref 1.7–7.7)
Neutrophils Relative %: 78 %
Platelets: 238 10*3/uL (ref 150–400)
RBC: 3.69 MIL/uL — ABNORMAL LOW (ref 3.87–5.11)
RDW: 16.5 % — ABNORMAL HIGH (ref 11.5–15.5)
WBC: 12 10*3/uL — ABNORMAL HIGH (ref 4.0–10.5)
nRBC: 0.2 % (ref 0.0–0.2)

## 2024-10-22 LAB — MAGNESIUM: Magnesium: 1.8 mg/dL (ref 1.7–2.4)

## 2024-10-22 LAB — PHOSPHORUS: Phosphorus: 4.2 mg/dL (ref 2.5–4.6)

## 2024-10-22 MED ORDER — POLYETHYLENE GLYCOL 3350 17 G PO PACK: 17.0000 g | PACK | Freq: Every day | ORAL | Status: AC | PRN

## 2024-10-22 MED ORDER — VITAL AF 1.2 CAL PO LIQD
1000.0000 mL | ORAL | Status: AC
  Administered 2024-10-22 – 2024-10-23 (×2): 1000 mL

## 2024-10-22 MED ORDER — SENNA 8.6 MG PO TABS: 1.0000 | ORAL_TABLET | Freq: Two times a day (BID) | ORAL | Status: AC | PRN

## 2024-10-22 MED ORDER — MUPIROCIN 2 % EX OINT
1.0000 | TOPICAL_OINTMENT | Freq: Two times a day (BID) | CUTANEOUS | Status: AC
  Administered 2024-10-22 – 2024-10-24 (×5): 1 via NASAL
  Filled 2024-10-22: qty 22

## 2024-10-22 MED ORDER — VALPROIC ACID 250 MG PO CAPS
500.0000 mg | ORAL_CAPSULE | Freq: Two times a day (BID) | ORAL | Status: DC
  Administered 2024-10-22: 500 mg via ORAL
  Filled 2024-10-22 (×2): qty 2

## 2024-10-22 MED ORDER — FUROSEMIDE 10 MG/ML IJ SOLN
40.0000 mg | Freq: Once | INTRAMUSCULAR | Status: AC
  Administered 2024-10-22: 40 mg via INTRAVENOUS
  Filled 2024-10-22: qty 4

## 2024-10-22 MED ORDER — VALPROIC ACID 250 MG/5ML PO SOLN
500.0000 mg | Freq: Two times a day (BID) | ORAL | Status: AC
  Administered 2024-10-22 – 2024-10-24 (×5): 500 mg
  Filled 2024-10-22 (×5): qty 10

## 2024-10-22 MED ORDER — FUROSEMIDE 10 MG/ML IJ SOLN
40.0000 mg | Freq: Two times a day (BID) | INTRAMUSCULAR | Status: DC
  Administered 2024-10-22 – 2024-10-23 (×2): 40 mg via INTRAVENOUS
  Filled 2024-10-22 (×2): qty 4

## 2024-10-22 MED ORDER — FUROSEMIDE 10 MG/ML IJ SOLN: 40.0000 mg | Freq: Once | INTRAMUSCULAR | Status: DC

## 2024-10-22 MED ORDER — ADULT MULTIVITAMIN W/MINERALS CH
1.0000 | ORAL_TABLET | Freq: Every day | ORAL | Status: AC
  Administered 2024-10-23 – 2024-10-24 (×2): 1
  Filled 2024-10-22 (×2): qty 1

## 2024-10-22 NOTE — Progress Notes (Signed)
 Nutrition Follow-up  DOCUMENTATION CODES:   Morbid obesity  INTERVENTION:  - Starting tube feeds today via Cortrak (tip in distal stomach): Vital 1.2 at 70 ml/h (1680 ml per day) *Start at 56mL/hr and advance by 10 ml every 6 hours Provides 2016 kcal, 126 gm protein, 1362 ml free water daily  -Multivitamin with minerals daily   -1 packet Juven BID via tube, each packet provides 95 calories, 2.5 grams of protein (collagen), and 9.8 grams of carbohydrate (3 grams sugar); also contains 7 grams of L-arginine and L-glutamine, 300 mg vitamin C, 15 mg vitamin E, 1.2 mcg vitamin B-12, 9.5 mg zinc  to support wound healing   NUTRITION DIAGNOSIS:   Increased nutrient needs related to wound healing as evidenced by estimated needs. *ongoing  GOAL:   Patient will meet greater than or equal to 90% of their needs *progressing, starting TF  MONITOR:   Vent status  REASON FOR ASSESSMENT:   Consult, Ventilator Wound healing  ASSESSMENT:   59 year old F, [facility], with history of obesity, chronic trach with nocturnal ventilator use, bipolar disorder, DM II, and OSA/OHS who was brought by EMS due to hypotension 70/40 at the facility and more lethargy and confusion.  2/2 Admit 2/3 Cortrak placed (tip at distal stomach but tube coiled) 2/4 Cortrak adjusted (tip at distal stomach)  Patient trached to the vent. MV: 9.3 L/min Temp (24hrs), Avg:97.5 F (36.4 C), Min:96.8 F (36 C), Max:98.2 F (36.8 C)  Patient awake on the vent at time of visit.   Charge RN working to [employer] tube back at time of visit due to it being coiled in the stomach. New xray read this afternoon shows tip at the distal stomach.   Tube feed orders placed yesterday but tube feed never started. Discussed with CCM, can initiate tube feeds today. Will continue Juven for wound healing.    Admit weight: 382# Current weight: 381# I&O's: +7L since admit  Medications reviewed and include:  MVI  Labs reviewed:  -  NUTRITION - FOCUSED PHYSICAL EXAM:  Flowsheet Row Most Recent Value  Orbital Region No depletion  Upper Arm Region No depletion  Thoracic and Lumbar Region No depletion  Buccal Region No depletion  Temple Region No depletion  Clavicle Bone Region No depletion  Clavicle and Acromion Bone Region No depletion  Scapular Bone Region Unable to assess  Dorsal Hand No depletion  Patellar Region No depletion  Anterior Thigh Region No depletion  Posterior Calf Region No depletion  Edema (RD Assessment) Moderate  [BLEs and BUEs]  Hair Reviewed  Eyes Unable to assess  Mouth Unable to assess  Skin Reviewed  Nails Reviewed    Diet Order:   Diet Order             Diet NPO time specified  Diet effective now                   EDUCATION NEEDS:  Not appropriate for education at this time  Skin:  Skin Assessment: Skin Integrity Issues: Skin Integrity Issues:: Stage II Stage II: bilateral buttocks  Last BM:  PTA  Height:  Ht Readings from Last 1 Encounters:  10/22/24 5' 6 (1.676 m)   Weight:  Wt Readings from Last 1 Encounters:  10/22/24 (!) 173.2 kg   Ideal Body Weight:  59.09 kg  BMI:  Body mass index is 61.63 kg/m.  Estimated Nutritional Needs:  Kcal:  1850-2150 Protein:  130-145g Fluid:  2L/day  Trude Ned RD, LDN Contact via [employer].

## 2024-10-22 NOTE — Progress Notes (Signed)
 "  NAME:  Kim Jenkins, MRN:  [id number], DOB:  [birth year], LOS: 2 ADMISSION DATE:  10/20/2024,   History of Present Illness:  59 year old F, [facility], with history of obesity, chronic trach with nocturnal ventilator use, bipolar disorder, DM II, and OSA/OHS who was brought by EMS due to hypotension 70/40 at the facility and more lethargy and confusion. Patient was admitted on 05/30/2024 - purpura, aspiration pneumonia RLL.  On the ER patient was found soft blood pressure after receiving 2L of LR. Her labs were, VBG pH 7.44, co2 51, CMP unremarkable, CBC with WBC 15, normal glucose 125, neg RVP, UA was moderate leukocytes +nitrates, >50 WBC, blood cx and urine cx pending. CXR showed mild bibasilar atelectasis, R>L.  PCCM was consulted for hypotension. Pt got 2L and vanc+zosyn . During my encounter, patient is lethargic, no able to respond to questions, just to painful stimulation. On PRVC. She has MAP 55-60, after 2 L. She has anasarca with R and L upper extremity erythema.    Pertinent  Medical History   Past Medical History:  Diagnosis Date   Bipolar 1 disorder (HCC)    Chronic pain    Depression    Diabetes mellitus (HCC)    Hypertension    Kidney stones    Morbid obesity (HCC)    Obesity hypoventilation syndrome (HCC)    Obstructive sleep apnea    Panniculitis      Significant Hospital Events: Including procedures, antibiotic start and stop dates in addition to other pertinent events   2/1: Admitted to ICU with encephalopathy and hypotension c/f septic shock. 2/2: Ucx w/ GNRs. 2/4: Ucx growing ESBL K.pneumoniae, though S to Zosyn . Patient has clinically improved on Zosyn .  Interim History / Subjective:  Patient is more interactive this AM. Opens eyes to voice. Squeezes hand to command. Nods head when asked questions. Levophed  off this AM. Mechanically ventilated - controlled mode.  Objective    Blood pressure 104/62, pulse 99, temperature (!) 97.5 F (36.4 C),  resp. rate 17, height 5' 6 (1.676 m), weight (!) 173.2 kg, SpO2 98%.    Vent Mode: PRVC FiO2 (%):  [50 %-60 %] 50 % Set Rate:  [16 bmp] 16 bmp Vt Set:  [400 mL-500 mL] 400 mL PEEP:  [8 cmH20] 8 cmH20 Plateau Pressure:  [18 cmH20-22 cmH20] 22 cmH20   Intake/Output Summary (Last 24 hours) at 10/22/2024 0908 Last data filed at 10/22/2024 0758 Gross per 24 hour  Intake 2941.27 ml  Output 300 ml  Net 2641.27 ml   Filed Weights   10/20/24 1818 10/21/24 0436 10/22/24 0449  Weight: (!) 173.7 kg (!) 173.2 kg (!) 173.2 kg    Examination: Constitutional:      Appearance: She is chronically ill-appearing.  Lethargic, morbid obese with trach on place.   HENT:     Head: Normocephalic.  Cardiovascular:     Rate and Rhythm: Normal rate. Rhythm irregular.  Pulmonary:     Effort: Pulmonary effort is normal.     Breath sounds: Normal breath sounds.     Comments: No acute resp distress. Trach on the vent. Abdominal:     Comments: Some tenderness with palpation in the lower abdomen. Obese.   Skin:    Comments: Anasarca. Bilateral upper extremities with erythema.  Neurological:     Comments: Opens eyes to voice. Squeezes hand to command. Nods head when asked questions.     Resolved problem list   Assessment and Plan   Septic  shock due to urinary source ESBL K. pneumoniae UTI Off pressors Continue midodrine  10 mg Q8H though likely can wean off in next day or two Continue zosyn  for 7 day course  Chronic trach  OSA/OHS - morbid obese  Chronic trach with nocturnal ventilator. pH7.4, CO2 51. CXR showing mild bibasilar atelectasis R>L.  Prior history of pansensitive pseudomonas on trach aspirate in 2023. On vent PRVC - 50%, peep 8, tv 500, RR 16.  Hypoalbuminemia Anasarca Initiate lasix  today.  Endo Normal glucose - CBG checks Q4H  GI Con't tube feeds  Skin Erythema on the upper extremities with edema.   Best Practice (right click and Reselect all SmartList Selections daily)    Diet/type: tubefeeds DVT prophylaxis LMWH Pressure ulcer(s): N/A GI prophylaxis: N/A Lines: Central line Foley:  N/A Code Status:  full code  Labs   CBC: Recent Labs  Lab 10/20/24 1722 10/21/24 0428 10/22/24 0444  WBC 15.4* 20.7* 12.0*  NEUTROABS 10.9* 18.6* 9.3*  HGB 11.6* 12.4 11.4*  HCT 38.2 41.2 36.6  MCV 99.2 100.5* 99.2  PLT 252 353 238    Basic Metabolic Panel: Recent Labs  Lab 10/20/24 1722 10/21/24 0428 10/21/24 1838 10/22/24 0444  NA 137 136  --  135  K 3.3* 3.2*  --  3.7  CL 98 98  --  98  CO2 32 28  --  31  GLUCOSE 125* 183*  --  148*  BUN 19 19  --  23*  CREATININE 0.58 0.57  --  0.74  CALCIUM  7.8* 7.6*  --  7.9*  MG  --   --  1.7 1.8  PHOS  --   --  4.4 4.2   GFR: Estimated Creatinine Clearance: 123.8 mL/min (by C-G formula based on SCr of 0.74 mg/dL). Recent Labs  Lab 10/20/24 1722 10/20/24 1758 10/20/24 1928 10/21/24 0428 10/22/24 0444  PROCALCITON  --   --   --  0.47  --   WBC 15.4*  --   --  20.7* 12.0*  LATICACIDVEN  --  1.8 2.1* 2.1*  --     Liver Function Tests: Recent Labs  Lab 10/20/24 1722 10/21/24 0428 10/22/24 0444  AST 33 31 27  ALT 10 11 8   ALKPHOS 231* 238* 226*  BILITOT 0.5 0.5 0.4  PROT 5.8* 6.3* 6.1*  ALBUMIN  <1.5* 1.5* <1.5*   No results for input(s): LIPASE, AMYLASE in the last 168 hours. No results for input(s): AMMONIA in the last 168 hours.  ABG    Component Value Date/Time   PHART 7.345 (L) 10/25/2022 2355   PCO2ART 69.4 (HH) 10/25/2022 2355   PO2ART 60 (L) 10/25/2022 2355   HCO3 26.6 10/21/2024 0605   TCO2 40 (H) 10/25/2022 2355   O2SAT 92.6 10/21/2024 0605     Coagulation Profile: Recent Labs  Lab 10/20/24 1759  INR 1.4*    Cardiac Enzymes: No results for input(s): CKTOTAL, CKMB, CKMBINDEX, TROPONINI in the last 168 hours.  HbA1C: Hgb A1c MFr Bld  Date/Time Value Ref Range Status  10/26/2022 01:57 AM 5.4 4.8 - 5.6 % Final    Comment:    (NOTE) Pre diabetes:           5.7%-6.4%  Diabetes:              >6.4%  Glycemic control for   <7.0% adults with diabetes   12/05/2019 06:15 AM 5.2 4.8 - 5.6 % Final    Comment:    (NOTE) Pre diabetes:  5.7%-6.4% Diabetes:              >6.4% Glycemic control for   <7.0% adults with diabetes     CBG: Recent Labs  Lab 10/21/24 1217 10/21/24 1609 10/21/24 2119 10/22/24 0454 10/22/24 0747  GLUCAP 161* 162* 148* 157* 140*    Review of Systems:   As above  Past Medical History:  She,  has a past medical history of Bipolar 1 disorder (HCC), Chronic pain, Depression, Diabetes mellitus (HCC), Hypertension, Kidney stones, Morbid obesity (HCC), Obesity hypoventilation syndrome (HCC), Obstructive sleep apnea, and Panniculitis.   Surgical History:   Past Surgical History:  Procedure Laterality Date   ESOPHAGOGASTRODUODENOSCOPY N/A 05/06/2018   Procedure: ESOPHAGOGASTRODUODENOSCOPY (EGD);  Surgeon: Wilhelmenia Aloha Raddle., MD;  Location: [facility] ENDOSCOPY;  Service: Gastroenterology;  Laterality: N/A;   RIGHT OOPHORECTOMY     TRACHEOSTOMY     VENTRAL HERNIA REPAIR       Social History:   reports that she has never smoked. She has never used smokeless tobacco. She reports that she does not drink alcohol  and does not use drugs.   Family History:  Her family history is not on file.   Allergies Allergies[1]   Home Medications  Prior to Admission medications  Medication Sig Start Date End Date Taking? Authorizing Provider  acetaminophen  (TYLENOL ) 325 MG tablet Take 650 mg by mouth every 6 (six) hours as needed for mild pain (pain score 1-3).   Yes [provider]  aspirin  81 MG chewable tablet Chew 81 mg by mouth daily.   Yes [provider]  famotidine  (PEPCID ) 40 MG tablet Take 40 mg by mouth every evening.   Yes [provider]  gabapentin  (NEURONTIN ) 600 MG tablet Take 600 mg by mouth every 12 (twelve) hours.   Yes [provider]  hydrALAZINE  (APRESOLINE ) 50  MG tablet Take 50 mg by mouth every 6 (six) hours as needed (SBP > 160 and DPB > 100).   Yes [provider]  ipratropium-albuterol  (DUONEB) 0.5-2.5 (3) MG/3ML SOLN Take 3 mLs by nebulization every 6 (six) hours as needed (for shortness of breath).   Yes [provider]  lidocaine  (LIDODERM ) 5 % Place 1 patch onto the skin daily. 09/23/24  Yes [provider]  loperamide (IMODIUM A-D) 2 MG tablet Take 2 mg by mouth every 6 (six) hours as needed for diarrhea or loose stools.    Yes [provider]  metformin (FORTAMET) 500 MG (OSM) 24 hr tablet Take 500 mg by mouth in the morning.   Yes [provider]  metFORMIN (GLUCOPHAGE-XR) 500 MG 24 hr tablet Take 500 mg by mouth daily. 10/01/24  Yes [provider]  Multiple Vitamin (MULTIVITAMIN WITH MINERALS) TABS tablet Take 1 tablet by mouth daily.   Yes [provider]  nitroGLYCERIN (NITROSTAT) 0.4 MG SL tablet Place 0.4 mg under the tongue every 5 (five) minutes x 3 doses as needed for chest pain (AND CALL PROVIDER).    Yes [provider]  omeprazole (PRILOSEC) 40 MG capsule Take 40 mg by mouth every morning. 08/12/22  Yes [provider]  ondansetron  (ZOFRAN -ODT) 4 MG disintegrating tablet Take 4 mg by mouth every 6 (six) hours as needed for nausea or vomiting (DISSOLVE ORALLY).   Yes [provider]  oxyCODONE  (OXY IR/ROXICODONE ) 5 MG immediate release tablet Take 2.5 mg by mouth every 8 (eight) hours as needed for moderate pain (pain score 4-6). Per MAR it is 2.5 MG as needed every  8 hours. 10/01/24  Yes [provider]  Polyethyl Glyc-Propyl Glyc PF (SYSTANE HYDRATION PF) 0.4-0.3 % SOLN Place 1 drop into both eyes every 6 (six) hours.   Yes [provider]  polyethylene glycol (MIRALAX  / GLYCOLAX ) 17 g packet Take 17 g by mouth every 12 (twelve) hours as needed for mild constipation.   Yes [provider]  pravastatin (PRAVACHOL) 20 MG  tablet Take 20 mg by mouth daily.   Yes [provider]  Valproate Sodium  (VALPROIC  ACID) 500 MG/10ML SOLN Take 10 mLs by mouth 2 (two) times daily.   Yes [provider]  venlafaxine  XR (EFFEXOR -XR) 75 MG 24 hr capsule Take 75 mg by mouth every morning.   Yes [provider]  VRAYLAR  4.5 MG CAPS Take 4.5 mg by mouth every morning. 08/16/22  Yes [provider]  amLODipine (NORVASC) 5 MG tablet Take 5 mg by mouth daily.    [provider]  divalproex  (DEPAKOTE  ER) 500 MG 24 hr tablet Take 2 tablets (1,000 mg total) by mouth every 12 (twelve) hours. 09/13/18   Ward, Ami Copes, PA-C  fentaNYL  (DURAGESIC ) 25 MCG/HR Place 1 patch onto the skin every 3 (three) days. 08/26/24   Fleeta Valeria Mayo, MD  FLUoxetine  (PROZAC ) 20 MG capsule Take 20 mg by mouth every morning. 08/25/22   [provider]  silver  sulfADIAZINE  (SILVADENE ) 1 % cream Apply 1 Application topically daily as needed (pain, itching). 05/26/24   Kammerer, Megan L, DO  sodium chloride  (OCEAN) 0.65 % SOLN nasal spray Place 2 sprays into both nostrils 2 (two) times daily as needed for congestion.    [provider]  triamcinolone  cream (KENALOG ) 0.1 % Apply 1 Application topically 2 (two) times daily as needed (pain, itching). Apply everywhere but face 05/26/24   Gennaro Duwaine CROME, DO      Total critical care time spent by me: 35 minutes   Critical care time was exclusive of separately billable procedures and the treatment of any other patient.   Critical care was necessary to treat or prevent imminent or life-threatening deterioration.  Critical care was time spent personally by me on the following activities: development of treatment plan with patient and/or surrogate as well as nursing, discussions with consultants, re-evaluation of the patient's condition and their response to treatment, examination of patient, obtaining history from patient or surrogate, ordering and performing  treatments and interventions, ordering and review of laboratory studies, ordering and review of radiographic studies, and participation in multidisciplinary rounds.  Lamar Dales, MD Pulmonary, Critical Care & Sleep Medicine [facility]  7a-7p: For contact information, see AMION. If no response to pager, call PCCM Consults pager. After 7p: Call E-Link.            [1]  Allergies Allergen Reactions   Meperidine And Related Shortness Of Breath and Other (See Comments)    Allergic, per MAR   Cefazolin Other (See Comments)    Allergic, per [facility]   "

## 2024-10-22 NOTE — Plan of Care (Signed)
 ?  Problem: Clinical Measurements: ?Goal: Will remain free from infection ?Outcome: Progressing ?  ?Problem: Clinical Measurements: ?Goal: Diagnostic test results will improve ?Outcome: Progressing ?  ?

## 2024-10-23 ENCOUNTER — Encounter (HOSPITAL_COMMUNITY): Payer: Self-pay

## 2024-10-23 LAB — COMPREHENSIVE METABOLIC PANEL WITH GFR
ALT: 13 U/L (ref 0–44)
AST: 32 U/L (ref 15–41)
Albumin: 1.6 g/dL — ABNORMAL LOW (ref 3.5–5.0)
Alkaline Phosphatase: 252 U/L — ABNORMAL HIGH (ref 38–126)
Anion gap: 7 (ref 5–15)
BUN: 29 mg/dL — ABNORMAL HIGH (ref 6–20)
CO2: 32 mmol/L (ref 22–32)
Calcium: 7.9 mg/dL — ABNORMAL LOW (ref 8.9–10.3)
Chloride: 98 mmol/L (ref 98–111)
Creatinine, Ser: 0.76 mg/dL (ref 0.44–1.00)
GFR, Estimated: >60 mL/min
Glucose, Bld: 169 mg/dL — ABNORMAL HIGH (ref 70–99)
Potassium: 3.6 mmol/L (ref 3.5–5.1)
Sodium: 137 mmol/L (ref 135–145)
Total Bilirubin: 0.4 mg/dL (ref 0.0–1.2)
Total Protein: 6 g/dL — ABNORMAL LOW (ref 6.5–8.1)

## 2024-10-23 LAB — CBC WITH DIFFERENTIAL/PLATELET
Abs Immature Granulocytes: 0.15 10*3/uL — ABNORMAL HIGH (ref 0.00–0.07)
Basophils Absolute: 0 10*3/uL (ref 0.0–0.1)
Basophils Relative: 0 %
Eosinophils Absolute: 0 10*3/uL (ref 0.0–0.5)
Eosinophils Relative: 0 %
HCT: 35.8 % — ABNORMAL LOW (ref 36.0–46.0)
Hemoglobin: 10.8 g/dL — ABNORMAL LOW (ref 12.0–15.0)
Immature Granulocytes: 1 %
Lymphocytes Relative: 14 %
Lymphs Abs: 1.6 10*3/uL (ref 0.7–4.0)
MCH: 30.3 pg (ref 26.0–34.0)
MCHC: 30.2 g/dL (ref 30.0–36.0)
MCV: 100.3 fL — ABNORMAL HIGH (ref 80.0–100.0)
Monocytes Absolute: 1.2 10*3/uL — ABNORMAL HIGH (ref 0.1–1.0)
Monocytes Relative: 10 %
Neutro Abs: 8.6 10*3/uL — ABNORMAL HIGH (ref 1.7–7.7)
Neutrophils Relative %: 75 %
Platelets: 180 10*3/uL (ref 150–400)
RBC: 3.57 MIL/uL — ABNORMAL LOW (ref 3.87–5.11)
RDW: 16.7 % — ABNORMAL HIGH (ref 11.5–15.5)
WBC: 11.5 10*3/uL — ABNORMAL HIGH (ref 4.0–10.5)
nRBC: 0 % (ref 0.0–0.2)

## 2024-10-23 LAB — URINE CULTURE: Culture: >=100000 — AB

## 2024-10-23 LAB — GLUCOSE, CAPILLARY
Glucose-Capillary: 133 mg/dL — ABNORMAL HIGH (ref 70–99)
Glucose-Capillary: 139 mg/dL — ABNORMAL HIGH (ref 70–99)
Glucose-Capillary: 144 mg/dL — ABNORMAL HIGH (ref 70–99)
Glucose-Capillary: 150 mg/dL — ABNORMAL HIGH (ref 70–99)
Glucose-Capillary: 168 mg/dL — ABNORMAL HIGH (ref 70–99)
Glucose-Capillary: 172 mg/dL — ABNORMAL HIGH (ref 70–99)
Glucose-Capillary: 174 mg/dL — ABNORMAL HIGH (ref 70–99)

## 2024-10-23 LAB — BASIC METABOLIC PANEL WITH GFR
Anion gap: 8 (ref 5–15)
BUN: 29 mg/dL — ABNORMAL HIGH (ref 6–20)
CO2: 31 mmol/L (ref 22–32)
Calcium: 7.7 mg/dL — ABNORMAL LOW (ref 8.9–10.3)
Chloride: 97 mmol/L — ABNORMAL LOW (ref 98–111)
Creatinine, Ser: 0.77 mg/dL (ref 0.44–1.00)
GFR, Estimated: >60 mL/min
Glucose, Bld: 144 mg/dL — ABNORMAL HIGH (ref 70–99)
Potassium: 3.1 mmol/L — ABNORMAL LOW (ref 3.5–5.1)
Sodium: 136 mmol/L (ref 135–145)

## 2024-10-23 LAB — MAGNESIUM
Magnesium: 1.7 mg/dL (ref 1.7–2.4)
Magnesium: 2 mg/dL (ref 1.7–2.4)

## 2024-10-23 LAB — LEGIONELLA PNEUMOPHILA SEROGP 1 UR AG: L. pneumophila Serogp 1 Ur Ag: NEGATIVE

## 2024-10-23 LAB — PHOSPHORUS: Phosphorus: 3.3 mg/dL (ref 2.5–4.6)

## 2024-10-23 MED ORDER — VENLAFAXINE HCL 37.5 MG PO TABS
37.5000 mg | ORAL_TABLET | Freq: Two times a day (BID) | ORAL | Status: AC
  Administered 2024-10-23 – 2024-10-24 (×4): 37.5 mg
  Filled 2024-10-23 (×4): qty 1

## 2024-10-23 MED ORDER — ASPIRIN 81 MG PO CHEW
81.0000 mg | CHEWABLE_TABLET | Freq: Every day | ORAL | Status: AC
  Administered 2024-10-23 – 2024-10-24 (×2): 81 mg
  Filled 2024-10-23 (×2): qty 1

## 2024-10-23 MED ORDER — MIDODRINE HCL 5 MG PO TABS: 15.0000 mg | ORAL_TABLET | Freq: Three times a day (TID) | ORAL | Status: DC

## 2024-10-23 MED ORDER — IPRATROPIUM-ALBUTEROL 0.5-2.5 (3) MG/3ML IN SOLN: 3.0000 mL | Freq: Four times a day (QID) | RESPIRATORY_TRACT | Status: AC | PRN

## 2024-10-23 MED ORDER — POLYVINYL ALCOHOL 1.4 % OP SOLN
1.0000 [drp] | Freq: Four times a day (QID) | OPHTHALMIC | Status: AC
  Administered 2024-10-23 – 2024-10-24 (×4): 1 [drp] via OPHTHALMIC
  Filled 2024-10-23: qty 15

## 2024-10-23 MED ORDER — FAMOTIDINE 20 MG PO TABS
40.0000 mg | ORAL_TABLET | Freq: Every day | ORAL | Status: AC
  Administered 2024-10-23 – 2024-10-24 (×2): 40 mg
  Filled 2024-10-23 (×2): qty 2

## 2024-10-23 MED ORDER — GABAPENTIN 250 MG/5ML PO SOLN
600.0000 mg | Freq: Two times a day (BID) | ORAL | Status: AC
  Administered 2024-10-23 – 2024-10-24 (×3): 600 mg
  Filled 2024-10-23 (×4): qty 12

## 2024-10-23 MED ORDER — SODIUM CHLORIDE 0.9 % IV SOLN: 250.0000 mL | INTRAVENOUS | Status: AC

## 2024-10-23 MED ORDER — MIDODRINE HCL 5 MG PO TABS
15.0000 mg | ORAL_TABLET | Freq: Once | ORAL | Status: AC
  Administered 2024-10-23: 15 mg
  Filled 2024-10-23: qty 3

## 2024-10-23 MED ORDER — POTASSIUM CHLORIDE 10 MEQ/50ML IV SOLN
10.0000 meq | INTRAVENOUS | Status: AC
  Administered 2024-10-23 (×4): 10 meq via INTRAVENOUS
  Filled 2024-10-23 (×4): qty 50

## 2024-10-23 MED ORDER — MIDODRINE HCL 5 MG PO TABS
15.0000 mg | ORAL_TABLET | Freq: Three times a day (TID) | ORAL | Status: AC
  Administered 2024-10-23 – 2024-10-24 (×5): 15 mg
  Filled 2024-10-23 (×5): qty 3

## 2024-10-23 MED ORDER — ALBUMIN HUMAN 25 % IV SOLN
25.0000 g | Freq: Once | INTRAVENOUS | Status: AC
  Administered 2024-10-23: 12.5 g via INTRAVENOUS
  Filled 2024-10-23: qty 100

## 2024-10-23 MED ORDER — PHENYLEPHRINE HCL-NACL 20-0.9 MG/250ML-% IV SOLN: 25.0000 ug/min | INTRAVENOUS | Status: DC

## 2024-10-23 MED ORDER — MAGNESIUM SULFATE 2 GM/50ML IV SOLN
2.0000 g | Freq: Once | INTRAVENOUS | Status: AC
  Administered 2024-10-23: 2 g via INTRAVENOUS
  Filled 2024-10-23: qty 50

## 2024-10-23 MED ORDER — POLYETHYL GLYC-PROPYL GLYC PF 0.4-0.3 % OP SOLN: 1.0000 [drp] | Freq: Four times a day (QID) | OPHTHALMIC | Status: DC

## 2024-10-23 MED ORDER — FLUOXETINE HCL 20 MG PO CAPS
20.0000 mg | ORAL_CAPSULE | Freq: Every day | ORAL | Status: AC
  Administered 2024-10-23 – 2024-10-24 (×2): 20 mg
  Filled 2024-10-23 (×2): qty 1

## 2024-10-23 MED ORDER — POTASSIUM CHLORIDE 20 MEQ PO PACK
20.0000 meq | PACK | ORAL | Status: AC
  Administered 2024-10-23 (×2): 20 meq
  Filled 2024-10-23 (×2): qty 1

## 2024-10-23 MED ORDER — INSULIN ASPART 100 UNIT/ML IJ SOLN
0.0000 [IU] | INTRAMUSCULAR | Status: AC
  Administered 2024-10-23: 4 [IU] via SUBCUTANEOUS
  Administered 2024-10-23 (×2): 3 [IU] via SUBCUTANEOUS
  Administered 2024-10-24 (×2): 4 [IU] via SUBCUTANEOUS
  Administered 2024-10-24 (×3): 3 [IU] via SUBCUTANEOUS
  Filled 2024-10-23 (×4): qty 3
  Filled 2024-10-23: qty 4
  Filled 2024-10-23 (×2): qty 3
  Filled 2024-10-23: qty 4

## 2024-10-23 NOTE — Progress Notes (Signed)
 eLink Physician-Brief Progress Note Patient Name: Kim Jenkins DOB: 1965/10/05 MRN: [id number]   Date of Service  10/23/2024  HPI/Events of Note  BP 89/50, MAP 62 despite Midodrine , patient was however asleep and BP improves with wakefulness.  eICU Interventions  Peripheral Neo placed on the [facility] for sustained MAP < 60.        Marcellina PENNER [name] 10/23/2024, 1:07 AM

## 2024-10-23 NOTE — Progress Notes (Signed)
 " PROGRESS NOTE    Kim Jenkins  FMW:[id number] DOB: February 20, 1966 DOA: 10/20/2024 PCP: Fleeta Valeria Mayo, MD  Subjective: No new subjective & objective note has been filed under this hospital service since the last note was generated.    Hospital Course: No notes on file   Assessment and Plan:   Sepsis due to UTI            -chronic trach/peg            -Ucx with e coli/kleb pneumoniae, I suspect these are the organisms responsible for sepsis.             Brendan with MRSA, pseudomonas, I suspect this represents colonization.             -Bcx sterile to date  - Zosyn  10/21/2024-  2.    OSA/OHS  [place] Alcide and currently on the vent  3.    Bipolar d/o  - Continue usual meds Depakene , also on Effexor  and Prozac  4.    DMII  - Sliding scale insulin  and hold home meds     DVT prophylaxis: SCDs Start: 10/20/24 2006     Code Status: Full Code Family Communication: No family at the bedside Disposition Plan: To long-term care at [facility] Reason for continuing need for hospitalization: Follow-up cultures  Objective: Vitals:   10/23/24 0415 10/23/24 0430 10/23/24 0445 10/23/24 0500  BP:      Pulse: 88  89 99  Resp: 19 19 19  (!) 23  Temp: 97.7 F (36.5 C) 97.7 F (36.5 C) 97.7 F (36.5 C) 97.7 F (36.5 C)  TempSrc:      SpO2: 96%  99% 98%  Weight:    (!) 172 kg  Height:        Intake/Output Summary (Last 24 hours) at 10/23/2024 0719 Last data filed at 10/23/2024 9461 Gross per 24 hour  Intake 762.37 ml  Output 1600 ml  Net -837.63 ml   Filed Weights   10/21/24 0436 10/22/24 0449 10/23/24 0500  Weight: (!) 173.2 kg (!) 173.2 kg (!) 172 kg    Examination:  Morbidly obese with trach on vent HEENT: No scleral icterus unable to appreciate JVD, does not track reliably with eyes, trach is clean dry and intact CV: Regular rate and rhythm distant heart tones no murmurs rubs gallop Lungs: Poor air entry no adventitial sounds Abdomen: Morbidly obese but soft Extremities: Chronic  appearing lymphedema Neurologic: Intermittently will engage with the examiner she even mouth a few words prefers to sleep during my exam Data Reviewed: I have personally reviewed following labs and imaging studies  CBC: Recent Labs  Lab 10/20/24 1722 10/21/24 0428 10/22/24 0444  WBC 15.4* 20.7* 12.0*  NEUTROABS 10.9* 18.6* 9.3*  HGB 11.6* 12.4 11.4*  HCT 38.2 41.2 36.6  MCV 99.2 100.5* 99.2  PLT 252 353 238   Basic Metabolic Panel: Recent Labs  Lab 10/20/24 1722 10/21/24 0428 10/21/24 1838 10/22/24 0444 10/22/24 2248  NA 137 136  --  135 136  K 3.3* 3.2*  --  3.7 3.1*  CL 98 98  --  98 97*  CO2 32 28  --  31 31  GLUCOSE 125* 183*  --  148* 144*  BUN 19 19  --  23* 29*  CREATININE 0.58 0.57  --  0.74 0.77  CALCIUM  7.8* 7.6*  --  7.9* 7.7*  MG  --   --  1.7 1.8 1.7  PHOS  --   --  4.4  4.2  --    GFR: Estimated Creatinine Clearance: 123.3 mL/min (by C-G formula based on SCr of 0.77 mg/dL). Liver Function Tests: Recent Labs  Lab 10/20/24 1722 10/21/24 0428 10/22/24 0444  AST 33 31 27  ALT 10 11 8   ALKPHOS 231* 238* 226*  BILITOT 0.5 0.5 0.4  PROT 5.8* 6.3* 6.1*  ALBUMIN  <1.5* 1.5* <1.5*   No results for input(s): LIPASE, AMYLASE in the last 168 hours. No results for input(s): AMMONIA in the last 168 hours. Coagulation Profile: Recent Labs  Lab 10/20/24 1759  INR 1.4*   Cardiac Enzymes: No results for input(s): CKTOTAL, CKMB, CKMBINDEX, TROPONINI in the last 168 hours. ProBNP, BNP (last 5 results) Recent Labs    10/21/24 0428  PROBNP 5,751.0*   HbA1C: No results for input(s): HGBA1C in the last 72 hours. CBG: Recent Labs  Lab 10/22/24 1158 10/22/24 1658 10/22/24 1954 10/22/24 2323 10/23/24 0341  GLUCAP 137* 124* 114* 141* 133*   Lipid Profile: No results for input(s): CHOL, HDL, LDLCALC, TRIG, CHOLHDL, LDLDIRECT in the last 72 hours. Thyroid Function Tests: No results for input(s): TSH, T4TOTAL, FREET4,  T3FREE, THYROIDAB in the last 72 hours. Anemia Panel: No results for input(s): VITAMINB12, FOLATE, FERRITIN, TIBC, IRON, RETICCTPCT in the last 72 hours. Sepsis Labs: Recent Labs  Lab 10/20/24 1758 10/20/24 1928 10/21/24 0428  PROCALCITON  --   --  0.47  LATICACIDVEN 1.8 2.1* 2.1*    Recent Results (from the past 240 hours)  Resp panel by RT-PCR (RSV, Flu A&B, Covid) Urine, Catheterized     Status: None   Collection Time: 10/20/24  5:59 PM   Specimen: Urine, Catheterized; Nasal Swab  Result Value Ref Range Status   SARS Coronavirus 2 by RT PCR NEGATIVE NEGATIVE Final    Comment: (NOTE) SARS-CoV-2 target nucleic acids are NOT DETECTED.  The SARS-CoV-2 RNA is generally detectable in upper respiratory specimens during the acute phase of infection. The lowest concentration of SARS-CoV-2 viral copies this assay can detect is 138 copies/mL. A negative result does not preclude SARS-Cov-2 infection and should not be used as the sole basis for treatment or other patient management decisions. A negative result may occur with  improper specimen collection/handling, submission of specimen other than nasopharyngeal swab, presence of viral mutation(s) within the areas targeted by this assay, and inadequate number of viral copies(<138 copies/mL). A negative result must be combined with clinical observations, patient history, and epidemiological information. The expected result is Negative.  Fact Sheet for Patients:  bloggercourse.com  Fact Sheet for Healthcare Providers:  seriousbroker.it  This test is no t yet approved or cleared by the [place]  FDA and  has been authorized for detection and/or diagnosis of SARS-CoV-2 by FDA under an Emergency Use Authorization (EUA). This EUA will remain  in effect (meaning this test can be used) for the duration of the COVID-19 declaration under Section 564(b)(1) of the Act,  21 U.S.C.section 360bbb-3(b)(1), unless the authorization is terminated  or revoked sooner.       Influenza A by PCR NEGATIVE NEGATIVE Final   Influenza B by PCR NEGATIVE NEGATIVE Final    Comment: (NOTE) The Xpert Xpress SARS-CoV-2/FLU/RSV plus assay is intended as an aid in the diagnosis of influenza from Nasopharyngeal swab specimens and should not be used as a sole basis for treatment. Nasal washings and aspirates are unacceptable for Xpert Xpress SARS-CoV-2/FLU/RSV testing.  Fact Sheet for Patients: bloggercourse.com  Fact Sheet for Healthcare Providers: seriousbroker.it  This test is not  yet approved or cleared by the [place]  FDA and has been authorized for detection and/or diagnosis of SARS-CoV-2 by FDA under an Emergency Use Authorization (EUA). This EUA will remain in effect (meaning this test can be used) for the duration of the COVID-19 declaration under Section 564(b)(1) of the Act, 21 U.S.C. section 360bbb-3(b)(1), unless the authorization is terminated or revoked.     Resp Syncytial Virus by PCR NEGATIVE NEGATIVE Final    Comment: (NOTE) Fact Sheet for Patients: bloggercourse.com  Fact Sheet for Healthcare Providers: seriousbroker.it  This test is not yet approved or cleared by the [place]  FDA and has been authorized for detection and/or diagnosis of SARS-CoV-2 by FDA under an Emergency Use Authorization (EUA). This EUA will remain in effect (meaning this test can be used) for the duration of the COVID-19 declaration under Section 564(b)(1) of the Act, 21 U.S.C. section 360bbb-3(b)(1), unless the authorization is terminated or revoked.  Performed at [facility], [address] [place], [place] [address]   Urine Culture     Status: Abnormal   Collection Time: 10/20/24  5:59 PM   Specimen: Urine, Random  Result Value Ref  Range Status   Specimen Description   Final    URINE, RANDOM Performed at [facility], [address], [place], [place] [address]    Special Requests   Final    NONE Reflexed from [id number] Performed at [facility], [address], [place], [place] [address]    Culture (A)  Final    >=100,000 COLONIES/mL KLEBSIELLA PNEUMONIAE Confirmed Extended Spectrum Beta-Lactamase Producer (ESBL).  In bloodstream infections from ESBL organisms, carbapenems are preferred over piperacillin /tazobactam. They are shown to have a lower risk of mortality. 20,000 COLONIES/mL ESCHERICHIA COLI CESBL BOTH ORGANISMS Performed at [facility], [address] [place], [place] [address]    Report Status 10/23/2024 FINAL  Final   Organism ID, Bacteria KLEBSIELLA PNEUMONIAE (A)  Final   Organism ID, Bacteria ESCHERICHIA COLI (A)  Final      Susceptibility   Escherichia coli - MIC*    AMPICILLIN >=32 RESISTANT Resistant     CEFAZOLIN (URINE) Value in next row Resistant      >=32 RESISTANTThis is a modified FDA-approved test that has been validated and its performance characteristics determined by the reporting laboratory.  This laboratory is certified under the Clinical Laboratory Improvement Amendments CLIA as qualified to perform high complexity clinical laboratory testing.    CEFEPIME  Value in next row Resistant      >=32 RESISTANTThis is a modified FDA-approved test that has been validated and its performance characteristics determined by the reporting laboratory.  This laboratory is certified under the Clinical Laboratory Improvement Amendments CLIA as qualified to perform high complexity clinical laboratory testing.    ERTAPENEM Value in next row Sensitive      >=32 RESISTANTThis is a modified FDA-approved test that has been validated and its performance characteristics determined by the reporting laboratory.  This laboratory is certified under the Clinical Laboratory  Improvement Amendments CLIA as qualified to perform high complexity clinical laboratory testing.    CEFTRIAXONE  Value in next row Resistant      >=32 RESISTANTThis is a modified FDA-approved test that has been validated and its performance characteristics determined by the reporting laboratory.  This laboratory is certified under the Clinical Laboratory Improvement Amendments CLIA as qualified to perform high complexity clinical laboratory testing.    CIPROFLOXACIN Value in next row Intermediate      >=  32 RESISTANTThis is a modified FDA-approved test that has been validated and its performance characteristics determined by the reporting laboratory.  This laboratory is certified under the Clinical Laboratory Improvement Amendments CLIA as qualified to perform high complexity clinical laboratory testing.    GENTAMICIN Value in next row Sensitive      >=32 RESISTANTThis is a modified FDA-approved test that has been validated and its performance characteristics determined by the reporting laboratory.  This laboratory is certified under the Clinical Laboratory Improvement Amendments CLIA as qualified to perform high complexity clinical laboratory testing.    NITROFURANTOIN Value in next row Sensitive      >=32 RESISTANTThis is a modified FDA-approved test that has been validated and its performance characteristics determined by the reporting laboratory.  This laboratory is certified under the Clinical Laboratory Improvement Amendments CLIA as qualified to perform high complexity clinical laboratory testing.    TRIMETH/SULFA Value in next row Sensitive      >=32 RESISTANTThis is a modified FDA-approved test that has been validated and its performance characteristics determined by the reporting laboratory.  This laboratory is certified under the Clinical Laboratory Improvement Amendments CLIA as qualified to perform high complexity clinical laboratory testing.    AMPICILLIN/SULBACTAM Value in next row Resistant       >=32 RESISTANTThis is a modified FDA-approved test that has been validated and its performance characteristics determined by the reporting laboratory.  This laboratory is certified under the Clinical Laboratory Improvement Amendments CLIA as qualified to perform high complexity clinical laboratory testing.    PIP/TAZO Value in next row Intermediate      64 INTERMEDIATEThis is a modified FDA-approved test that has been validated and its performance characteristics determined by the reporting laboratory.  This laboratory is certified under the Clinical Laboratory Improvement Amendments CLIA as qualified to perform high complexity clinical laboratory testing.    MEROPENEM  Value in next row Sensitive      64 INTERMEDIATEThis is a modified FDA-approved test that has been validated and its performance characteristics determined by the reporting laboratory.  This laboratory is certified under the Clinical Laboratory Improvement Amendments CLIA as qualified to perform high complexity clinical laboratory testing.    * 20,000 COLONIES/mL ESCHERICHIA COLI   Klebsiella pneumoniae - MIC*    AMPICILLIN Value in next row Resistant      64 INTERMEDIATEThis is a modified FDA-approved test that has been validated and its performance characteristics determined by the reporting laboratory.  This laboratory is certified under the Clinical Laboratory Improvement Amendments CLIA as qualified to perform high complexity clinical laboratory testing.    CEFAZOLIN (URINE) Value in next row Resistant      >=32 RESISTANTThis is a modified FDA-approved test that has been validated and its performance characteristics determined by the reporting laboratory.  This laboratory is certified under the Clinical Laboratory Improvement Amendments CLIA as qualified to perform high complexity clinical laboratory testing.    CEFEPIME  Value in next row Resistant      >=32 RESISTANTThis is a modified FDA-approved test that has been validated  and its performance characteristics determined by the reporting laboratory.  This laboratory is certified under the Clinical Laboratory Improvement Amendments CLIA as qualified to perform high complexity clinical laboratory testing.    ERTAPENEM Value in next row Sensitive      >=32 RESISTANTThis is a modified FDA-approved test that has been validated and its performance characteristics determined by the reporting laboratory.  This laboratory is certified under the Clinical Laboratory Improvement Amendments CLIA as  qualified to perform high complexity clinical laboratory testing.    CEFTRIAXONE  Value in next row Resistant      >=32 RESISTANTThis is a modified FDA-approved test that has been validated and its performance characteristics determined by the reporting laboratory.  This laboratory is certified under the Clinical Laboratory Improvement Amendments CLIA as qualified to perform high complexity clinical laboratory testing.    CIPROFLOXACIN Value in next row Intermediate      >=32 RESISTANTThis is a modified FDA-approved test that has been validated and its performance characteristics determined by the reporting laboratory.  This laboratory is certified under the Clinical Laboratory Improvement Amendments CLIA as qualified to perform high complexity clinical laboratory testing.    GENTAMICIN Value in next row Sensitive      >=32 RESISTANTThis is a modified FDA-approved test that has been validated and its performance characteristics determined by the reporting laboratory.  This laboratory is certified under the Clinical Laboratory Improvement Amendments CLIA as qualified to perform high complexity clinical laboratory testing.    NITROFURANTOIN Value in next row Intermediate      >=32 RESISTANTThis is a modified FDA-approved test that has been validated and its performance characteristics determined by the reporting laboratory.  This laboratory is certified under the Clinical Laboratory Improvement  Amendments CLIA as qualified to perform high complexity clinical laboratory testing.    TRIMETH/SULFA Value in next row Sensitive      >=32 RESISTANTThis is a modified FDA-approved test that has been validated and its performance characteristics determined by the reporting laboratory.  This laboratory is certified under the Clinical Laboratory Improvement Amendments CLIA as qualified to perform high complexity clinical laboratory testing.    AMPICILLIN/SULBACTAM Value in next row Intermediate      >=32 RESISTANTThis is a modified FDA-approved test that has been validated and its performance characteristics determined by the reporting laboratory.  This laboratory is certified under the Clinical Laboratory Improvement Amendments CLIA as qualified to perform high complexity clinical laboratory testing.    PIP/TAZO Value in next row Sensitive      <=4 SENSITIVEThis is a modified FDA-approved test that has been validated and its performance characteristics determined by the reporting laboratory.  This laboratory is certified under the Clinical Laboratory Improvement Amendments CLIA as qualified to perform high complexity clinical laboratory testing.    MEROPENEM  Value in next row Sensitive      <=4 SENSITIVEThis is a modified FDA-approved test that has been validated and its performance characteristics determined by the reporting laboratory.  This laboratory is certified under the Clinical Laboratory Improvement Amendments CLIA as qualified to perform high complexity clinical laboratory testing.    * >=100,000 COLONIES/mL KLEBSIELLA PNEUMONIAE  Blood Culture (routine x 2)     Status: None (Preliminary result)   Collection Time: 10/20/24  6:19 PM   Specimen: BLOOD  Result Value Ref Range Status   Specimen Description   Final    BLOOD BLOOD LEFT WRIST Performed at [facility], [address], [place], [place] [address]    Special Requests   Final    BOTTLES DRAWN AEROBIC ONLY Blood  Culture results may not be optimal due to an inadequate volume of blood received in culture bottles Performed at [facility], [address], [place], [place] [address]    Culture   Final    NO GROWTH 3 DAYS Performed at [facility], [address] [place], [place] [address]    Report Status PENDING  Incomplete  MRSA  Next Gen by PCR, Nasal     Status: Abnormal   Collection Time: 10/20/24 11:52 PM   Specimen: Nasal Mucosa; Nasal Swab  Result Value Ref Range Status   MRSA by PCR Next Gen DETECTED (A) NOT DETECTED Final    Comment: (NOTE) The GeneXpert MRSA Assay (FDA approved for NASAL specimens only), is one component of a comprehensive MRSA colonization surveillance program. It is not intended to diagnose MRSA infection nor to guide or monitor treatment for MRSA infections. Test performance is not FDA approved in patients less than 48 years old. Performed at [facility], [address] [place], [place] [address]   Culture, Respiratory w Gram Stain     Status: None (Preliminary result)   Collection Time: 10/21/24 12:53 AM   Specimen: Tracheal Aspirate  Result Value Ref Range Status   Specimen Description   Final    TRACHEAL ASPIRATE Performed at [facility], [address] [place], [place] [address]    Special Requests   Final    NONE Performed at [facility], [address], [place], [place] [address]    Gram Stain   Final    FEW WBC PRESENT, PREDOMINANTLY PMN FEW GRAM POSITIVE COCCI FEW GRAM POSITIVE RODS RARE GRAM NEGATIVE RODS    Culture   Final    FEW PSEUDOMONAS AERUGINOSA CULTURE REINCUBATED FOR BETTER GROWTH Performed at [facility], [address], [place], [place] [address]    Report Status PENDING  Incomplete  Blood Culture (routine x 2)     Status: None (Preliminary result)   Collection Time: 10/21/24  9:35 AM   Specimen: BLOOD LEFT HAND  Result Value Ref Range  Status   Specimen Description   Final    BLOOD LEFT HAND Performed at [facility], [address], [place], [place] [address]    Special Requests   Final    BOTTLES DRAWN AEROBIC ONLY Blood Culture results may not be optimal due to an inadequate volume of blood received in culture bottles Performed at [facility], [address], [place], [place] [address]    Culture   Final    NO GROWTH 2 DAYS Performed at [facility], [address], [place], [place] [address]    Report Status PENDING  Incomplete     Radiology Studies: DG Abd 1 View Result Date: 10/22/2024 CLINICAL DATA:  Nasogastric tube placement. EXAM: ABDOMEN - 1 VIEW COMPARISON:  Same day FINDINGS: Feeding tube tip is seen in expected position of distal stomach. Coiling of tube within the stomach is no longer present. IMPRESSION: Feeding tube tip is seen in expected position of distal stomach. Electronically Signed   By: Lynwood Landy Raddle M.D.   On: 10/22/2024 13:22   DG Abd 1 View Result Date: 10/22/2024 CLINICAL DATA:  Feeding tube placement. EXAM: ABDOMEN - 1 VIEW COMPARISON:  02/26/2021 FINDINGS: Feeding tube tip is coiled in the stomach. Distal tip is in the antral region directed distally towards the pylorus. Visualized abdomen demonstrates nonspecific bowel gas pattern. There is probably atelectasis or infiltrate in the right lung base. IMPRESSION: Feeding tube tip is coiled in the stomach with distal tip in the antral region directed distally towards the pylorus. Electronically Signed   By: Camellia Candle M.D.   On: 10/22/2024 08:19   ECHOCARDIOGRAM COMPLETE Result Date: 10/21/2024    ECHOCARDIOGRAM REPORT   Patient Name:   Kim Jenkins  Date of  Exam: 10/21/2024 Medical Rec #:  [id number]  Height:       66.0 in Accession #:    [id number] Weight:       381.8 lb Date of Birth:  04-Dec-1965 BSA:          2.632 m Patient Age:    58 years   BP:           104/62 mmHg Patient Gender: F          HR:           108  bpm. Exam Location:  Inpatient Procedure: 2D Echo, Cardiac Doppler, Color Doppler and Intracardiac            Opacification Agent (Both Spectral and Color Flow Doppler were            utilized during procedure). Indications:    Dyspnea  History:        Patient has prior history of Echocardiogram examinations, most                 recent 12/05/2019. Risk Factors:Diabetes.  Sonographer:    Odella Brewster Referring Phys: [id number] [facility] FERNANDEZ  Sonographer Comments: Technically difficult study due to poor echo windows, patient is obese and no apical window. Image acquisition challenging due to patient body habitus. IMPRESSIONS  1. Left ventricular ejection fraction, by estimation, is 55 to 60%. The left ventricle has normal function. Left ventricular endocardial border not optimally defined to evaluate regional wall motion. Left ventricular diastolic parameters are consistent with Grade I diastolic dysfunction (impaired relaxation).  2. Right ventricular systolic function is mildly reduced. The right ventricular size is moderately enlarged.  3. Right atrial size was mildly dilated.  4. The mitral valve was not well visualized. No evidence of mitral valve regurgitation.  5. The aortic valve was not well visualized. Aortic valve regurgitation is not visualized. No aortic stenosis is present. Conclusion(s)/Recommendation(s): Extremely challenging echo with poor images. LV function appears roughly normal, RV is enlarged and appears at least mildly reduced. No significant valvular abnormalities from provided images. FINDINGS  Left Ventricle: Left ventricular ejection fraction, by estimation, is 55 to 60%. The left ventricle has normal function. Left ventricular endocardial border not optimally defined to evaluate regional wall motion. Definity  contrast agent was given IV to delineate the left ventricular endocardial borders. The left ventricular internal cavity size was normal in size. There is no left ventricular  hypertrophy. Left ventricular diastolic parameters are consistent with Grade I diastolic dysfunction (impaired relaxation). Right Ventricle: The right ventricular size is moderately enlarged. No increase in right ventricular wall thickness. Right ventricular systolic function is mildly reduced. Left Atrium: Left atrial size was normal in size. Right Atrium: Right atrial size was mildly dilated. Pericardium: There is no evidence of pericardial effusion. Mitral Valve: The mitral valve was not well visualized. No evidence of mitral valve regurgitation. MV peak gradient, 4.6 mmHg. The mean mitral valve gradient is 3.0 mmHg. Tricuspid Valve: The tricuspid valve is grossly normal. Tricuspid valve regurgitation is mild. Aortic Valve: The aortic valve was not well visualized. Aortic valve regurgitation is not visualized. No aortic stenosis is present. Aortic valve mean gradient measures 6.7 mmHg. Aortic valve peak gradient measures 11.0 mmHg. Aortic valve area, by VTI measures 1.63 cm. Pulmonic Valve: The pulmonic valve was not well visualized. Aorta: The ascending aorta was not well visualized. IAS/Shunts: The atrial septum is grossly normal.  LEFT VENTRICLE PLAX 2D LVIDd:  4.10 cm     Diastology LVIDs:         3.50 cm     LV e' medial:    4.03 cm/s LV PW:         0.70 cm     LV E/e' medial:  14.6 LV IVS:        0.70 cm     LV e' lateral:   5.98 cm/s LVOT diam:     2.00 cm     LV E/e' lateral: 9.8 LV SV:         32 LV SV Index:   12 LVOT Area:     3.14 cm LV IVRT:       88 msec  LV Volumes (MOD) LV vol d, MOD A2C: 36.1 ml LV vol d, MOD A4C: 39.1 ml LV vol s, MOD A2C: 12.0 ml LV vol s, MOD A4C: 11.6 ml LV SV MOD A2C:     24.1 ml LV SV MOD A4C:     39.1 ml LV SV MOD BP:      24.8 ml RIGHT VENTRICLE            IVC RV Basal diam:  4.80 cm    IVC diam: 1.70 cm RV Mid diam:    3.30 cm RV S prime:     8.59 cm/s TAPSE (M-mode): 1.7 cm LEFT ATRIUM           Index        RIGHT ATRIUM           Index LA diam:      3.00 cm  1.14 cm/m   RA Area:     16.70 cm LA Vol (A2C): 47.5 ml 18.04 ml/m  RA Volume:   48.00 ml  18.23 ml/m LA Vol (A4C): 63.4 ml 24.08 ml/m  AORTIC VALVE                     PULMONIC VALVE AV Area (Vmax):    1.47 cm      PV Vmax:       0.86 m/s AV Area (Vmean):   1.46 cm      PV Peak grad:  2.9 mmHg AV Area (VTI):     1.63 cm AV Vmax:           166.00 cm/s AV Vmean:          124.333 cm/s AV VTI:            0.194 m AV Peak Grad:      11.0 mmHg AV Mean Grad:      6.7 mmHg LVOT Vmax:         77.85 cm/s LVOT Vmean:        57.700 cm/s LVOT VTI:          0.101 m LVOT/AV VTI ratio: 0.52  AORTA Ao Root diam: 3.10 cm MITRAL VALVE MV Area (PHT): 3.79 cm     SHUNTS MV Area VTI:   2.02 cm     Systemic VTI:  0.10 m MV Peak grad:  4.6 mmHg     Systemic Diam: 2.00 cm MV Mean grad:  3.0 mmHg MV Vmax:       1.07 m/s MV Vmean:      78.5 cm/s MV E velocity: 58.70 cm/s MV A velocity: 111.00 cm/s MV E/A ratio:  0.53 Morene Brownie Electronically signed by Morene Brownie Signature Date/Time: 10/21/2024/2:06:31 PM    Final     Scheduled  Meds:  Chlorhexidine  Gluconate Cloth  6 each Topical Daily   enoxaparin  (LOVENOX ) injection  80 mg Subcutaneous Q24H   furosemide   40 mg Intravenous Q12H   midodrine   10 mg Per Tube Q8H   multivitamin with minerals  1 tablet Per Tube Daily   mupirocin  ointment  1 Application Nasal BID   nutrition supplement (JUVEN)  1 packet Per Tube BID   mouth rinse  15 mL Mouth Rinse Q2H   valproic  acid  500 mg Per Tube BID   Continuous Infusions:  sodium chloride  Stopped (10/23/24 0114)   feeding supplement (VITAL AF 1.2 CAL) 40 mL/hr at 10/23/24 0500   phenylephrine  (NEO-SYNEPHRINE) Adult infusion Stopped (10/23/24 0200)   piperacillin -tazobactam (ZOSYN )  IV Stopped (10/23/24 0513)     LOS: 3 days   Time spent: 35 minutes  Lonni KANDICE Moose, MD  [facility]  10/23/2024, 7:19 AM   "

## 2024-10-23 NOTE — Plan of Care (Signed)
   Problem: Nutrition: Goal: Adequate nutrition will be maintained Outcome: Progressing   Problem: Pain Managment: Goal: General experience of comfort will improve and/or be controlled Outcome: Progressing   Problem: Safety: Goal: Ability to remain free from injury will improve Outcome: Progressing

## 2024-10-23 NOTE — TOC Initial Note (Signed)
 Transition of Care [facility]) - Initial/Assessment Note    Patient Details  Name: Kim Jenkins MRN: [id number] Date of Birth: 1966/04/12  [facility]) CM/SW Contact:    Jon ONEIDA Anon, RN Phone Number: 10/23/2024, 11:32 AM  Clinical Narrative:                  Pt is from [facility] for LTC. Pt is trach and on the ventilator mistly at night at baseline. Per Angie, RN Care Coordinator at [facility], states pt is off the ventilator to eat, but has been requiring mechanical ventilation more often. She states that once pt is medically ready, she can return. Pt will likely need [facility] transportation due to being on the ventilator. ICM will continue to follow for DC planning needs.     Expected Discharge Plan: Skilled Nursing Facility ([facility] for LTC) Barriers to Discharge: Continued Medical Work up   Patient Goals and CMS Choice Patient states their goals for this hospitalization and ongoing recovery are:: Return to [facility] CMS Medicare.gov Compare Post Acute Care list provided to:: Patient Choice offered to / list presented to : Patient [place] ownership interest in [facility].provided to:: Patient    Expected Discharge Plan and Services In-house Referral: NA Discharge Planning Services: CM Consult Post Acute Care Choice: Skilled Nursing Facility Living arrangements for the past 2 months: Skilled Nursing Facility                 DME Arranged: N/A DME Agency: NA       HH Arranged: NA HH Agency: NA        Prior Living Arrangements/Services Living arrangements for the past 2 months: Skilled Nursing Facility Lives with:: Facility Resident Patient language and need for interpreter reviewed:: Yes Do you feel safe going back to the place where you live?: Yes      Need for Family Participation in Patient Care: Yes (Comment) Care giver support system in place?: Yes (comment) Current home services: DME Criminal Activity/Legal Involvement Pertinent  to Current Situation/Hospitalization: No - Comment as needed  Activities of Daily Living   ADL Screening (condition at time of admission) Independently performs ADLs?: No Does the patient have a NEW difficulty with bathing/dressing/toileting/self-feeding that is expected to last >3 days?: No Does the patient have a NEW difficulty with getting in/out of bed, walking, or climbing stairs that is expected to last >3 days?: No Does the patient have a NEW difficulty with communication that is expected to last >3 days?: No Is the patient deaf or have difficulty hearing?: No Does the patient have difficulty seeing, even when wearing glasses/contacts?: No Does the patient have difficulty concentrating, remembering, or making decisions?: Yes  Permission Sought/Granted Permission sought to share information with : Facility Medical Sales Representative, Family Supports    Share Information with NAME: Johnson, Jodie  Other, Emergency Contact  [phone]  Permission granted to share info w AGENCY: [facility]        Emotional Assessment Appearance:: Other (Comment Required Attitude/Demeanor/Rapport: Unable to Assess Affect (typically observed): Unable to Assess Orientation: :  (Pt currently on the ventilator) Alcohol  / Substance Use: Not Applicable Psych Involvement: No (comment)  Admission diagnosis:  Sepsis (HCC) [A41.9] Urinary tract infection without hematuria, site unspecified [N39.0] Altered mental status, unspecified altered mental status type [R41.82] Sepsis, due to unspecified organism, unspecified whether acute organ dysfunction present [facility]) [A41.9] Patient Active Problem List   Diagnosis Date Noted   Sepsis (HCC) 10/20/2024  Acute on chronic respiratory failure (HCC) 10/26/2022   AMS (altered mental status) 09/09/2022   HAP (hospital-acquired pneumonia)    Respiratory failure (HCC) 12/28/2019   Other emphysema (HCC)    Acute pulmonary embolism (HCC) 12/04/2019   Hypoxemia     Pressure injury of skin 05/06/2018   Acute on chronic respiratory failure with hypoxemia (HCC)    Hypercarbia    GI bleed 05/05/2018   Hemorrhagic shock (HCC) 05/05/2018   Acute blood loss anemia 05/05/2018   Tracheostomy care (HCC) 05/05/2018   OSA (obstructive sleep apnea) 05/05/2018   DM2 (diabetes mellitus, type 2) (HCC) 05/05/2018   Bipolar 1 disorder (HCC) 05/05/2018   Chronic pain 05/05/2018   Depression 05/05/2018   Acute kidney injury 05/05/2018   Acute upper GI bleed    Tracheostomy status (HCC) 02/17/2014   Morbid obesity (HCC) 02/17/2014   Chronic respiratory failure with hypoxia and hypercapnia (HCC) 01/23/2014   Acute encephalopathy 01/23/2014   PCP:  Fleeta Valeria Mayo, MD Pharmacy:   [facility] [place], [place] - [address] [place] [address] [place] [address] Phone: [phone] Fax: [phone]     Social Drivers of Health (SDOH) Social History: SDOH Screenings   Tobacco Use: Low Risk (05/26/2024)   SDOH Interventions:     Readmission Risk Interventions    10/23/2024   10:33 AM  Readmission Risk Prevention Plan  Transportation Screening Complete  PCP or Specialist Appt within 3-5 Days Complete  HRI or Home Care Consult Complete  Social Work Consult for Recovery Care Planning/Counseling Complete  Palliative Care Screening Not Applicable  Medication Review Oceanographer) Complete

## 2024-10-24 ENCOUNTER — Inpatient Hospital Stay (HOSPITAL_COMMUNITY)

## 2024-10-24 LAB — CBC WITH DIFFERENTIAL/PLATELET
Abs Immature Granulocytes: 0.16 10*3/uL — ABNORMAL HIGH (ref 0.00–0.07)
Basophils Absolute: 0 10*3/uL (ref 0.0–0.1)
Basophils Relative: 0 %
Eosinophils Absolute: 0 10*3/uL (ref 0.0–0.5)
Eosinophils Relative: 0 %
HCT: 29.6 % — ABNORMAL LOW (ref 36.0–46.0)
Hemoglobin: 8.9 g/dL — ABNORMAL LOW (ref 12.0–15.0)
Immature Granulocytes: 2 %
Lymphocytes Relative: 20 %
Lymphs Abs: 1.9 10*3/uL (ref 0.7–4.0)
MCH: 30.7 pg (ref 26.0–34.0)
MCHC: 30.1 g/dL (ref 30.0–36.0)
MCV: 102.1 fL — ABNORMAL HIGH (ref 80.0–100.0)
Monocytes Absolute: 1.1 10*3/uL — ABNORMAL HIGH (ref 0.1–1.0)
Monocytes Relative: 12 %
Neutro Abs: 6 10*3/uL (ref 1.7–7.7)
Neutrophils Relative %: 66 %
Platelets: 113 10*3/uL — ABNORMAL LOW (ref 150–400)
RBC: 2.9 MIL/uL — ABNORMAL LOW (ref 3.87–5.11)
RDW: 16.7 % — ABNORMAL HIGH (ref 11.5–15.5)
WBC: 9.2 10*3/uL (ref 4.0–10.5)
nRBC: 0 % (ref 0.0–0.2)

## 2024-10-24 LAB — GLUCOSE, CAPILLARY
Glucose-Capillary: 145 mg/dL — ABNORMAL HIGH (ref 70–99)
Glucose-Capillary: 146 mg/dL — ABNORMAL HIGH (ref 70–99)
Glucose-Capillary: 153 mg/dL — ABNORMAL HIGH (ref 70–99)
Glucose-Capillary: 153 mg/dL — ABNORMAL HIGH (ref 70–99)
Glucose-Capillary: 149 mg/dL — ABNORMAL HIGH (ref 70–99)

## 2024-10-24 LAB — COMPREHENSIVE METABOLIC PANEL WITH GFR
ALT: 10 U/L (ref 0–44)
AST: 24 U/L (ref 15–41)
Albumin: 1.9 g/dL — ABNORMAL LOW (ref 3.5–5.0)
Alkaline Phosphatase: 180 U/L — ABNORMAL HIGH (ref 38–126)
Anion gap: 7 (ref 5–15)
BUN: 29 mg/dL — ABNORMAL HIGH (ref 6–20)
CO2: 33 mmol/L — ABNORMAL HIGH (ref 22–32)
Calcium: 7.8 mg/dL — ABNORMAL LOW (ref 8.9–10.3)
Chloride: 98 mmol/L (ref 98–111)
Creatinine, Ser: 0.64 mg/dL (ref 0.44–1.00)
GFR, Estimated: >60 mL/min
Glucose, Bld: 155 mg/dL — ABNORMAL HIGH (ref 70–99)
Potassium: 2.7 mmol/L — CL (ref 3.5–5.1)
Sodium: 138 mmol/L (ref 135–145)
Total Bilirubin: 0.3 mg/dL (ref 0.0–1.2)
Total Protein: 5.4 g/dL — ABNORMAL LOW (ref 6.5–8.1)

## 2024-10-24 LAB — CULTURE, BLOOD (ROUTINE X 2)
Culture: NO GROWTH
Culture: NO GROWTH

## 2024-10-24 LAB — BASIC METABOLIC PANEL WITH GFR
Anion gap: 6 (ref 5–15)
BUN: 30 mg/dL — ABNORMAL HIGH (ref 6–20)
CO2: 36 mmol/L — ABNORMAL HIGH (ref 22–32)
Calcium: 7.9 mg/dL — ABNORMAL LOW (ref 8.9–10.3)
Chloride: 98 mmol/L (ref 98–111)
Creatinine, Ser: 0.56 mg/dL (ref 0.44–1.00)
GFR, Estimated: >60 mL/min
Glucose, Bld: 161 mg/dL — ABNORMAL HIGH (ref 70–99)
Potassium: 2.9 mmol/L — ABNORMAL LOW (ref 3.5–5.1)
Sodium: 139 mmol/L (ref 135–145)

## 2024-10-24 LAB — CULTURE, RESPIRATORY W GRAM STAIN

## 2024-10-24 LAB — LACTIC ACID, PLASMA: Lactic Acid, Venous: 1.3 mmol/L (ref 0.5–1.9)

## 2024-10-24 LAB — HEMOGLOBIN A1C
Hgb A1c MFr Bld: 4.9 % (ref 4.8–5.6)
Mean Plasma Glucose: 93.93 mg/dL

## 2024-10-24 LAB — MAGNESIUM: Magnesium: 2.1 mg/dL (ref 1.7–2.4)

## 2024-10-24 LAB — PHOSPHORUS: Phosphorus: 2.2 mg/dL — ABNORMAL LOW (ref 2.5–4.6)

## 2024-10-24 MED ORDER — ORAL CARE MOUTH RINSE: 15.0000 mL | OROMUCOSAL | Status: AC | PRN

## 2024-10-24 MED ORDER — POTASSIUM CHLORIDE 20 MEQ PO PACK
40.0000 meq | PACK | ORAL | Status: AC
  Administered 2024-10-24: 40 meq
  Filled 2024-10-24: qty 2

## 2024-10-24 MED ORDER — POTASSIUM CHLORIDE 20 MEQ PO PACK
40.0000 meq | PACK | Freq: Four times a day (QID) | ORAL | Status: AC
  Administered 2024-10-24 (×2): 40 meq
  Filled 2024-10-24 (×2): qty 2

## 2024-10-24 MED ORDER — GERHARDT'S BUTT CREAM
TOPICAL_CREAM | Freq: Three times a day (TID) | CUTANEOUS | Status: AC
  Filled 2024-10-24: qty 60

## 2024-10-24 MED ORDER — SODIUM CHLORIDE 0.9 % IV SOLN
1.0000 g | Freq: Three times a day (TID) | INTRAVENOUS | Status: AC
  Administered 2024-10-24: 1 g via INTRAVENOUS
  Filled 2024-10-24 (×2): qty 20

## 2024-10-24 MED ORDER — ORAL CARE MOUTH RINSE: 15.0000 mL | OROMUCOSAL | Status: AC

## 2024-10-24 MED ORDER — LACTATED RINGERS IV BOLUS
250.0000 mL | Freq: Once | INTRAVENOUS | Status: AC
  Administered 2024-10-24: 250 mL via INTRAVENOUS

## 2024-10-24 MED ORDER — FUROSEMIDE 10 MG/ML IJ SOLN
80.0000 mg | Freq: Four times a day (QID) | INTRAMUSCULAR | Status: AC
  Administered 2024-10-24 (×2): 80 mg via INTRAVENOUS
  Filled 2024-10-24 (×2): qty 8

## 2024-10-24 MED ORDER — POTASSIUM CHLORIDE 10 MEQ/100ML IV SOLN
10.0000 meq | INTRAVENOUS | Status: AC
  Administered 2024-10-24: 10 meq via INTRAVENOUS
  Filled 2024-10-24 (×2): qty 100

## 2024-10-24 MED ORDER — NOREPINEPHRINE 4 MG/250ML-% IV SOLN
0.0000 ug/min | INTRAVENOUS | Status: AC
  Administered 2024-10-24: 2 ug/min via INTRAVENOUS
  Filled 2024-10-24: qty 250

## 2024-10-24 NOTE — Progress Notes (Signed)
 "  NAME:  Kim Jenkins, MRN:  [id number], DOB:  09-26-1965, LOS: 4 ADMISSION DATE:  10/20/2024,   History of Present Illness:  59 year old F, [facility], with history of obesity, chronic trach with nocturnal ventilator use, bipolar disorder, DM II, and OSA/OHS who was brought by EMS due to hypotension 70/40 at the facility and more lethargy and confusion. Patient was admitted on 05/30/2024 - purpura, aspiration pneumonia RLL.  On the ER patient was found soft blood pressure after receiving 2L of LR. Her labs were, VBG pH 7.44, co2 51, CMP unremarkable, CBC with WBC 15, normal glucose 125, neg RVP, UA was moderate leukocytes +nitrates, >50 WBC, blood cx and urine cx pending. CXR showed mild bibasilar atelectasis, R>L.  PCCM was consulted for hypotension. Pt got 2L and vanc+zosyn . During my encounter, patient is lethargic, no able to respond to questions, just to painful stimulation. On PRVC. She has MAP 55-60, after 2 L. She has anasarca with R and L upper extremity erythema.    Pertinent  Medical History   Past Medical History:  Diagnosis Date   Bipolar 1 disorder (HCC)    Chronic pain    Depression    Diabetes mellitus (HCC)    Hypertension    Kidney stones    Morbid obesity (HCC)    Obesity hypoventilation syndrome (HCC)    Obstructive sleep apnea    Panniculitis      Significant Hospital Events: Including procedures, antibiotic start and stop dates in addition to other pertinent events   2/1: Admitted to ICU with encephalopathy and hypotension c/f septic shock. 2/2: Ucx w/ GNRs. 2/4: Ucx growing ESBL K.pneumoniae, though S to Zosyn . Patient has clinically improved on Zosyn . 2/6 FiO2 100%, does not appear in distress, hypoxemia prompted reconsult  Interim History / Subjective:  More hypoxemic.  No repeat imaging despite increasing hypoxemia over the last several hours.  100% FiO2 PEEP of 8.  She satting 100%.  Increased PEEP.  Suspect she needs higher PEEP.  Notably  her chest 2/2 x-ray showed right-sided pleural effusion.  She is reportedly net +6 L.  Her BNP on admission was markedly elevated.  Objective    Blood pressure (!) 89/49, pulse 84, temperature (!) 97.5 F (36.4 C), resp. rate (!) 22, height 5' 6 (1.676 m), weight (!) 172 kg, SpO2 100%.    Vent Mode: PRVC FiO2 (%):  [50 %-100 %] 100 % Set Rate:  [16 bmp] 16 bmp Vt Set:  [500 mL] 500 mL PEEP:  [8 cmH20-10 cmH20] 8 cmH20 Plateau Pressure:  [0.9 cmH20-16 cmH20] 0.9 cmH20   Intake/Output Summary (Last 24 hours) at 10/24/2024 0851 Last data filed at 10/24/2024 9364 Gross per 24 hour  Intake 1551.08 ml  Output 1775 ml  Net -223.92 ml   Filed Weights   10/21/24 0436 10/22/24 0449 10/23/24 0500  Weight: (!) 173.2 kg (!) 173.2 kg (!) 172 kg    Examination: General: Chronically ill-appearing lying in bed, morbidly obese Neck: Trach in place Cardiovascular: Regular rhythm Abdomen: Nondistended Pulmonary: Coarse ventilated sounds Neuro: Arouses   Resolved problem list  Septic shock due to urinary tract infection Assessment and Plan   ESBL K. pneumoniae UTI Continue zosyn  for 7 day course  Acute on chronic hypoxemic/hypercarbic respiratory failure: Chronic trach  OSA/OHS - morbid obese: Chronic trach with nocturnal ventilator Start IV diuresis given markedly elevated BNP and subtle pleural effusions on initial chest x-ray Repeat chest x-ray 2/6 with decompensation worsening hypoxemia  Hypoalbuminemia Anasarca Lasix  with albumin   Endo Continue glucose checks with sliding scale insulin   Severe protein calorie malnutrition: Continue tube feeds  Hypotension: Slowly resolving sepsis versus concern for cardiogenic source of hypotension. --Continue current midodrine , resume pressors if needed with diuresis.     Labs   CBC: Recent Labs  Lab 10/20/24 1722 10/21/24 0428 10/22/24 0444 10/23/24 0830 10/24/24 0146  WBC 15.4* 20.7* 12.0* 11.5* 9.2  NEUTROABS 10.9* 18.6*  9.3* 8.6* 6.0  HGB 11.6* 12.4 11.4* 10.8* 8.9*  HCT 38.2 41.2 36.6 35.8* 29.6*  MCV 99.2 100.5* 99.2 100.3* 102.1*  PLT 252 353 238 180 113*    Basic Metabolic Panel: Recent Labs  Lab 10/21/24 0428 10/21/24 1838 10/22/24 0444 10/22/24 2248 10/23/24 0830 10/24/24 0146 10/24/24 0450  NA 136  --  135 136 137 138  --   K 3.2*  --  3.7 3.1* 3.6 2.7*  --   CL 98  --  98 97* 98 98  --   CO2 28  --  31 31 32 33*  --   GLUCOSE 183*  --  148* 144* 169* 155*  --   BUN 19  --  23* 29* 29* 29*  --   CREATININE 0.57  --  0.74 0.77 0.76 0.64  --   CALCIUM  7.6*  --  7.9* 7.7* 7.9* 7.8*  --   MG  --  1.7 1.8 1.7 2.0  --  2.1  PHOS  --  4.4 4.2  --  3.3  --  2.2*   GFR: Estimated Creatinine Clearance: 123.3 mL/min (by C-G formula based on SCr of 0.64 mg/dL). Recent Labs  Lab 10/20/24 1758 10/20/24 1928 10/21/24 0428 10/22/24 0444 10/23/24 0830 10/24/24 0146  PROCALCITON  --   --  0.47  --   --   --   WBC  --   --  20.7* 12.0* 11.5* 9.2  LATICACIDVEN 1.8 2.1* 2.1*  --   --  1.3    Liver Function Tests: Recent Labs  Lab 10/20/24 1722 10/21/24 0428 10/22/24 0444 10/23/24 0830 10/24/24 0146  AST 33 31 27 32 24  ALT 10 11 8 13 10   ALKPHOS 231* 238* 226* 252* 180*  BILITOT 0.5 0.5 0.4 0.4 0.3  PROT 5.8* 6.3* 6.1* 6.0* 5.4*  ALBUMIN  <1.5* 1.5* <1.5* 1.6* 1.9*   No results for input(s): LIPASE, AMYLASE in the last 168 hours. No results for input(s): AMMONIA in the last 168 hours.  ABG    Component Value Date/Time   PHART 7.345 (L) 10/25/2022 2355   PCO2ART 69.4 (HH) 10/25/2022 2355   PO2ART 60 (L) 10/25/2022 2355   HCO3 26.6 10/21/2024 0605   TCO2 40 (H) 10/25/2022 2355   O2SAT 92.6 10/21/2024 0605     Coagulation Profile: Recent Labs  Lab 10/20/24 1759  INR 1.4*    Cardiac Enzymes: No results for input(s): CKTOTAL, CKMB, CKMBINDEX, TROPONINI in the last 168 hours.  HbA1C: Hgb A1c MFr Bld  Date/Time Value Ref Range Status  10/23/2024 10:47 PM  4.9 4.8 - 5.6 % Final    Comment:    (NOTE) Diagnosis of Diabetes The following HbA1c ranges recommended by the American Diabetes Association (ADA) may be used as an aid in the diagnosis of diabetes mellitus.  Hemoglobin             Suggested A1C NGSP%              Diagnosis  <5.7  Non Diabetic  5.7-6.4                Pre-Diabetic  >6.4                   Diabetic  <7.0                   Glycemic control for                       adults with diabetes.    10/26/2022 01:57 AM 5.4 4.8 - 5.6 % Final    Comment:    (NOTE) Pre diabetes:          5.7%-6.4%  Diabetes:              >6.4%  Glycemic control for   <7.0% adults with diabetes     CBG: Recent Labs  Lab 10/23/24 1753 10/23/24 2008 10/23/24 2330 10/24/24 0414 10/24/24 0811  GLUCAP 172* 144* 139* 145* 146*    Review of Systems:   As above  Past Medical History:  She,  has a past medical history of Bipolar 1 disorder (HCC), Chronic pain, Depression, Diabetes mellitus (HCC), Hypertension, Kidney stones, Morbid obesity (HCC), Obesity hypoventilation syndrome (HCC), Obstructive sleep apnea, and Panniculitis.   Surgical History:   Past Surgical History:  Procedure Laterality Date   ESOPHAGOGASTRODUODENOSCOPY N/A 05/06/2018   Procedure: ESOPHAGOGASTRODUODENOSCOPY (EGD);  Surgeon: Wilhelmenia Aloha Raddle., MD;  [facility] ENDOSCOPY;  Service: Gastroenterology;  Laterality: N/A;   RIGHT OOPHORECTOMY     TRACHEOSTOMY     VENTRAL HERNIA REPAIR       Social History:   reports that she has never smoked. She has never used smokeless tobacco. She reports that she does not drink alcohol  and does not use drugs.   Family History:  Her family history is not on file.   Allergies Allergies[1]   Home Medications  Prior to Admission medications  Medication Sig Start Date End Date Taking? Authorizing Provider  acetaminophen  (TYLENOL ) 325 MG tablet Take 650 mg by mouth every 6 (six) hours as needed for  mild pain (pain score 1-3).   Yes [provider]  aspirin  81 MG chewable tablet Chew 81 mg by mouth daily.   Yes [provider]  famotidine  (PEPCID ) 40 MG tablet Take 40 mg by mouth every evening.   Yes [provider]  gabapentin  (NEURONTIN ) 600 MG tablet Take 600 mg by mouth every 12 (twelve) hours.   Yes [provider]  hydrALAZINE  (APRESOLINE ) 50 MG tablet Take 50 mg by mouth every 6 (six) hours as needed (SBP > 160 and DPB > 100).   Yes [provider]  ipratropium-albuterol  (DUONEB) 0.5-2.5 (3) MG/3ML SOLN Take 3 mLs by nebulization every 6 (six) hours as needed (for shortness of breath).   Yes [provider]  lidocaine  (LIDODERM ) 5 % Place 1 patch onto the skin daily. 09/23/24  Yes [provider]  loperamide (IMODIUM A-D) 2 MG tablet Take 2 mg by mouth every 6 (six) hours as needed for diarrhea or loose stools.    Yes [provider]  metformin (FORTAMET) 500 MG (OSM) 24 hr tablet Take 500 mg by mouth in the morning.   Yes [provider]  metFORMIN (GLUCOPHAGE-XR) 500 MG 24 hr tablet Take 500 mg by mouth daily. 10/01/24  Yes [provider]  Multiple Vitamin (MULTIVITAMIN WITH MINERALS) TABS tablet Take 1 tablet by mouth daily.   Yes  [provider]  nitroGLYCERIN (NITROSTAT) 0.4 MG SL tablet Place 0.4 mg under the tongue every 5 (five) minutes x 3 doses as needed for chest pain (AND CALL PROVIDER).    Yes [provider]  omeprazole (PRILOSEC) 40 MG capsule Take 40 mg by mouth every morning. 08/12/22  Yes [provider]  ondansetron  (ZOFRAN -ODT) 4 MG disintegrating tablet Take 4 mg by mouth every 6 (six) hours as needed for nausea or vomiting (DISSOLVE ORALLY).   Yes [provider]  oxyCODONE  (OXY IR/ROXICODONE ) 5 MG immediate release tablet Take 2.5 mg by mouth every 8 (eight) hours as needed for moderate pain (pain score 4-6). Per MAR it is 2.5 MG as needed every  8 hours. 10/01/24  Yes [provider]  Polyethyl Glyc-Propyl Glyc PF (SYSTANE HYDRATION PF) 0.4-0.3 % SOLN Place 1 drop into both eyes every 6 (six) hours.   Yes [provider]  polyethylene glycol (MIRALAX  / GLYCOLAX ) 17 g packet Take 17 g by mouth every 12 (twelve) hours as needed for mild constipation.   Yes [provider]  pravastatin (PRAVACHOL) 20 MG tablet Take 20 mg by mouth daily.   Yes [provider]  Valproate Sodium  (VALPROIC  ACID) 500 MG/10ML SOLN Take 10 mLs by mouth 2 (two) times daily.   Yes [provider]  venlafaxine  XR (EFFEXOR -XR) 75 MG 24 hr capsule Take 75 mg by mouth every morning.   Yes [provider]  VRAYLAR  4.5 MG CAPS Take 4.5 mg by mouth every morning. 08/16/22  Yes [provider]  amLODipine (NORVASC) 5 MG tablet Take 5 mg by mouth daily.    [provider]  divalproex  (DEPAKOTE  ER) 500 MG 24 hr tablet Take 2 tablets (1,000 mg total) by mouth every 12 (twelve) hours. 09/13/18   Ward, Ami Copes, PA-C  fentaNYL  (DURAGESIC ) 25 MCG/HR Place 1 patch onto the skin every 3 (three) days. 08/26/24   Fleeta Valeria Mayo, MD  FLUoxetine  (PROZAC ) 20 MG capsule Take 20 mg by mouth every morning. 08/25/22   [provider]  silver  sulfADIAZINE  (SILVADENE ) 1 % cream Apply 1 Application topically daily as needed (pain, itching). 05/26/24   Kammerer, Megan L, DO  sodium chloride  (OCEAN) 0.65 % SOLN nasal spray Place 2 sprays into both nostrils 2 (two) times daily as needed for congestion.    [provider]  triamcinolone  cream (KENALOG ) 0.1 % Apply 1 Application topically 2 (two) times daily as needed (pain, itching). Apply everywhere but face 05/26/24   Gennaro Duwaine CROME, DO      Total critical care time spent by me: 36 minutes   Critical care time was exclusive of separately billable procedures and the treatment of any other patient.   Critical care was necessary to treat or prevent imminent  or life-threatening deterioration.  Critical care was time spent personally by me on the following activities: development of treatment plan with patient and/or surrogate as well as nursing, discussions with consultants, re-evaluation of the patient's condition and their response to treatment, examination of patient, obtaining history from patient or surrogate, ordering and performing treatments and interventions, ordering and review of laboratory studies, ordering and review of radiographic studies, and participation in multidisciplinary rounds.  Donnice JONELLE Beals, MD Pulmonary, Critical Care Medicine [facility]  7a-7p: For contact information, see AMION. If no response, call PCCM Consults pager. 7p-7a: Call E-Link.       [1]  Allergies Allergen Reactions   Meperidine And Related Shortness Of  Breath and Other (See Comments)    Allergic, per MAR   Cefazolin Other (See Comments)    Allergic, per [facility]   "

## 2024-10-24 NOTE — Progress Notes (Signed)
 " PROGRESS NOTE    Kim Jenkins  FMW:[id number] DOB: [birth year] DOA: 10/20/2024 PCP: Fleeta Valeria Mayo, MD  Subjective: No new subjective & objective note has been filed under this hospital service since the last note was generated.    Hospital Course: No notes on file   Assessment and Plan:   Sepsis due to UTI            -chronic trach/peg            -Ucx with e coli/kleb pneumoniae-both sensitive to meropenem .             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             -Bcx sterile to date  - Zosyn  10/21/2024-10/24/24  - Switch to meropenem  as the E. coli is intermediate to Zosyn .  2.    OSA/OHS  - Trach and currently on the vent  3.    Bipolar d/o  - Continue usual meds Depakene , also on Effexor  and Prozac  4.    DMII  - Sliding scale insulin  and hold home meds     DVT prophylaxis: SCDs Start: 10/20/24 2006     Code Status: Full Code Family Communication: No family at the bedside Disposition Plan: To long-term care at [facility] Reason for continuing need for hospitalization: Follow-up cultures  Objective: Vitals:   10/24/24 1715 10/24/24 1730 10/24/24 1745 10/24/24 1800  BP:      Pulse: 88 89 80 80  Resp: (!) 21 19 19 19   Temp: 98.2 F (36.8 C) 98.1 F (36.7 C) 98.2 F (36.8 C) 97.9 F (36.6 C)  TempSrc:      SpO2: 96% 95% 95% 95%  Weight:      Height:        Intake/Output Summary (Last 24 hours) at 10/24/2024 1816 Last data filed at 10/24/2024 1800 Gross per 24 hour  Intake 2232.02 ml  Output 4250 ml  Net -2017.98 ml   Filed Weights   10/21/24 0436 10/22/24 0449 10/23/24 0500  Weight: (!) 173.2 kg (!) 173.2 kg (!) 172 kg    Examination:  Morbidly obese with trach on vent HEENT: No scleral icterus unable to appreciate JVD, does not track reliably with eyes, trach is clean dry and intact CV: Regular rate and rhythm distant heart tones no murmurs rubs gallop Lungs: Poor air entry no adventitial sounds Abdomen: Morbidly  obese but soft Extremities: Chronic appearing lymphedema Neurologic: Intermittently will engage with the examiner she even mouth a few words prefers to sleep during my exam Data Reviewed: I have personally reviewed following labs and imaging studies  CBC: Recent Labs  Lab 10/20/24 1722 10/21/24 0428 10/22/24 0444 10/23/24 0830 10/24/24 0146  WBC 15.4* 20.7* 12.0* 11.5* 9.2  NEUTROABS 10.9* 18.6* 9.3* 8.6* 6.0  HGB 11.6* 12.4 11.4* 10.8* 8.9*  HCT 38.2 41.2 36.6 35.8* 29.6*  MCV 99.2 100.5* 99.2 100.3* 102.1*  PLT 252 353 238 180 113*   Basic Metabolic Panel: Recent Labs  Lab 10/21/24 0428 10/21/24 1838 10/22/24 0444 10/22/24 2248 10/23/24 0830 10/24/24 0146 10/24/24 0450  NA 136  --  135 136 137 138  --   K 3.2*  --  3.7 3.1* 3.6 2.7*  --   CL 98  --  98 97* 98 98  --   CO2 28  --  31 31 32 33*  --   GLUCOSE 183*  --  148* 144* 169* 155*  --   BUN  19  --  23* 29* 29* 29*  --   CREATININE 0.57  --  0.74 0.77 0.76 0.64  --   CALCIUM  7.6*  --  7.9* 7.7* 7.9* 7.8*  --   MG  --  1.7 1.8 1.7 2.0  --  2.1  PHOS  --  4.4 4.2  --  3.3  --  2.2*   GFR: Estimated Creatinine Clearance: 123.3 mL/min (by C-G formula based on SCr of 0.64 mg/dL). Liver Function Tests: Recent Labs  Lab 10/20/24 1722 10/21/24 0428 10/22/24 0444 10/23/24 0830 10/24/24 0146  AST 33 31 27 32 24  ALT 10 11 8 13 10   ALKPHOS 231* 238* 226* 252* 180*  BILITOT 0.5 0.5 0.4 0.4 0.3  PROT 5.8* 6.3* 6.1* 6.0* 5.4*  ALBUMIN  <1.5* 1.5* <1.5* 1.6* 1.9*   No results for input(s): LIPASE, AMYLASE in the last 168 hours. No results for input(s): AMMONIA in the last 168 hours. Coagulation Profile: Recent Labs  Lab 10/20/24 1759  INR 1.4*   Cardiac Enzymes: No results for input(s): CKTOTAL, CKMB, CKMBINDEX, TROPONINI in the last 168 hours. ProBNP, BNP (last 5 results) Recent Labs    10/21/24 0428  PROBNP 5,751.0*   HbA1C: Recent Labs    10/23/24 2247  HGBA1C 4.9   CBG: Recent  Labs  Lab 10/23/24 2330 10/24/24 0414 10/24/24 0811 10/24/24 1129 10/24/24 1649  GLUCAP 139* 145* 146* 153* 153*   Lipid Profile: No results for input(s): CHOL, HDL, LDLCALC, TRIG, CHOLHDL, LDLDIRECT in the last 72 hours. Thyroid Function Tests: No results for input(s): TSH, T4TOTAL, FREET4, T3FREE, THYROIDAB in the last 72 hours. Anemia Panel: No results for input(s): VITAMINB12, FOLATE, FERRITIN, TIBC, IRON, RETICCTPCT in the last 72 hours. Sepsis Labs: Recent Labs  Lab 10/20/24 1758 10/20/24 1928 10/21/24 0428 10/24/24 0146  PROCALCITON  --   --  0.47  --   LATICACIDVEN 1.8 2.1* 2.1* 1.3    Recent Results (from the past 240 hours)  Resp panel by RT-PCR (RSV, Flu A&B, Covid) Urine, Catheterized     Status: None   Collection Time: 10/20/24  5:59 PM   Specimen: Urine, Catheterized; Nasal Swab  Result Value Ref Range Status   SARS Coronavirus 2 by RT PCR NEGATIVE NEGATIVE Final    Comment: (NOTE) SARS-CoV-2 target nucleic acids are NOT DETECTED.  The SARS-CoV-2 RNA is generally detectable in upper respiratory specimens during the acute phase of infection. The lowest concentration of SARS-CoV-2 viral copies this assay can detect is 138 copies/mL. A negative result does not preclude SARS-Cov-2 infection and should not be used as the sole basis for treatment or other patient management decisions. A negative result may occur with  improper specimen collection/handling, submission of specimen other than nasopharyngeal swab, presence of viral mutation(s) within the areas targeted by this assay, and inadequate number of viral copies(<138 copies/mL). A negative result must be combined with clinical observations, patient history, and epidemiological information. The expected result is Negative.  Fact Sheet for Patients:  bloggercourse.com  Fact Sheet for Healthcare Providers:   seriousbroker.it  This test is no t yet approved or cleared by the [place]  FDA and  has been authorized for detection and/or diagnosis of SARS-CoV-2 by FDA under an Emergency Use Authorization (EUA). This EUA will remain  in effect (meaning this test can be used) for the duration of the COVID-19 declaration under Section 564(b)(1) of the Act, 21 U.S.C.section 360bbb-3(b)(1), unless the authorization is terminated  or revoked sooner.  Influenza A by PCR NEGATIVE NEGATIVE Final   Influenza B by PCR NEGATIVE NEGATIVE Final    Comment: (NOTE) The Xpert Xpress SARS-CoV-2/FLU/RSV plus assay is intended as an aid in the diagnosis of influenza from Nasopharyngeal swab specimens and should not be used as a sole basis for treatment. Nasal washings and aspirates are unacceptable for Xpert Xpress SARS-CoV-2/FLU/RSV testing.  Fact Sheet for Patients: bloggercourse.com  Fact Sheet for Healthcare Providers: seriousbroker.it  This test is not yet approved or cleared by the [place]  FDA and has been authorized for detection and/or diagnosis of SARS-CoV-2 by FDA under an Emergency Use Authorization (EUA). This EUA will remain in effect (meaning this test can be used) for the duration of the COVID-19 declaration under Section 564(b)(1) of the Act, 21 U.S.C. section 360bbb-3(b)(1), unless the authorization is terminated or revoked.     Resp Syncytial Virus by PCR NEGATIVE NEGATIVE Final    Comment: (NOTE) Fact Sheet for Patients: bloggercourse.com  Fact Sheet for Healthcare Providers: seriousbroker.it  This test is not yet approved or cleared by the [place]  FDA and has been authorized for detection and/or diagnosis of SARS-CoV-2 by FDA under an Emergency Use Authorization (EUA). This EUA will remain in effect (meaning this test can be used) for  the duration of the COVID-19 declaration under Section 564(b)(1) of the Act, 21 U.S.C. section 360bbb-3(b)(1), unless the authorization is terminated or revoked.  Performed at [facility], [address] [place], [place] [address]   Urine Culture     Status: Abnormal   Collection Time: 10/20/24  5:59 PM   Specimen: Urine, Random  Result Value Ref Range Status   Specimen Description   Final    URINE, RANDOM Performed at [facility], [address], [place], [place] [address]    Special Requests   Final    NONE Reflexed from [id number] Performed at [facility], [address], [place], [place] [address]    Culture (A)  Final    >=100,000 COLONIES/mL KLEBSIELLA PNEUMONIAE Confirmed Extended Spectrum Beta-Lactamase Producer (ESBL).  In bloodstream infections from ESBL organisms, carbapenems are preferred over piperacillin /tazobactam. They are shown to have a lower risk of mortality. 20,000 COLONIES/mL ESCHERICHIA COLI CESBL BOTH ORGANISMS Performed at [facility], [address], [place], [place] [address]    Report Status 10/23/2024 FINAL  Final   Organism ID, Bacteria KLEBSIELLA PNEUMONIAE (A)  Final   Organism ID, Bacteria ESCHERICHIA COLI (A)  Final      Susceptibility   Escherichia coli - MIC*    AMPICILLIN >=32 RESISTANT Resistant     CEFAZOLIN (URINE) Value in next row Resistant      >=32 RESISTANTThis is a modified FDA-approved test that has been validated and its performance characteristics determined by the reporting laboratory.  This laboratory is certified under the Clinical Laboratory Improvement Amendments CLIA as qualified to perform high complexity clinical laboratory testing.    CEFEPIME  Value in next row Resistant      >=32 RESISTANTThis is a modified FDA-approved test that has been validated and its performance characteristics determined by the reporting laboratory.  This laboratory is certified under  the Clinical Laboratory Improvement Amendments CLIA as qualified to perform high complexity clinical laboratory testing.    ERTAPENEM Value in next row Sensitive      >=32 RESISTANTThis is a modified FDA-approved test that has been validated and its performance characteristics determined by the reporting laboratory.  This laboratory is certified  under the Clinical Laboratory Improvement Amendments CLIA as qualified to perform high complexity clinical laboratory testing.    CEFTRIAXONE  Value in next row Resistant      >=32 RESISTANTThis is a modified FDA-approved test that has been validated and its performance characteristics determined by the reporting laboratory.  This laboratory is certified under the Clinical Laboratory Improvement Amendments CLIA as qualified to perform high complexity clinical laboratory testing.    CIPROFLOXACIN Value in next row Intermediate      >=32 RESISTANTThis is a modified FDA-approved test that has been validated and its performance characteristics determined by the reporting laboratory.  This laboratory is certified under the Clinical Laboratory Improvement Amendments CLIA as qualified to perform high complexity clinical laboratory testing.    GENTAMICIN Value in next row Sensitive      >=32 RESISTANTThis is a modified FDA-approved test that has been validated and its performance characteristics determined by the reporting laboratory.  This laboratory is certified under the Clinical Laboratory Improvement Amendments CLIA as qualified to perform high complexity clinical laboratory testing.    NITROFURANTOIN Value in next row Sensitive      >=32 RESISTANTThis is a modified FDA-approved test that has been validated and its performance characteristics determined by the reporting laboratory.  This laboratory is certified under the Clinical Laboratory Improvement Amendments CLIA as qualified to perform high complexity clinical laboratory testing.    TRIMETH/SULFA Value in next  row Sensitive      >=32 RESISTANTThis is a modified FDA-approved test that has been validated and its performance characteristics determined by the reporting laboratory.  This laboratory is certified under the Clinical Laboratory Improvement Amendments CLIA as qualified to perform high complexity clinical laboratory testing.    AMPICILLIN/SULBACTAM Value in next row Resistant      >=32 RESISTANTThis is a modified FDA-approved test that has been validated and its performance characteristics determined by the reporting laboratory.  This laboratory is certified under the Clinical Laboratory Improvement Amendments CLIA as qualified to perform high complexity clinical laboratory testing.    PIP/TAZO Value in next row Intermediate      64 INTERMEDIATEThis is a modified FDA-approved test that has been validated and its performance characteristics determined by the reporting laboratory.  This laboratory is certified under the Clinical Laboratory Improvement Amendments CLIA as qualified to perform high complexity clinical laboratory testing.    MEROPENEM  Value in next row Sensitive      64 INTERMEDIATEThis is a modified FDA-approved test that has been validated and its performance characteristics determined by the reporting laboratory.  This laboratory is certified under the Clinical Laboratory Improvement Amendments CLIA as qualified to perform high complexity clinical laboratory testing.    * 20,000 COLONIES/mL ESCHERICHIA COLI   Klebsiella pneumoniae - MIC*    AMPICILLIN Value in next row Resistant      64 INTERMEDIATEThis is a modified FDA-approved test that has been validated and its performance characteristics determined by the reporting laboratory.  This laboratory is certified under the Clinical Laboratory Improvement Amendments CLIA as qualified to perform high complexity clinical laboratory testing.    CEFAZOLIN (URINE) Value in next row Resistant      >=32 RESISTANTThis is a modified FDA-approved test  that has been validated and its performance characteristics determined by the reporting laboratory.  This laboratory is certified under the Clinical Laboratory Improvement Amendments CLIA as qualified to perform high complexity clinical laboratory testing.    CEFEPIME  Value in next row Resistant      >=32 RESISTANTThis is  a modified FDA-approved test that has been validated and its performance characteristics determined by the reporting laboratory.  This laboratory is certified under the Clinical Laboratory Improvement Amendments CLIA as qualified to perform high complexity clinical laboratory testing.    ERTAPENEM Value in next row Sensitive      >=32 RESISTANTThis is a modified FDA-approved test that has been validated and its performance characteristics determined by the reporting laboratory.  This laboratory is certified under the Clinical Laboratory Improvement Amendments CLIA as qualified to perform high complexity clinical laboratory testing.    CEFTRIAXONE  Value in next row Resistant      >=32 RESISTANTThis is a modified FDA-approved test that has been validated and its performance characteristics determined by the reporting laboratory.  This laboratory is certified under the Clinical Laboratory Improvement Amendments CLIA as qualified to perform high complexity clinical laboratory testing.    CIPROFLOXACIN Value in next row Intermediate      >=32 RESISTANTThis is a modified FDA-approved test that has been validated and its performance characteristics determined by the reporting laboratory.  This laboratory is certified under the Clinical Laboratory Improvement Amendments CLIA as qualified to perform high complexity clinical laboratory testing.    GENTAMICIN Value in next row Sensitive      >=32 RESISTANTThis is a modified FDA-approved test that has been validated and its performance characteristics determined by the reporting laboratory.  This laboratory is certified under the Clinical Laboratory  Improvement Amendments CLIA as qualified to perform high complexity clinical laboratory testing.    NITROFURANTOIN Value in next row Intermediate      >=32 RESISTANTThis is a modified FDA-approved test that has been validated and its performance characteristics determined by the reporting laboratory.  This laboratory is certified under the Clinical Laboratory Improvement Amendments CLIA as qualified to perform high complexity clinical laboratory testing.    TRIMETH/SULFA Value in next row Sensitive      >=32 RESISTANTThis is a modified FDA-approved test that has been validated and its performance characteristics determined by the reporting laboratory.  This laboratory is certified under the Clinical Laboratory Improvement Amendments CLIA as qualified to perform high complexity clinical laboratory testing.    AMPICILLIN/SULBACTAM Value in next row Intermediate      >=32 RESISTANTThis is a modified FDA-approved test that has been validated and its performance characteristics determined by the reporting laboratory.  This laboratory is certified under the Clinical Laboratory Improvement Amendments CLIA as qualified to perform high complexity clinical laboratory testing.    PIP/TAZO Value in next row Sensitive      <=4 SENSITIVEThis is a modified FDA-approved test that has been validated and its performance characteristics determined by the reporting laboratory.  This laboratory is certified under the Clinical Laboratory Improvement Amendments CLIA as qualified to perform high complexity clinical laboratory testing.    MEROPENEM  Value in next row Sensitive      <=4 SENSITIVEThis is a modified FDA-approved test that has been validated and its performance characteristics determined by the reporting laboratory.  This laboratory is certified under the Clinical Laboratory Improvement Amendments CLIA as qualified to perform high complexity clinical laboratory testing.    * >=100,000 COLONIES/mL KLEBSIELLA PNEUMONIAE   Blood Culture (routine x 2)     Status: None (Preliminary result)   Collection Time: 10/20/24  6:19 PM   Specimen: BLOOD  Result Value Ref Range Status   Specimen Description   Final    BLOOD BLOOD LEFT WRIST Performed at [facility], [address] [place],  [place] [address]    Special Requests   Final    BOTTLES DRAWN AEROBIC ONLY Blood Culture results may not be optimal due to an inadequate volume of blood received in culture bottles Performed at [facility], [address] [place], [place] [address]    Culture   Final    NO GROWTH 4 DAYS Performed at [facility], [address] [place], [place] [address]    Report Status PENDING  Incomplete  MRSA Next Gen by PCR, Nasal     Status: Abnormal   Collection Time: 10/20/24 11:52 PM   Specimen: Nasal Mucosa; Nasal Swab  Result Value Ref Range Status   MRSA by PCR Next Gen DETECTED (A) NOT DETECTED Final    Comment: (NOTE) The GeneXpert MRSA Assay (FDA approved for NASAL specimens only), is one component of a comprehensive MRSA colonization surveillance program. It is not intended to diagnose MRSA infection nor to guide or monitor treatment for MRSA infections. Test performance is not FDA approved in patients less than 2 years old. Performed at [facility], [address] [place], [place] [address]   Culture, Respiratory w Gram Stain     Status: None   Collection Time: 10/21/24 12:53 AM   Specimen: Tracheal Aspirate  Result Value Ref Range Status   Specimen Description   Final    TRACHEAL ASPIRATE Performed at [facility], [address] [place], [place] [address]    Special Requests   Final    NONE Performed at [facility], [address] [place], [place] [address]    Gram Stain   Final    FEW WBC PRESENT, PREDOMINANTLY PMN FEW GRAM POSITIVE COCCI FEW GRAM POSITIVE RODS RARE GRAM NEGATIVE RODS Performed at  [facility], [address], [place], [place] [address]    Culture FEW PSEUDOMONAS AERUGINOSA  Final   Report Status 10/24/2024 FINAL  Final   Organism ID, Bacteria PSEUDOMONAS AERUGINOSA  Final      Susceptibility   Pseudomonas aeruginosa - MIC*    MEROPENEM  1 SENSITIVE Sensitive     CIPROFLOXACIN <=0.06 SENSITIVE Sensitive     IMIPENEM 2 SENSITIVE Sensitive     PIP/TAZO Value in next row Sensitive      <=4 SENSITIVEThis is a modified FDA-approved test that has been validated and its performance characteristics determined by the reporting laboratory.  This laboratory is certified under the Clinical Laboratory Improvement Amendments CLIA as qualified to perform high complexity clinical laboratory testing.    CEFEPIME  Value in next row Sensitive      <=4 SENSITIVEThis is a modified FDA-approved test that has been validated and its performance characteristics determined by the reporting laboratory.  This laboratory is certified under the Clinical Laboratory Improvement Amendments CLIA as qualified to perform high complexity clinical laboratory testing.    CEFTOLOZANE/TAZOBACTAM Value in next row Sensitive      <=4 SENSITIVEThis is a modified FDA-approved test that has been validated and its performance characteristics determined by the reporting laboratory.  This laboratory is certified under the Clinical Laboratory Improvement Amendments CLIA as qualified to perform high complexity clinical laboratory testing.    TOBRAMYCIN Value in next row Sensitive      <=4 SENSITIVEThis is a modified FDA-approved test that has been validated and its performance characteristics determined by the reporting laboratory.  This laboratory is certified under the Clinical Laboratory Improvement Amendments CLIA as qualified to perform high complexity clinical laboratory  testing.    CEFTAZIDIME Value in next row Sensitive      <=4 SENSITIVEThis is a modified FDA-approved test that has been validated and its  performance characteristics determined by the reporting laboratory.  This laboratory is certified under the Clinical Laboratory Improvement Amendments CLIA as qualified to perform high complexity clinical laboratory testing.    * FEW PSEUDOMONAS AERUGINOSA  Blood Culture (routine x 2)     Status: None (Preliminary result)   Collection Time: 10/21/24  9:35 AM   Specimen: BLOOD LEFT HAND  Result Value Ref Range Status   Specimen Description   Final    BLOOD LEFT HAND Performed at [facility], [address] [place], [place] [address]    Special Requests   Final    BOTTLES DRAWN AEROBIC ONLY Blood Culture results may not be optimal due to an inadequate volume of blood received in culture bottles Performed at [facility], [address] [place], [place] [address]    Culture   Final    NO GROWTH 3 DAYS Performed at [facility], [address], [place], [place] [address]    Report Status PENDING  Incomplete     Radiology Studies: DG CHEST PORT 1 VIEW Result Date: 10/24/2024 CLINICAL DATA:  Acute hypoxemic respiratory failure. EXAM: PORTABLE CHEST 1 VIEW COMPARISON:  10/20/2024 FINDINGS: Tracheostomy tube tip projects over the thoracic inlet. Weighted enteric tube below the diaphragm not included in the field of view. Worsening volume loss in the right hemithorax with increasing opacity in the mid lower lung zone. Suspected right pleural effusion. Increasing left lung base opacity which may represent a combination of pleural fluid and airspace disease. Stable heart size and mediastinal contours. No visible pneumothorax. IMPRESSION: 1. Worsening volume loss in the right hemithorax with increasing opacity in the mid and lower lung zone. Suspected right pleural effusion. 2. Increasing left lung base opacity which may represent a combination of pleural fluid and airspace disease. Electronically Signed   By: Andrea Gasman M.D.   On: 10/24/2024 15:19    Scheduled  Meds:  artificial tears  1 drop Both Eyes Q6H   aspirin   81 mg Per Tube Daily   Chlorhexidine  Gluconate Cloth  6 each Topical Daily   enoxaparin  (LOVENOX ) injection  80 mg Subcutaneous Q24H   famotidine   40 mg Per Tube QHS   FLUoxetine   20 mg Per Tube Daily   gabapentin   600 mg Per Tube Q12H   insulin  aspart  0-20 Units Subcutaneous Q4H   midodrine   15 mg Per Tube TID WC   multivitamin with minerals  1 tablet Per Tube Daily   mupirocin  ointment  1 Application Nasal BID   nutrition supplement (JUVEN)  1 packet Per Tube BID   mouth rinse  15 mL Mouth Rinse Q2H   valproic  acid  500 mg Per Tube BID   venlafaxine   37.5 mg Per Tube BID   Continuous Infusions:  feeding supplement (VITAL AF 1.2 CAL) 70 mL/hr at 10/24/24 1800   norepinephrine  (LEVOPHED ) Adult infusion 2 mcg/min (10/24/24 1800)   piperacillin -tazobactam (ZOSYN )  IV 12.5 mL/hr at 10/24/24 1800     LOS: 4 days   Time spent: 30 minutes  Lonni KANDICE Moose, MD  [facility]  10/24/2024, 6:16 PM   "

## 2024-10-24 NOTE — Progress Notes (Signed)
 eLink Physician-Brief Progress Note Patient Name: Kim Jenkins DOB: 1965/09/28 MRN: [id number]   Date of Service  10/24/2024  HPI/Events of Note  K 2.9, Cr 0.56 at 1743...no repletion ordered.  Has a central line & OGT  also asking for Gerhardt's butt cream order  eICU Interventions  - PO Kcl 40 x 2 and IV KCL 20meq x 1.  - cream ordered.      Intervention Category Intermediate Interventions: Electrolyte abnormality - evaluation and management Evaluation Type: Other  [name] 10/24/2024, 10:45 PM

## 2024-10-24 NOTE — Progress Notes (Signed)
" ° ° ° °  Patient Name: Kim Jenkins           DOB: [birth year]  MRN: [id number]      Admission Date: 10/20/2024  Attending Provider: Maranda Lonni MATSU, MD  Primary Diagnosis: Sepsis [facility])   Level of care: Stepdown   OVERNIGHT EVENT  Hypotensive- arterial BP 80-90/ 40's with MAP 50-60's. Manual BP cuff 70-80/40-50's MAP 50-60's  Chronic trach/vent patient from [facility] was admitted to the ICU with septic shock due to UTI  Pt was titrated off levo on 2/4. She has been receiving midodrine  15 mg TID.  She is lethargic, but arousal to painful stimulation. Pt is making urine, 500 cc's urine output by midnight.  She has been receiving lasix  due to anasarca, will d/c for now given low BP. All other vitals stable, afebrile.    Plan: Orders added for pt to receive additional 15 mg midodrine , IV albumin .  CBC, BMP, lactic  Addendum: No BP improvement 1 hr after interventions. 250 cc fluid bolus added.  Nursing staff to report back if arterial SBP <90 or MAP < 60  Lavanda Horns, DNP, [facility]    "
